# Patient Record
Sex: Female | Born: 1945 | Race: Black or African American | Hispanic: No | Marital: Married | Smoking: Never smoker
Health system: Southern US, Community
[De-identification: ages and names within clinical notes are randomized; demographics above are authoritative.]

## PROBLEM LIST (undated history)

## (undated) DIAGNOSIS — E785 Hyperlipidemia, unspecified: Secondary | ICD-10-CM

## (undated) DIAGNOSIS — Z90710 Acquired absence of both cervix and uterus: Secondary | ICD-10-CM

## (undated) DIAGNOSIS — E079 Disorder of thyroid, unspecified: Secondary | ICD-10-CM

## (undated) DIAGNOSIS — M1990 Unspecified osteoarthritis, unspecified site: Secondary | ICD-10-CM

## (undated) DIAGNOSIS — Z972 Presence of dental prosthetic device (complete) (partial): Secondary | ICD-10-CM

## (undated) DIAGNOSIS — I10 Essential (primary) hypertension: Secondary | ICD-10-CM

## (undated) HISTORY — DX: Acquired absence of both cervix and uterus: Z90.710

## (undated) HISTORY — DX: Disorder of thyroid, unspecified: E07.9

## (undated) HISTORY — DX: Essential (primary) hypertension: I10

## (undated) HISTORY — PX: ABDOMINAL HYSTERECTOMY: SHX81

## (undated) HISTORY — DX: Hyperlipidemia, unspecified: E78.5

---

## 2004-05-19 ENCOUNTER — Ambulatory Visit: Payer: Self-pay | Admitting: Unknown Physician Specialty

## 2005-05-20 ENCOUNTER — Ambulatory Visit: Payer: Self-pay | Admitting: Unknown Physician Specialty

## 2006-08-09 ENCOUNTER — Ambulatory Visit: Payer: Self-pay | Admitting: Unknown Physician Specialty

## 2007-09-21 ENCOUNTER — Ambulatory Visit: Payer: Self-pay | Admitting: Unknown Physician Specialty

## 2008-10-21 ENCOUNTER — Ambulatory Visit: Payer: Self-pay | Admitting: Unknown Physician Specialty

## 2009-09-24 ENCOUNTER — Ambulatory Visit: Payer: Self-pay | Admitting: Unknown Physician Specialty

## 2010-07-24 ENCOUNTER — Ambulatory Visit: Payer: Self-pay | Admitting: Unknown Physician Specialty

## 2010-08-24 ENCOUNTER — Other Ambulatory Visit: Payer: Self-pay | Admitting: Surgery

## 2010-08-24 DIAGNOSIS — E041 Nontoxic single thyroid nodule: Secondary | ICD-10-CM

## 2010-09-01 ENCOUNTER — Other Ambulatory Visit (HOSPITAL_COMMUNITY)
Admission: RE | Admit: 2010-09-01 | Discharge: 2010-09-01 | Disposition: A | Discharge status: Home / Self Care | Payer: Managed Care, Other (non HMO) | Source: Ambulatory Visit | Attending: Interventional Radiology | Admitting: Interventional Radiology

## 2010-09-01 ENCOUNTER — Ambulatory Visit
Admission: RE | Admit: 2010-09-01 | Discharge: 2010-09-01 | Disposition: A | Discharge status: Home / Self Care | Payer: Managed Care, Other (non HMO) | Source: Ambulatory Visit | Attending: Surgery | Admitting: Surgery

## 2010-09-01 ENCOUNTER — Other Ambulatory Visit: Payer: Self-pay | Admitting: Interventional Radiology

## 2010-09-01 DIAGNOSIS — E049 Nontoxic goiter, unspecified: Secondary | ICD-10-CM | POA: Insufficient documentation

## 2010-09-01 DIAGNOSIS — E041 Nontoxic single thyroid nodule: Secondary | ICD-10-CM

## 2010-09-21 ENCOUNTER — Ambulatory Visit (HOSPITAL_COMMUNITY)
Admission: RE | Admit: 2010-09-21 | Discharge: 2010-09-21 | Disposition: A | Discharge status: Home / Self Care | Payer: Managed Care, Other (non HMO) | Source: Ambulatory Visit | Attending: Surgery | Admitting: Surgery

## 2010-09-21 ENCOUNTER — Encounter (HOSPITAL_COMMUNITY): Payer: Managed Care, Other (non HMO)

## 2010-09-21 ENCOUNTER — Other Ambulatory Visit: Payer: Self-pay | Admitting: Surgery

## 2010-09-21 ENCOUNTER — Other Ambulatory Visit (HOSPITAL_COMMUNITY): Payer: Self-pay | Admitting: Surgery

## 2010-09-21 DIAGNOSIS — Z01818 Encounter for other preprocedural examination: Secondary | ICD-10-CM | POA: Insufficient documentation

## 2010-09-21 DIAGNOSIS — E041 Nontoxic single thyroid nodule: Secondary | ICD-10-CM

## 2010-09-21 DIAGNOSIS — Z01812 Encounter for preprocedural laboratory examination: Secondary | ICD-10-CM | POA: Insufficient documentation

## 2010-09-21 LAB — DIFFERENTIAL
Basophils Absolute: 0 10*3/uL (ref 0.0–0.1)
Basophils Absolute: 0 10*3/uL (ref 0.0–0.1)
Basophils Relative: 0 % (ref 0–1)
Basophils Relative: 0 % (ref 0–1)
Lymphocytes Relative: 36 % (ref 12–46)
Lymphocytes Relative: 36 % (ref 12–46)
Monocytes Absolute: 0.5 10*3/uL (ref 0.1–1.0)
Monocytes Absolute: 0.5 10*3/uL (ref 0.1–1.0)
Monocytes Relative: 8 % (ref 3–12)
Neutro Abs: 3.3 10*3/uL (ref 1.7–7.7)
Neutro Abs: 3.3 10*3/uL (ref 1.7–7.7)
Neutrophils Relative %: 55 % (ref 43–77)
Neutrophils Relative %: 55 % (ref 43–77)

## 2010-09-21 LAB — PROTIME-INR
INR: 1.01 (ref 0.00–1.49)
Prothrombin Time: 13.5 seconds (ref 11.6–15.2)

## 2010-09-21 LAB — URINALYSIS, ROUTINE W REFLEX MICROSCOPIC
Hgb urine dipstick: NEGATIVE
Nitrite: NEGATIVE
Specific Gravity, Urine: 1.02 (ref 1.005–1.030)
Urobilinogen, UA: 0.2 mg/dL (ref 0.0–1.0)
pH: 6.5 (ref 5.0–8.0)

## 2010-09-21 LAB — CBC
HCT: 42.5 % (ref 36.0–46.0)
Hemoglobin: 14.3 g/dL (ref 12.0–15.0)
MCHC: 33.6 g/dL (ref 30.0–36.0)
WBC: 6 10*3/uL (ref 4.0–10.5)

## 2010-09-21 LAB — BASIC METABOLIC PANEL
BUN: 10 mg/dL (ref 6–23)
Chloride: 103 mEq/L (ref 96–112)
Creatinine, Ser: 0.95 mg/dL (ref 0.4–1.2)
GFR calc Af Amer: >60 mL/min (ref >60)
GFR calc non Af Amer: 60 mL/min — ABNORMAL LOW (ref >60)

## 2010-09-25 ENCOUNTER — Ambulatory Visit (HOSPITAL_COMMUNITY)
Admission: RE | Admit: 2010-09-25 | Discharge: 2010-09-26 | Disposition: A | Discharge status: Home / Self Care | Payer: Managed Care, Other (non HMO) | Source: Ambulatory Visit | Attending: Surgery | Admitting: Surgery

## 2010-09-25 ENCOUNTER — Other Ambulatory Visit: Payer: Self-pay | Admitting: Surgery

## 2010-09-25 DIAGNOSIS — D34 Benign neoplasm of thyroid gland: Secondary | ICD-10-CM | POA: Insufficient documentation

## 2010-09-26 NOTE — Op Note (Signed)
Natasha Farrell, Natasha Farrell                ACCOUNT NO.:  [id number]  MEDICAL RECORD NO.:  [id number]           PATIENT TYPE:  O  LOCATION:  DAYL                         FACILITY:  [facility]  PHYSICIAN:  Velora Heckler, MD      DATE OF BIRTH:  01/10/1946  DATE OF PROCEDURE: 25 Sep 2010                               OPERATIVE REPORT   PREOPERATIVE DIAGNOSIS:  Right thyroid nodule.  POSTOPERATIVE DIAGNOSIS:  Right thyroid nodule.  PROCEDURE:  Right thyroid lobectomy.  SURGEON:  Velora Heckler, MD, FACS  ANESTHESIA:  General.  ESTIMATED BLOOD LOSS:  Minimal.  PREPARATION:  ChloraPrep.  COMPLICATIONS:  None.  INDICATIONS:  The patient is a 65 year old black female from [place], [place].  The patient had been diagnosed by Dr. Silver Huguenin with thyroid nodules.  There was a dominant right-sided thyroid nodule which, on ultrasound, measured 3.5 cm.  The patient underwent fine- needle aspiration biopsy with benign findings.  However, the patient notes mild compressive symptoms and would like to proceed with resection for definitive diagnosis and relief of compressive symptoms.  Therefore, the patient comes to the operating room for right thyroid lobectomy.  PROCEDURE IN DETAIL:  Procedure was done in OR #1 at the [facility].  The patient is brought to the operating room, placed in supine position on the operating room table.  Following administration of general anesthesia, the patient is positioned and then prepped and draped in usual strict aseptic fashion.  After ascertaining that an adequate level of anesthesia had been achieved, a Kocher incision was made with a #15 blade.  Dissection was carried through subcutaneous tissues and platysma.  Hemostasis was obtained with electrocautery.  Skin flaps were elevated cephalad and caudad with electrocautery used for hemostasis.  Mahorner self-retaining retractors placed for exposure.  Strap muscles were incised in  the midline.  Left thyroid lobe was palpated.  It appears grossly normal.  On palpation, there are no dominant nor discrete masses.  There is no lymphadenopathy.  Next, we turned our attention to the right side.  Strap muscles were reflected laterally exposing the right thyroid lobe.  Right lobe was moderately enlarged.  It contains a dominant nodule, occupying a large portion of the inferior and mid pole region of the gland.  It is gently mobilized.  Superior vessels were divided individually between medium Ligaclips with the harmonic scalpel.  Middle thyroid vein was divided between Ligaclips with the harmonic scalpel.  Inferior venous tributaries were divided between medium Ligaclips with the harmonic scalpel.  Gland is rolled anteriorly.  Parathyroid tissue was identified and preserved.  Branches of the inferior thyroid artery are divided between small Ligaclips with the harmonic scalpel.  Recurrent nerve was identified and preserved.  Ligament of Allyson Sabal was released with electrocautery and the gland was mobilized up and onto the anterior trachea.  Isthmus was mobilized across the midline.  There is no significant pyramidal lobe.  Thyroid parenchyma is transected with the harmonic scalpel at the junction of the isthmus and left thyroid lobe. Specimen is submitted to pathology for review, labeled right thyroid lobe.  Neck is irrigated with warm saline.  Good hemostasis was noted throughout.  Surgicel was placed in the operative field.  Strap muscles were reapproximated in the midline with interrupted 3-0 Vicryl sutures. Platysma was closed with interrupted 3-0 Vicryl sutures.  Skin was closed with running 4-0 Monocryl subcuticular suture.  Wound was washed and dried and Benzoin and Steri-Strips were applied.  Sterile dressings were applied.  The patient is awakened from anesthesia and brought to the recovery room.  The patient tolerated the procedure well.   Velora Heckler, MD,  FACS     TMG/MEDQ  D:  09/25/2010  T:  09/25/2010  Job:  [id number]  cc:   Dr. Silver Huguenin  Electronically Signed by Darnell Level MD on 09/26/2010 12:41:46 PM

## 2010-09-26 NOTE — Discharge Summary (Signed)
  Natasha Farrell, Natasha Farrell                ACCOUNT NO.:  [id number]  MEDICAL RECORD NO.:  [id number]           PATIENT TYPE:  O  LOCATION:  1527                         FACILITY:  [facility]  PHYSICIAN:  Velora Heckler, MD      DATE OF BIRTH:  14-Feb-1946  DATE OF ADMISSION:  09/25/2010 DATE OF DISCHARGE:  09/26/2010                              DISCHARGE SUMMARY   REASON FOR ADMISSION:  Right thyroid nodule.  BRIEF HISTORY:  The patient is a 66 year old black female from [place], [place].  The patient had been diagnosed by Dr. Rockie Neighbours with thyroid nodules.  Ultrasound showed a dominant right- sided nodule measuring 3.5 cm.  She was having mild compressive symptoms.  Fine-needle aspiration was benign.  The patient now comes to Surgery for right thyroid lobectomy.  HOSPITAL COURSE:  The patient was admitted on Sep 25, 2010, and taken directly to the operating room.  She underwent right thyroid lobectomy without complication.  Postoperative course was straightforward.  The patient had good pain control.  She was prepared for discharge home on the first postoperative day.  DISCHARGE/PLAN:  The patient is discharged home on Sep 26, 2010, in good condition, tolerating regular diet, and ambulating independently.  DISCHARGE MEDICATIONS:  Include Vicodin as needed for pain.  The patient will return to see me in the office at [facility] in 2-3 weeks.  FINAL DIAGNOSIS:  Right thyroid nodule, final pathologic results pending at the time of discharge.  CONDITION AT DISCHARGE:  Good.     Velora Heckler, MD     TMG/MEDQ  D:  09/26/2010  T:  09/26/2010  Job:  [id number]  cc:   Dr. Rockie Neighbours  Velora Heckler, MD [address] [place] [address]  Electronically Signed by Darnell Level MD on 09/26/2010 12:41:49 PM

## 2010-10-20 ENCOUNTER — Ambulatory Visit: Payer: Self-pay | Admitting: Unknown Physician Specialty

## 2010-11-03 ENCOUNTER — Other Ambulatory Visit (INDEPENDENT_AMBULATORY_CARE_PROVIDER_SITE_OTHER): Payer: Self-pay | Admitting: Surgery

## 2010-11-04 LAB — TSH: TSH: 6.538 u[IU]/mL — ABNORMAL HIGH (ref 0.350–4.500)

## 2010-12-09 ENCOUNTER — Encounter (INDEPENDENT_AMBULATORY_CARE_PROVIDER_SITE_OTHER): Payer: Self-pay | Admitting: Surgery

## 2010-12-09 ENCOUNTER — Ambulatory Visit (INDEPENDENT_AMBULATORY_CARE_PROVIDER_SITE_OTHER): Payer: Private Health Insurance - Indemnity | Admitting: Surgery

## 2010-12-09 DIAGNOSIS — E041 Nontoxic single thyroid nodule: Secondary | ICD-10-CM

## 2010-12-09 MED ORDER — SYNTHROID 50 MCG PO TABS: 50.0000 ug | ORAL_TABLET | Freq: Every day | ORAL | Status: DC

## 2010-12-09 NOTE — Progress Notes (Signed)
HISTORY: Patient is a 65 year old black female who underwent a thyroid lobectomy in May 2012. She has done well. A TSH level in June 2012 was slightly elevated at 6.538. She is not taking thyroid hormone supplementation.   PERTINENT REVIEW OF SYSTEMS: No complaints. Energy level is good. No dysphagia. Voice quality is normal.   EXAM: Neck incision is well healed with good cosmetic result. No sign of seromatous no sign of infection. Voice quality is normal.   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  Surgical hypothyroidism.   PLAN: Patient will be prescribed Synthroid 50 mcg daily. She will arrange for followup with her primary physician. She will need a repeat TSH level in approximately 6 weeks.  Otherwise the patient will continue to apply topical creams to her incision. She will return to see me as needed.

## 2011-10-26 ENCOUNTER — Ambulatory Visit: Payer: Self-pay | Admitting: Unknown Physician Specialty

## 2012-11-30 ENCOUNTER — Ambulatory Visit: Payer: Self-pay | Admitting: Unknown Physician Specialty

## 2013-12-03 ENCOUNTER — Ambulatory Visit: Payer: Self-pay | Admitting: Internal Medicine

## 2014-07-29 DIAGNOSIS — J3089 Other allergic rhinitis: Secondary | ICD-10-CM | POA: Insufficient documentation

## 2014-07-29 DIAGNOSIS — E890 Postprocedural hypothyroidism: Secondary | ICD-10-CM | POA: Insufficient documentation

## 2015-02-14 DIAGNOSIS — F5104 Psychophysiologic insomnia: Secondary | ICD-10-CM | POA: Insufficient documentation

## 2015-12-15 ENCOUNTER — Other Ambulatory Visit: Payer: Self-pay | Admitting: Internal Medicine

## 2015-12-15 DIAGNOSIS — Z1231 Encounter for screening mammogram for malignant neoplasm of breast: Secondary | ICD-10-CM

## 2015-12-30 ENCOUNTER — Ambulatory Visit
Admission: RE | Admit: 2015-12-30 | Discharge: 2015-12-30 | Disposition: A | Discharge status: Home / Self Care | Payer: Medicare Other | Source: Ambulatory Visit | Attending: Internal Medicine | Admitting: Internal Medicine

## 2015-12-30 ENCOUNTER — Other Ambulatory Visit: Payer: Self-pay | Admitting: Internal Medicine

## 2015-12-30 DIAGNOSIS — Z1231 Encounter for screening mammogram for malignant neoplasm of breast: Secondary | ICD-10-CM

## 2016-08-02 DIAGNOSIS — Z780 Asymptomatic menopausal state: Secondary | ICD-10-CM | POA: Insufficient documentation

## 2016-08-02 DIAGNOSIS — R7303 Prediabetes: Secondary | ICD-10-CM | POA: Insufficient documentation

## 2016-11-19 ENCOUNTER — Other Ambulatory Visit: Payer: Self-pay | Admitting: Internal Medicine

## 2016-11-19 DIAGNOSIS — Z1231 Encounter for screening mammogram for malignant neoplasm of breast: Secondary | ICD-10-CM

## 2016-12-30 ENCOUNTER — Ambulatory Visit
Admission: RE | Admit: 2016-12-30 | Discharge: 2016-12-30 | Disposition: A | Discharge status: Home / Self Care | Payer: Medicare HMO | Source: Ambulatory Visit | Attending: Internal Medicine | Admitting: Internal Medicine

## 2016-12-30 DIAGNOSIS — Z1231 Encounter for screening mammogram for malignant neoplasm of breast: Secondary | ICD-10-CM | POA: Diagnosis not present

## 2017-08-04 ENCOUNTER — Other Ambulatory Visit: Payer: Self-pay | Admitting: Internal Medicine

## 2017-08-04 DIAGNOSIS — Z1239 Encounter for other screening for malignant neoplasm of breast: Secondary | ICD-10-CM

## 2017-12-06 DIAGNOSIS — I10 Essential (primary) hypertension: Secondary | ICD-10-CM | POA: Insufficient documentation

## 2018-01-03 ENCOUNTER — Ambulatory Visit
Admission: RE | Admit: 2018-01-03 | Discharge: 2018-01-03 | Disposition: A | Discharge status: Home / Self Care | Payer: Medicare HMO | Source: Ambulatory Visit | Attending: Internal Medicine | Admitting: Internal Medicine

## 2018-01-03 DIAGNOSIS — Z1231 Encounter for screening mammogram for malignant neoplasm of breast: Secondary | ICD-10-CM | POA: Insufficient documentation

## 2018-01-03 DIAGNOSIS — Z1239 Encounter for other screening for malignant neoplasm of breast: Secondary | ICD-10-CM

## 2018-01-05 DIAGNOSIS — E269 Hyperaldosteronism, unspecified: Secondary | ICD-10-CM | POA: Insufficient documentation

## 2018-01-05 DIAGNOSIS — I152 Hypertension secondary to endocrine disorders: Secondary | ICD-10-CM | POA: Insufficient documentation

## 2018-01-11 ENCOUNTER — Other Ambulatory Visit: Payer: Self-pay | Admitting: Internal Medicine

## 2018-01-11 DIAGNOSIS — E269 Hyperaldosteronism, unspecified: Secondary | ICD-10-CM

## 2018-01-23 ENCOUNTER — Ambulatory Visit
Admission: RE | Admit: 2018-01-23 | Discharge: 2018-01-23 | Disposition: A | Discharge status: Home / Self Care | Payer: Medicare HMO | Source: Ambulatory Visit | Attending: Internal Medicine | Admitting: Internal Medicine

## 2018-01-23 DIAGNOSIS — E269 Hyperaldosteronism, unspecified: Secondary | ICD-10-CM | POA: Insufficient documentation

## 2018-01-23 DIAGNOSIS — N200 Calculus of kidney: Secondary | ICD-10-CM | POA: Diagnosis not present

## 2018-01-23 DIAGNOSIS — I700 Atherosclerosis of aorta: Secondary | ICD-10-CM | POA: Insufficient documentation

## 2018-08-07 ENCOUNTER — Other Ambulatory Visit: Payer: Self-pay | Admitting: Internal Medicine

## 2018-08-07 DIAGNOSIS — Z1231 Encounter for screening mammogram for malignant neoplasm of breast: Secondary | ICD-10-CM

## 2018-12-13 DIAGNOSIS — Z800 Family history of malignant neoplasm of digestive organs: Secondary | ICD-10-CM | POA: Insufficient documentation

## 2019-02-12 ENCOUNTER — Other Ambulatory Visit: Payer: Self-pay | Admitting: Physician Assistant

## 2019-02-12 DIAGNOSIS — Z1231 Encounter for screening mammogram for malignant neoplasm of breast: Secondary | ICD-10-CM

## 2019-03-27 ENCOUNTER — Ambulatory Visit
Admission: RE | Admit: 2019-03-27 | Discharge: 2019-03-27 | Disposition: A | Discharge status: Home / Self Care | Payer: Medicare HMO | Source: Ambulatory Visit | Attending: Physician Assistant | Admitting: Physician Assistant

## 2019-03-27 DIAGNOSIS — Z1231 Encounter for screening mammogram for malignant neoplasm of breast: Secondary | ICD-10-CM | POA: Diagnosis present

## 2020-02-06 ENCOUNTER — Other Ambulatory Visit: Payer: Self-pay | Admitting: Physician Assistant

## 2020-02-06 DIAGNOSIS — Z1231 Encounter for screening mammogram for malignant neoplasm of breast: Secondary | ICD-10-CM

## 2020-03-27 ENCOUNTER — Other Ambulatory Visit: Payer: Self-pay

## 2020-03-27 ENCOUNTER — Ambulatory Visit
Admission: RE | Admit: 2020-03-27 | Discharge: 2020-03-27 | Disposition: A | Discharge status: Home / Self Care | Payer: Medicare HMO | Source: Ambulatory Visit | Attending: Physician Assistant | Admitting: Physician Assistant

## 2020-03-27 DIAGNOSIS — Z1231 Encounter for screening mammogram for malignant neoplasm of breast: Secondary | ICD-10-CM | POA: Insufficient documentation

## 2020-04-03 ENCOUNTER — Other Ambulatory Visit: Payer: Self-pay | Admitting: Physician Assistant

## 2020-04-03 DIAGNOSIS — R928 Other abnormal and inconclusive findings on diagnostic imaging of breast: Secondary | ICD-10-CM

## 2020-04-07 ENCOUNTER — Other Ambulatory Visit: Payer: Self-pay

## 2020-04-07 ENCOUNTER — Ambulatory Visit
Admission: RE | Admit: 2020-04-07 | Discharge: 2020-04-07 | Disposition: A | Discharge status: Home / Self Care | Payer: Medicare HMO | Source: Ambulatory Visit | Attending: Physician Assistant | Admitting: Physician Assistant

## 2020-04-07 DIAGNOSIS — R928 Other abnormal and inconclusive findings on diagnostic imaging of breast: Secondary | ICD-10-CM | POA: Insufficient documentation

## 2020-04-15 ENCOUNTER — Other Ambulatory Visit: Payer: Self-pay | Admitting: Physician Assistant

## 2020-04-15 DIAGNOSIS — R928 Other abnormal and inconclusive findings on diagnostic imaging of breast: Secondary | ICD-10-CM

## 2020-04-21 ENCOUNTER — Ambulatory Visit
Admission: RE | Admit: 2020-04-21 | Discharge: 2020-04-21 | Disposition: A | Discharge status: Home / Self Care | Payer: Medicare HMO | Source: Ambulatory Visit | Attending: Physician Assistant | Admitting: Physician Assistant

## 2020-04-21 ENCOUNTER — Other Ambulatory Visit: Payer: Self-pay

## 2020-04-21 DIAGNOSIS — R928 Other abnormal and inconclusive findings on diagnostic imaging of breast: Secondary | ICD-10-CM | POA: Diagnosis present

## 2020-04-21 HISTORY — PX: BREAST BIOPSY: SHX20

## 2020-04-24 ENCOUNTER — Encounter: Payer: Self-pay | Admitting: Diagnostic Radiology

## 2020-04-24 LAB — SURGICAL PATHOLOGY

## 2020-05-24 DIAGNOSIS — C8290 Follicular lymphoma, unspecified, unspecified site: Secondary | ICD-10-CM

## 2020-05-24 HISTORY — DX: Follicular lymphoma, unspecified, unspecified site: C82.90

## 2020-07-21 ENCOUNTER — Emergency Department
Admission: EM | Admit: 2020-07-21 | Discharge: 2020-07-21 | Disposition: A | Discharge status: Home / Self Care | Payer: Medicare HMO | Attending: Emergency Medicine | Admitting: Emergency Medicine

## 2020-07-21 ENCOUNTER — Emergency Department: Payer: Medicare HMO

## 2020-07-21 ENCOUNTER — Other Ambulatory Visit: Payer: Self-pay

## 2020-07-21 DIAGNOSIS — Z79899 Other long term (current) drug therapy: Secondary | ICD-10-CM | POA: Diagnosis not present

## 2020-07-21 DIAGNOSIS — I10 Essential (primary) hypertension: Secondary | ICD-10-CM | POA: Insufficient documentation

## 2020-07-21 DIAGNOSIS — Z20822 Contact with and (suspected) exposure to covid-19: Secondary | ICD-10-CM | POA: Diagnosis not present

## 2020-07-21 DIAGNOSIS — R630 Anorexia: Secondary | ICD-10-CM | POA: Insufficient documentation

## 2020-07-21 DIAGNOSIS — R590 Localized enlarged lymph nodes: Secondary | ICD-10-CM

## 2020-07-21 DIAGNOSIS — E860 Dehydration: Secondary | ICD-10-CM | POA: Insufficient documentation

## 2020-07-21 DIAGNOSIS — R591 Generalized enlarged lymph nodes: Secondary | ICD-10-CM

## 2020-07-21 DIAGNOSIS — R531 Weakness: Secondary | ICD-10-CM

## 2020-07-21 DIAGNOSIS — R079 Chest pain, unspecified: Secondary | ICD-10-CM | POA: Diagnosis not present

## 2020-07-21 DIAGNOSIS — R000 Tachycardia, unspecified: Secondary | ICD-10-CM | POA: Insufficient documentation

## 2020-07-21 LAB — RESP PANEL BY RT-PCR (FLU A&B, COVID) ARPGX2
Influenza A by PCR: NEGATIVE
Influenza B by PCR: NEGATIVE
SARS Coronavirus 2 by RT PCR: NEGATIVE

## 2020-07-21 LAB — URINALYSIS, COMPLETE (UACMP) WITH MICROSCOPIC
Bacteria, UA: NONE SEEN
Bilirubin Urine: NEGATIVE
Glucose, UA: NEGATIVE mg/dL
Hgb urine dipstick: NEGATIVE
Ketones, ur: NEGATIVE mg/dL
Leukocytes,Ua: NEGATIVE
Nitrite: NEGATIVE
Protein, ur: 100 mg/dL — AB
Specific Gravity, Urine: 1.017 (ref 1.005–1.030)
pH: 5 (ref 5.0–8.0)

## 2020-07-21 LAB — BASIC METABOLIC PANEL
Anion gap: 12 (ref 5–15)
BUN: 9 mg/dL (ref 8–23)
CO2: 23 mmol/L (ref 22–32)
Calcium: 10.2 mg/dL (ref 8.9–10.3)
Chloride: 99 mmol/L (ref 98–111)
Creatinine, Ser: 0.93 mg/dL (ref 0.44–1.00)
GFR, Estimated: >60 mL/min (ref >60)
Glucose, Bld: 99 mg/dL (ref 70–99)
Potassium: 3.8 mmol/L (ref 3.5–5.1)
Sodium: 134 mmol/L — ABNORMAL LOW (ref 135–145)

## 2020-07-21 LAB — CBC
HCT: 38.1 % (ref 36.0–46.0)
Hemoglobin: 12.9 g/dL (ref 12.0–15.0)
MCH: 29.2 pg (ref 26.0–34.0)
MCHC: 33.9 g/dL (ref 30.0–36.0)
MCV: 86.2 fL (ref 80.0–100.0)
Platelets: 68 10*3/uL — ABNORMAL LOW (ref 150–400)
RBC: 4.42 MIL/uL (ref 3.87–5.11)
RDW: 15.1 % (ref 11.5–15.5)
WBC: 8.8 10*3/uL (ref 4.0–10.5)
nRBC: 0 % (ref 0.0–0.2)

## 2020-07-21 LAB — TROPONIN I (HIGH SENSITIVITY)
Troponin I (High Sensitivity): 5 ng/L (ref <18)
Troponin I (High Sensitivity): 5 ng/L (ref <18)

## 2020-07-21 LAB — T4, FREE: Free T4: 1.25 ng/dL — ABNORMAL HIGH (ref 0.61–1.12)

## 2020-07-21 MED ORDER — SODIUM CHLORIDE 0.9 % IV BOLUS
1000.0000 mL | Freq: Once | INTRAVENOUS | Status: AC
  Administered 2020-07-21: 1000 mL via INTRAVENOUS

## 2020-07-21 MED ORDER — IOHEXOL 350 MG/ML SOLN
75.0000 mL | Freq: Once | INTRAVENOUS | Status: AC | PRN
  Administered 2020-07-21: 75 mL via INTRAVENOUS

## 2020-07-21 NOTE — ED Provider Notes (Signed)
3:13 PM Assumed care for off going team.   Blood pressure (!) 143/73, pulse 97, temperature 98.3 F (36.8 C), temperature source Oral, resp. rate 18, height 5\' 4"  (1.626 m), weight 79.8 kg, SpO2 95 %.  See their HPI for full report but in brief patient came in with tachycardia pending CT PE.  If negative will discharge home  Dr. Cheri Fowler updated pt on CT results concerning for lymphoma and dc with onc follow up.        Vanessa Hamlet, MD 07/21/20 [phone]

## 2020-07-21 NOTE — Discharge Instructions (Addendum)
Your CT scan was as below.  Due to these findings you should follow-up with an oncologist.  Please call them to make an appointment to have this further worked up. Also please let your primary doctor know to help coordinate care with the oncology team.   Return to ER for fevers, shortness of breath or any other concerns     IMPRESSION:  1. Extensive lymphadenopathy throughout the neck, chest, and  visualized portions of the abdomen, along with marked splenomegaly.  Overall, findings favor leukemia or lymphoma.  2. No evidence of pulmonary embolus.  3. Small left pleural effusion.  4.  Aortic Atherosclerosis (ICD10-I70.0).

## 2020-07-21 NOTE — ED Notes (Signed)
IV access obtained, CT called

## 2020-07-21 NOTE — ED Notes (Signed)
IV catheter's removed intact without complication.  D/C instructions given.  Advised of follow up.  All questions addressed.  Understanding verbalized.  Pt left ER ambulatory with spouse.

## 2020-07-21 NOTE — ED Triage Notes (Signed)
Pt c/o generalized weakness and loss of appetite, states she feels full  For the past 3 days, states she does having some chest pain with exertion. Pt is in NAD at present

## 2020-07-21 NOTE — ED Provider Notes (Signed)
[facility] Emergency Department Provider Note   ____________________________________________   Event Date/Time   First MD Initiated Contact with Patient 07/21/20 1200     (approximate)  I have reviewed the triage vital signs and the nursing notes.   HISTORY  Chief Complaint Weakness    HPI [name] is a 75 y.o. female with medical history of hypertension, thyroid disease, and hyperlipidemia who presents for episodes of generalized weakness, palpitations, and diaphoresis that have been present over the last month.  Patient states that she has been evaluated by her primary care doctor who has only found evidence of sinus tachycardia on multiple visits.  This tachycardia was improved with IV hydration and therefore deemed to likely be dehydration as a cause of her episodic lightheadedness.  Patient was found to be orthostatic in the past as well.  Patient denies any past medical history of abnormal heart rhythms.  Patient denies any chest pains, palpitations, or diaphoresis at this time.  Patient currently denies any vision changes, tinnitus, difficulty speaking, facial droop, sore throat, chest pain, shortness of breath, abdominal pain, nausea/vomiting/diarrhea, dysuria, or weakness/numbness/paresthesias in any extremity         Past Medical History:  Diagnosis Date  . Hx of hysterectomy   . Hyperlipidemia   . Hypertension   . Thyroid disease    rt thryroidectomy  09/25/2010    Patient Active Problem List   Diagnosis Date Noted  . Thyroid nodule, uninodular 12/09/2010    Past Surgical History:  Procedure Laterality Date  . BREAST BIOPSY Right 04/21/2020   hydromark 3 [place] bx path pending    Prior to Admission medications   Medication Sig Start Date End Date Taking? Authorizing Provider  ezetimibe (ZETIA) 10 MG tablet Take 10 mg by mouth daily.      [provider]  lisinopril-hydrochlorothiazide (PRINZIDE,ZESTORETIC) 10-12.5 MG per  tablet Take 1 tablet by mouth daily.      [provider]  metoprolol (TOPROL-XL) 50 MG 24 hr tablet Take 50 mg by mouth daily.      [provider]  Multiple Vitamins-Minerals (MULTIVITAMIN WITH MINERALS) tablet Take 1 tablet by mouth daily.      [provider]  pravastatin (PRAVACHOL) 40 MG tablet Take 40 mg by mouth daily.      [provider]  SYNTHROID 50 MCG tablet Take 1 tablet (50 mcg total) by mouth daily. 12/09/10 12/09/11  Natasha Gemma, MD    Allergies Lisinopril  Family History  Problem Relation Age of Onset  . Breast cancer Sister 63    Social History Social History   Tobacco Use  . Smoking status: Never Smoker  . Smokeless tobacco: Never Used  Substance Use Topics  . Alcohol use: No  . Drug use: No    Review of Systems Constitutional: No fever/chills Eyes: No visual changes. ENT: No sore throat. Cardiovascular: Denies chest pain. Respiratory: Denies shortness of breath. Gastrointestinal: No abdominal pain.  No nausea, no vomiting.  No diarrhea. Genitourinary: Negative for dysuria. Musculoskeletal: Negative for acute arthralgias Skin: Negative for rash. Neurological: Negative for headaches, numbness/paresthesias in any extremity Psychiatric: Negative for suicidal ideation/homicidal ideation   ____________________________________________   PHYSICAL EXAM:  VITAL SIGNS: ED Triage Vitals [07/21/20 1108]  Enc Vitals Group     BP (!) 177/92     Pulse Rate (!) 135     Resp 18     Temp 98.3 F (36.8 C)     Temp Source Oral  SpO2 96 %     Weight 176 lb (79.8 kg)     Height 5\' 4"  (1.626 m)     Head Circumference      Peak Flow      Pain Score 0     Pain Loc      Pain Edu?      Excl. in [place]?    Constitutional: Alert and oriented. Well appearing overweight African-American female in no acute distress. Eyes: Conjunctivae are normal. PERRL. Head: Atraumatic. Nose: No congestion/rhinnorhea. Mouth/Throat: Mucous  membranes are moist. Neck: No stridor Cardiovascular: Grossly normal heart sounds.  Good peripheral circulation. Respiratory: Normal respiratory effort.  No retractions. Gastrointestinal: Soft and nontender. No distention. Musculoskeletal: No obvious deformities Neurologic:  Normal speech and language. No gross focal neurologic deficits are appreciated. Skin:  Skin is warm and dry. No rash noted. Psychiatric: Mood and affect are normal. Speech and behavior are normal.  ____________________________________________   LABS (all labs ordered are listed, but only abnormal results are displayed)  Labs Reviewed  BASIC METABOLIC PANEL - Abnormal; Notable for the following components:      Result Value   Sodium 134 (*)    All other components within normal limits  CBC - Abnormal; Notable for the following components:   Platelets 68 (*)    All other components within normal limits  URINALYSIS, COMPLETE (UACMP) WITH MICROSCOPIC - Abnormal; Notable for the following components:   Color, Urine YELLOW (*)    APPearance HAZY (*)    Protein, ur 100 (*)    All other components within normal limits  T4, FREE - Abnormal; Notable for the following components:   Free T4 1.25 (*)    All other components within normal limits  RESP PANEL BY RT-PCR (FLU A&B, COVID) ARPGX2  CBG MONITORING, ED  TROPONIN I (HIGH SENSITIVITY)  TROPONIN I (HIGH SENSITIVITY)   ____________________________________________  EKG  ED ECG REPORT I, Naaman Plummer, the attending physician, personally viewed and interpreted this ECG.  Date: 07/21/2020 EKG Time: 1059 Rate: 136 Rhythm: Tachycardic sinus rhythm QRS Axis: normal Intervals: normal ST/T Wave abnormalities: normal Narrative Interpretation: no evidence of acute ischemia  ____________________________________________  RADIOLOGY  ED MD interpretation: CT angiography of the chest shows multiple enlarged lymph nodes in the neck and chest  Official radiology  report(s): CT Angio Chest PE W/Cm &/Or Wo Cm  Result Date: 07/21/2020 CLINICAL DATA:  Generalized weakness, loss of appetite, chest pain with exertion EXAM: CT ANGIOGRAPHY CHEST WITH CONTRAST TECHNIQUE: Multidetector CT imaging of the chest was performed using the standard protocol during bolus administration of intravenous contrast. Multiplanar CT image reconstructions and MIPs were obtained to evaluate the vascular anatomy. CONTRAST:  59mL OMNIPAQUE IOHEXOL 350 MG/ML SOLN COMPARISON:  None. FINDINGS: Cardiovascular: This is a technically adequate evaluation of the pulmonary vasculature. There are no filling defects or pulmonary emboli. The heart is unremarkable without pericardial effusion. Unremarkable thoracic aorta without aneurysm or dissection. Minimal atherosclerosis. Mediastinum/Nodes: There is pathologic adenopathy within the bilateral jugular chains, mediastinum, bilateral axilla. Largest lymph node in the right axilla measures 21 x 16 mm image 24/4, and in the left axilla measures 30 x 15 mm image 11/4. Enlarged lymph node interposed between the brachiocephalic vein and left subclavian artery measures 21 x 16 mm reference image 13 of series 4. The thyroid, trachea, and esophagus are unremarkable. Lungs/Pleura: There is a small left pleural effusion. Minimal dependent atelectasis is seen within the lower lobes bilaterally, left greater than right. No  acute airspace disease or pneumothorax. The central airways are patent. Upper Abdomen: There is massive splenomegaly. Pathologic adenopathy is seen within the central upper mesentery and retroperitoneum, incompletely evaluated on this study. Epiphrenic adenopathy also noted anterior to the bilateral hemidiaphragms. Musculoskeletal: No acute or destructive bony lesions. Reconstructed images demonstrate no additional findings. Review of the MIP images confirms the above findings. IMPRESSION: 1. Extensive lymphadenopathy throughout the neck, chest, and  visualized portions of the abdomen, along with marked splenomegaly. Overall, findings favor leukemia or lymphoma. 2. No evidence of pulmonary embolus. 3. Small left pleural effusion. 4.  Aortic Atherosclerosis (ICD10-I70.0). Electronically Signed   By: Randa Ngo M.D.   On: 07/21/2020 15:25    ____________________________________________   PROCEDURES  Procedure(s) performed (including Critical Care):  .1-3 Lead EKG Interpretation Performed by: Naaman Plummer, MD Authorized by: Naaman Plummer, MD     Interpretation: normal     ECG rate:  98   ECG rate assessment: normal     Rhythm: sinus rhythm     Ectopy: none     Conduction: normal       ____________________________________________   INITIAL IMPRESSION / ASSESSMENT AND PLAN / ED COURSE  As part of my medical decision making, I reviewed the following data within the [facility] notes reviewed and incorporated, Labs reviewed, EKG interpreted, Old chart reviewed, Radiograph reviewed and Notes from prior ED visits reviewed and incorporated        This patient presents with generalized weakness and fatigue likely secondary to dehydration. Suspect acute kidney injury of prerenal origin. Doubt intrinsic renal dysfunction or obstructive nephropathy. Considered alternate etiologies of the patients symptoms including infectious processes, severe metabolic derangements or electrolyte abnormalities, ischemia/ACS, heart failure, and intracranial/central processes but think these are unlikely given the history and physical exam.  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 This was discussed with patient who stated that she had a lymph node biopsy performed in the right axilla within the last year and is scheduled for follow-up within this next week for multiple other lymph node biopsies.  The patient has been reexamined and is ready to be discharged.  All  diagnostic results have been reviewed and discussed with the patient/family.  Care plan has been outlined and the patient/family understands all current diagnoses, results, and treatment plans.  There are no new complaints, changes, or physical findings at this time.  All questions have been addressed and answered.  Patient was instructed to, and agrees to follow-up with their primary care physician as well as return to the emergency department if any new or worsening symptoms develop.      ____________________________________________   FINAL CLINICAL IMPRESSION(S) / ED DIAGNOSES  Final diagnoses:  Generalized weakness  Dehydration  Tachycardia  Lymphadenopathy  Thoracic lymphadenopathy     ED Discharge Orders    None       Note:  This document was prepared using Dragon voice recognition software and may include unintentional dictation errors.   Naaman Plummer, MD 07/22/20 1400

## 2020-07-23 ENCOUNTER — Ambulatory Visit: Payer: Medicare HMO | Admitting: Internal Medicine

## 2020-07-23 ENCOUNTER — Other Ambulatory Visit: Payer: Self-pay

## 2020-07-23 ENCOUNTER — Encounter: Payer: Self-pay | Admitting: Internal Medicine

## 2020-07-23 VITALS — BP 156/60 | HR 139 | Ht 64.0 in | Wt 175.0 lb

## 2020-07-23 DIAGNOSIS — R000 Tachycardia, unspecified: Secondary | ICD-10-CM | POA: Diagnosis not present

## 2020-07-23 DIAGNOSIS — R0602 Shortness of breath: Secondary | ICD-10-CM | POA: Diagnosis not present

## 2020-07-23 DIAGNOSIS — R1012 Left upper quadrant pain: Secondary | ICD-10-CM | POA: Insufficient documentation

## 2020-07-23 DIAGNOSIS — R079 Chest pain, unspecified: Secondary | ICD-10-CM

## 2020-07-23 DIAGNOSIS — I10 Essential (primary) hypertension: Secondary | ICD-10-CM

## 2020-07-23 MED ORDER — CARVEDILOL 12.5 MG PO TABS: 12.5000 mg | ORAL_TABLET | Freq: Two times a day (BID) | ORAL | 3 refills | Status: DC

## 2020-07-23 NOTE — Progress Notes (Signed)
New Outpatient Visit Date: 07/23/2020  Primary Care provider: Glendon Axe, MD No address on file  Chief Complaint: Elevated heart rate  HPI:  Ms. Balash is a 75 y.o. female who is being seen today for the evaluation of tachycardia. She has a history of hypertension, hyperlipidemia, and thyroid disease status post right thyroidectomy (2012). She presented to the [facility] emergency department for evaluation of tachycardia dating back to early January when she also complained of 4 days of headache, fevers, chills, cough, fatigue, night sweats, and dizziness. In the ED earlier this week, CT of the chest showed widespread lymphadenopathy in the neck, chest, and visualized portions of the upper abdomen along with marked splenomegaly. Findings were concerning for lymphoma or leukemia. EKG showed sinus tachycardia.  She was advised to follow-up with a cardiologist.  Today, Ms. Macioce reports that she has felt elevated heart rates, especially when she is activating or lying in bed at night, dating back to early January.  She frequently feels short of breath with mild activity, which became quite pronounced on September 06, 2022 (3 days ago) and led to her ED visit 2 days ago.  She frequently feels faint and lightheaded but has not passed out.  She also has occasional tightness in her chest especially when she moves around.  She notes having been placed on metoprolol over a year ago.  Her PCP had tried to escalate this up to 150 mg daily, though Ms. Focht ultimately asked that it be reduced back to 50 mg daily due to worsening fatigue.  She denies prior cardiac disease and testing.  She is scheduled for an ultrasound of her axilla tomorrow for work-up of axillary lymphadenopathy.  She is also scheduled for consultation with Dr. Mike Gip (oncology) next week.  --------------------------------------------------------------------------------------------------  Cardiovascular History & Procedures: Cardiovascular  Problems:  Sinus tachycardia  Risk Factors:  Hypertension, hyperlipidemia  Cath/PCI:  None  CV Surgery:  None  EP Procedures and Devices:  None  Non-Invasive Evaluation(s):  None  Recent CV Pertinent Labs: Lab Results  Component Value Date   INR 1.01 09/21/2010   K 3.8 07/21/2020   BUN 9 07/21/2020   CREATININE 0.93 07/21/2020    --------------------------------------------------------------------------------------------------  Past Medical History:  Diagnosis Date  . Hx of hysterectomy   . Hyperlipidemia   . Hypertension   . Thyroid disease    rt thryroidectomy  09/25/2010    Past Surgical History:  Procedure Laterality Date  . BREAST BIOPSY Right 04/21/2020   hydromark 3 [place] bx path pending    Current Meds  Medication Sig  . amLODipine (NORVASC) 10 MG tablet Take 1 tablet by mouth daily.  . cholecalciferol (VITAMIN D3) 25 MCG (1000 UNIT) tablet Take 2,000 Units by mouth daily.  . metoprolol (TOPROL-XL) 50 MG 24 hr tablet Take 50 mg by mouth daily.  . Multiple Vitamins-Minerals (MULTIVITAMIN WITH MINERALS) tablet Take 1 tablet by mouth daily.  . pravastatin (PRAVACHOL) 40 MG tablet Take 40 mg by mouth daily.  Marland Kitchen spironolactone (ALDACTONE) 25 MG tablet Take 25 mg by mouth daily.  Marland Kitchen SYNTHROID 50 MCG tablet Take 1 tablet (50 mcg total) by mouth daily.    Allergies: Lisinopril  Social History   Tobacco Use  . Smoking status: Never Smoker  . Smokeless tobacco: Never Used  Vaping Use  . Vaping Use: Never used  Substance Use Topics  . Alcohol use: No  . Drug use: No    Family History  Problem Relation Age of Onset  . Breast  cancer Sister 15    Review of Systems: Patient has experienced intermittent left upper quadrant pain since this morning.  Otherwise, a 12-system review of systems was performed and was negative except as noted in the  HPI.  --------------------------------------------------------------------------------------------------  Physical Exam: BP (!) 156/60 (BP Location: Right Arm, Patient Position: Sitting, Cuff Size: Normal)   Pulse (!) 139   Ht 5\' 4"  (1.626 m)   Wt 175 lb (79.4 kg)   SpO2 98%   BMI 30.04 kg/m   General: Anxious appearing woman, seated in the exam room. HEENT: No conjunctival pallor or scleral icterus. Facemask in place. Neck: Mild cervical lymphadenopathy noted.  No JVD or HJR.  No carotid bruit. Lungs: Mild increased work of breathing especially when talking.. Clear to auscultation bilaterally without wheezes or crackles. Heart: Tachycardic but regular rhythm without murmurs, rubs, or gallops. Abd: Bowel sounds present.  Soft with left upper quadrant tenderness.  Difficult to assess HSM due to tenderness and guarding. Ext: No lower extremity edema. Radial, PT, and DP pulses are 2+ bilaterally Skin: Warm and dry without rash. Neuro: CNIII-XII intact. Strength and fine-touch sensation intact in upper and lower extremities bilaterally. Psych: Normal mood and affect.  EKG: Sinus tachycardia (heart rate 139 bpm) with left atrial enlargement and nonspecific ST segment changes.  No significant change from prior tracing on 07/21/2020.  Lab Results  Component Value Date   WBC 8.8 07/21/2020   HGB 12.9 07/21/2020   HCT 38.1 07/21/2020   MCV 86.2 07/21/2020   PLT 68 (L) 07/21/2020    Lab Results  Component Value Date   NA 134 (L) 07/21/2020   K 3.8 07/21/2020   CL 99 07/21/2020   CO2 23 07/21/2020   BUN 9 07/21/2020   CREATININE 0.93 07/21/2020   GLUCOSE 99 07/21/2020   Lab Results  Component Value Date   TSH 6.538 (H) 11/03/2010   Free T4 (07/21/2020): 1.25  --------------------------------------------------------------------------------------------------  ASSESSMENT AND PLAN: Sinus tachycardia: Reviewing the patient's chart, she has had fairly significant tachycardia  dating back to her PCP visit in early January.  EKG today again shows sinus tachycardia with a heart rate of 139 bpm.  We discussed the nature of sinus tachycardia and the fact that it is likely a physiologic response to some other stressor.  I am most concerned that her suspected malignancy may be driving this.  She also endorses chest pain and shortness of breath that may be a manifestation of cardiomyopathy though she does not appear significantly volume overloaded.  Given her persistent tachycardia and increased work of breathing when speaking, I suggested that Ms. Dade go to the emergency department for further evaluation.  She declined.  We will arrange for expedited echocardiogram tomorrow.  We have also agreed to transition from metoprolol succinate 50 mg daily to carvedilol 12.5 mg twice daily, as she did not tolerate higher doses of metoprolol in the past due to marked fatigue.  Free T4 noted to be mildly elevated on 07/21/2020, though degree of tachycardia seems out of proportion to her free T4.  Chest pain and shortness of breath: I suspect the symptoms are driven primarily by likely underlying malignancy and persistent sinus tachycardia.  As above, we will obtain an echocardiogram tomorrow.  Barring any significant structural abnormalities, I would defer ischemia work-up until after further work-up and management of her lymphadenopathy and splenomegaly have been completed.  Abdominal pain: Left upper quadrant pain and tenderness noted today in the setting of significant splenomegaly on recent  CTA.  I advised Ms. Simoneaux to go to the ER if she has worsening pain.  Hypertension: Blood pressure suboptimally controlled today.  As above, we will transition from metoprolol to carvedilol.  No other medication changes at this time.  Follow-up: Return to clinic in 2 weeks.  Nelva Bush, MD 07/23/2020 2:11 PM

## 2020-07-23 NOTE — Patient Instructions (Addendum)
If you experience worsening shortness of breath, belly pain or fatigue, you should go to the emergency room.   Medication Instructions:  Your physician has recommended you make the following change in your medication:  1- STOP Metoprolol. 2- START Carvedilol 12.5 mg by  Mouth two times a day.  *If you need a refill on your cardiac medications before your next appointment, please call your pharmacy*  Lab Work: none If you have labs (blood work) drawn today and your tests are completely normal, you will receive your results only by: Marland Kitchen MyChart Message (if you have MyChart) OR . A paper copy in the mail If you have any lab test that is abnormal or we need to change your treatment, we will call you to review the results.   Testing/Procedures:  Scheduled for 07/24/2020.  Arrive at 12:30 at the [facility].   Your physician has requested that you have an echocardiogram. Echocardiography is a painless test that uses sound waves to create images of your heart. It provides your doctor with information about the size and shape of your heart and how well your heart's chambers and valves are working. This procedure takes approximately one hour. There are no restrictions for this procedure. There is a possibility that an IV may need to be started during your test to inject an image enhancing agent. This is done to obtain more optimal pictures of your heart. Therefore we ask that you do at least drink some water prior to coming in to hydrate your veins.    Follow-Up: At [facility], you and your health needs are our priority.  As part of our continuing mission to provide you with exceptional heart care, we have created designated Provider Care Teams.  These Care Teams include your primary Cardiologist (physician) and Advanced Practice Providers (APPs -  Physician Assistants and Nurse Practitioners) who all work together to provide you with the care you need, when you need it.  We recommend  signing up for the patient portal called "MyChart".  Sign up information is provided on this After Visit Summary.  MyChart is used to connect with patients for Virtual Visits (Telemedicine).  Patients are able to view lab/test results, encounter notes, upcoming appointments, etc.  Non-urgent messages can be sent to your provider as well.   To learn more about what you can do with MyChart, go to [email or web address].    Your next appointment:   2 week(s)  The format for your next appointment:   In Person  Provider:   You may see DR Harrell Gave END or one of the following Advanced Practice Providers on your designated Care Team:    Murray Hodgkins, NP  Christell Faith, PA-C  Marrianne Mood, PA-C  Cadence Bally, [place]  Laurann Montana, NP    Echocardiogram An echocardiogram is a test that uses sound waves (ultrasound) to produce images of the heart. Images from an echocardiogram can provide important information about:  Heart size and shape.  The size and thickness and movement of your heart's walls.  Heart muscle function and strength.  Heart valve function or if you have stenosis. Stenosis is when the heart valves are too narrow.  If blood is flowing backward through the heart valves (regurgitation).  A tumor or infectious growth around the heart valves.  Areas of heart muscle that are not working well because of poor blood flow or injury from a heart attack.  Aneurysm detection. An aneurysm is a weak or damaged part of an  artery wall. The wall bulges out from the normal force of blood pumping through the body. Tell a health care provider about:  Any allergies you have.  All medicines you are taking, including vitamins, herbs, eye drops, creams, and over-the-counter medicines.  Any blood disorders you have.  Any surgeries you have had.  Any medical conditions you have.  Whether you are pregnant or may be pregnant. What are the risks? Generally, this is a safe  test. However, problems may occur, including an allergic reaction to dye (contrast) that may be used during the test. What happens before the test? No specific preparation is needed. You may eat and drink normally. What happens during the test?  You will take off your clothes from the waist up and put on a hospital gown.  Electrodes or electrocardiogram (ECG)patches may be placed on your chest. The electrodes or patches are then connected to a device that monitors your heart rate and rhythm.  You will lie down on a table for an ultrasound exam. A gel will be applied to your chest to help sound waves pass through your skin.  A handheld device, called a transducer, will be pressed against your chest and moved over your heart. The transducer produces sound waves that travel to your heart and bounce back (or "echo" back) to the transducer. These sound waves will be captured in real-time and changed into images of your heart that can be viewed on a video monitor. The images will be recorded on a computer and reviewed by your health care provider.  You may be asked to change positions or hold your breath for a short time. This makes it easier to get different views or better views of your heart.  In some cases, you may receive contrast through an IV in one of your veins. This can improve the quality of the pictures from your heart. The procedure may vary among health care providers and hospitals.   What can I expect after the test? You may return to your normal, everyday life, including diet, activities, and medicines, unless your health care provider tells you not to do that. Follow these instructions at home:  It is up to you to get the results of your test. Ask your health care provider, or the department that is doing the test, when your results will be ready.  Keep all follow-up visits. This is important. Summary  An echocardiogram is a test that uses sound waves (ultrasound) to produce  images of the heart.  Images from an echocardiogram can provide important information about the size and shape of your heart, heart muscle function, heart valve function, and other possible heart problems.  You do not need to do anything to prepare before this test. You may eat and drink normally.  After the echocardiogram is completed, you may return to your normal, everyday life, unless your health care provider tells you not to do that. This information is not intended to replace advice given to you by your health care provider. Make sure you discuss any questions you have with your health care provider. Document Revised: 01/01/2020 Document Reviewed: 01/01/2020 Elsevier Patient Education  2021 [employer].

## 2020-07-24 ENCOUNTER — Ambulatory Visit
Admission: RE | Admit: 2020-07-24 | Discharge: 2020-07-24 | Disposition: A | Discharge status: Home / Self Care | Payer: Medicare HMO | Source: Ambulatory Visit | Attending: Physician Assistant | Admitting: Physician Assistant

## 2020-07-24 ENCOUNTER — Ambulatory Visit (HOSPITAL_BASED_OUTPATIENT_CLINIC_OR_DEPARTMENT_OTHER)
Admission: RE | Admit: 2020-07-24 | Discharge: 2020-07-24 | Disposition: A | Discharge status: Home / Self Care | Payer: Medicare HMO | Source: Ambulatory Visit | Attending: Internal Medicine | Admitting: Internal Medicine

## 2020-07-24 DIAGNOSIS — R928 Other abnormal and inconclusive findings on diagnostic imaging of breast: Secondary | ICD-10-CM | POA: Insufficient documentation

## 2020-07-24 DIAGNOSIS — R0602 Shortness of breath: Secondary | ICD-10-CM | POA: Insufficient documentation

## 2020-07-24 DIAGNOSIS — R000 Tachycardia, unspecified: Secondary | ICD-10-CM | POA: Insufficient documentation

## 2020-07-24 LAB — ECHOCARDIOGRAM COMPLETE
AR max vel: 3.34 cm2
AV Area VTI: 3.06 cm2
AV Area mean vel: 2.94 cm2
AV Mean grad: 4 mmHg
AV Peak grad: 5.4 mmHg
Ao pk vel: 1.16 m/s
Area-P 1/2: 5.62 cm2
S' Lateral: 2.77 cm

## 2020-07-24 NOTE — Progress Notes (Signed)
[facility]  [address] [place], [place] [address] Phone: [phone]  Fax: [phone]   Clinic Day:  07/28/2020  Referring physician: Naaman Plummer, MD  Chief Complaint: Natasha Farrell is a 75 y.o. female with lymphadenopathy who is referred in consultation by Dr Valora Piccolo for assessment and management.   HPI:  She was seen by Dr Paulita Cradle for follow-up on 03/20/2020.  She had received the COVID-19 Moderna vaccine in the left arm on 03/17/2020.  She was scheduled for a mammogram.  Screening mammogram on 03/27/2020 recommended further evaluation for possible enlarged bilateral axillary lymph nodes.  Bilateral breast ultrasound on 04/07/2020 revealed multiple cortically thickened lymph nodes within the right axilla (up to 5 mm) and multiple cortically thickened lymph nodes within the left axilla (up to 7 mm). Ultrasound-guided core needle biopsy of one of the thickened lymph nodes was recommended.  Right axilla lymph node ultrasound biopsy on 04/21/2020 revealed fragments of lymph node with reactive follicular hyperplasia. There was no definite evidence of malignancy. Flow cytometry noted a CD10+ B cell population. Clonality could not be evaluated due to non-specific light chain binding. There was no evidence of a T cell lymphoproliferative disorder.  She developed shortness of breath and had flu symptoms on 05/30/2020. She saw a NP and her heart rate was around 140 bpm. She had an EKG and CXR. COVID-19 was negative. She received fluids and was prescribed antibiotics x 7 days and prednisone. After she finished the medications, she described some days were good and some were bad. She describes sweats and episodes of presyncope.  She describes not feeling good on 07/21/2019. She got up to cook a meal and started to feel very hot and like she was going to pass out. She went to Urgent Care and was sent to the ER.  The patient was seen in the [facility]  ER on 07/21/2020. She reported generalized weakness, palpitations, and diaphoresis x 1 month. Chest CT angiogram revealed extensive lymphadenopathy throughout the neck, chest, and visualized portions of the abdomen, and massive splenomegaly.  Largest right axillary lymph node was 2.1 x 1.6 cm and left axillary lymph node 3.0 x 1.5 cm.  There were enlarged lymph nodes interposed between the brachiocephalic vein and left subclavian measuring 2.1 x 1.6 cm.  Overall, findings favored leukemia or lymphoma. There was no evidence of pulmonary embolus. There was a small left pleural effusion.   Bilateral breast ultrasound on 07/24/2020 revealed continued increase in bilateral axillary lymphadenopathy. Given systemic increase in adenopathy, this remained concerning for an underlying lymphoproliferative process.   Symptomatically, she notes drenching night sweats; she sometimes has to change her night gown and pillowcase. She has a cough and shortness of breath. Her stomach always feels full. She feels weak and fatigued. She has lost 6 lbs since these symptoms started in 05/2020. She denies after bath itching.  She is nauseous and her stools have been black this week. She is not taking iron or milk of magnesia. She reports urinary frequency. She has neck pain and pain under her sternum.  She denies fevers, headaches, changes in vision, runny nose, sore throat, chest pain, palpitations, vomiting, diarrhea, reflux, urinary symptoms, skin changes, numbness, weakness, balance or coordination problems, and bleeding of any kind.  Her father died from prostate cancer. Her sister had breast cancer. Another sister had colon cancer and kidney cancer.   Past Medical History:  Diagnosis Date  . Hx of hysterectomy   .  Hyperlipidemia   . Hypertension   . Thyroid disease    rt thryroidectomy  09/25/2010    Past Surgical History:  Procedure Laterality Date  . BREAST BIOPSY Right 04/21/2020   hydromark 3 [place] bx path  pending    Family History  Problem Relation Age of Onset  . Breast cancer Sister 23  . Colon cancer Sister   . Kidney cancer Sister     Social History:  reports that she has never smoked. She has never used smokeless tobacco. She reports that she does not drink alcohol and does not use drugs. She does not use alcohol or drugs. She denies exposure to radiation or toxins. She is a retired Librarian, academic. Her husband is Josph Macho. The patient is accompanied by Josph Macho today.  Allergies:  Allergies  Allergen Reactions  . Lisinopril Swelling    angioedema    Current Medications: Current Outpatient Medications  Medication Sig Dispense Refill  . amLODipine (NORVASC) 10 MG tablet Take 1 tablet by mouth daily.    . carvedilol (COREG) 12.5 MG tablet Take 1 tablet (12.5 mg total) by mouth 2 (two) times daily. 60 tablet 3  . cholecalciferol (VITAMIN D3) 25 MCG (1000 UNIT) tablet Take 2,000 Units by mouth daily.    . Multiple Vitamins-Minerals (MULTIVITAMIN WITH MINERALS) tablet Take 1 tablet by mouth daily.    . pravastatin (PRAVACHOL) 40 MG tablet Take 40 mg by mouth daily.    Marland Kitchen spironolactone (ALDACTONE) 25 MG tablet Take 25 mg by mouth daily.    Marland Kitchen SYNTHROID 50 MCG tablet Take 1 tablet (50 mcg total) by mouth daily. 30 tablet 3   No current facility-administered medications for this visit.    Review of Systems  Constitutional: Positive for diaphoresis, malaise/fatigue and weight loss (6 lbs since 05/2020). Negative for chills and fever.  HENT: Negative for congestion, ear discharge, ear pain, hearing loss, nosebleeds, sinus pain, sore throat and tinnitus.   Eyes: Negative for blurred vision.  Respiratory: Positive for cough and shortness of breath. Negative for hemoptysis and sputum production.   Cardiovascular: Negative for chest pain, palpitations and leg swelling.  Gastrointestinal: Positive for melena and nausea. Negative for abdominal pain, blood in stool, constipation, diarrhea, heartburn and  vomiting.       Early satiety.  Genitourinary: Positive for frequency. Negative for dysuria, hematuria and urgency.  Musculoskeletal: Positive for neck pain. Negative for back pain, joint pain and myalgias.       Pain under sternum  Skin: Negative for itching and rash.  Neurological: Positive for weakness (generalized). Negative for dizziness, tingling, sensory change and headaches.  Endo/Heme/Allergies: Does not bruise/bleed easily.  Psychiatric/Behavioral: Negative for depression and memory loss. The patient is not nervous/anxious and does not have insomnia.   All other systems reviewed and are negative.  Performance status (ECOG): 1  Vitals Blood pressure (!) 141/71, pulse (!) 103, temperature (!) 96.8 F (36 C), temperature source Tympanic, weight 175 lb (79.4 kg), SpO2 97 %.   Physical Exam Vitals and nursing note reviewed.  Constitutional:      General: She is not in acute distress.    Appearance: She is not diaphoretic.  HENT:     Head: Normocephalic and atraumatic.     Mouth/Throat:     Mouth: Mucous membranes are moist.     Pharynx: Oropharynx is clear.  Eyes:     General: No scleral icterus.    Extraocular Movements: Extraocular movements intact.     Conjunctiva/sclera: Conjunctivae normal.  Pupils: Pupils are equal, round, and reactive to light.  Cardiovascular:     Rate and Rhythm: Normal rate and regular rhythm.     Heart sounds: Normal heart sounds. No murmur heard.   Pulmonary:     Effort: Pulmonary effort is normal. No respiratory distress.     Breath sounds: No wheezing or rales.     Comments: Decreased breath sounds left lung base. Chest:     Chest wall: No tenderness.  Breasts:     Right: Axillary adenopathy (Multiple right axillary lymph nodes, largest is at least 2 cm) present. No supraclavicular adenopathy.     Left: Axillary adenopathy (Multiple left axillary lymph nodes, largest is at least 3-4 cm. Additional nodes 1-2 cm) present. No  supraclavicular adenopathy.    Abdominal:     General: Bowel sounds are normal. There is no distension.     Palpations: Abdomen is soft. There is splenomegaly. There is no hepatomegaly (liver edge palpable) or mass.     Tenderness: There is abdominal tenderness. There is no guarding or rebound.  Musculoskeletal:        General: No swelling or tenderness. Normal range of motion.     Cervical back: Normal range of motion and neck supple.  Lymphadenopathy:     Head:     Right side of head: No preauricular, posterior auricular or occipital adenopathy.     Left side of head: Preauricular (small) adenopathy present. No posterior auricular or occipital adenopathy.     Cervical: Cervical adenopathy (Right posterior low cervical lymph node. Cluster of low cervical lymph nodes on the left) present.     Upper Body:     Right upper body: Axillary adenopathy (Multiple right axillary lymph nodes, largest is at least 2 cm) present. No supraclavicular adenopathy.     Left upper body: Axillary adenopathy (Multiple left axillary lymph nodes, largest is at least 3-4 cm. Additional nodes 1-2 cm) present. No supraclavicular adenopathy.     Lower Body: No right inguinal adenopathy. Left inguinal adenopathy present.  Skin:    General: Skin is warm and dry.  Neurological:     Mental Status: She is alert and oriented to person, place, and time.  Psychiatric:        Behavior: Behavior normal.        Thought Content: Thought content normal.        Judgment: Judgment normal.    No visits with results within 3 Day(s) from this visit.  Latest known visit with results is:  Hospital Outpatient Visit on 07/24/2020  Component Date Value Ref Range Status  . Ao pk vel 07/24/2020 1.16  m/s Final  . AV Area VTI 07/24/2020 3.06  cm2 Final  . AR max vel 07/24/2020 3.34  cm2 Final  . AV Mean grad 07/24/2020 4.0  mmHg Final  . AV Peak grad 07/24/2020 5.4  mmHg Final  . S' Lateral 07/24/2020 2.77  cm Final  . AV Area  mean vel 07/24/2020 2.94  cm2 Final  . Area-P 1/2 07/24/2020 5.62  cm2 Final    Assessment:  LENER VENTRESCA is a 75 y.o. female with extensive adenopathy and massive splenomegaly c/w lymphoma.    Right axillary ultrasound guided biopsy on 04/21/2020 revealed fragments of lymph node with reactive follicular hyperplasia. There was no definite evidence of malignancy. Flow cytometry revealed a CD10+ B cell population. Clonality could not be evaluated due to non-specific light chain binding. There was no evidence of a T cell lymphoproliferative  disorder.  Chest CT angiogram on 07/21/2020 revealed extensive lymphadenopathy throughout the neck, chest, and visualized portions of the abdomen, along with marked splenomegaly.  Largest right axillary lymph node was 2.1 x 1.6 cm and left axillary lymph node 3.0 x 1.5 cm.  There were enlarged lymph nodes interposed between the brachiocephalic vein and left subclavian measuring 2.1 x 1.6 cm.  Overall, findings favored leukemia or lymphoma. There was no evidence of pulmonary embolus. There was a small left pleural effusion.   She has a family history of malignancy.  Her father died from prostate cancer. Her sister had breast cancer. Another sister had colon cancer and kidney cancer.  The patient received the COVID-19 vaccine in 06/2019 and 07/2019. The Moderna Booster was on 03/17/2020.   Symptomatically, she notes drenching night sweats, early satiety, and a 6 pound weight loss.  Exam reveals extensive adenopathy and splenomegaly.  Plan: 1.   Labs today:  CBC with diff, CMP, LDH, uric acid, PT, hepatitis B core antibody, hepatitis B surface antigen, hepatitis C antibody, HIV testing, G6PD assay.  2.   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  Initial biopsy on 04/21/2020 performed when lymph nodes small suggested a B cell population.  Clinically, she has significant adenopathy and splenomegaly associated  with B symptoms.  Discuss lymph node excisional biopsy for confirmation and classification.    Surgery contacted.  Discuss tumor lysis.  Check uric acid today.  Discuss PET scan to assess extent of disease.  Anticipate bone marrow aspirate and biopsy for staging.   Procedure discussed in detail.  Multiple questions asked and answered. 3.   Thrombocytopenia  Platelet count is 68,000.  Etiology likely secondary to splenomegaly or marrow involvement.  Anticipate bone marrow aspirate and biopsy. 4.   Family history of malignancy  Consider [facility] genetic testing at next visit. 5.   PET scan on 08/01/2020. 6.   Surgery referral (Dr Bary Castilla) re: lymph node biopsy. 7.   RTC after PET scan and lymph node biopsy for MD assessment and discussion regarding direction of therapy.  Addendum:  Uric acid was 9.3.  The patient was contacted.  Allopurinol was initiated.  A bone marrow aspirate and biopsy will be scheduled for 08/04/2020.  I discussed the assessment and treatment plan with the patient.  The patient was provided an opportunity to ask questions and all were answered.  The patient agreed with the plan and demonstrated an understanding of the instructions.  The patient was advised to call back if the symptoms worsen or if the condition fails to improve as anticipated.  I provided 47 minutes of face-to-face time during this this encounter and > 50% was spent counseling as documented under my assessment and plan. An additional 15 minutes were spent reviewing her chart ([facility] [place]) including notes, labs, and imaging studies and contacting surgery.    Britania Shreeve C. Mike Gip, MD, PhD    07/28/2020, 3:12 PM  I, Mirian Mo Tufford, am acting as Education administrator for [employer]. Mike Gip, MD, PhD.  I, Blanca [place] C. Mike Gip, MD, have reviewed the above documentation for accuracy and completeness, and I agree with the above.

## 2020-07-24 NOTE — Progress Notes (Signed)
*  PRELIMINARY RESULTS* Echocardiogram 2D Echocardiogram has been performed.  Sherrie Sport 07/24/2020, 1:33 PM

## 2020-07-26 DIAGNOSIS — R161 Splenomegaly, not elsewhere classified: Secondary | ICD-10-CM | POA: Insufficient documentation

## 2020-07-26 DIAGNOSIS — R599 Enlarged lymph nodes, unspecified: Secondary | ICD-10-CM | POA: Insufficient documentation

## 2020-07-28 ENCOUNTER — Other Ambulatory Visit: Payer: Self-pay

## 2020-07-28 ENCOUNTER — Encounter: Payer: Self-pay | Admitting: Hematology and Oncology

## 2020-07-28 ENCOUNTER — Inpatient Hospital Stay: Payer: Medicare HMO

## 2020-07-28 ENCOUNTER — Inpatient Hospital Stay: Payer: Medicare HMO | Attending: Hematology and Oncology | Admitting: Hematology and Oncology

## 2020-07-28 VITALS — BP 141/71 | HR 103 | Temp 96.8°F | Wt 175.0 lb

## 2020-07-28 DIAGNOSIS — E790 Hyperuricemia without signs of inflammatory arthritis and tophaceous disease: Secondary | ICD-10-CM

## 2020-07-28 DIAGNOSIS — R61 Generalized hyperhidrosis: Secondary | ICD-10-CM | POA: Diagnosis not present

## 2020-07-28 DIAGNOSIS — R350 Frequency of micturition: Secondary | ICD-10-CM | POA: Diagnosis not present

## 2020-07-28 DIAGNOSIS — E079 Disorder of thyroid, unspecified: Secondary | ICD-10-CM | POA: Diagnosis not present

## 2020-07-28 DIAGNOSIS — R161 Splenomegaly, not elsewhere classified: Secondary | ICD-10-CM | POA: Diagnosis not present

## 2020-07-28 DIAGNOSIS — J90 Pleural effusion, not elsewhere classified: Secondary | ICD-10-CM | POA: Diagnosis not present

## 2020-07-28 DIAGNOSIS — R5383 Other fatigue: Secondary | ICD-10-CM | POA: Insufficient documentation

## 2020-07-28 DIAGNOSIS — D696 Thrombocytopenia, unspecified: Secondary | ICD-10-CM | POA: Insufficient documentation

## 2020-07-28 DIAGNOSIS — I10 Essential (primary) hypertension: Secondary | ICD-10-CM

## 2020-07-28 DIAGNOSIS — R599 Enlarged lymph nodes, unspecified: Secondary | ICD-10-CM | POA: Diagnosis present

## 2020-07-28 DIAGNOSIS — R590 Localized enlarged lymph nodes: Secondary | ICD-10-CM

## 2020-07-28 LAB — COMPREHENSIVE METABOLIC PANEL
ALT: 15 U/L (ref 0–44)
AST: 34 U/L (ref 15–41)
Albumin: 4.5 g/dL (ref 3.5–5.0)
Alkaline Phosphatase: 96 U/L (ref 38–126)
Anion gap: 15 (ref 5–15)
BUN: 7 mg/dL — ABNORMAL LOW (ref 8–23)
CO2: 24 mmol/L (ref 22–32)
Calcium: 10.1 mg/dL (ref 8.9–10.3)
Chloride: 93 mmol/L — ABNORMAL LOW (ref 98–111)
Creatinine, Ser: 0.81 mg/dL (ref 0.44–1.00)
GFR, Estimated: >60 mL/min (ref >60)
Glucose, Bld: 100 mg/dL — ABNORMAL HIGH (ref 70–99)
Potassium: 3.5 mmol/L (ref 3.5–5.1)
Sodium: 132 mmol/L — ABNORMAL LOW (ref 135–145)
Total Bilirubin: 1.2 mg/dL (ref 0.3–1.2)
Total Protein: 9 g/dL — ABNORMAL HIGH (ref 6.5–8.1)

## 2020-07-28 LAB — CBC WITH DIFFERENTIAL/PLATELET
Abs Immature Granulocytes: 0.26 10*3/uL — ABNORMAL HIGH (ref 0.00–0.07)
Basophils Absolute: 0 10*3/uL (ref 0.0–0.1)
Basophils Relative: 1 %
Eosinophils Absolute: 0.2 10*3/uL (ref 0.0–0.5)
Eosinophils Relative: 2 %
HCT: 36.4 % (ref 36.0–46.0)
Hemoglobin: 12.3 g/dL (ref 12.0–15.0)
Immature Granulocytes: 3 %
Lymphocytes Relative: 19 %
Lymphs Abs: 1.6 10*3/uL (ref 0.7–4.0)
MCH: 29.1 pg (ref 26.0–34.0)
MCHC: 33.8 g/dL (ref 30.0–36.0)
MCV: 86.1 fL (ref 80.0–100.0)
Monocytes Absolute: 1.2 10*3/uL — ABNORMAL HIGH (ref 0.1–1.0)
Monocytes Relative: 14 %
Neutro Abs: 5.2 10*3/uL (ref 1.7–7.7)
Neutrophils Relative %: 61 %
Platelets: 63 10*3/uL — ABNORMAL LOW (ref 150–400)
RBC: 4.23 MIL/uL (ref 3.87–5.11)
RDW: 15.5 % (ref 11.5–15.5)
WBC: 8.5 10*3/uL (ref 4.0–10.5)
nRBC: 0 % (ref 0.0–0.2)

## 2020-07-28 LAB — HEPATITIS B SURFACE ANTIGEN: Hepatitis B Surface Ag: NONREACTIVE

## 2020-07-28 LAB — URIC ACID: Uric Acid, Serum: 9.3 mg/dL — ABNORMAL HIGH (ref 2.5–7.1)

## 2020-07-28 LAB — HEPATITIS B CORE ANTIBODY, TOTAL: Hep B Core Total Ab: NONREACTIVE

## 2020-07-28 LAB — HIV ANTIBODY (ROUTINE TESTING W REFLEX): HIV Screen 4th Generation wRfx: NONREACTIVE

## 2020-07-28 LAB — PROTIME-INR
INR: 1.2 (ref 0.8–1.2)
Prothrombin Time: 14.3 seconds (ref 11.4–15.2)

## 2020-07-28 LAB — LACTATE DEHYDROGENASE: LDH: 182 U/L (ref 98–192)

## 2020-07-28 LAB — HEPATITIS C ANTIBODY: HCV Ab: NONREACTIVE

## 2020-07-28 MED ORDER — ALLOPURINOL 300 MG PO TABS: 300.0000 mg | ORAL_TABLET | Freq: Every day | ORAL | 2 refills | Status: DC

## 2020-07-29 ENCOUNTER — Telehealth: Payer: Self-pay | Admitting: Hematology and Oncology

## 2020-07-29 ENCOUNTER — Other Ambulatory Visit: Payer: Self-pay | Admitting: General Surgery

## 2020-07-29 LAB — GLUCOSE 6 PHOSPHATE DEHYDROGENASE
G6PDH: 7.7 U/g{Hb} (ref 4.8–15.7)
Hemoglobin: 12.6 g/dL (ref 11.1–15.9)

## 2020-07-29 NOTE — Progress Notes (Signed)
Subjective:     Patient ID: Natasha Farrell is a 75 y.o. female.  HPI  The following portions of the patient's history were reviewed and updated as appropriate.  This a new patient is here today for: office visit. She is here to discuss having a left axillary node excision for lymphoma referred by Dr Mike Gip. She states she has had swollen nodes since last year and that they did a right axillary biopsy in November.  The patient reports about 6-7 pound weight loss over the last few months.  She notices early satiety as well as a decreased appetite.  She has had drenching night sweats for the last several months.   She is here with her husband of 58 years, Josph Macho.  (His sister is one of my old patients, Eliseo Squires).   Review of Systems  Constitutional: Negative for chills and fever.  Respiratory: Positive for shortness of breath. Negative for cough.        Chief Complaint  Patient presents with  . New Patient     BP 122/60   Pulse 95   Temp 36.4 C (97.6 F)   Ht 162.6 cm (5\' 4" )   Wt 78.9 kg (174 lb)   SpO2 96%   BMI 29.87 kg/m       Past Medical History:  Diagnosis Date  . DDD (degenerative disc disease), lumbar   . FH: colon cancer 12/13/2018  . History of hypercalcemia   . Hypercholesterolemia   . Hypertension   . Hypothyroid, unspecified   . Osteopenia           Past Surgical History:  Procedure Laterality Date  . APPENDECTOMY    . BREAST EXCISIONAL BIOPSY Right 04/21/2020  . COLONOSCOPY  07/04/2003   Dr. Ivor Messier @ [facility] [place]  . COLONOSCOPY  10/21/2008   Dr. Kennis Carina @ [facility] - Int. Hemorrhoids, [facility], rpt 5 yrs per RTE  . COLONOSCOPY  01/05/2019   Negative colon biopsy/FHx CC-Father/Repeat 82yrs/TKT  . FLEXIBLE SIGMOIDOSCOPY  06/10/1997   Dr. Ivor Messier @ [facility] - Hyperplatic Polyp  . HYSTERECTOMY    . LOBECTOMY PARTIAL THYROID Right 2012   Benign adenoma - Dr. Harlow Asa  . TUBAL LIGATION                 OB History    Gravida  2   Para  2   Term      Preterm      AB      Living        SAB      IAB      Ectopic      Molar      Multiple      Live Births          Obstetric Comments  Age at first period  53 Age of first pregnancy 54         Social History          Socioeconomic History  . Marital status: Married    Spouse name: Not on file  . Number of children: Not on file  . Years of education: Not on file  . Highest education level: Not on file  Occupational History  . Occupation: Retired    Comment: Arts development officer  . Smoking status: Never Smoker  . Smokeless tobacco: Never Used  Vaping Use  . Vaping Use: Never used  Substance and Sexual Activity  . Alcohol use: No  .  Drug use: No  . Sexual activity: Defer  Other Topics Concern  . Not on file  Social History Narrative  . Not on file   Social Determinants of Health   Financial Resource Strain: Not on file  Food Insecurity: Not on file  Transportation Needs: Not on file           Allergies  Allergen Reactions  . Lisinopril Swelling    Current Medications        Current Outpatient Medications  Medication Sig Dispense Refill  . amLODIPine (NORVASC) 10 MG tablet Take 1 tablet (10 mg total) by mouth once daily 90 tablet 1  . levothyroxine (SYNTHROID) 50 MCG tablet TAKE 1 TABLET (50 MCG TOTAL) BY MOUTH ONCE DAILY TAKE ON AN EMPTY STOMACH WITH A GLASS OF WATER AT LEAST 30-60 MINUTES BEFORE BREAKFAST. 90 tablet 1  . metoprolol succinate (TOPROL-XL) 100 MG XL tablet Take 1.5 tablets (150 mg total) by mouth once daily 135 tablet 1  . multivitamin with minerals tablet Take 1 tablet by mouth once daily      . pravastatin (PRAVACHOL) 40 MG tablet TAKE 1 TABLET BY MOUTH EVERY DAY 90 tablet 1  . predniSONE (DELTASONE) 20 MG tablet Take 2 tablets x4 days, then decrease to 1 tablet x4 days. (Patient not taking: Reported on 07/21/2020  ) 12 tablet 0  . spironolactone  (ALDACTONE) 50 MG tablet TAKE 1 TABLET BY MOUTH EVERY DAY 90 tablet 1   No current facility-administered medications for this visit.           Family History  Problem Relation Age of Onset  . Dementia Mother   . Myocardial Infarction (Heart attack) Mother   . High blood pressure (Hypertension) Father   . Diabetes type II Father   . Prostate cancer Father   . Breast cancer Sister   . Colon cancer Sister   . Kidney cancer Sister   . No Known Problems Son          Objective:   Physical Exam Exam conducted with a chaperone present.  Constitutional:      Appearance: Normal appearance.  Cardiovascular:     Rate and Rhythm: Normal rate and regular rhythm.     Pulses: Normal pulses.     Heart sounds: Normal heart sounds.  Pulmonary:     Effort: Pulmonary effort is normal.     Breath sounds: Normal breath sounds.  Chest:  Breasts:     Right: Axillary adenopathy present. No supraclavicular adenopathy.     Left: Axillary adenopathy present. No supraclavicular adenopathy.    Abdominal:     General: Bowel sounds are normal.     Palpations: There is hepatomegaly and splenomegaly.     Tenderness: There is abdominal tenderness in the left upper quadrant.       Comments: Tenderness with palpation over the spleen.   Musculoskeletal:     Cervical back: Neck supple.  Lymphadenopathy:     Cervical:     Right cervical: No superficial cervical adenopathy.    Left cervical: No superficial cervical adenopathy.     Upper Body:     Right upper body: Axillary adenopathy present. No supraclavicular adenopathy.     Left upper body: Axillary adenopathy present. No supraclavicular adenopathy.     Lower Body: Right inguinal adenopathy present. Left inguinal adenopathy present.  Skin:    General: Skin is warm and dry.  Neurological:     Mental Status: She is alert and oriented to person,  place, and time.  Psychiatric:        Mood and Affect: Mood normal.         Behavior: Behavior normal.     Labs and Radiology:   July 28, 2020 laboratory: WBC 4.0 - 10.5 K/uL 8.5   RBC 3.87 - 5.11 MIL/uL 4.23   Hemoglobin 12.0 - 15.0 g/dL 12.3   HCT 36.0 - 46.0 % 36.4   MCV 80.0 - 100.0 fL 86.1   MCH 26.0 - 34.0 pg 29.1   MCHC 30.0 - 36.0 g/dL 33.8   RDW 11.5 - 15.5 % 15.5   Platelets 150 - 400 K/uL 63Low   Comment: Immature Platelet Fraction may be  clinically indicated, consider  ordering this additional test  [id number]  CONSISTENT WITH PREVIOUS RESULT   nRBC 0.0 - 0.2 % 0.0   Neutrophils Relative % % 61   Neutro Abs 1.7 - 7.7 K/uL 5.2   Lymphocytes Relative % 19   Lymphs Abs 0.7 - 4.0 K/uL 1.6   Monocytes Relative % 14   Monocytes Absolute 0.1 - 1.0 K/uL 1.2High   Eosinophils Relative % 2   Eosinophils Absolute 0.0 - 0.5 K/uL 0.2   Basophils Relative % 1   Basophils Absolute 0.0 - 0.1 K/uL 0.0   Immature Granulocytes % 3   Abs Immature Granulocytes 0.00 - 0.07 K/uL 0.85UDJS   Sodium 135 - 145 mmol/L 132Low   Potassium 3.5 - 5.1 mmol/L 3.5   Chloride 98 - 111 mmol/L 93Low   CO2 22 - 32 mmol/L 24   Glucose, Bld 70 - 99 mg/dL 100High   Comment: Glucose reference range applies only to samples taken after fasting for at least 8 hours.  BUN 8 - 23 mg/dL 7Low   Creatinine, Ser 0.44 - 1.00 mg/dL 0.81   Calcium 8.9 - 10.3 mg/dL 10.1   Total Protein 6.5 - 8.1 g/dL 9.0High   Albumin 3.5 - 5.0 g/dL 4.5   AST 15 - 41 U/L 34   ALT 0 - 44 U/L 15   Alkaline Phosphatase 38 - 126 U/L 96   Total Bilirubin 0.3 - 1.2 mg/dL 1.2   GFR, Estimated >60 mL/min >60   Comment: (NOTE)     Ref Range & Units 1 d ago  LDH 98 - 192 U/L 182     Prothrombin Time 11.4 - 15.2 seconds 14.3   INR 0.8 - 1.2 1.2        Uric Acid, Serum 2.5 - 7.1 mg/dL 9.3High     CT angio July 21, 2020:   The study was reviewed and showed bilateral axillary adenopathy. Left pleural effusion.  Massive splenomegaly.   Bilateral axillary  ultrasound of July 24, 2020:  Bilateral adenopathy.     Assessment:     Suspected lymphoma with involvement both above and below the diaphragm.  Splenomegaly with resulting thrombocytopenia.  Elevated serum uric acid.  Mild hyponatremia.    Plan:     Indication for excisional biopsy of the lymph node was reviewed in detail.  While she shows both axillary and inguinal adenopathy, will biopsy the left axillary nodes to minimize inflammatory changes that at times can confuse analysis of the inguinal area.  The importance of adequate nutritional support during this time of decreased appetite and early satiety with nutritional supplements was reviewed.  After surgery, minimizing repetitive motion involving the left shoulder was encouraged.  Approximately 50 minutes was spent on prereview of the case yesterday with medical oncology and  patient assessment today.    Entered by Karie Fetch, RN, acting as a scribe for Dr. Hervey Ard, MD.    The documentation recorded by the scribe accurately reflects the service I personally performed and the decisions made by me.   Robert Bellow, MD FACS

## 2020-07-30 ENCOUNTER — Telehealth: Payer: Self-pay | Admitting: Hematology and Oncology

## 2020-07-30 ENCOUNTER — Other Ambulatory Visit: Payer: Self-pay

## 2020-07-30 ENCOUNTER — Other Ambulatory Visit
Admission: RE | Admit: 2020-07-30 | Discharge: 2020-07-30 | Disposition: A | Discharge status: Home / Self Care | Payer: Medicare HMO | Source: Ambulatory Visit | Attending: General Surgery | Admitting: General Surgery

## 2020-07-30 DIAGNOSIS — Z01818 Encounter for other preprocedural examination: Secondary | ICD-10-CM | POA: Insufficient documentation

## 2020-07-30 NOTE — Telephone Encounter (Signed)
07/30/2020 Spoke w/ pt and informed her that PET scan has been authorized and scheduled for 08/12/20. Reminded pt of address, arrival time, and NPO instructions. Also printed an updated AVS and mailed it to her. Pt confirmed appt.  SRW

## 2020-07-30 NOTE — Patient Instructions (Signed)
Your procedure is scheduled on: 08/01/2020 Report to the Registration Desk on the 1st floor of the [facility]. To find out your arrival time, please call [phone] between 1PM - 3PM on: 07/31/2020  REMEMBER: Instructions that are not followed completely may result in serious medical risk, up to and including death; or upon the discretion of your surgeon and anesthesiologist your surgery may need to be rescheduled.  Do not eat food after midnight the night before surgery.  No gum chewing, lozengers or hard candies.  You may however, drink CLEAR liquids up to 2 hours before you are scheduled to arrive for your surgery. Do not drink anything within 2 hours of your scheduled arrival time.  Clear liquids include: - water  - apple juice without pulp - gatorade (not RED, PURPLE, OR BLUE) - black coffee or tea (Do NOT add milk or creamers to the coffee or tea) Do NOT drink anything that is not on this list.  TAKE THESE MEDICATIONS THE MORNING OF SURGERY WITH A SIP OF WATER: - amLODipine (NORVASC) 10 MG tablet - carvedilol (COREG) 12.5 MG tablet - SYNTHROID 50 MCG tablet - allopurinol (ZYLOPRIM) 300 MG tablet  Follow recommendations from Cardiologist, Pulmonologist or PCP regarding stopping Aspirin, Coumadin, Plavix, Eliquis, Pradaxa, or Pletal.  One week prior to surgery: Stop Anti-inflammatories (NSAIDS) such as Advil, Aleve, Ibuprofen, Motrin, Naproxen, Naprosyn and Aspirin based products such as Excedrin, Goodys Powder, BC Powder.  Stop ANY OVER THE COUNTER supplements until after surgery.  No Alcohol for 24 hours before or after surgery.  No Smoking including e-cigarettes for 24 hours prior to surgery.  No chewable tobacco products for at least 6 hours prior to surgery.  No nicotine patches on the day of surgery.  Do not use any "recreational" drugs for at least a week prior to your surgery.  Please be advised that the combination of cocaine and anesthesia may have negative  outcomes, up to and including death. If you test positive for cocaine, your surgery will be cancelled.  On the morning of surgery brush your teeth with toothpaste and water, you may rinse your mouth with mouthwash if you wish. Do not swallow any toothpaste or mouthwash.  Do not wear jewelry, make-up, hairpins, clips or nail polish.  Do not wear lotions, powders, or perfumes.   Do not shave body from the neck down 48 hours prior to surgery just in case you cut yourself which could leave a site for infection.  Also, freshly shaved skin may become irritated if using the CHG soap.  Contact lenses, hearing aids and dentures may not be worn into surgery.  Do not bring valuables to the hospital. [facility] is not responsible for any missing/lost belongings or valuables.   Use CHG Soap or wipes as directed on instruction sheet.  Notify your doctor if there is any change in your medical condition (cold, fever, infection).  Wear comfortable clothing (specific to your surgery type) to the hospital.  Plan for stool softeners for home use; pain medications have a tendency to cause constipation. You can also help prevent constipation by eating foods high in fiber such as fruits and vegetables and drinking plenty of fluids as your diet allows.  After surgery, you can help prevent lung complications by doing breathing exercises.  Take deep breaths and cough every 1-2 hours. Your doctor may order a device called an Incentive Spirometer to help you take deep breaths. When coughing or sneezing, hold a pillow firmly against your  incision with both hands. This is called "splinting." Doing this helps protect your incision. It also decreases belly discomfort.  If you are being admitted to the hospital overnight, leave your suitcase in the car. After surgery it may be brought to your room.  If you are being discharged the day of surgery, you will not be allowed to drive home. You will need a responsible  adult (18 years or older) to drive you home and stay with you that night.   If you are taking public transportation, you will need to have a responsible adult (18 years or older) with you. Please confirm with your physician that it is acceptable to use public transportation.   Please call the [facility] at [phone] if you have any questions about these instructions.  Surgery Visitation Policy:  Patients undergoing a surgery or procedure may have one family member or support person with them as long as that person is not COVID-19 positive or experiencing its symptoms.  That person may remain in the waiting area during the procedure.  Inpatient Visitation:    Visiting hours are 7 a.m. to 8 p.m. Inpatients will be allowed two visitors daily. The visitors may change each day during the patient's stay. No visitors under the age of 1. Any visitor under the age of 72 must be accompanied by an adult. The visitor must pass COVID-19 screenings, use hand sanitizer when entering and exiting the patient's room and wear a mask at all times, including in the patient's room. Patients must also wear a mask when staff or their visitor are in the room. Masking is required regardless of vaccination status.

## 2020-07-31 ENCOUNTER — Other Ambulatory Visit
Admission: RE | Admit: 2020-07-31 | Discharge: 2020-07-31 | Disposition: A | Discharge status: Home / Self Care | Payer: Medicare HMO | Source: Ambulatory Visit | Attending: General Surgery | Admitting: General Surgery

## 2020-07-31 DIAGNOSIS — Z20822 Contact with and (suspected) exposure to covid-19: Secondary | ICD-10-CM | POA: Insufficient documentation

## 2020-07-31 DIAGNOSIS — Z01812 Encounter for preprocedural laboratory examination: Secondary | ICD-10-CM | POA: Diagnosis present

## 2020-07-31 LAB — SARS CORONAVIRUS 2 (TAT 6-24 HRS): SARS Coronavirus 2: NEGATIVE

## 2020-08-01 ENCOUNTER — Encounter: Payer: Self-pay | Admitting: General Surgery

## 2020-08-01 ENCOUNTER — Ambulatory Visit: Payer: Medicare HMO | Admitting: Anesthesiology

## 2020-08-01 ENCOUNTER — Ambulatory Visit
Admission: RE | Admit: 2020-08-01 | Discharge: 2020-08-01 | Disposition: A | Discharge status: Home / Self Care | Payer: Medicare HMO | Source: Ambulatory Visit | Attending: General Surgery | Admitting: General Surgery

## 2020-08-01 ENCOUNTER — Encounter
Admission: RE | Disposition: A | Discharge status: Home / Self Care | Payer: Self-pay | Source: Ambulatory Visit | Attending: General Surgery

## 2020-08-01 ENCOUNTER — Other Ambulatory Visit: Payer: Self-pay

## 2020-08-01 ENCOUNTER — Ambulatory Visit: Payer: Medicare HMO

## 2020-08-01 ENCOUNTER — Other Ambulatory Visit: Payer: Self-pay | Admitting: Student

## 2020-08-01 DIAGNOSIS — Z79890 Hormone replacement therapy (postmenopausal): Secondary | ICD-10-CM | POA: Insufficient documentation

## 2020-08-01 DIAGNOSIS — Z888 Allergy status to other drugs, medicaments and biological substances status: Secondary | ICD-10-CM | POA: Diagnosis not present

## 2020-08-01 DIAGNOSIS — Z79899 Other long term (current) drug therapy: Secondary | ICD-10-CM | POA: Insufficient documentation

## 2020-08-01 DIAGNOSIS — R0902 Hypoxemia: Secondary | ICD-10-CM | POA: Insufficient documentation

## 2020-08-01 DIAGNOSIS — I899 Noninfective disorder of lymphatic vessels and lymph nodes, unspecified: Secondary | ICD-10-CM | POA: Diagnosis present

## 2020-08-01 DIAGNOSIS — C8514 Unspecified B-cell lymphoma, lymph nodes of axilla and upper limb: Secondary | ICD-10-CM | POA: Insufficient documentation

## 2020-08-01 DIAGNOSIS — R7981 Abnormal blood-gas level: Secondary | ICD-10-CM

## 2020-08-01 HISTORY — PX: LYMPH NODE BIOPSY: SHX201

## 2020-08-01 SURGERY — LYMPH NODE BIOPSY
Anesthesia: General | Laterality: Left

## 2020-08-01 MED ORDER — EPINEPHRINE PF 1 MG/ML IJ SOLN
INTRAMUSCULAR | Status: AC
  Filled 2020-08-01: qty 1

## 2020-08-01 MED ORDER — FAMOTIDINE 20 MG PO TABS: 20.0000 mg | ORAL_TABLET | Freq: Once | ORAL | Status: AC

## 2020-08-01 MED ORDER — HYDROCODONE-ACETAMINOPHEN 5-325 MG PO TABS: 1.0000 | ORAL_TABLET | ORAL | 0 refills | Status: DC | PRN

## 2020-08-01 MED ORDER — METHYLENE BLUE 0.5 % INJ SOLN
INTRAVENOUS | Status: AC
  Filled 2020-08-01: qty 10

## 2020-08-01 MED ORDER — ORAL CARE MOUTH RINSE: 15.0000 mL | Freq: Once | OROMUCOSAL | Status: AC

## 2020-08-01 MED ORDER — CHLORHEXIDINE GLUCONATE CLOTH 2 % EX PADS
6.0000 | MEDICATED_PAD | Freq: Once | CUTANEOUS | Status: AC
  Administered 2020-08-01: 6 via TOPICAL

## 2020-08-01 MED ORDER — PROPOFOL 10 MG/ML IV BOLUS
INTRAVENOUS | Status: DC | PRN
  Administered 2020-08-01: 50 mg via INTRAVENOUS
  Administered 2020-08-01: 150 mg via INTRAVENOUS

## 2020-08-01 MED ORDER — FENTANYL CITRATE (PF) 100 MCG/2ML IJ SOLN
INTRAMUSCULAR | Status: AC
  Filled 2020-08-01: qty 2

## 2020-08-01 MED ORDER — CHLORHEXIDINE GLUCONATE 0.12 % MT SOLN: 15.0000 mL | Freq: Once | OROMUCOSAL | Status: AC

## 2020-08-01 MED ORDER — ONDANSETRON HCL 4 MG/2ML IJ SOLN
INTRAMUSCULAR | Status: DC | PRN
  Administered 2020-08-01: 4 mg via INTRAVENOUS

## 2020-08-01 MED ORDER — MIDAZOLAM HCL 2 MG/2ML IJ SOLN
INTRAMUSCULAR | Status: AC
  Filled 2020-08-01: qty 2

## 2020-08-01 MED ORDER — PROPOFOL 10 MG/ML IV BOLUS
INTRAVENOUS | Status: AC
  Filled 2020-08-01: qty 20

## 2020-08-01 MED ORDER — ONDANSETRON HCL 4 MG/2ML IJ SOLN: 4.0000 mg | Freq: Once | INTRAMUSCULAR | Status: DC | PRN

## 2020-08-01 MED ORDER — DEXAMETHASONE SODIUM PHOSPHATE 10 MG/ML IJ SOLN
INTRAMUSCULAR | Status: DC | PRN
  Administered 2020-08-01: 8 mg via INTRAVENOUS

## 2020-08-01 MED ORDER — BUPIVACAINE-EPINEPHRINE (PF) 0.5% -1:200000 IJ SOLN
INTRAMUSCULAR | Status: DC | PRN
  Administered 2020-08-01: 30 mL

## 2020-08-01 MED ORDER — MIDAZOLAM HCL 2 MG/2ML IJ SOLN
INTRAMUSCULAR | Status: DC | PRN
  Administered 2020-08-01: 2 mg via INTRAVENOUS

## 2020-08-01 MED ORDER — BUPIVACAINE HCL (PF) 0.5 % IJ SOLN
INTRAMUSCULAR | Status: AC
  Filled 2020-08-01: qty 30

## 2020-08-01 MED ORDER — LACTATED RINGERS IV SOLN: INTRAVENOUS | Status: DC

## 2020-08-01 MED ORDER — CEFAZOLIN SODIUM-DEXTROSE 2-4 GM/100ML-% IV SOLN
2.0000 g | INTRAVENOUS | Status: AC
  Administered 2020-08-01: 2 g via INTRAVENOUS

## 2020-08-01 MED ORDER — FENTANYL CITRATE (PF) 100 MCG/2ML IJ SOLN: 25.0000 ug | INTRAMUSCULAR | Status: DC | PRN

## 2020-08-01 MED ORDER — FENTANYL CITRATE (PF) 100 MCG/2ML IJ SOLN
INTRAMUSCULAR | Status: DC | PRN
  Administered 2020-08-01: 50 ug via INTRAVENOUS
  Administered 2020-08-01 (×2): 25 ug via INTRAVENOUS

## 2020-08-01 MED ORDER — ACETAMINOPHEN 10 MG/ML IV SOLN
INTRAVENOUS | Status: DC | PRN
  Administered 2020-08-01: 1000 mg via INTRAVENOUS

## 2020-08-01 MED ORDER — LIDOCAINE HCL (PF) 1 % IJ SOLN
INTRAMUSCULAR | Status: AC
  Filled 2020-08-01: qty 30

## 2020-08-01 MED ORDER — CHLORHEXIDINE GLUCONATE 0.12 % MT SOLN
OROMUCOSAL | Status: AC
  Administered 2020-08-01: 15 mL via OROMUCOSAL
  Filled 2020-08-01: qty 15

## 2020-08-01 MED ORDER — ACETAMINOPHEN 10 MG/ML IV SOLN
INTRAVENOUS | Status: AC
  Filled 2020-08-01: qty 100

## 2020-08-01 MED ORDER — CEFAZOLIN SODIUM-DEXTROSE 2-4 GM/100ML-% IV SOLN
INTRAVENOUS | Status: AC
  Filled 2020-08-01: qty 100

## 2020-08-01 MED ORDER — LIDOCAINE HCL (CARDIAC) PF 100 MG/5ML IV SOSY
PREFILLED_SYRINGE | INTRAVENOUS | Status: DC | PRN
  Administered 2020-08-01: 100 mg via INTRAVENOUS

## 2020-08-01 MED ORDER — FAMOTIDINE 20 MG PO TABS
ORAL_TABLET | ORAL | Status: AC
  Administered 2020-08-01: 20 mg via ORAL
  Filled 2020-08-01: qty 1

## 2020-08-01 SURGICAL SUPPLY — 43 items
APL PRP STRL LF DISP 70% ISPRP (MISCELLANEOUS) ×1
APPLIER CLIP 11 MED OPEN (CLIP)
APR CLP MED 11 20 MLT OPN (CLIP)
BAG COUNTER SPONGE EZ (MISCELLANEOUS) IMPLANT
BAG SPNG 4X4 CLR HAZ (MISCELLANEOUS)
BINDER BREAST LRG (GAUZE/BANDAGES/DRESSINGS) ×2 IMPLANT
CHLORAPREP W/TINT 26 (MISCELLANEOUS) ×2 IMPLANT
CLIP APPLIE 11 MED OPEN (CLIP) IMPLANT
CNTNR SPEC 2.5X3XGRAD LEK (MISCELLANEOUS)
CONT SPEC 4OZ STER OR WHT (MISCELLANEOUS)
CONT SPEC 4OZ STRL OR WHT (MISCELLANEOUS)
CONTAINER SPEC 2.5X3XGRAD LEK (MISCELLANEOUS) IMPLANT
COVER PROBE FLX POLY STRL (MISCELLANEOUS) IMPLANT
COVER WAND RF STERILE (DRAPES) ×2 IMPLANT
DRAPE LAPAROTOMY 77X122 PED (DRAPES) ×2 IMPLANT
DRSG GAUZE FLUFF 36X18 (GAUZE/BANDAGES/DRESSINGS) ×4 IMPLANT
DRSG TEGADERM 2-3/8X2-3/4 SM (GAUZE/BANDAGES/DRESSINGS) ×2 IMPLANT
DRSG TEGADERM 4X4.75 (GAUZE/BANDAGES/DRESSINGS) ×2 IMPLANT
DRSG TELFA 4X3 1S NADH ST (GAUZE/BANDAGES/DRESSINGS) ×2 IMPLANT
ELECT CAUTERY BLADE 6.4 (BLADE) ×2 IMPLANT
ELECT REM PT RETURN 9FT ADLT (ELECTROSURGICAL) ×2
ELECTRODE REM PT RTRN 9FT ADLT (ELECTROSURGICAL) ×1 IMPLANT
GLOVE INDICATOR 8.0 STRL GRN (GLOVE) ×4 IMPLANT
GLOVE SURG ENC MOIS LTX SZ7.5 (GLOVE) ×4 IMPLANT
GOWN STRL REUS W/ TWL LRG LVL3 (GOWN DISPOSABLE) ×2 IMPLANT
GOWN STRL REUS W/TWL LRG LVL3 (GOWN DISPOSABLE) ×4
LABEL OR SOLS (LABEL) ×2 IMPLANT
MANIFOLD NEPTUNE II (INSTRUMENTS) ×2 IMPLANT
NEEDLE HYPO 22GX1.5 SAFETY (NEEDLE) ×2 IMPLANT
NEEDLE HYPO 25X1 1.5 SAFETY (NEEDLE) IMPLANT
NS IRRIG 500ML POUR BTL (IV SOLUTION) ×2 IMPLANT
PACK BASIN MINOR ARMC (MISCELLANEOUS) ×2 IMPLANT
SHEARS HARMONIC 9CM CVD (BLADE) ×2 IMPLANT
STRAP SAFETY 5IN WIDE (MISCELLANEOUS) IMPLANT
SUT VIC AB 2-0 CT2 27 (SUTURE) ×4 IMPLANT
SUT VIC AB 3-0 54X BRD REEL (SUTURE) ×1 IMPLANT
SUT VIC AB 3-0 BRD 54 (SUTURE) ×2
SUT VIC AB 3-0 SH 27 (SUTURE) ×4
SUT VIC AB 3-0 SH 27X BRD (SUTURE) ×2 IMPLANT
SUT VIC AB 4-0 PS2 18 (SUTURE) ×2 IMPLANT
SWABSTK COMLB BENZOIN TINCTURE (MISCELLANEOUS) ×2 IMPLANT
SYR 10ML LL (SYRINGE) ×2 IMPLANT
SYR BULB IRRIG 60ML STRL (SYRINGE) ×2 IMPLANT

## 2020-08-01 NOTE — Anesthesia Preprocedure Evaluation (Signed)
Anesthesia Evaluation  Patient identified by MRN, date of birth, ID band Patient awake    Reviewed: Allergy & Precautions, NPO status , Patient's Chart, lab work & pertinent test results  History of Anesthesia Complications Negative for: history of anesthetic complications  Airway Mallampati: II       Dental  (+) Missing, Poor Dentition   Pulmonary neg sleep apnea, neg COPD, Not current smoker,           Cardiovascular hypertension, Pt. on medications (-) Past MI and (-) CHF + dysrhythmias Supra Ventricular Tachycardia (-) Valvular Problems/Murmurs     Neuro/Psych neg Seizures    GI/Hepatic Neg liver ROS, neg GERD  ,  Endo/Other  neg diabetesHypothyroidism   Renal/GU negative Renal ROS     Musculoskeletal   Abdominal   Peds  Hematology   Anesthesia Other Findings   Reproductive/Obstetrics                             Anesthesia Physical Anesthesia Plan  ASA: III  Anesthesia Plan: General   Post-op Pain Management:    Induction: Intravenous  PONV Risk Score and Plan: 3 and Ondansetron and Dexamethasone  Airway Management Planned: LMA  Additional Equipment:   Intra-op Plan:   Post-operative Plan:   Informed Consent: I have reviewed the patients History and Physical, chart, labs and discussed the procedure including the risks, benefits and alternatives for the proposed anesthesia with the patient or authorized representative who has indicated his/her understanding and acceptance.       Plan Discussed with:   Anesthesia Plan Comments:         Anesthesia Quick Evaluation

## 2020-08-01 NOTE — H&P (Signed)
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   Medications Prior to Admission  Medication Sig Dispense Refill Last Dose  . allopurinol (ZYLOPRIM) 300 MG tablet Take 1 tablet (300 mg total) by mouth daily. 30 tablet 2 07/31/2020 at Unknown time  . amLODipine (NORVASC) 10 MG tablet Take 1 tablet by mouth daily.   08/01/2020 at Unknown time  . carvedilol (COREG) 12.5 MG tablet Take 1 tablet (12.5 mg total) by mouth 2 (two) times daily. 60 tablet 3 08/01/2020 at Unknown time  . cholecalciferol (VITAMIN D3) 25 MCG (1000 UNIT) tablet Take 2,000 Units by mouth daily.   07/31/2020 at Unknown time  . loratadine (CLARITIN) 10 MG tablet Take 10 mg by mouth daily as needed for allergies.   07/31/2020 at Unknown time  . pravastatin (PRAVACHOL) 40 MG tablet Take 40 mg by mouth daily.   07/31/2020 at Unknown time  . spironolactone (ALDACTONE) 50 MG tablet Take 50 mg by mouth daily.   07/31/2020 at Unknown time  . SYNTHROID 50 MCG tablet Take 1 tablet (50 mcg total) by mouth daily. 30 tablet 3 08/01/2020 at Unknown time   Allergies  Allergen Reactions  . Lisinopril Swelling    angioedema   Past Medical History:  Diagnosis Date  . Hx of hysterectomy   . Hyperlipidemia   . Hypertension   . Thyroid disease    rt thryroidectomy  09/25/2010   Past Surgical History:  Procedure Laterality Date  . BREAST BIOPSY Right 04/21/2020   hydromark 3 [place] bx path pending   Social History   Socioeconomic History  . Marital status: Married    Spouse name: Not on file  . Number of children: Not on file  . Years of education: Not on file  . Highest education level: Not on file  Occupational History  . Not on file  Tobacco Use  . Smoking status: Never Smoker  . Smokeless tobacco: Never Used  Vaping Use  . Vaping Use: Never used  Substance and Sexual Activity  . Alcohol use: No  . Drug use: No  . Sexual activity: Not on file  Other Topics Concern  . Not on  file  Social History Narrative  . Not on file   Social Determinants of Health   Financial Resource Strain: Not on file  Food Insecurity: Not on file  Transportation Needs: Not on file  Physical Activity: Not on file  Stress: Not on file  Social Connections: Not on file  Intimate Partner Violence: Not on file   Social History   Social History Narrative  . Not on file     ROS: Negative.     PE: HEENT: Negative. Lungs: Clear. Cardio: RR.   Assessment/Plan:  Proceed with planned left axillary node biopsy.    [facility] 08/01/2020

## 2020-08-01 NOTE — Transfer of Care (Signed)
Immediate Anesthesia Transfer of Care Note  Patient: Natasha Farrell  Procedure(s) Performed: LYMPH NODE BIOPSY (Left )  Patient Location: PACU  Anesthesia Type:General  Level of Consciousness: awake, alert  and oriented  Airway & Oxygen Therapy: non-rebreather face mask  Post-op Assessment: Report given to RN and Post -op Vital signs reviewed and stable  Post vital signs: stable  Last Vitals:  Vitals Value Taken Time  BP    Temp    Pulse    Resp    SpO2      Last Pain:  Vitals:   08/01/20 0720  TempSrc: Temporal  PainSc: 0-No pain         Complications: No complications documented.

## 2020-08-01 NOTE — Anesthesia Postprocedure Evaluation (Signed)
Anesthesia Post Note  Patient: Natasha Farrell  Procedure(s) Performed: LYMPH NODE BIOPSY (Left )  Patient location during evaluation: PACU Anesthesia Type: General Level of consciousness: awake and alert Pain management: pain level controlled Vital Signs Assessment: post-procedure vital signs reviewed and stable Respiratory status: spontaneous breathing and respiratory function stable Cardiovascular status: stable Anesthetic complications: no   No complications documented.   Last Vitals:  Vitals:   08/01/20 1000 08/01/20 1015  BP: 132/76 139/64  Pulse: 79 90  Resp: (!) 22 (!) 25  Temp:    SpO2: 94% 95%    Last Pain:  Vitals:   08/01/20 1015  TempSrc:   PainSc: 0-No pain                 Johnhenry Tippin K

## 2020-08-01 NOTE — Op Note (Signed)
Preoperative diagnosis: Suspected lymphoma.  Postoperative diagnosis: Same.  Operative procedure: Left axillary node biopsy with ultrasound guidance.  Operating surgeon: Hervey Ard, MD.  Anesthesia: General by LMA, Marcaine 0.5% with 1: 200,000 units of epinephrine.  Estimated blood loss: Less than 2 cc.  Clinical note: This 75 year old woman was noted to have diffuse lymphadenopathy and splenomegaly.  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  Prior core biopsy of the right axilla was not confirmatory.  She is brought to the operating room for planned left axillary biopsy.  SCD stockings for DVT prevention.  She received Ancef prior to the procedure due to her immune compromise.  Operative note: With the patient under adequate general anesthesia and a roll behind the left shoulder ultrasound was used to confirm the axillary mass.  Image obtained for permanent record.  Local anesthesia was infiltrated and a transverse incision in the lower third of the axillary hairline was made.  The skin was incised sharply remaining dissection completed electrocautery.  Once the axillary envelope was opened the harmonic scalpel was used to obtain good hemostasis and to minimize the risk of lymphatic leak.  The nodes were sent fresh to pathology for review.  The axillary envelope was closed with interrupted 2-0 Vicryl figure-of-eight sutures.  The adipose layer was approximated in similar fashion.  The skin was closed with a running 4-0 Vicryl subcuticular suture.  Benzoin, Steri-Strips, Telfa and Tegaderm dressing applied.  A compressive wrap with fluff gauze to provide pressure into the axillary fold was applied.  The patient had moderate hypoxemia in the immediate postoperative period requiring supplemental oxygen.  She was otherwise hemodynamically stable and taken to recovery room in stable condition.

## 2020-08-01 NOTE — Discharge Instructions (Signed)

## 2020-08-01 NOTE — Anesthesia Procedure Notes (Signed)
Procedure Name: LMA Insertion Date/Time: 08/01/2020 8:23 AM Performed by: Aline Brochure, CRNA Pre-anesthesia Checklist: Patient identified, Emergency Drugs available, Suction available and Patient being monitored Patient Re-evaluated:Patient Re-evaluated prior to induction Oxygen Delivery Method: Circle system utilized Preoxygenation: Pre-oxygenation with 100% oxygen Induction Type: IV induction Ventilation: Mask ventilation without difficulty LMA: LMA inserted LMA Size: 3.5 Number of attempts: 1 Placement Confirmation: positive ETCO2 and breath sounds checked- equal and bilateral Tube secured with: Tape Dental Injury: Teeth and Oropharynx as per pre-operative assessment

## 2020-08-04 ENCOUNTER — Ambulatory Visit
Admission: RE | Admit: 2020-08-04 | Discharge: 2020-08-04 | Disposition: A | Discharge status: Home / Self Care | Payer: Medicare HMO | Source: Ambulatory Visit | Attending: Hematology and Oncology | Admitting: Hematology and Oncology

## 2020-08-04 ENCOUNTER — Telehealth: Payer: Self-pay

## 2020-08-04 ENCOUNTER — Other Ambulatory Visit: Payer: Self-pay

## 2020-08-04 DIAGNOSIS — C8590 Non-Hodgkin lymphoma, unspecified, unspecified site: Secondary | ICD-10-CM | POA: Diagnosis not present

## 2020-08-04 DIAGNOSIS — Z90710 Acquired absence of both cervix and uterus: Secondary | ICD-10-CM | POA: Diagnosis not present

## 2020-08-04 DIAGNOSIS — R161 Splenomegaly, not elsewhere classified: Secondary | ICD-10-CM

## 2020-08-04 DIAGNOSIS — Z79890 Hormone replacement therapy (postmenopausal): Secondary | ICD-10-CM | POA: Diagnosis not present

## 2020-08-04 DIAGNOSIS — E785 Hyperlipidemia, unspecified: Secondary | ICD-10-CM | POA: Insufficient documentation

## 2020-08-04 DIAGNOSIS — I10 Essential (primary) hypertension: Secondary | ICD-10-CM | POA: Diagnosis not present

## 2020-08-04 DIAGNOSIS — Z8051 Family history of malignant neoplasm of kidney: Secondary | ICD-10-CM | POA: Insufficient documentation

## 2020-08-04 DIAGNOSIS — Z803 Family history of malignant neoplasm of breast: Secondary | ICD-10-CM | POA: Insufficient documentation

## 2020-08-04 DIAGNOSIS — Z800 Family history of malignant neoplasm of digestive organs: Secondary | ICD-10-CM | POA: Diagnosis not present

## 2020-08-04 DIAGNOSIS — Z888 Allergy status to other drugs, medicaments and biological substances status: Secondary | ICD-10-CM | POA: Insufficient documentation

## 2020-08-04 DIAGNOSIS — Z79899 Other long term (current) drug therapy: Secondary | ICD-10-CM | POA: Insufficient documentation

## 2020-08-04 DIAGNOSIS — R599 Enlarged lymph nodes, unspecified: Secondary | ICD-10-CM

## 2020-08-04 LAB — CBC WITH DIFFERENTIAL/PLATELET
Abs Immature Granulocytes: 0.37 10*3/uL — ABNORMAL HIGH (ref 0.00–0.07)
Basophils Absolute: 0 10*3/uL (ref 0.0–0.1)
Basophils Relative: 1 %
Eosinophils Absolute: 0.2 10*3/uL (ref 0.0–0.5)
Eosinophils Relative: 2 %
HCT: 34.2 % — ABNORMAL LOW (ref 36.0–46.0)
Hemoglobin: 11.5 g/dL — ABNORMAL LOW (ref 12.0–15.0)
Immature Granulocytes: 4 %
Lymphocytes Relative: 21 %
Lymphs Abs: 1.8 10*3/uL (ref 0.7–4.0)
MCH: 29.2 pg (ref 26.0–34.0)
MCHC: 33.6 g/dL (ref 30.0–36.0)
MCV: 86.8 fL (ref 80.0–100.0)
Monocytes Absolute: 1.2 10*3/uL — ABNORMAL HIGH (ref 0.1–1.0)
Monocytes Relative: 14 %
Neutro Abs: 5 10*3/uL (ref 1.7–7.7)
Neutrophils Relative %: 58 %
Platelets: 65 10*3/uL — ABNORMAL LOW (ref 150–400)
RBC: 3.94 MIL/uL (ref 3.87–5.11)
RDW: 16 % — ABNORMAL HIGH (ref 11.5–15.5)
WBC: 8.6 10*3/uL (ref 4.0–10.5)
nRBC: 0 % (ref 0.0–0.2)

## 2020-08-04 MED ORDER — MIDAZOLAM HCL 2 MG/2ML IJ SOLN
INTRAMUSCULAR | Status: AC | PRN
  Administered 2020-08-04 (×2): 1 mg via INTRAVENOUS

## 2020-08-04 MED ORDER — FENTANYL CITRATE (PF) 100 MCG/2ML IJ SOLN
INTRAMUSCULAR | Status: AC | PRN
  Administered 2020-08-04 (×2): 50 ug via INTRAVENOUS

## 2020-08-04 MED ORDER — HEPARIN SOD (PORK) LOCK FLUSH 100 UNIT/ML IV SOLN
INTRAVENOUS | Status: AC
  Filled 2020-08-04: qty 5

## 2020-08-04 MED ORDER — SODIUM CHLORIDE 0.9 % IV SOLN: INTRAVENOUS | Status: DC

## 2020-08-04 MED ORDER — MIDAZOLAM HCL 2 MG/2ML IJ SOLN
INTRAMUSCULAR | Status: AC
  Filled 2020-08-04: qty 2

## 2020-08-04 MED ORDER — FENTANYL CITRATE (PF) 100 MCG/2ML IJ SOLN
INTRAMUSCULAR | Status: AC
  Filled 2020-08-04: qty 2

## 2020-08-04 NOTE — Discharge Instructions (Signed)
Bone Marrow Aspiration and Bone Marrow Biopsy, Adult, Care After This sheet gives you information about how to care for yourself after your procedure. Your health care provider may also give you more specific instructions. If you have problems or questions, contact your health care provider. What can I expect after the procedure? After the procedure, it is common to have:  Mild pain and tenderness.  Swelling.  Bruising. Follow these instructions at home: Puncture site care  Follow instructions from your health care provider about how to take care of the puncture site. Make sure you: ? Wash your hands with soap and water before and after you change your bandage (dressing). If soap and water are not available, use hand sanitizer. ? Change your dressing as told by your health care provider.  Check your puncture site every day for signs of infection. Check for: ? More redness, swelling, or pain. ? Fluid or blood. ? Warmth. ? Pus or a bad smell.   Activity  Return to your normal activities as told by your health care provider. Ask your health care provider what activities are safe for you.  Do not lift anything that is heavier than 10 lb (4.5 kg), or the limit that you are told, until your health care provider says that it is safe.  Do not drive for 24 hours if you were given a sedative during your procedure. General instructions  Take over-the-counter and prescription medicines only as told by your health care provider.  Do not take baths, swim, or use a hot tub until your health care provider approves. Ask your health care provider if you may take showers. You may only be allowed to take sponge baths.  If directed, put ice on the affected area. To do this: ? Put ice in a plastic bag. ? Place a towel between your skin and the bag. ? Leave the ice on for 20 minutes, 2-3 times a day.  Keep all follow-up visits as told by your health care provider. This is important.   Contact a  health care provider if:  Your pain is not controlled with medicine.  You have a fever.  You have more redness, swelling, or pain around the puncture site.  You have fluid or blood coming from the puncture site.  Your puncture site feels warm to the touch.  You have pus or a bad smell coming from the puncture site. Summary  After the procedure, it is common to have mild pain, tenderness, swelling, and bruising.  Follow instructions from your health care provider about how to take care of the puncture site and what activities are safe for you.  Take over-the-counter and prescription medicines only as told by your health care provider.  Contact a health care provider if you have any signs of infection, such as fluid or blood coming from the puncture site. This information is not intended to replace advice given to you by your health care provider. Make sure you discuss any questions you have with your health care provider. Document Revised: 09/26/2018 Document Reviewed: 09/26/2018 Elsevier Patient Education  2021 [employer]  

## 2020-08-04 NOTE — Progress Notes (Signed)
Clinically stable post BMB per Dr Pascal Lux, tolerated well. Denies complaints at this time. Vitals stable pre and post procedure. Received Versed 2 mg along with Fentanyl 100 mcg IV for procedure. Report given to Carlynn Spry RN in specials.

## 2020-08-04 NOTE — Consult Note (Signed)
Chief Complaint: Concern for lymphoma  Referring Physician(s): Alta C  Patient Status: [facility] - Out-pt  History of Present Illness: Natasha Farrell is a 75 y.o. female with past medical history significant for hyperlipidemia and hypertension with recent diagnosis of lymphoma who presents today for CT-guided bone marrow biopsy for tissue diagnostic purposes.  The patient is currently without complaint.  Specifically, no change in appetite or energy level.  No chest pain or shortness of breath.  No fever or chills.  Past Medical History:  Diagnosis Date  . Hx of hysterectomy   . Hyperlipidemia   . Hypertension   . Thyroid disease    rt thryroidectomy  09/25/2010    Past Surgical History:  Procedure Laterality Date  . BREAST BIOPSY Right 04/21/2020   hydromark 3 [place] bx path pending  . LYMPH NODE BIOPSY Left 08/01/2020   Procedure: LYMPH NODE BIOPSY;  Surgeon: Robert Bellow, MD;  Location: [facility] ORS;  Service: General;  Laterality: Left;    Allergies: Lisinopril  Medications: Prior to Admission medications   Medication Sig Start Date End Date Taking? Authorizing Provider  allopurinol (ZYLOPRIM) 300 MG tablet Take 1 tablet (300 mg total) by mouth daily. 07/28/20  Yes Corcoran, Drue Second, MD  amLODipine (NORVASC) 10 MG tablet Take 1 tablet by mouth daily. 04/29/20  Yes [provider]  carvedilol (COREG) 12.5 MG tablet Take 1 tablet (12.5 mg total) by mouth 2 (two) times daily. 07/23/20 10/21/20 Yes End, Harrell Gave, MD  cholecalciferol (VITAMIN D3) 25 MCG (1000 UNIT) tablet Take 2,000 Units by mouth daily.   Yes [provider]  HYDROcodone-acetaminophen (NORCO/VICODIN) 5-325 MG tablet Take 1 tablet by mouth every 4 (four) hours as needed for moderate pain. 08/01/20 08/01/21 Yes Byrnett, Forest Gleason, MD  loratadine (CLARITIN) 10 MG tablet Take 10 mg by mouth daily as needed for allergies.   Yes [provider]  pravastatin (PRAVACHOL) 40 MG tablet  Take 40 mg by mouth daily.   Yes [provider]  spironolactone (ALDACTONE) 50 MG tablet Take 50 mg by mouth daily. 07/25/20  Yes [provider]  SYNTHROID 50 MCG tablet Take 1 tablet (50 mcg total) by mouth daily. 12/09/10 12/09/11  Armandina Gemma, MD     Family History  Problem Relation Age of Onset  . Breast cancer Sister 19  . Colon cancer Sister   . Kidney cancer Sister     Social History   Socioeconomic History  . Marital status: Married    Spouse name: Not on file  . Number of children: Not on file  . Years of education: Not on file  . Highest education level: Not on file  Occupational History  . Not on file  Tobacco Use  . Smoking status: Never Smoker  . Smokeless tobacco: Never Used  Vaping Use  . Vaping Use: Never used  Substance and Sexual Activity  . Alcohol use: No  . Drug use: No  . Sexual activity: Not on file  Other Topics Concern  . Not on file  Social History Narrative  . Not on file   Social Determinants of Health   Financial Resource Strain: Not on file  Food Insecurity: Not on file  Transportation Needs: Not on file  Physical Activity: Not on file  Stress: Not on file  Social Connections: Not on file    ECOG Status: 0 - Asymptomatic  Review of Systems: A 12 point ROS discussed and pertinent positives are indicated in the HPI above.  All other systems are negative.  Review of Systems  Vital Signs: BP 130/85   Pulse 96   Temp 97.6 F (36.4 C) (Oral)   Resp 20   Ht 5' 4"  (1.626 m)   Wt 78 kg   SpO2 100%   BMI 29.52 kg/m   Physical Exam  Imaging: CT Angio Chest PE W/Cm &/Or Wo Cm  Result Date: 07/21/2020 CLINICAL DATA:  Generalized weakness, loss of appetite, chest pain with exertion EXAM: CT ANGIOGRAPHY CHEST WITH CONTRAST TECHNIQUE: Multidetector CT imaging of the chest was performed using the standard protocol during bolus administration of intravenous contrast. Multiplanar CT image reconstructions and MIPs were  obtained to evaluate the vascular anatomy. CONTRAST:  60m OMNIPAQUE IOHEXOL 350 MG/ML SOLN COMPARISON:  None. FINDINGS: Cardiovascular: This is a technically adequate evaluation of the pulmonary vasculature. There are no filling defects or pulmonary emboli. The heart is unremarkable without pericardial effusion. Unremarkable thoracic aorta without aneurysm or dissection. Minimal atherosclerosis. Mediastinum/Nodes: There is pathologic adenopathy within the bilateral jugular chains, mediastinum, bilateral axilla. Largest lymph node in the right axilla measures 21 x 16 mm image 24/4, and in the left axilla measures 30 x 15 mm image 11/4. Enlarged lymph node interposed between the brachiocephalic vein and left subclavian artery measures 21 x 16 mm reference image 13 of series 4. The thyroid, trachea, and esophagus are unremarkable. Lungs/Pleura: There is a small left pleural effusion. Minimal dependent atelectasis is seen within the lower lobes bilaterally, left greater than right. No acute airspace disease or pneumothorax. The central airways are patent. Upper Abdomen: There is massive splenomegaly. Pathologic adenopathy is seen within the central upper mesentery and retroperitoneum, incompletely evaluated on this study. Epiphrenic adenopathy also noted anterior to the bilateral hemidiaphragms. Musculoskeletal: No acute or destructive bony lesions. Reconstructed images demonstrate no additional findings. Review of the MIP images confirms the above findings. IMPRESSION: 1. Extensive lymphadenopathy throughout the neck, chest, and visualized portions of the abdomen, along with marked splenomegaly. Overall, findings favor leukemia or lymphoma. 2. No evidence of pulmonary embolus. 3. Small left pleural effusion. 4.  Aortic Atherosclerosis (ICD10-I70.0). Electronically Signed   By: MRanda NgoM.D.   On: 07/21/2020 15:25   DG Chest Port 1 View  Result Date: 08/01/2020 CLINICAL DATA:  Low oxygen saturation after  biopsy. Postop left axillary lymph node biopsy EXAM: PORTABLE CHEST 1 VIEW COMPARISON:  CT chest 07/21/2020 FINDINGS: Moderate large left pleural effusion has progressed. Progressive left lower lobe consolidation. No pneumothorax Small right effusion and mild right lower lobe atelectasis. Vascularity normal. IMPRESSION: Progressive left pleural effusion and left lower lobe consolidation. Small right effusion and mild right lower lobe atelectasis. Electronically Signed   By: CFranchot GalloM.D.   On: 08/01/2020 11:39   [place] LTD UNI LEFT INC AXILLA  Result Date: 07/24/2020 CLINICAL DATA:  Three-month follow-up of bilateral lymphadenopathy. Patient was biopsied in November with benign results. However there has been continued increase in axillary lymphadenopathy and splenomegaly. EXAM: ULTRASOUND OF THE BILATERAL BREAST COMPARISON:  Previous exam(s). FINDINGS: Targeted ultrasound was performed of the RIGHT axilla. There are multiple enlarged RIGHT axillary lymph nodes with diffusely thickened cortices and benign echogenic hila. Representative cortical thickness measures up to 8 mm. This is increased in comparison to prior. Biopsy clip is noted within an axillary lymph node with a diffusely thickened cortex which was previously biopsied with benign results. Targeted ultrasound was performed of the LEFT axilla. There are multiple enlarged LEFT axillary lymph nodes with diffusely  thickened cortices and benign echogenic hila. Representative cortex measures 8 mm in thickness. This is increased in comparison to prior. IMPRESSION: 1. There is continued increase in bilateral axillary lymphadenopathy. Given systemic increase in adenopathy, this remains concerning for an underlying lymphoproliferative process. Previous ultrasound-guided biopsy of a RIGHT axillary lymph node was nonrevealing. Patient is scheduled to meet with Dr. Mike Gip (oncologist) next week. Recommend management of axillary lymph nodes be determined  by oncologist. The axillary lymph nodes are amenable for repeat ultrasound-guided biopsy if clinically desired. RECOMMENDATION: Recommend management of axillary lymphadenopathy be determined by oncologist. Axillary lymph nodes are amenable for ultrasound-guided biopsy. Recommend sampling of the LEFT axilla if ultrasound-guided biopsy is reattempted. I have discussed the findings and recommendations with the patient. If applicable, a reminder letter will be sent to the patient regarding the next appointment. BI-RADS CATEGORY  4: Suspicious. Electronically Signed   By: Valentino Saxon MD   On: 07/24/2020 10:37   US BREAST LTD UNI RIGHT INC AXILLA  Result Date: 07/24/2020 CLINICAL DATA:  Three-month follow-up of bilateral lymphadenopathy. Patient was biopsied in November with benign results. However there has been continued increase in axillary lymphadenopathy and splenomegaly. EXAM: ULTRASOUND OF THE BILATERAL BREAST COMPARISON:  Previous exam(s). FINDINGS: Targeted ultrasound was performed of the RIGHT axilla. There are multiple enlarged RIGHT axillary lymph nodes with diffusely thickened cortices and benign echogenic hila. Representative cortical thickness measures up to 8 mm. This is increased in comparison to prior. Biopsy clip is noted within an axillary lymph node with a diffusely thickened cortex which was previously biopsied with benign results. Targeted ultrasound was performed of the LEFT axilla. There are multiple enlarged LEFT axillary lymph nodes with diffusely thickened cortices and benign echogenic hila. Representative cortex measures 8 mm in thickness. This is increased in comparison to prior. IMPRESSION: 1. There is continued increase in bilateral axillary lymphadenopathy. Given systemic increase in adenopathy, this remains concerning for an underlying lymphoproliferative process. Previous ultrasound-guided biopsy of a RIGHT axillary lymph node was nonrevealing. Patient is scheduled to meet with  Dr. Mike Gip (oncologist) next week. Recommend management of axillary lymph nodes be determined by oncologist. The axillary lymph nodes are amenable for repeat ultrasound-guided biopsy if clinically desired. RECOMMENDATION: Recommend management of axillary lymphadenopathy be determined by oncologist. Axillary lymph nodes are amenable for ultrasound-guided biopsy. Recommend sampling of the LEFT axilla if ultrasound-guided biopsy is reattempted. I have discussed the findings and recommendations with the patient. If applicable, a reminder letter will be sent to the patient regarding the next appointment. BI-RADS CATEGORY  4: Suspicious. Electronically Signed   By: Valentino Saxon MD   On: 07/24/2020 10:37   ECHOCARDIOGRAM COMPLETE  Result Date: 07/24/2020    ECHOCARDIOGRAM REPORT   Patient Name:   Natasha Farrell Date of Exam: 07/24/2020 Medical Rec #:  [id number]      Height:       64.0 in Accession #:    [id number]     Weight:       175.0 lb Date of Birth:  1946-01-14     BSA:          1.848 m Patient Age:    86 years       BP:           156/60 mmHg Patient Gender: F              HR:           116-echo ECG bpm. Exam Location:  [facility] Procedure: 2D  Echo, Strain Analysis, Color Doppler and Cardiac Doppler Indications:     Tachycardia,                  Shortness of breath  History:         Patient has no prior history of Echocardiogram examinations.                  Risk Factors:Hypertension and Dyslipidemia.  Sonographer:     Sherrie Sport RDCS (AE) Referring Phys:  [phone] CHRISTOPHER END Diagnosing Phys: Ida Rogue MD  Sonographer Comments: Global longitudinal strain was attempted. IMPRESSIONS  1. Left ventricular ejection fraction, by estimation, is 60 to 65%. The left ventricle has normal function. The left ventricle has no regional wall motion abnormalities. Left ventricular diastolic parameters are consistent with Grade I diastolic dysfunction (impaired relaxation). The average left ventricular global longitudinal  strain is -11.7 %. The global longitudinal strain is abnormal.  2. Right ventricular systolic function is normal. The right ventricular size is normal. There is mildly elevated pulmonary artery systolic pressure. The estimated right ventricular systolic pressure is 74.0 mmHg.  3. Left pleural effusion noted, 7 cm FINDINGS  Left Ventricle: Left ventricular ejection fraction, by estimation, is 60 to 65%. The left ventricle has normal function. The left ventricle has no regional wall motion abnormalities. The average left ventricular global longitudinal strain is -11.7 %. The global longitudinal strain is abnormal. The left ventricular internal cavity size was normal in size. There is no left ventricular hypertrophy. Left ventricular diastolic parameters are consistent with Grade I diastolic dysfunction (impaired relaxation). Right Ventricle: The right ventricular size is normal. No increase in right ventricular wall thickness. Right ventricular systolic function is normal. There is mildly elevated pulmonary artery systolic pressure. The tricuspid regurgitant velocity is 2.90  m/s, and with an assumed right atrial pressure of 5 mmHg, the estimated right ventricular systolic pressure is 81.4 mmHg. Left Atrium: Left atrial size was normal in size. Right Atrium: Right atrial size was normal in size. Pericardium: There is no evidence of pericardial effusion. Mitral Valve: The mitral valve is normal in structure. No evidence of mitral valve regurgitation. No evidence of mitral valve stenosis. Tricuspid Valve: The tricuspid valve is normal in structure. Tricuspid valve regurgitation is not demonstrated. No evidence of tricuspid stenosis. Aortic Valve: The aortic valve is normal in structure. Aortic valve regurgitation is not visualized. No aortic stenosis is present. Aortic valve mean gradient measures 4.0 mmHg. Aortic valve peak gradient measures 5.4 mmHg. Aortic valve area, by VTI measures 3.06 cm. Pulmonic Valve: The  pulmonic valve was normal in structure. Pulmonic valve regurgitation is not visualized. No evidence of pulmonic stenosis. Aorta: The aortic root is normal in size and structure. Venous: The inferior vena cava is normal in size with greater than 50% respiratory variability, suggesting right atrial pressure of 3 mmHg. IAS/Shunts: No atrial level shunt detected by color flow Doppler.  LEFT VENTRICLE PLAX 2D LVIDd:         3.92 cm  Diastology LVIDs:         2.77 cm  LV e' medial:    6.64 cm/s LV PW:         1.11 cm  LV E/e' medial:  8.4 LV IVS:        0.81 cm  LV e' lateral:   9.03 cm/s LVOT diam:     2.20 cm  LV E/e' lateral: 6.2 LV SV:         68  LV SV Index:   37       2D Longitudinal Strain LVOT Area:     3.80 cm 2D Strain GLS Avg:     -11.7 %                          3D Volume EF:                         3D EF:        56 %                         LV EDV:       68 ml                         LV ESV:       30 ml                         LV SV:        38 ml RIGHT VENTRICLE RV Basal diam:  3.02 cm RV S prime:     17.00 cm/s TAPSE (M-mode): 3.8 cm LEFT ATRIUM           Index       RIGHT ATRIUM          Index LA diam:      2.60 cm 1.41 cm/m  RA Area:     9.40 cm LA Vol (A2C): 19.8 ml 10.71 ml/m RA Volume:   16.30 ml 8.82 ml/m LA Vol (A4C): 24.9 ml 13.47 ml/m  AORTIC VALVE                   PULMONIC VALVE AV Area (Vmax):    3.34 cm    PV Vmax:        0.75 m/s AV Area (Vmean):   2.94 cm    PV Peak grad:   2.2 mmHg AV Area (VTI):     3.06 cm    RVOT Peak grad: 3 mmHg AV Vmax:           116.00 cm/s AV Vmean:          91.100 cm/s AV VTI:            0.221 m AV Peak Grad:      5.4 mmHg AV Mean Grad:      4.0 mmHg LVOT Vmax:         102.00 cm/s LVOT Vmean:        70.500 cm/s LVOT VTI:          0.178 m LVOT/AV VTI ratio: 0.81  AORTA Ao Root diam: 3.20 cm MITRAL VALVE               TRICUSPID VALVE MV Area (PHT): 5.62 cm    TR Peak grad:   33.6 mmHg MV Decel Time: 135 msec    TR Vmax:        290.00 cm/s MV E velocity: 56.10  cm/s MV A velocity: 87.40 cm/s  SHUNTS MV E/A ratio:  0.64        Systemic VTI:  0.18 m                            Systemic Diam: 2.20 cm Ida Rogue MD Electronically signed by  Ida Rogue MD Signature Date/Time: 07/24/2020/8:41:02 PM    Final     Labs:  CBC: Recent Labs    07/21/20 1124 07/28/20 1531  WBC 8.8 8.5  HGB 12.9 12.3  12.6  HCT 38.1 36.4  PLT 68* 63*    COAGS: Recent Labs    07/28/20 1531  INR 1.2    BMP: Recent Labs    07/21/20 1124 07/28/20 1531  NA 134* 132*  K 3.8 3.5  CL 99 93*  CO2 23 24  GLUCOSE 99 100*  BUN 9 7*  CALCIUM 10.2 10.1  CREATININE 0.93 0.81  GFRNONAA >60 >60    LIVER FUNCTION TESTS: Recent Labs    07/28/20 1531  BILITOT 1.2  AST 34  ALT 15  ALKPHOS 96  PROT 9.0*  ALBUMIN 4.5    TUMOR MARKERS: No results for input(s): AFPTM, CEA, CA199, CHROMGRNA in the last 8760 hours.  Assessment and Plan:  Natasha Farrell is a 75 y.o. female with past medical history significant for hyperlipidemia and hypertension with recent diagnosis of lymphoma who presents today for CT-guided bone marrow biopsy for tissue diagnostic purposes.  The patient is currently without complaint.    Risks and benefits of CT guided BM Bx was discussed with the patient and/or patient's family including, but not limited to bleeding, infection, damage to adjacent structures or low yield requiring additional tests.  All of the questions were answered and there is agreement to proceed.  Consent signed and in chart.  Thank you for this interesting consult.  I greatly enjoyed meeting AMILIANA FOUTZ and look forward to participating in their care.  A copy of this report was sent to the requesting provider on this date.  Electronically Signed: Sandi Mariscal, MD 08/04/2020, 8:23 AM   I spent a total of 15 Minutes in face to face in clinical consultation, greater than 50% of which was counseling/coordinating care for CT guided BM Bx.

## 2020-08-04 NOTE — Procedures (Signed)
Pre-procedure Diagnosis: Lymphoma Post-procedure Diagnosis: Same  Technically successful CT guided bone marrow aspiration and biopsy of left iliac crest.   Complications: None Immediate EBL: None  Signed: Sandi Mariscal Pager: [phone] 08/04/2020, 9:34 AM

## 2020-08-06 ENCOUNTER — Other Ambulatory Visit: Payer: Self-pay

## 2020-08-06 ENCOUNTER — Encounter: Payer: Self-pay | Admitting: Medical

## 2020-08-06 ENCOUNTER — Ambulatory Visit: Payer: Medicare HMO | Admitting: Medical

## 2020-08-06 VITALS — BP 158/68 | HR 105 | Ht 64.0 in | Wt 174.0 lb

## 2020-08-06 DIAGNOSIS — R000 Tachycardia, unspecified: Secondary | ICD-10-CM | POA: Diagnosis not present

## 2020-08-06 DIAGNOSIS — R0602 Shortness of breath: Secondary | ICD-10-CM | POA: Diagnosis not present

## 2020-08-06 DIAGNOSIS — I10 Essential (primary) hypertension: Secondary | ICD-10-CM

## 2020-08-06 LAB — SURGICAL PATHOLOGY

## 2020-08-06 MED ORDER — CARVEDILOL 25 MG PO TABS: 25.0000 mg | ORAL_TABLET | Freq: Two times a day (BID) | ORAL | 3 refills | Status: DC

## 2020-08-06 NOTE — Progress Notes (Signed)
Cardiology Office Note:    Date:  08/06/2020   ID:  Natasha Farrell, DOB 1946-01-03, MRN [id number]  PCP:  [name], MD  [facility] Cardiologist:  Nelva Bush, MD  [facility] Electrophysiologist:  None   Referring MD: [name], MD   Chief Complaint: Follow-up for echo  History of Present Illness:    Natasha Farrell is a 75 y.o. female with a hx of HTN, HLD, thyroid disease s/p right thyroidectomy 2012 who presents for follow-up.  February the patient presented to [facility] for tachycardia dating back to early January when she complained of 4 days of headache, fever, chills, cough, fatigue, night sweats, dizziness.  In the ED CT of the chest showed widespread lymphadenopathy in the neck, chest, and visualized portions of the upper abdomen along with marked splenomegaly, no PE. Findings were concerning for lymphoma or leukemia. EKG showed sinus tachycardia.  She was seen by Dr. Saunders Revel 07/23/2020 and reported elevated heart rates worse with activity. He also reported occasional chest tightness and shortness of breath. She had been on metoprolol and escalation was tried although this caused worsening fatigue. Heart rate was 139 bpm.  ER evaluation was recommended, although patient deferred. An emergent echo was ordered. Metoprolol was switched to Coreg. Concern that underlying process causing symptoms.   Echo 07/24/20 showed LVEF 60-65%, no WMA, Z1IR, RV systolic normal function, left pleural effusion.  Since the last visit she had a biopsy of lymph nodes and of the bone marrow. She has a PET scan on Tuesday of next. She has follow-up next Thursday. 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   Today, the patient she has been overall doing well. Yesterday she had some stomach issues from something she ate the day before. She had diarrhea and vomiting. Energy was low. She is feeling better today and was able to eat solid food this morning. She has been hydrating well. She denies chest pain. Does  have shortness of breath, but it has improved since starting coreg. CXR 3/11 showed progressive left pleural effusion and left lower lob consolidation and small right effusion. Heart rate today is better around 106, it was also noted to have improved at the oncologist office. Denies lower leg edema, orthopnea, pnd. No lightheadedness or dizziness. BP today is higher. At home it has been lower since starting covid, generally 130/60. She sees her PCP next week, who will also look at blood pressures.  Past Medical History:  Diagnosis Date  . Hx of hysterectomy   . Hyperlipidemia   . Hypertension   . Thyroid disease    rt thryroidectomy  09/25/2010    Past Surgical History:  Procedure Laterality Date  . BREAST BIOPSY Right 04/21/2020   hydromark 3 [place] bx path pending  . LYMPH NODE BIOPSY Left 08/01/2020   Procedure: LYMPH NODE BIOPSY;  Surgeon: Robert Bellow, MD;  Location: [facility] ORS;  Service: General;  Laterality: Left;    Current Medications: Current Meds  Medication Sig  . allopurinol (ZYLOPRIM) 300 MG tablet Take 1 tablet (300 mg total) by mouth daily.  Marland Kitchen amLODipine (NORVASC) 10 MG tablet Take 1 tablet by mouth daily.  . cholecalciferol (VITAMIN D3) 25 MCG (1000 UNIT) tablet Take 2,000 Units by mouth daily.  Marland Kitchen loratadine (CLARITIN) 10 MG tablet Take 10 mg by mouth daily as needed for allergies.  . pravastatin (PRAVACHOL) 40 MG tablet Take 40 mg by mouth daily.  Marland Kitchen spironolactone (ALDACTONE) 50 MG tablet Take 50 mg by mouth daily.  . [  DISCONTINUED] carvedilol (COREG) 12.5 MG tablet Take 1 tablet (12.5 mg total) by mouth 2 (two) times daily.     Allergies:   Lisinopril   Social History   Socioeconomic History  . Marital status: Married    Spouse name: Not on file  . Number of children: Not on file  . Years of education: Not on file  . Highest education level: Not on file  Occupational History  . Not on file  Tobacco Use  . Smoking status: Never Smoker  . Smokeless tobacco:  Never Used  Vaping Use  . Vaping Use: Never used  Substance and Sexual Activity  . Alcohol use: No  . Drug use: No  . Sexual activity: Not on file  Other Topics Concern  . Not on file  Social History Narrative  . Not on file   Social Determinants of Health   Financial Resource Strain: Not on file  Food Insecurity: Not on file  Transportation Needs: Not on file  Physical Activity: Not on file  Stress: Not on file  Social Connections: Not on file     Family History: The patient's family history includes Breast cancer (age of onset: 49) in her sister; Colon cancer in her sister; Kidney cancer in her sister.  ROS:   Please see the history of present illness.    All other systems reviewed and are negative.  EKGs/Labs/Other Studies Reviewed:    The following studies were reviewed today:  Echo 07/24/20 1. Left ventricular ejection fraction, by estimation, is 60 to 65%. The  left ventricle has normal function. The left ventricle has no regional  wall motion abnormalities. Left ventricular diastolic parameters are  consistent with Grade I diastolic  dysfunction (impaired relaxation). The average left ventricular global  longitudinal strain is -11.7 %. The global longitudinal strain is  abnormal.  2. Right ventricular systolic function is normal. The right ventricular  size is normal. There is mildly elevated pulmonary artery systolic  pressure. The estimated right ventricular systolic pressure is 84.6 mmHg.  3. Left pleural effusion noted, 7 cm   EKG:  EKG is  ordered today.  The ekg ordered today demonstrates St, 106bpm  Recent Labs: 07/28/2020: ALT 15; BUN 7; Creatinine, Ser 0.81; Potassium 3.5; Sodium 132 08/04/2020: Hemoglobin 11.5; Platelets 65  Recent Lipid Panel No results found for: CHOL, TRIG, HDL, CHOLHDL, VLDL, LDLCALC, LDLDIRECT   Physical Exam:    VS:  BP (!) 158/68   Pulse (!) 105   Ht 5\' 4"  (1.626 m)   Wt 174 lb (78.9 kg)   BMI 29.87 kg/m     Wt  Readings from Last 3 Encounters:  08/06/20 174 lb (78.9 kg)  08/04/20 172 lb (78 kg)  07/28/20 175 lb (79.4 kg)     GEN:  Well nourished, well developed in no acute distress HEENT: Normal NECK: No JVD; No carotid bruits LYMPHATICS: No lymphadenopathy CARDIAC: RR, ST, no murmurs, rubs, gallops RESPIRATORY:  Diminished L>R ABDOMEN: Soft, non-tender, non-distended MUSCULOSKELETAL:  No edema; No deformity  SKIN: Warm and dry NEUROLOGIC:  Alert and oriented x 3 PSYCHIATRIC:  Normal affect   ASSESSMENT:    1. Sinus tachycardia   2. Essential hypertension   3. Shortness of breath    PLAN:    In order of problems listed above:  Sinus tachycardia Heart rate is better but still elevated. Echo reviewed during the visit. Oxygen is 96% today. No chest pain or lower leg pain. Does have shortness of breath on  exertion, but she has improved on Coreg. Also had nasuea and vomiting yesterday, she has been trying to drink more water. Overall suspicion that heart rate is secondary to underlying cancer issues.I will increase coreg to 25mg  BID.  Shortness of breath She reports breathing is better since starting coreg, however still seems mildly short of breath during conversation. Denies chest pain. CXR reviewed from 08/02/19 which showed progressive left pleural effusion and left lower lob consolidation and small right effusion. O2 wnl. Recommend to not hesitate to go to the ER if breathing acutely worsens. She has follow-up with oncology and PCP next week.   HTN BP elevated today. It has been better at home since starting coreg,a round 130/60s. Heart rate is still elevated, but better. Increase coreg to 25mg  BID as above.   Disposition: Follow up in 3 week(s) with APP    Signed, Kimimila Tauzin Ninfa Meeker, PA-C  08/06/2020 3:05 PM    [facility]

## 2020-08-06 NOTE — Patient Instructions (Signed)
Medication Instructions:  Your physician has recommended you make the following change in your medication:   INCREASE Carvedilol (Coreg) to 25mg  TWICE daily  *If you need a refill on your cardiac medications before your next appointment, please call your pharmacy*   Lab Work: None ordered   Testing/Procedures: None ordered   Follow-Up: At [facility], you and your health needs are our priority.  As part of our continuing mission to provide you with exceptional heart care, we have created designated Provider Care Teams.  These Care Teams include your primary Cardiologist (physician) and Advanced Practice Providers (APPs -  Physician Assistants and Nurse Practitioners) who all work together to provide you with the care you need, when you need it.  We recommend signing up for the patient portal called "MyChart".  Sign up information is provided on this After Visit Summary.  MyChart is used to connect with patients for Virtual Visits (Telemedicine).  Patients are able to view lab/test results, encounter notes, upcoming appointments, etc.  Non-urgent messages can be sent to your provider as well.   To learn more about what you can do with MyChart, go to [email or web address].    Your next appointment:   3 week(s)  The format for your next appointment:   In Person  Provider:   You may see Dr. Saunders Revel or one of the following Advanced Practice Providers on your designated Care Team:    Murray Hodgkins, NP  Christell Faith, PA-C  Marrianne Mood, PA-C  Cadence Loughman, [place]  Laurann Montana, NP

## 2020-08-12 ENCOUNTER — Encounter (HOSPITAL_COMMUNITY): Payer: Self-pay | Admitting: Hematology and Oncology

## 2020-08-12 ENCOUNTER — Ambulatory Visit
Admission: RE | Admit: 2020-08-12 | Discharge: 2020-08-12 | Disposition: A | Discharge status: Home / Self Care | Payer: Medicare HMO | Source: Ambulatory Visit | Attending: Hematology and Oncology | Admitting: Hematology and Oncology

## 2020-08-12 ENCOUNTER — Other Ambulatory Visit: Payer: Self-pay

## 2020-08-12 ENCOUNTER — Other Ambulatory Visit: Payer: Self-pay | Admitting: *Deleted

## 2020-08-12 ENCOUNTER — Telehealth: Payer: Self-pay

## 2020-08-12 DIAGNOSIS — R188 Other ascites: Secondary | ICD-10-CM | POA: Insufficient documentation

## 2020-08-12 DIAGNOSIS — R590 Localized enlarged lymph nodes: Secondary | ICD-10-CM

## 2020-08-12 DIAGNOSIS — J9621 Acute and chronic respiratory failure with hypoxia: Secondary | ICD-10-CM | POA: Diagnosis not present

## 2020-08-12 DIAGNOSIS — J90 Pleural effusion, not elsewhere classified: Secondary | ICD-10-CM | POA: Insufficient documentation

## 2020-08-12 DIAGNOSIS — R0602 Shortness of breath: Secondary | ICD-10-CM

## 2020-08-12 DIAGNOSIS — A419 Sepsis, unspecified organism: Secondary | ICD-10-CM | POA: Diagnosis not present

## 2020-08-12 LAB — GLUCOSE, CAPILLARY: Glucose-Capillary: 109 mg/dL — ABNORMAL HIGH (ref 70–99)

## 2020-08-12 MED ORDER — FLUDEOXYGLUCOSE F - 18 (FDG) INJECTION
9.0000 | Freq: Once | INTRAVENOUS | Status: AC | PRN
  Administered 2020-08-12: 9.19 via INTRAVENOUS

## 2020-08-12 NOTE — Telephone Encounter (Signed)
Patient aware that thoracent[facility]. Advised patient to go have covid test tomorrow at the [facility] between 8-12.

## 2020-08-12 NOTE — Telephone Encounter (Signed)
I called pre procedure and got voicemail and left message that pt will need covid test 3/23 between 8-12 noon and pt already  Knows to come. Pt thoracentesis  Is3/25.left my number if they have questions

## 2020-08-13 ENCOUNTER — Telehealth: Payer: Self-pay | Admitting: *Deleted

## 2020-08-13 ENCOUNTER — Other Ambulatory Visit
Admission: RE | Admit: 2020-08-13 | Discharge: 2020-08-13 | Disposition: A | Discharge status: Home / Self Care | Payer: Medicare HMO | Source: Ambulatory Visit | Attending: Hematology and Oncology | Admitting: Hematology and Oncology

## 2020-08-13 DIAGNOSIS — Z20822 Contact with and (suspected) exposure to covid-19: Secondary | ICD-10-CM | POA: Insufficient documentation

## 2020-08-13 DIAGNOSIS — Z01812 Encounter for preprocedural laboratory examination: Secondary | ICD-10-CM | POA: Insufficient documentation

## 2020-08-13 LAB — SARS CORONAVIRUS 2 (TAT 6-24 HRS): SARS Coronavirus 2: NEGATIVE

## 2020-08-13 NOTE — Telephone Encounter (Signed)
Called report  IMPRESSION: Widespread adenopathy, visceral involvement and signs of near diffuse, multifocal bony involvement, distribution most compatible with lymphoma, Deauville category 5. Correlation with pathology results is suggested if not yet performed.  Ileal involvement as well.  Peritoneal involvement is suggested as well with nodular changes about the peritoneum but without significant FDG uptake. Example seen on image 179 of series 4.  Paraspinal soft tissue without frank bony destruction.  Scattered areas of sclerosis underestimate the degree of bony involvement.  Abdominal ascites may be related to anasarca and or lymphatic congestion with marked enlargement of LEFT-sided pleural effusion and mild enlargement of small RIGHT-sided pleural fluid.  These results will be called to the ordering clinician or representative by the Radiologist Assistant, and communication documented in the PACS or [employer].   Electronically Signed   By: Zetta Bills M.D.   On: 08/12/2020 16:30

## 2020-08-13 NOTE — Progress Notes (Signed)
[facility]  [address], [address] [place], [place] [address] Phone: [phone]  Fax: [phone]   Clinic Day:  08/14/2020  Referring physician: [name], MD  Chief Complaint: Natasha Farrell is a 75 y.o. female with lymphadenopathy who is seen for 2 week assessment and discussion regarding direction of therapy.  HPI:  The patient was last seen in the medical oncology clinic on 07/28/2020 for new patient assessment. At that time, she noted drenching night sweats, early satiety, and a 6 pound weight loss.  Exam revealed extensive adenopathy and splenomegaly.  We discussed excisional biopsy of a lymph node for diagnosis of presumed lymphoma.  Work-up revealed a hematocrit of 36.4, hemoglobin 12.3, platelets 63,000, WBC 8,500. Sodium was 132. Creatinine was 0.81.  Calcium was 10.1 with an albumin of 4.5. Total protein was 9.0 (6.5-8.1). G6PD assay was 7.7 (normal). Hepatitis B surface antigen, hepatitis B core antibody, hepatitis C antibody, and HIV antibody were non reactive. INR was 1.2. LDH was 182. Uric acid was 9.3 (2.5-7.1); she was to begin allopurinol.  Lymph node biopsy on 08/01/2020 by Dr. Bary Castilla remains pending.  Pathology has been sent out for a second opinion.  Bone marrow on 08/04/2020 revealed variably cellular bone marrow with trilineage hematopoiesis and involvement by an atypical lymphoid proliferation  There was paratrabecular lymphoid aggregates and  patchy infiltration of the bone marrow space with accompanying fibrosis. The infiltrate was composed of mixed B-cells and T-cells. At least a subset of the B-cells co-expressed CD10 and BCL6 without definitive BCL2 expression. Scattered larger CD20-positive larger B-cells were also present. Correlation with the patient's lymph node biopsy findings was advised. Flow cytometry showed no definitive atypical B-cell population and no immunophenotypically aberrant T-cell population. There were no  increase in blasts. Monocytes showed heterogeneous CD56 expression.  Cytogenetics revealed a normal female karyotype (42, XX).  PET scan on 08/12/2020 revealed widespread adenopathy, visceral involvement and signs of near diffuse, multifocal bony involvement, distribution most c/w lymphoma, Deauville category 5. There was ileal involvement as well. Peritoneal involvement was suggested as well with nodular changes about the peritoneum but without significant FDG uptake. There was paraspinal soft tissue without frank bony destruction. Scattered areas of sclerosis underestimated the degree of bony involvement. Abdominal ascites may be related to anasarca and or lymphatic congestion with marked enlargement of LEFT-sided pleural effusion and mild enlargement of small RIGHT-sided pleural fluid.  She was seen by Dr Bary Castilla on 08/12/2020.  She noted shortness of breath.  They discussed the benefit from left thoracentesis.  Port placement was discussed.  I spoke with Dr Bary Castilla. Thoracentesis was scheduled for 08/15/2020.  During the interim, she has been "not too swift." She reports weakness, fatigue, shortness of breath, right side discomfort, nausea, dark stools, and a headache this morning. She sleeps sitting up or she starts to cough. She has not been eating well. Boost gives her diarrhea and too much solid food causes abdominal cramps.  She spends most of her time at home. She is more active some days than others. She is able to perform her ADLs independently. This morning, she needed help getting out of the tub because she was too weak; this is the first time this has happened.  She is taking allopurinol.   Past Medical History:  Diagnosis Date  . Hx of hysterectomy   . Hyperlipidemia   . Hypertension   . Thyroid disease    rt thryroidectomy  09/25/2010    Past Surgical History:  Procedure Laterality Date  . BREAST BIOPSY Right 04/21/2020   hydromark 3 [place] bx path pending  . LYMPH NODE BIOPSY Left  08/01/2020   Procedure: LYMPH NODE BIOPSY;  Surgeon: Robert Bellow, MD;  Location: [facility] ORS;  Service: General;  Laterality: Left;    Family History  Problem Relation Age of Onset  . Breast cancer Sister 76  . Colon cancer Sister   . Kidney cancer Sister     Social History:  reports that she has never smoked. She has never used smokeless tobacco. She reports that she does not drink alcohol and does not use drugs. She does not use alcohol or drugs. She denies exposure to radiation or toxins. She is a retired Librarian, academic. Her husband is Josph Macho. The patient is accompanied by Josph Macho today.  Allergies:  Allergies  Allergen Reactions  . Lisinopril Swelling    angioedema    Current Medications: Current Outpatient Medications  Medication Sig Dispense Refill  . allopurinol (ZYLOPRIM) 300 MG tablet Take 1 tablet (300 mg total) by mouth daily. 30 tablet 2  . amLODipine (NORVASC) 10 MG tablet Take 1 tablet by mouth daily.    . carvedilol (COREG) 25 MG tablet Take 1 tablet (25 mg total) by mouth 2 (two) times daily. 180 tablet 3  . cholecalciferol (VITAMIN D3) 25 MCG (1000 UNIT) tablet Take 2,000 Units by mouth daily.    Marland Kitchen loratadine (CLARITIN) 10 MG tablet Take 10 mg by mouth daily as needed for allergies.    . pravastatin (PRAVACHOL) 40 MG tablet Take 40 mg by mouth daily.    Marland Kitchen spironolactone (ALDACTONE) 50 MG tablet Take 50 mg by mouth daily.    Marland Kitchen SYNTHROID 50 MCG tablet Take 1 tablet (50 mcg total) by mouth daily. 30 tablet 3   No current facility-administered medications for this visit.    Review of Systems  Constitutional: Positive for malaise/fatigue. Negative for chills, diaphoresis, fever and weight loss (stable).  HENT: Negative for congestion, ear discharge, ear pain, hearing loss, nosebleeds, sinus pain, sore throat and tinnitus.   Eyes: Negative for blurred vision.  Respiratory: Positive for cough and shortness of breath. Negative for hemoptysis and sputum production.    Cardiovascular: Positive for orthopnea (sleeps sitting up). Negative for chest pain, palpitations and leg swelling.  Gastrointestinal: Positive for abdominal pain (if she eats too much at one time), diarrhea (if she drinks Boost), melena and nausea. Negative for blood in stool, constipation, heartburn and vomiting.       Poor appetite  Genitourinary: Negative for dysuria, frequency, hematuria and urgency.  Musculoskeletal: Negative for back pain, joint pain, myalgias and neck pain.       Right side pain.  Skin: Negative for itching and rash.  Neurological: Positive for weakness (generalized) and headaches (this morning). Negative for dizziness, tingling and sensory change.  Endo/Heme/Allergies: Does not bruise/bleed easily.  Psychiatric/Behavioral: Negative for depression and memory loss. The patient is not nervous/anxious and does not have insomnia.   All other systems reviewed and are negative.  Performance status (ECOG): 2  Vitals Blood pressure (!) 168/81, pulse (!) 118, temperature 98.4 F (36.9 C), temperature source Tympanic, resp. rate 20, weight 175 lb 13.1 oz (79.8 kg), SpO2 96 %.   Physical Exam Vitals and nursing note reviewed.  Constitutional:      General: She is not in acute distress.    Appearance: She is not diaphoretic.  HENT:     Head: Normocephalic and atraumatic.     Mouth/Throat:  Mouth: Mucous membranes are moist.     Pharynx: Oropharynx is clear.  Eyes:     General: No scleral icterus.    Extraocular Movements: Extraocular movements intact.     Conjunctiva/sclera: Conjunctivae normal.     Pupils: Pupils are equal, round, and reactive to light.  Cardiovascular:     Rate and Rhythm: Normal rate and regular rhythm.     Heart sounds: Normal heart sounds. No murmur heard.   Pulmonary:     Effort: Pulmonary effort is normal. No respiratory distress.     Breath sounds: No wheezing or rales.     Comments: Decreased breath sounds left side 1/2 way  up. Chest:     Chest wall: No tenderness.  Breasts:     Right: Axillary adenopathy (multiple right axillary lymph nodes) present. No supraclavicular adenopathy.     Left: Axillary adenopathy (multiple left axillary lymph nodes) present. No supraclavicular adenopathy.    Abdominal:     General: Bowel sounds are normal. There is no distension.     Palpations: Abdomen is soft. There is hepatomegaly and splenomegaly (5 fingerbreadths below left costal margin.). There is no mass.     Tenderness: There is abdominal tenderness. There is no guarding or rebound.     Comments: Fullness upper abdomen.  Musculoskeletal:        General: No swelling or tenderness. Normal range of motion.     Cervical back: Normal range of motion and neck supple.     Right lower leg: Edema present.     Left lower leg: Edema present.     Comments: Negative Homans sign  Lymphadenopathy:     Head:     Right side of head: Submandibular and preauricular adenopathy present. No posterior auricular or occipital adenopathy.     Left side of head: Submandibular and preauricular (small) adenopathy present. No posterior auricular or occipital adenopathy.     Cervical: Cervical adenopathy (right posterior low cervical lymph nodes. Cluster of low cervical lymph nodes on the left) present.     Upper Body:     Right upper body: Axillary adenopathy (multiple right axillary lymph nodes) present. No supraclavicular adenopathy.     Left upper body: Axillary adenopathy (multiple left axillary lymph nodes) present. No supraclavicular adenopathy.     Lower Body: Right inguinal adenopathy present. No left inguinal adenopathy.  Skin:    General: Skin is warm and dry.     Comments: Left axillary incision healing well.  Neurological:     Mental Status: She is alert and oriented to person, place, and time.  Psychiatric:        Behavior: Behavior normal.        Thought Content: Thought content normal.        Judgment: Judgment normal.     Office Visit on 08/14/2020  Component Date Value Ref Range Status  . Uric Acid, Serum 08/14/2020 3.3  2.5 - 7.1 mg/dL Final   Performed at [facility], [address], [place], [place] [address]  . Sodium 08/14/2020 122* 135 - 145 mmol/L Final  . Potassium 08/14/2020 4.1  3.5 - 5.1 mmol/L Final  . Chloride 08/14/2020 89* 98 - 111 mmol/L Final  . CO2 08/14/2020 22  22 - 32 mmol/L Final  . Glucose, Bld 08/14/2020 114* 70 - 99 mg/dL Final   Glucose reference range applies only to samples taken after fasting for at least 8 hours.  . BUN 08/14/2020 11  8 - 23 mg/dL  Final  . Creatinine, Ser 08/14/2020 0.68  0.44 - 1.00 mg/dL Final  . Calcium 08/14/2020 9.6  8.9 - 10.3 mg/dL Final  . GFR, Estimated 08/14/2020 >60  >60 mL/min Final   Comment: (NOTE) Calculated using the CKD-EPI Creatinine Equation (2021)   . Anion gap 08/14/2020 11  5 - 15 Final   Performed at [facility], [address], [place], [place] [address]  . WBC 08/14/2020 14.9* 4.0 - 10.5 K/uL Final  . RBC 08/14/2020 3.58* 3.87 - 5.11 MIL/uL Final  . Hemoglobin 08/14/2020 10.5* 12.0 - 15.0 g/dL Final  . HCT 08/14/2020 30.3* 36.0 - 46.0 % Final  . MCV 08/14/2020 84.6  80.0 - 100.0 fL Final  . MCH 08/14/2020 29.3  26.0 - 34.0 pg Final  . MCHC 08/14/2020 34.7  30.0 - 36.0 g/dL Final  . RDW 08/14/2020 17.0* 11.5 - 15.5 % Final  . Platelets 08/14/2020 41* 150 - 400 K/uL Final   Comment: Immature Platelet Fraction may be clinically indicated, consider ordering this additional test [id number]   . nRBC 08/14/2020 0.1  0.0 - 0.2 % Final  . Neutrophils Relative % 08/14/2020 72  % Final  . Neutro Abs 08/14/2020 10.6* 1.7 - 7.7 K/uL Final  . Lymphocytes Relative 08/14/2020 12  % Final  . Lymphs Abs 08/14/2020 1.7  0.7 - 4.0 K/uL Final  . Monocytes Relative 08/14/2020 12  % Final  . Monocytes Absolute 08/14/2020 1.8* 0.1 - 1.0 K/uL Final  . Eosinophils Relative 08/14/2020 1  % Final  . Eosinophils  Absolute 08/14/2020 0.2  0.0 - 0.5 K/uL Final  . Basophils Relative 08/14/2020 0  % Final  . Basophils Absolute 08/14/2020 0.0  0.0 - 0.1 K/uL Final  . Immature Granulocytes 08/14/2020 3  % Final  . Abs Immature Granulocytes 08/14/2020 0.50* 0.00 - 0.07 K/uL Final   Performed at [facility], [address], [place], [place] [address]  Hospital Outpatient Visit on 08/13/2020  Component Date Value Ref Range Status  . SARS Coronavirus 2 08/13/2020 NEGATIVE  NEGATIVE Final   Comment: (NOTE) SARS-CoV-2 target nucleic acids are NOT DETECTED.  The SARS-CoV-2 RNA is generally detectable in upper and lower respiratory specimens during the acute phase of infection. Negative results do not preclude SARS-CoV-2 infection, do not rule out co-infections with other pathogens, and should not be used as the sole basis for treatment or other patient management decisions. Negative results must be combined with clinical observations, patient history, and epidemiological information. The expected result is Negative.  Fact Sheet for Patients: SugarRoll.be  Fact Sheet for Healthcare Providers: https://www.woods-mathews.com/  This test is not yet approved or cleared by the [place] FDA and  has been authorized for detection and/or diagnosis of SARS-CoV-2 by FDA under an Emergency Use Authorization (EUA). This EUA will remain  in effect (meaning this test can be used) for the duration of the COVID-19 declaration under Se                          ction 564(b)(1) of the Act, 21 U.S.C. section 360bbb-3(b)(1), unless the authorization is terminated or revoked sooner.  Performed at [facility], [place] [address], [place],  [address]   Hospital Outpatient Visit on 08/12/2020  Component Date Value Ref Range Status  . Glucose-Capillary 08/12/2020 109* 70 - 99 mg/dL Final   Glucose reference range applies only to samples taken after fasting  for at least  8 hours.    Assessment:  Natasha Farrell is a 75 y.o. female with extensive adenopathy and massive splenomegaly c/w lymphoma.  She is s/p excisional lymph node biopsy on 08/01/2020 is pending.  Right axillary ultrasound guided biopsy on 04/21/2020 revealed fragments of lymph node with reactive follicular hyperplasia. There was no definite evidence of malignancy. Flow cytometry revealed a CD10+ B cell population. Clonality could not be evaluated due to non-specific light chain binding. There was no evidence of a T cell lymphoproliferative disorder.  Chest CT angiogram on 07/21/2020 revealed extensive lymphadenopathy throughout the neck, chest, and visualized portions of the abdomen, along with marked splenomegaly.  Largest right axillary lymph node was 2.1 x 1.6 cm and left axillary lymph node 3.0 x 1.5 cm.  There were enlarged lymph nodes interposed between the brachiocephalic vein and left subclavian measuring 2.1 x 1.6 cm.  Overall, findings favored leukemia or lymphoma. There was no evidence of pulmonary embolus. There was a small left pleural effusion.   Work-up on 07/28/2020 revealed a hematocrit of 36.4, hemoglobin 12.3, platelets 63,000, WBC 8,500 ([place] 5200).  Calcium was 10.1 with an albumin of 4.5. Total protein was 9.0 (6.5-8.1). G6PD assay was 7.7 (normal). Normal studies included: hepatitis B surface antigen, hepatitis B core antibody, hepatitis C antibody, and HIV antibody.  LDH was 182. Uric acid was 9.3 (2.5-7.1); she began allopurinol.  Bone marrow on 08/04/2020 revealed variably cellular bone marrow with trilineage hematopoiesis and involvement by an atypical lymphoid proliferation  The bone marrow core biopsy demonstrated involvement by an atypical lymphoid proliferation with paratrabecular lymphoid aggregates and  patchy infiltration of the bone marrow space with accompanying fibrosis. The infiltrate was composed of mixed B-cells and T-cells. At least a subset of the B-cells  co-expressed CD10 and BCL6 without definitive BCL2 expression. Scattered larger CD20-positive larger B-cells were also present. Flow cytometry showed no definitive atypical B-cell population. No immunophenotypically aberrant T-cell population was identified. There were no increase in blasts. Monocytes showed heterogeneous CD56 expression.  Cytogenetics were normal (83, XX).  PET scan on 08/12/2020 revealed widespread adenopathy, visceral involvement and signs of near diffuse, multifocal bony involvement, distribution most c/w lymphoma, Deauville category 5. There was ileal involvement as well. Peritoneal involvement was suggested as well with nodular changes about the peritoneum but without significant FDG uptake. There was paraspinal soft tissue without frank bony destruction. Scattered areas of sclerosis underestimated the degree of bony involvement. Abdominal ascites may be related to anasarca and or lymphatic congestion with marked enlargement of LEFT-sided pleural effusion and mild enlargement of small RIGHT-sided pleural fluid.  Thoracentesis is scheduled for 08/15/2020.  Echo on 07/24/2020 revealed an EF of 60-65%.  She has a family history of malignancy.  Her father died from prostate cancer. Her sister had breast cancer. Another sister had colon cancer and kidney cancer.  The patient received the COVID-19 vaccine in 06/2019 and 07/2019. The Moderna Booster was on 03/17/2020.   Symptomatically, she is fatigued.  She has progressive adenopathy and an enlarging left pleural effusion.  Plan: 1.   Labs today: CBC with diff, BMP, uric acid.  2.   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  Initial biopsy on 04/21/2020 performed when lymph nodes small suggested a B cell population.  Lymph node biopsy on 08/01/2020 remains pending.     Pathology was sent out for a second opinion.   I have followed up with Dr Dessie Coma in pathology  frequently.  Treatment plan will be based on final pathology.  Bone marrow on 08/04/2020 revealed involvement by an atypical lymphoid proliferation.   Flow cytometry is negative.  PET scan on 08/12/2020 was personally reviewed with the patient and her husband.   She has extremely aggressive disease.   Imaging reveals extensive bulky adenopathy, visceral involvement, and bone involvement.   She has an enlarging symptomatic left sided pleural effusion.  Discuss concern for patient's symptoms.   She declines evalaution in th ER and wishes to wait until her scheduled thoracentesis in the AM.  Review tumor lysis syndrome and importance of hydration and continued allopurinol.  Discuss chemotherapy class and treatment ASAP.  Multiple questions asked and answered. 3.   Thrombocytopenia  Platelet count is 41,000.  Etiology felt secondary to massive hepatosplenomegaly.  Marrow does not suggest significant involvement (? sampling). 4.   Left pleural effusion  Effusion has increased in size over the past 2 weeks.  Diagnostic and therapeutic thoracentesis in AM.  Patient counseled regarding potential evaluation in the ER for immediate management if needed. 5.   Chemotherapy class. 6.   RTC on 08/19/2020 for MD assessment, labs (CBC with diff, CMP, LDH, uric acid), and cycle #1 chemotherapy (TBD).  I discussed the assessment and treatment plan with the patient.  The patient was provided an opportunity to ask questions and all were answered.  The patient agreed with the plan and demonstrated an understanding of the instructions.  The patient was advised to call back if the symptoms worsen or if the condition fails to improve as anticipated.  I provided 35 minutes of face-to-face time during this this encounter and > 50% was spent counseling as documented under my assessment and plan.  An additional 15+ minutes were spent reviewing her chart ([place]) including notes, labs, and imaging  studies.    Roneisha Stern C. Mike Gip, MD, PhD    08/14/2020, 4:17 PM  I, Mirian Mo Tufford, am acting as Education administrator for [employer]. Mike Gip, MD, PhD.  I, Avaiah Stempel C. Mike Gip, MD, have reviewed the above documentation for accuracy and completeness, and I agree with the above.

## 2020-08-14 ENCOUNTER — Encounter: Payer: Self-pay | Admitting: Hematology and Oncology

## 2020-08-14 ENCOUNTER — Inpatient Hospital Stay: Payer: Medicare HMO | Admitting: Hematology and Oncology

## 2020-08-14 ENCOUNTER — Telehealth: Payer: Self-pay | Admitting: *Deleted

## 2020-08-14 ENCOUNTER — Other Ambulatory Visit: Payer: Self-pay

## 2020-08-14 ENCOUNTER — Inpatient Hospital Stay: Payer: Medicare HMO

## 2020-08-14 ENCOUNTER — Encounter (HOSPITAL_COMMUNITY): Payer: Self-pay | Admitting: Hematology and Oncology

## 2020-08-14 VITALS — BP 168/81 | HR 118 | Temp 98.4°F | Resp 20 | Wt 175.8 lb

## 2020-08-14 DIAGNOSIS — R161 Splenomegaly, not elsewhere classified: Secondary | ICD-10-CM

## 2020-08-14 DIAGNOSIS — D696 Thrombocytopenia, unspecified: Secondary | ICD-10-CM | POA: Diagnosis not present

## 2020-08-14 DIAGNOSIS — Z7189 Other specified counseling: Secondary | ICD-10-CM | POA: Insufficient documentation

## 2020-08-14 DIAGNOSIS — J90 Pleural effusion, not elsewhere classified: Secondary | ICD-10-CM | POA: Diagnosis not present

## 2020-08-14 DIAGNOSIS — R599 Enlarged lymph nodes, unspecified: Secondary | ICD-10-CM

## 2020-08-14 LAB — CBC WITH DIFFERENTIAL/PLATELET
Abs Immature Granulocytes: 0.5 10*3/uL — ABNORMAL HIGH (ref 0.00–0.07)
Basophils Absolute: 0 10*3/uL (ref 0.0–0.1)
Basophils Relative: 0 %
Eosinophils Absolute: 0.2 10*3/uL (ref 0.0–0.5)
Eosinophils Relative: 1 %
HCT: 30.3 % — ABNORMAL LOW (ref 36.0–46.0)
Hemoglobin: 10.5 g/dL — ABNORMAL LOW (ref 12.0–15.0)
Immature Granulocytes: 3 %
Lymphocytes Relative: 12 %
Lymphs Abs: 1.7 10*3/uL (ref 0.7–4.0)
MCH: 29.3 pg (ref 26.0–34.0)
MCHC: 34.7 g/dL (ref 30.0–36.0)
MCV: 84.6 fL (ref 80.0–100.0)
Monocytes Absolute: 1.8 10*3/uL — ABNORMAL HIGH (ref 0.1–1.0)
Monocytes Relative: 12 %
Neutro Abs: 10.6 10*3/uL — ABNORMAL HIGH (ref 1.7–7.7)
Neutrophils Relative %: 72 %
Platelets: 41 10*3/uL — ABNORMAL LOW (ref 150–400)
RBC: 3.58 MIL/uL — ABNORMAL LOW (ref 3.87–5.11)
RDW: 17 % — ABNORMAL HIGH (ref 11.5–15.5)
WBC: 14.9 10*3/uL — ABNORMAL HIGH (ref 4.0–10.5)
nRBC: 0.1 % (ref 0.0–0.2)

## 2020-08-14 LAB — BASIC METABOLIC PANEL
Anion gap: 11 (ref 5–15)
BUN: 11 mg/dL (ref 8–23)
CO2: 22 mmol/L (ref 22–32)
Calcium: 9.6 mg/dL (ref 8.9–10.3)
Chloride: 89 mmol/L — ABNORMAL LOW (ref 98–111)
Creatinine, Ser: 0.68 mg/dL (ref 0.44–1.00)
GFR, Estimated: >60 mL/min (ref >60)
Glucose, Bld: 114 mg/dL — ABNORMAL HIGH (ref 70–99)
Potassium: 4.1 mmol/L (ref 3.5–5.1)
Sodium: 122 mmol/L — ABNORMAL LOW (ref 135–145)

## 2020-08-14 LAB — URIC ACID: Uric Acid, Serum: 3.3 mg/dL (ref 2.5–7.1)

## 2020-08-14 NOTE — Telephone Encounter (Signed)
Called pt home and no answer and called husband cell and he is in the house with pt. And put me on speaker and I told her that uric acid is good level now and she will need to cont. Allopurinol. She understands and will continue med

## 2020-08-14 NOTE — Progress Notes (Signed)
Patient here for oncology follow-up appointment, expresses concerns of diarrhea, shortness of breath, headaches

## 2020-08-14 NOTE — Telephone Encounter (Signed)
-----   Message from Lequita Asal, MD sent at 08/14/2020  4:39 PM EDT ----- Regarding: Please call patient  Uric acid looks good.  Continue allopurinol.  M  ----- Message ----- From: Buel Ream, Lab [facility] [place] Sent: 08/14/2020   3:44 PM EDT To: Lequita Asal, MD

## 2020-08-15 ENCOUNTER — Other Ambulatory Visit: Payer: Self-pay | Admitting: Radiology

## 2020-08-15 ENCOUNTER — Inpatient Hospital Stay
Admission: EM | Admit: 2020-08-15 | Discharge: 2020-08-20 | DRG: 871 | Disposition: A | Discharge status: Home / Self Care | Payer: Medicare HMO | Attending: Hospitalist | Admitting: Hospitalist

## 2020-08-15 ENCOUNTER — Emergency Department: Payer: Medicare HMO

## 2020-08-15 ENCOUNTER — Ambulatory Visit
Admission: RE | Admit: 2020-08-15 | Discharge: 2020-08-15 | Disposition: A | Discharge status: Home / Self Care | Payer: Medicare HMO | Source: Ambulatory Visit | Attending: Hematology and Oncology | Admitting: Hematology and Oncology

## 2020-08-15 ENCOUNTER — Other Ambulatory Visit: Payer: Self-pay

## 2020-08-15 ENCOUNTER — Ambulatory Visit
Admission: RE | Admit: 2020-08-15 | Discharge: 2020-08-15 | Disposition: A | Discharge status: Home / Self Care | Payer: Medicare HMO | Source: Ambulatory Visit | Attending: Radiology | Admitting: Radiology

## 2020-08-15 DIAGNOSIS — R161 Splenomegaly, not elsewhere classified: Secondary | ICD-10-CM | POA: Diagnosis present

## 2020-08-15 DIAGNOSIS — R0602 Shortness of breath: Secondary | ICD-10-CM

## 2020-08-15 DIAGNOSIS — J9601 Acute respiratory failure with hypoxia: Secondary | ICD-10-CM | POA: Diagnosis present

## 2020-08-15 DIAGNOSIS — J189 Pneumonia, unspecified organism: Secondary | ICD-10-CM | POA: Diagnosis present

## 2020-08-15 DIAGNOSIS — C8590 Non-Hodgkin lymphoma, unspecified, unspecified site: Secondary | ICD-10-CM | POA: Diagnosis present

## 2020-08-15 DIAGNOSIS — Z79890 Hormone replacement therapy (postmenopausal): Secondary | ICD-10-CM

## 2020-08-15 DIAGNOSIS — D696 Thrombocytopenia, unspecified: Secondary | ICD-10-CM

## 2020-08-15 DIAGNOSIS — A419 Sepsis, unspecified organism: Secondary | ICD-10-CM | POA: Diagnosis present

## 2020-08-15 DIAGNOSIS — I5032 Chronic diastolic (congestive) heart failure: Secondary | ICD-10-CM | POA: Diagnosis present

## 2020-08-15 DIAGNOSIS — R5383 Other fatigue: Secondary | ICD-10-CM | POA: Diagnosis not present

## 2020-08-15 DIAGNOSIS — J90 Pleural effusion, not elsewhere classified: Secondary | ICD-10-CM

## 2020-08-15 DIAGNOSIS — C8330 Diffuse large B-cell lymphoma, unspecified site: Secondary | ICD-10-CM | POA: Diagnosis not present

## 2020-08-15 DIAGNOSIS — I110 Hypertensive heart disease with heart failure: Secondary | ICD-10-CM | POA: Diagnosis present

## 2020-08-15 DIAGNOSIS — Z79899 Other long term (current) drug therapy: Secondary | ICD-10-CM

## 2020-08-15 DIAGNOSIS — J810 Acute pulmonary edema: Secondary | ICD-10-CM

## 2020-08-15 DIAGNOSIS — M109 Gout, unspecified: Secondary | ICD-10-CM | POA: Diagnosis present

## 2020-08-15 DIAGNOSIS — R198 Other specified symptoms and signs involving the digestive system and abdomen: Secondary | ICD-10-CM | POA: Diagnosis not present

## 2020-08-15 DIAGNOSIS — J9621 Acute and chronic respiratory failure with hypoxia: Secondary | ICD-10-CM | POA: Insufficient documentation

## 2020-08-15 DIAGNOSIS — I10 Essential (primary) hypertension: Secondary | ICD-10-CM

## 2020-08-15 DIAGNOSIS — Z9889 Other specified postprocedural states: Secondary | ICD-10-CM | POA: Insufficient documentation

## 2020-08-15 DIAGNOSIS — Z20822 Contact with and (suspected) exposure to covid-19: Secondary | ICD-10-CM | POA: Diagnosis present

## 2020-08-15 DIAGNOSIS — Z90710 Acquired absence of both cervix and uterus: Secondary | ICD-10-CM

## 2020-08-15 DIAGNOSIS — D649 Anemia, unspecified: Secondary | ICD-10-CM | POA: Diagnosis not present

## 2020-08-15 DIAGNOSIS — E890 Postprocedural hypothyroidism: Secondary | ICD-10-CM | POA: Diagnosis present

## 2020-08-15 DIAGNOSIS — R0600 Dyspnea, unspecified: Secondary | ICD-10-CM

## 2020-08-15 DIAGNOSIS — D6959 Other secondary thrombocytopenia: Secondary | ICD-10-CM | POA: Diagnosis present

## 2020-08-15 DIAGNOSIS — E222 Syndrome of inappropriate secretion of antidiuretic hormone: Secondary | ICD-10-CM | POA: Diagnosis present

## 2020-08-15 DIAGNOSIS — K921 Melena: Secondary | ICD-10-CM | POA: Diagnosis not present

## 2020-08-15 DIAGNOSIS — E785 Hyperlipidemia, unspecified: Secondary | ICD-10-CM | POA: Diagnosis present

## 2020-08-15 DIAGNOSIS — R6520 Severe sepsis without septic shock: Secondary | ICD-10-CM | POA: Diagnosis present

## 2020-08-15 DIAGNOSIS — E871 Hypo-osmolality and hyponatremia: Secondary | ICD-10-CM | POA: Diagnosis not present

## 2020-08-15 DIAGNOSIS — E079 Disorder of thyroid, unspecified: Secondary | ICD-10-CM | POA: Diagnosis not present

## 2020-08-15 DIAGNOSIS — Z888 Allergy status to other drugs, medicaments and biological substances status: Secondary | ICD-10-CM | POA: Diagnosis not present

## 2020-08-15 DIAGNOSIS — D631 Anemia in chronic kidney disease: Secondary | ICD-10-CM | POA: Diagnosis present

## 2020-08-15 DIAGNOSIS — R599 Enlarged lymph nodes, unspecified: Secondary | ICD-10-CM | POA: Diagnosis not present

## 2020-08-15 DIAGNOSIS — R0902 Hypoxemia: Secondary | ICD-10-CM

## 2020-08-15 LAB — BASIC METABOLIC PANEL
Anion gap: 15 (ref 5–15)
Anion gap: 12 (ref 5–15)
BUN: 19 mg/dL (ref 8–23)
BUN: 23 mg/dL (ref 8–23)
CO2: 20 mmol/L — ABNORMAL LOW (ref 22–32)
CO2: 22 mmol/L (ref 22–32)
Calcium: 9 mg/dL (ref 8.9–10.3)
Calcium: 8.4 mg/dL — ABNORMAL LOW (ref 8.9–10.3)
Chloride: 87 mmol/L — ABNORMAL LOW (ref 98–111)
Chloride: 88 mmol/L — ABNORMAL LOW (ref 98–111)
Creatinine, Ser: 0.82 mg/dL (ref 0.44–1.00)
Creatinine, Ser: 0.9 mg/dL (ref 0.44–1.00)
GFR, Estimated: >60 mL/min (ref >60)
GFR, Estimated: >60 mL/min (ref >60)
Glucose, Bld: 131 mg/dL — ABNORMAL HIGH (ref 70–99)
Glucose, Bld: 119 mg/dL — ABNORMAL HIGH (ref 70–99)
Potassium: 4.7 mmol/L (ref 3.5–5.1)
Potassium: 4.2 mmol/L (ref 3.5–5.1)
Sodium: 122 mmol/L — ABNORMAL LOW (ref 135–145)
Sodium: 122 mmol/L — ABNORMAL LOW (ref 135–145)

## 2020-08-15 LAB — COMPREHENSIVE METABOLIC PANEL
ALT: 14 U/L (ref 0–44)
AST: 31 U/L (ref 15–41)
Albumin: 3.7 g/dL (ref 3.5–5.0)
Alkaline Phosphatase: 92 U/L (ref 38–126)
Anion gap: 14 (ref 5–15)
BUN: 18 mg/dL (ref 8–23)
CO2: 20 mmol/L — ABNORMAL LOW (ref 22–32)
Calcium: 9.2 mg/dL (ref 8.9–10.3)
Chloride: 88 mmol/L — ABNORMAL LOW (ref 98–111)
Creatinine, Ser: 0.84 mg/dL (ref 0.44–1.00)
GFR, Estimated: >60 mL/min (ref >60)
Glucose, Bld: 149 mg/dL — ABNORMAL HIGH (ref 70–99)
Potassium: 5.1 mmol/L (ref 3.5–5.1)
Sodium: 122 mmol/L — ABNORMAL LOW (ref 135–145)
Total Bilirubin: 1.7 mg/dL — ABNORMAL HIGH (ref 0.3–1.2)
Total Protein: 7.7 g/dL (ref 6.5–8.1)

## 2020-08-15 LAB — PROCALCITONIN: Procalcitonin: 0.81 ng/mL

## 2020-08-15 LAB — CBC WITH DIFFERENTIAL/PLATELET
Abs Immature Granulocytes: 0.85 10*3/uL — ABNORMAL HIGH (ref 0.00–0.07)
Basophils Absolute: 0.1 10*3/uL (ref 0.0–0.1)
Basophils Relative: 1 %
Eosinophils Absolute: 0.2 10*3/uL (ref 0.0–0.5)
Eosinophils Relative: 1 %
HCT: 39 % (ref 36.0–46.0)
Hemoglobin: 13.1 g/dL (ref 12.0–15.0)
Immature Granulocytes: 5 %
Lymphocytes Relative: 14 %
Lymphs Abs: 2.4 10*3/uL (ref 0.7–4.0)
MCH: 29.8 pg (ref 26.0–34.0)
MCHC: 33.6 g/dL (ref 30.0–36.0)
MCV: 88.6 fL (ref 80.0–100.0)
Monocytes Absolute: 1.8 10*3/uL — ABNORMAL HIGH (ref 0.1–1.0)
Monocytes Relative: 10 %
Neutro Abs: 11.7 10*3/uL — ABNORMAL HIGH (ref 1.7–7.7)
Neutrophils Relative %: 69 %
Platelets: 44 10*3/uL — ABNORMAL LOW (ref 150–400)
RBC: 4.4 MIL/uL (ref 3.87–5.11)
RDW: 17.4 % — ABNORMAL HIGH (ref 11.5–15.5)
Smear Review: NORMAL
WBC: 16.9 10*3/uL — ABNORMAL HIGH (ref 4.0–10.5)
nRBC: 0.2 % (ref 0.0–0.2)

## 2020-08-15 LAB — BRAIN NATRIURETIC PEPTIDE: B Natriuretic Peptide: 22.8 pg/mL (ref 0.0–100.0)

## 2020-08-15 LAB — LACTATE DEHYDROGENASE, PLEURAL OR PERITONEAL FLUID: LD, Fluid: 128 U/L — ABNORMAL HIGH (ref 3–23)

## 2020-08-15 LAB — TROPONIN I (HIGH SENSITIVITY): Troponin I (High Sensitivity): 7 ng/L (ref <18)

## 2020-08-15 LAB — RESP PANEL BY RT-PCR (FLU A&B, COVID) ARPGX2
Influenza A by PCR: NEGATIVE
Influenza B by PCR: NEGATIVE
SARS Coronavirus 2 by RT PCR: NEGATIVE

## 2020-08-15 LAB — GLUCOSE, PLEURAL OR PERITONEAL FLUID: Glucose, Fluid: 98 mg/dL

## 2020-08-15 LAB — PROTEIN, PLEURAL OR PERITONEAL FLUID: Total protein, fluid: 5.4 g/dL

## 2020-08-15 LAB — SODIUM, URINE, RANDOM: Sodium, Ur: 88 mmol/L

## 2020-08-15 LAB — LACTIC ACID, PLASMA
Lactic Acid, Venous: 2.8 mmol/L (ref 0.5–1.9)
Lactic Acid, Venous: 2.6 mmol/L (ref 0.5–1.9)

## 2020-08-15 LAB — OSMOLALITY, URINE: Osmolality, Ur: 314 mOsm/kg (ref 300–900)

## 2020-08-15 LAB — OSMOLALITY: Osmolality: 264 mOsm/kg — ABNORMAL LOW (ref 275–295)

## 2020-08-15 MED ORDER — HYDRALAZINE HCL 20 MG/ML IJ SOLN: 5.0000 mg | INTRAMUSCULAR | Status: DC | PRN

## 2020-08-15 MED ORDER — LORATADINE 10 MG PO TABS: 10.0000 mg | ORAL_TABLET | Freq: Every day | ORAL | Status: DC | PRN

## 2020-08-15 MED ORDER — ACETAMINOPHEN 325 MG PO TABS
650.0000 mg | ORAL_TABLET | Freq: Four times a day (QID) | ORAL | Status: DC | PRN
  Administered 2020-08-16: 650 mg via ORAL
  Filled 2020-08-15: qty 2

## 2020-08-15 MED ORDER — DM-GUAIFENESIN ER 30-600 MG PO TB12
1.0000 | ORAL_TABLET | Freq: Two times a day (BID) | ORAL | Status: DC | PRN
  Administered 2020-08-15: 1 via ORAL
  Filled 2020-08-15: qty 1

## 2020-08-15 MED ORDER — ONDANSETRON HCL 4 MG/2ML IJ SOLN: 4.0000 mg | Freq: Three times a day (TID) | INTRAMUSCULAR | Status: DC | PRN

## 2020-08-15 MED ORDER — SODIUM CHLORIDE 0.9 % IV SOLN
500.0000 mg | INTRAVENOUS | Status: DC
  Administered 2020-08-15 – 2020-08-18 (×4): 500 mg via INTRAVENOUS
  Filled 2020-08-15 (×5): qty 500

## 2020-08-15 MED ORDER — CEFTRIAXONE SODIUM 1 G IJ SOLR
1.0000 g | INTRAMUSCULAR | Status: DC
  Administered 2020-08-15 – 2020-08-18 (×4): 1 g via INTRAVENOUS
  Filled 2020-08-15: qty 1
  Filled 2020-08-15 (×2): qty 10
  Filled 2020-08-15 (×2): qty 1

## 2020-08-15 MED ORDER — FUROSEMIDE 10 MG/ML IJ SOLN
40.0000 mg | Freq: Once | INTRAMUSCULAR | Status: AC
  Administered 2020-08-15: 40 mg via INTRAVENOUS
  Filled 2020-08-15: qty 4

## 2020-08-15 MED ORDER — VITAMIN D 25 MCG (1000 UNIT) PO TABS
2000.0000 [IU] | ORAL_TABLET | Freq: Every day | ORAL | Status: DC
  Administered 2020-08-16 – 2020-08-20 (×5): 2000 [IU] via ORAL
  Filled 2020-08-15 (×5): qty 2

## 2020-08-15 MED ORDER — LEVOTHYROXINE SODIUM 50 MCG PO TABS
50.0000 ug | ORAL_TABLET | Freq: Every day | ORAL | Status: DC
  Administered 2020-08-16 – 2020-08-20 (×5): 50 ug via ORAL
  Filled 2020-08-15 (×5): qty 1

## 2020-08-15 MED ORDER — ALBUTEROL SULFATE HFA 108 (90 BASE) MCG/ACT IN AERS
2.0000 | INHALATION_SPRAY | RESPIRATORY_TRACT | Status: DC | PRN
  Filled 2020-08-15: qty 6.7

## 2020-08-15 MED ORDER — IPRATROPIUM BROMIDE HFA 17 MCG/ACT IN AERS
2.0000 | INHALATION_SPRAY | RESPIRATORY_TRACT | Status: DC
  Administered 2020-08-15 – 2020-08-16 (×4): 2 via RESPIRATORY_TRACT
  Filled 2020-08-15: qty 12.9

## 2020-08-15 MED ORDER — ALLOPURINOL 300 MG PO TABS
300.0000 mg | ORAL_TABLET | Freq: Every day | ORAL | Status: DC
  Administered 2020-08-16 – 2020-08-20 (×5): 300 mg via ORAL
  Filled 2020-08-15 (×5): qty 1

## 2020-08-15 MED ORDER — PRAVASTATIN SODIUM 40 MG PO TABS
40.0000 mg | ORAL_TABLET | Freq: Every day | ORAL | Status: DC
  Administered 2020-08-16 – 2020-08-20 (×5): 40 mg via ORAL
  Filled 2020-08-15 (×5): qty 1

## 2020-08-15 MED ORDER — CARVEDILOL 25 MG PO TABS
25.0000 mg | ORAL_TABLET | Freq: Two times a day (BID) | ORAL | Status: DC
  Administered 2020-08-15 – 2020-08-20 (×10): 25 mg via ORAL
  Filled 2020-08-15 (×10): qty 1

## 2020-08-15 NOTE — Procedures (Signed)
PROCEDURE SUMMARY:  Successful US guided left thoracentesis. Yielded 1.6 L of bloody pleural fluid. Pt tolerated procedure well. No immediate complications.  Specimen was sent for labs. CXR ordered.  EBL < 5 mL  Ascencion Dike PA-C 08/15/2020 11:22 AM

## 2020-08-15 NOTE — ED Triage Notes (Addendum)
See first nurse note- pt here after having thoracentesis this morning. Pt reports she was told she would have a cough after discharge but has been coughing for over two hours and throwing up. Also reports increased shortness of breath. Pt pale and diaphoretic in triage room.

## 2020-08-15 NOTE — Patient Instructions (Incomplete)
Rituximab Injection What is this medicine? RITUXIMAB (ri TUX i mab) is a monoclonal antibody. It is used to treat certain types of cancer like non-Hodgkin lymphoma and chronic lymphocytic leukemia. It is also used to treat rheumatoid arthritis, granulomatosis with polyangiitis, microscopic polyangiitis, and pemphigus vulgaris. This medicine may be used for other purposes; ask your health care provider or pharmacist if you have questions. COMMON BRAND NAME(S): RIABNI, Rituxan, RUXIENCE What should I tell my health care provider before I take this medicine? They need to know if you have any of these conditions:  chest pain  heart disease  infection especially a viral infection such as chickenpox, cold sores, hepatitis B, or herpes  immune system problems  irregular heartbeat or rhythm  kidney disease  low blood counts (white cells, platelets, or red cells)  lung disease  recent or upcoming vaccine  an unusual or allergic reaction to rituximab, other medicines, foods, dyes, or preservatives  pregnant or trying to get pregnant  breast-feeding How should I use this medicine? This medicine is injected into a vein. It is given by a health care provider in a hospital or clinic setting. A special MedGuide will be given to you before each treatment. Be sure to read this information carefully each time. Talk to your health care provider about the use of this medicine in children. While this drug may be prescribed for children as young as 2 years for selected conditions, precautions do apply. Overdosage: If you think you have taken too much of this medicine contact a poison control center or emergency room at once. NOTE: This medicine is only for you. Do not share this medicine with others. What if I miss a dose? Keep appointments for follow-up doses. It is important not to miss your dose. Call your health care provider if you are unable to keep an appointment. What may interact with this  medicine? Do not take this medicine with any of the following medicines:  live vaccines This medicine may also interact with the following medicines:  cisplatin This list may not describe all possible interactions. Give your health care provider a list of all the medicines, herbs, non-prescription drugs, or dietary supplements you use. Also tell them if you smoke, drink alcohol, or use illegal drugs. Some items may interact with your medicine. What should I watch for while using this medicine? Your condition will be monitored carefully while you are receiving this medicine. You may need blood work done while you are taking this medicine. This medicine can cause serious infusion reactions. To reduce the risk your health care provider may give you other medicines to take before receiving this one. Be sure to follow the directions from your health care provider. This medicine may increase your risk of getting an infection. Call your health care provider for advice if you get a fever, chills, sore throat, or other symptoms of a cold or flu. Do not treat yourself. Try to avoid being around people who are sick. Call your health care provider if you are around anyone with measles, chickenpox, or if you develop sores or blisters that do not heal properly. Avoid taking medicines that contain aspirin, acetaminophen, ibuprofen, naproxen, or ketoprofen unless instructed by your health care provider. These medicines may hide a fever. This medicine may cause serious skin reactions. They can happen weeks to months after starting the medicine. Contact your health care provider right away if you notice fevers or flu-like symptoms with a rash. The rash may be red   or purple and then turn into blisters or peeling of the skin. Or, you might notice a red rash with swelling of the face, lips or lymph nodes in your neck or under your arms. In some patients, this medicine may cause a serious brain infection that may cause  death. If you have any problems seeing, thinking, speaking, walking, or standing, tell your healthcare professional right away. If you cannot reach your healthcare professional, urgently seek other source of medical care. Do not become pregnant while taking this medicine or for at least 12 months after stopping it. Women should inform their health care provider if they wish to become pregnant or think they might be pregnant. There is potential for serious harm to an unborn child. Talk to your health care provider for more information. Women should use a reliable form of birth control while taking this medicine and for 12 months after stopping it. Do not breast-feed while taking this medicine or for at least 6 months after stopping it. What side effects may I notice from receiving this medicine? Side effects that you should report to your health care provider as soon as possible:  allergic reactions (skin rash, itching or hives; swelling of the face, lips, or tongue)  diarrhea  edema (sudden weight gain; swelling of the ankles, feet, hands or other unusual swelling; trouble breathing)  fast, irregular heartbeat  heart attack (trouble breathing; pain or tightness in the chest, neck, back or arms; unusually weak or tired)  infection (fever, chills, cough, sore throat, pain or trouble passing urine)  kidney injury (trouble passing urine or change in the amount of urine)  liver injury (dark yellow or brown urine; general ill feeling or flu-like symptoms; loss of appetite, right upper belly pain; unusually weak or tired, yellowing of the eyes or skin)  low blood pressure (dizziness; feeling faint or lightheaded, falls; unusually weak or tired)  low red blood cell counts (trouble breathing; feeling faint; lightheaded, falls; unusually weak or tired)  mouth sores  redness, blistering, peeling, or loosening of the skin, including inside the mouth  stomach pain  unusual bruising or  bleeding  wheezing (trouble breathing with loud or whistling sounds)  vomiting Side effects that usually do not require medical attention (report to your health care provider if they continue or are bothersome):  headache  joint pain  muscle cramps, pain  nausea This list may not describe all possible side effects. Call your doctor for medical advice about side effects. You may report side effects to FDA at 1-800-FDA-1088. Where should I keep my medicine? This medicine is given in a hospital or clinic. It will not be stored at home. NOTE: This sheet is a summary. It may not cover all possible information. If you have questions about this medicine, talk to your doctor, pharmacist, or health care provider.  2021 Elsevier/Gold Standard (2020-02-21 21:35:50) Doxorubicin injection What is this medicine? DOXORUBICIN (dox oh ROO bi sin) is a chemotherapy drug. It is used to treat many kinds of cancer like leukemia, lymphoma, neuroblastoma, sarcoma, and Wilms' tumor. It is also used to treat bladder cancer, breast cancer, lung cancer, ovarian cancer, stomach cancer, and thyroid cancer. This medicine may be used for other purposes; ask your health care provider or pharmacist if you have questions. COMMON BRAND NAME(S): Adriamycin, Adriamycin PFS, Adriamycin RDF, Rubex What should I tell my health care provider before I take this medicine? They need to know if you have any of these conditions:  heart disease    history of low blood counts caused by a medicine  liver disease  recent or ongoing radiation therapy  an unusual or allergic reaction to doxorubicin, other chemotherapy agents, other medicines, foods, dyes, or preservatives  pregnant or trying to get pregnant  breast-feeding How should I use this medicine? This drug is given as an infusion into a vein. It is administered in a hospital or clinic by a specially trained health care professional. If you have pain, swelling, burning  or any unusual feeling around the site of your injection, tell your health care professional right away. Talk to your pediatrician regarding the use of this medicine in children. Special care may be needed. Overdosage: If you think you have taken too much of this medicine contact a poison control center or emergency room at once. NOTE: This medicine is only for you. Do not share this medicine with others. What if I miss a dose? It is important not to miss your dose. Call your doctor or health care professional if you are unable to keep an appointment. What may interact with this medicine? This medicine may interact with the following medications:  6-mercaptopurine  paclitaxel  phenytoin  St. John's Wort  trastuzumab  verapamil This list may not describe all possible interactions. Give your health care provider a list of all the medicines, herbs, non-prescription drugs, or dietary supplements you use. Also tell them if you smoke, drink alcohol, or use illegal drugs. Some items may interact with your medicine. What should I watch for while using this medicine? This drug may make you feel generally unwell. This is not uncommon, as chemotherapy can affect healthy cells as well as cancer cells. Report any side effects. Continue your course of treatment even though you feel ill unless your doctor tells you to stop. There is a maximum amount of this medicine you should receive throughout your life. The amount depends on the medical condition being treated and your overall health. Your doctor will watch how much of this medicine you receive in your lifetime. Tell your doctor if you have taken this medicine before. You may need blood work done while you are taking this medicine. Your urine may turn red for a few days after your dose. This is not blood. If your urine is dark or brown, call your doctor. In some cases, you may be given additional medicines to help with side effects. Follow all  directions for their use. Call your doctor or health care professional for advice if you get a fever, chills or sore throat, or other symptoms of a cold or flu. Do not treat yourself. This drug decreases your body's ability to fight infections. Try to avoid being around people who are sick. This medicine may increase your risk to bruise or bleed. Call your doctor or health care professional if you notice any unusual bleeding. Talk to your doctor about your risk of cancer. You may be more at risk for certain types of cancers if you take this medicine. Do not become pregnant while taking this medicine or for 6 months after stopping it. Women should inform their doctor if they wish to become pregnant or think they might be pregnant. Men should not father a child while taking this medicine and for 6 months after stopping it. There is a potential for serious side effects to an unborn child. Talk to your health care professional or pharmacist for more information. Do not breast-feed an infant while taking this medicine. This medicine has caused ovarian   failure in some women and reduced sperm counts in some men This medicine may interfere with the ability to have a child. Talk with your doctor or health care professional if you are concerned about your fertility. This medicine may cause a decrease in Co-Enzyme Q-10. You should make sure that you get enough Co-Enzyme Q-10 while you are taking this medicine. Discuss the foods you eat and the vitamins you take with your health care professional. What side effects may I notice from receiving this medicine? Side effects that you should report to your doctor or health care professional as soon as possible:  allergic reactions like skin rash, itching or hives, swelling of the face, lips, or tongue  breathing problems  chest pain  fast or irregular heartbeat  low blood counts - this medicine may decrease the number of white blood cells, red blood cells and  platelets. You may be at increased risk for infections and bleeding.  pain, redness, or irritation at site where injected  signs of infection - fever or chills, cough, sore throat, pain or difficulty passing urine  signs of decreased platelets or bleeding - bruising, pinpoint red spots on the skin, black, tarry stools, blood in the urine  swelling of the ankles, feet, hands  tiredness  weakness Side effects that usually do not require medical attention (report to your doctor or health care professional if they continue or are bothersome):  diarrhea  hair loss  mouth sores  nail discoloration or damage  nausea  red colored urine  vomiting This list may not describe all possible side effects. Call your doctor for medical advice about side effects. You may report side effects to FDA at 1-800-FDA-1088. Where should I keep my medicine? This drug is given in a hospital or clinic and will not be stored at home. NOTE: This sheet is a summary. It may not cover all possible information. If you have questions about this medicine, talk to your doctor, pharmacist, or health care provider.  2021 Elsevier/Gold Standard (2016-12-22 11:01:26) Vincristine injection What is this medicine? VINCRISTINE (vin KRIS teen) is a chemotherapy drug. It slows the growth of cancer cells. This medicine is used to treat many types of cancer like Hodgkin's disease, leukemia, non-Hodgkin's lymphoma, neuroblastoma (brain cancer), rhabdomyosarcoma, and Wilms' tumor. This medicine may be used for other purposes; ask your health care provider or pharmacist if you have questions. COMMON BRAND NAME(S): Oncovin, Vincasar PFS What should I tell my health care provider before I take this medicine? They need to know if you have any of these conditions:  blood disorders  gout  infection (especially chickenpox, cold sores, or herpes)  kidney disease  liver disease  lung disease  nervous system disease like  Charcot-Marie-Tooth (CMT)  recent or ongoing radiation therapy  an unusual or allergic reaction to vincristine, other chemotherapy agents, other medicines, foods, dyes, or preservatives  pregnant or trying to get pregnant  breast-feeding How should I use this medicine? This drug is given as an infusion into a vein. It is administered in a hospital or clinic by a specially trained health care professional. If you have pain, swelling, burning, or any unusual feeling around the site of your injection, tell your health care professional right away. Talk to your pediatrician regarding the use of this medicine in children. While this drug may be prescribed for selected conditions, precautions do apply. Overdosage: If you think you have taken too much of this medicine contact a poison control center or emergency   room at once. NOTE: This medicine is only for you. Do not share this medicine with others. What if I miss a dose? It is important not to miss your dose. Call your doctor or health care professional if you are unable to keep an appointment. What may interact with this medicine?  certain medicines for fungal infections like itraconazole, ketoconazole, posaconazole, voriconazole  certain medicines for seizures like phenytoin This list may not describe all possible interactions. Give your health care provider a list of all the medicines, herbs, non-prescription drugs, or dietary supplements you use. Also tell them if you smoke, drink alcohol, or use illegal drugs. Some items may interact with your medicine. What should I watch for while using this medicine? This drug may make you feel generally unwell. This is not uncommon, as chemotherapy can affect healthy cells as well as cancer cells. Report any side effects. Continue your course of treatment even though you feel ill unless your doctor tells you to stop. You may need blood work done while you are taking this medicine. This medicine will  cause constipation. Try to have a bowel movement at least every 2 to 3 days. If you do not have a bowel movement for 3 days, call your doctor or health care professional. In some cases, you may be given additional medicines to help with side effects. Follow all directions for their use. Do not become pregnant while taking this medicine. Women should inform their doctor if they wish to become pregnant or think they might be pregnant. There is a potential for serious side effects to an unborn child. Talk to your health care professional or pharmacist for more information. Do not breast-feed an infant while taking this medicine. This medicine may make it more difficult to get pregnant or to father a child. Talk to your healthcare professional if you are concerned about your fertility. What side effects may I notice from receiving this medicine? Side effects that you should report to your doctor or health care professional as soon as possible:  allergic reactions like skin rash, itching or hives, swelling of the face, lips, or tongue  breathing problems  confusion or changes in emotions or moods  constipation  cough  mouth sores  muscle weakness  nausea and vomiting  pain, swelling, redness or irritation at the injection site  pain, tingling, numbness in the hands or feet  problems with balance, talking, walking  seizures  stomach pain  trouble passing urine or change in the amount of urine Side effects that usually do not require medical attention (report to your doctor or health care professional if they continue or are bothersome):  diarrhea  hair loss  jaw pain  loss of appetite This list may not describe all possible side effects. Call your doctor for medical advice about side effects. You may report side effects to FDA at 1-800-FDA-1088. Where should I keep my medicine? This drug is given in a hospital or clinic and will not be stored at home. NOTE: This sheet is a  summary. It may not cover all possible information. If you have questions about this medicine, talk to your doctor, pharmacist, or health care provider.  2021 Elsevier/Gold Standard (2019-04-10 17:05:13) Cyclophosphamide Injection What is this medicine? CYCLOPHOSPHAMIDE (sye kloe FOSS fa mide) is a chemotherapy drug. It slows the growth of cancer cells. This medicine is used to treat many types of cancer like lymphoma, myeloma, leukemia, breast cancer, and ovarian cancer, to name a few. This medicine may   be used for other purposes; ask your health care provider or pharmacist if you have questions. COMMON BRAND NAME(S): Cytoxan, Neosar What should I tell my health care provider before I take this medicine? They need to know if you have any of these conditions:  heart disease  history of irregular heartbeat  infection  kidney disease  liver disease  low blood counts, like white cells, platelets, or red blood cells  on hemodialysis  recent or ongoing radiation therapy  scarring or thickening of the lungs  trouble passing urine  an unusual or allergic reaction to cyclophosphamide, other medicines, foods, dyes, or preservatives  pregnant or trying to get pregnant  breast-feeding How should I use this medicine? This drug is usually given as an injection into a vein or muscle or by infusion into a vein. It is administered in a hospital or clinic by a specially trained health care professional. Talk to your pediatrician regarding the use of this medicine in children. Special care may be needed. Overdosage: If you think you have taken too much of this medicine contact a poison control center or emergency room at once. NOTE: This medicine is only for you. Do not share this medicine with others. What if I miss a dose? It is important not to miss your dose. Call your doctor or health care professional if you are unable to keep an appointment. What may interact with this  medicine?  amphotericin B  azathioprine  certain antivirals for HIV or hepatitis  certain medicines for blood pressure, heart disease, irregular heart beat  certain medicines that treat or prevent blood clots like warfarin  certain other medicines for cancer  cyclosporine  etanercept  indomethacin  medicines that relax muscles for surgery  medicines to increase blood counts  metronidazole This list may not describe all possible interactions. Give your health care provider a list of all the medicines, herbs, non-prescription drugs, or dietary supplements you use. Also tell them if you smoke, drink alcohol, or use illegal drugs. Some items may interact with your medicine. What should I watch for while using this medicine? Your condition will be monitored carefully while you are receiving this medicine. You may need blood work done while you are taking this medicine. Drink water or other fluids as directed. Urinate often, even at night. Some products may contain alcohol. Ask your health care professional if this medicine contains alcohol. Be sure to tell all health care professionals you are taking this medicine. Certain medicines, like metronidazole and disulfiram, can cause an unpleasant reaction when taken with alcohol. The reaction includes flushing, headache, nausea, vomiting, sweating, and increased thirst. The reaction can last from 30 minutes to several hours. Do not become pregnant while taking this medicine or for 1 year after stopping it. Women should inform their health care professional if they wish to become pregnant or think they might be pregnant. Men should not father a child while taking this medicine and for 4 months after stopping it. There is potential for serious side effects to an unborn child. Talk to your health care professional for more information. Do not breast-feed an infant while taking this medicine or for 1 week after stopping it. This medicine has  caused ovarian failure in some women. This medicine may make it more difficult to get pregnant. Talk to your health care professional if you are concerned about your fertility. This medicine has caused decreased sperm counts in some men. This may make it more difficult to father a   child. Talk to your health care professional if you are concerned about your fertility. Call your health care professional for advice if you get a fever, chills, or sore throat, or other symptoms of a cold or flu. Do not treat yourself. This medicine decreases your body's ability to fight infections. Try to avoid being around people who are sick. Avoid taking medicines that contain aspirin, acetaminophen, ibuprofen, naproxen, or ketoprofen unless instructed by your health care professional. These medicines may hide a fever. Talk to your health care professional about your risk of cancer. You may be more at risk for certain types of cancer if you take this medicine. If you are going to need surgery or other procedure, tell your health care professional that you are using this medicine. Be careful brushing or flossing your teeth or using a toothpick because you may get an infection or bleed more easily. If you have any dental work done, tell your dentist you are receiving this medicine. What side effects may I notice from receiving this medicine? Side effects that you should report to your doctor or health care professional as soon as possible:  allergic reactions like skin rash, itching or hives, swelling of the face, lips, or tongue  breathing problems  nausea, vomiting  signs and symptoms of bleeding such as bloody or black, tarry stools; red or dark brown urine; spitting up blood or brown material that looks like coffee grounds; red spots on the skin; unusual bruising or bleeding from the eyes, gums, or nose  signs and symptoms of heart failure like fast, irregular heartbeat, sudden weight gain; swelling of the ankles,  feet, hands  signs and symptoms of infection like fever; chills; cough; sore throat; pain or trouble passing urine  signs and symptoms of kidney injury like trouble passing urine or change in the amount of urine  signs and symptoms of liver injury like dark yellow or brown urine; general ill feeling or flu-like symptoms; light-colored stools; loss of appetite; nausea; right upper belly pain; unusually weak or tired; yellowing of the eyes or skin Side effects that usually do not require medical attention (report to your doctor or health care professional if they continue or are bothersome):  confusion  decreased hearing  diarrhea  facial flushing  hair loss  headache  loss of appetite  missed menstrual periods  signs and symptoms of low red blood cells or anemia such as unusually weak or tired; feeling faint or lightheaded; falls  skin discoloration This list may not describe all possible side effects. Call your doctor for medical advice about side effects. You may report side effects to FDA at 1-800-FDA-1088. Where should I keep my medicine? This drug is given in a hospital or clinic and will not be stored at home. NOTE: This sheet is a summary. It may not cover all possible information. If you have questions about this medicine, talk to your doctor, pharmacist, or health care provider.  2021 Elsevier/Gold Standard (2019-02-12 09:53:29)  

## 2020-08-15 NOTE — Plan of Care (Signed)
Dr. Ree Kida of [facility] 2[facility]8.

## 2020-08-15 NOTE — ED Notes (Addendum)
[place] IV attempted. No IV or additional bloodwork obtained. Phlebotomy coming for recollect. MD aware.

## 2020-08-15 NOTE — H&P (Addendum)
History and Physical    GENNETTE Farrell DGL:[id number] DOB: March 26, 1946 DOA: 08/15/2020  Referring MD/NP/PA:   PCP: [name], MD   Patient coming from:  The patient is coming from home.  At baseline, pt is independent for most of ADL.        Chief Complaint: SOB  HPI: Natasha Farrell is a 75 y.o. female with medical history significant of hypertension, hyperlipidemia, hypothyroidism, gout, thrombocytopenia, extensive adenopathy, splenomegaly, pleural effusion, dCHF, who presents with SOB.  Patient had left thoracentesis and had 1.6 L of bloody pleural fluid removed by IR in this morning. She developed shortness of breath, which has been progressively worsening.  Patient also has cough with yellow-colored mucus production.  Patient does not have chest pain, fever or chills.  She has nausea, no vomiting, diarrhea or abdominal pain no symptoms of UTI. Patient was found to have acute respiratory distress, with oxygen desaturation to 86% on room air, which improved to 96% on 4 L oxygen in ED. Of note, pt has extensive adenopathy and splenomegaly, currently being worked up for malignancy by oncologist, Dr. Mike Gip.  ED Course: pt was found to have WBC 16.9, BNP 22.8, pending COVID-19 PCR, troponin level 7, sodium 122, renal function okay, temperature normal, blood pressure 125/54, heart rate 120, RR 35.  Chest x-ray showed possible re-expansion pulmonary edema versus infiltration in left lower lobe.  Pt is admitted to progressive bed as inpatient.  CXR showed: 1. Interval development of confluent alveolar airspace disease in the LEFT lower lobe concerning for re-expansion pulmonary edema versus pneumonia or aspiration. 2. No visible pneumothorax. 3. Small residual LEFT-sided effusion and small RIGHT-sided effusion. 4. Subtle opacity at the RIGHT lung base may represent atelectasis or developing pneumonia.  Review of Systems:   General: no fevers, chills, no body weight gain, has  fatigue HEENT: no blurry vision, hearing changes or sore throat Respiratory: has dyspnea, coughing, no wheezing CV: no chest pain, no palpitations GI: no nausea, vomiting, abdominal pain, diarrhea, constipation GU: no dysuria, burning on urination, increased urinary frequency, hematuria  Ext: has leg edema Neuro: no unilateral weakness, numbness, or tingling, no vision change or hearing loss Skin: no rash, no skin tear. MSK: No muscle spasm, no deformity, no limitation of range of movement in spin Heme: No bruising.  Travel history: No recent long distant travel.  Allergy:  Allergies  Allergen Reactions  . Lisinopril Swelling    angioedema    Past Medical History:  Diagnosis Date  . Hx of hysterectomy   . Hyperlipidemia   . Hypertension   . Thyroid disease    rt thryroidectomy  09/25/2010    Past Surgical History:  Procedure Laterality Date  . BREAST BIOPSY Right 04/21/2020   hydromark 3 [place] bx path pending  . LYMPH NODE BIOPSY Left 08/01/2020   Procedure: LYMPH NODE BIOPSY;  Surgeon: Robert Bellow, MD;  Location: [facility] ORS;  Service: General;  Laterality: Left;    Social History:  reports that she has never smoked. She has never used smokeless tobacco. She reports that she does not drink alcohol and does not use drugs.  Family History:  Family History  Problem Relation Age of Onset  . Breast cancer Sister 56  . Colon cancer Sister   . Kidney cancer Sister      Prior to Admission medications   Medication Sig Start Date End Date Taking? Authorizing Provider  allopurinol (ZYLOPRIM) 300 MG tablet Take 1 tablet (300 mg total)  by mouth daily. 07/28/20   Lequita Asal, MD  amLODipine (NORVASC) 10 MG tablet Take 1 tablet by mouth daily. 04/29/20   [provider]  carvedilol (COREG) 25 MG tablet Take 1 tablet (25 mg total) by mouth 2 (two) times daily. 08/06/20 08/01/21  Furth, Cadence H, PA-C  cholecalciferol (VITAMIN D3) 25 MCG (1000 UNIT) tablet Take 2,000  Units by mouth daily.    [provider]  loratadine (CLARITIN) 10 MG tablet Take 10 mg by mouth daily as needed for allergies.    [provider]  pravastatin (PRAVACHOL) 40 MG tablet Take 40 mg by mouth daily.    [provider]  spironolactone (ALDACTONE) 50 MG tablet Take 50 mg by mouth daily. 07/25/20   [provider]  SYNTHROID 50 MCG tablet Take 1 tablet (50 mcg total) by mouth daily. 12/09/10 12/09/11  Armandina Gemma, MD    Physical Exam: Vitals:   08/15/20 1510 08/15/20 1630 08/15/20 1700 08/15/20 1800  BP: (!) 125/54 132/77 137/61 107/65  Pulse: (!) 113 (!) 114 (!) 116 (!) 124  Resp: (!) 30 (!) 28 (!) 23 18  Temp:    98 F (36.7 C)  TempSrc:      SpO2: 96% 94% 93% 93%  Weight:      Height:       General: Not in acute distress HEENT:       Eyes: PERRL, EOMI, no scleral icterus.       ENT: No discharge from the ears and nose, no pharynx injection, no tonsillar enlargement.        Neck: No JVD, no bruit, no mass felt. Heme: no bruises Cardiac: S1/S2, RRR, No murmurs, No gallops or rubs. Respiratory: has decreased air movement on the left side GI: Soft, nondistended, nontender, no rebound pain, BS present. GU: No hematuria Ext: 1+ pitting leg edema bilaterally. 1+DP/PT pulse bilaterally. Musculoskeletal: No joint deformities, No joint redness or warmth, no limitation of ROM in spin. Skin: No rashes.  Neuro: Alert, oriented X3, cranial nerves II-XII grossly intact, moves all extremities normally. Psych: Patient is not psychotic, no suicidal or hemocidal ideation.  Labs on Admission: I have personally reviewed following labs and imaging studies  CBC: Recent Labs  Lab 08/14/20 1519 08/15/20 1342  WBC 14.9* 16.9*  NEUTROABS 10.6* 11.7*  HGB 10.5* 13.1  HCT 30.3* 39.0  MCV 84.6 88.6  PLT 41* 44*   Basic Metabolic Panel: Recent Labs  Lab 08/14/20 1519 08/15/20 1448 08/15/20 1715  NA 122* 122* 122*  K 4.1 5.1 4.7  CL 89* 88* 87*   CO2 22 20* 20*  GLUCOSE 114* 149* 131*  BUN 11 18 19   CREATININE 0.68 0.84 0.82  CALCIUM 9.6 9.2 9.0   GFR: Estimated Creatinine Clearance: 63.4 mL/min (by C-G formula based on SCr of 0.82 mg/dL). Liver Function Tests: Recent Labs  Lab 08/15/20 1448  AST 31  ALT 14  ALKPHOS 92  BILITOT 1.7*  PROT 7.7  ALBUMIN 3.7   No results for input(s): LIPASE, AMYLASE in the last 168 hours. No results for input(s): AMMONIA in the last 168 hours. Coagulation Profile: No results for input(s): INR, PROTIME in the last 168 hours. Cardiac Enzymes: No results for input(s): CKTOTAL, CKMB, CKMBINDEX, TROPONINI in the last 168 hours. BNP (last 3 results) No results for input(s): PROBNP in the last 8760 hours. HbA1C: No results for input(s): HGBA1C in the last 72 hours. CBG: Recent Labs  Lab 08/12/20 0757  GLUCAP 109*  Lipid Profile: No results for input(s): CHOL, HDL, LDLCALC, TRIG, CHOLHDL, LDLDIRECT in the last 72 hours. Thyroid Function Tests: No results for input(s): TSH, T4TOTAL, FREET4, T3FREE, THYROIDAB in the last 72 hours. Anemia Panel: No results for input(s): VITAMINB12, FOLATE, FERRITIN, TIBC, IRON, RETICCTPCT in the last 72 hours. Urine analysis:    Component Value Date/Time   COLORURINE YELLOW (A) 07/21/2020 1124   APPEARANCEUR HAZY (A) 07/21/2020 1124   LABSPEC 1.017 07/21/2020 1124   PHURINE 5.0 07/21/2020 1124   GLUCOSEU NEGATIVE 07/21/2020 1124   HGBUR NEGATIVE 07/21/2020 1124   BILIRUBINUR NEGATIVE 07/21/2020 1124   KETONESUR NEGATIVE 07/21/2020 1124   PROTEINUR 100 (A) 07/21/2020 1124   UROBILINOGEN 0.2 09/21/2010 1140   NITRITE NEGATIVE 07/21/2020 1124   LEUKOCYTESUR NEGATIVE 07/21/2020 1124   Sepsis Labs: @LABRCNTIP (procalcitonin:4,lacticidven:4) ) Recent Results (from the past 240 hour(s))  SARS CORONAVIRUS 2 (TAT 6-24 HRS) Nasopharyngeal Nasopharyngeal Swab     Status: None   Collection Time: 08/13/20 11:15 AM   Specimen: Nasopharyngeal Swab   Result Value Ref Range Status   SARS Coronavirus 2 NEGATIVE NEGATIVE Final    Comment: (NOTE) SARS-CoV-2 target nucleic acids are NOT DETECTED.  The SARS-CoV-2 RNA is generally detectable in upper and lower respiratory specimens during the acute phase of infection. Negative results do not preclude SARS-CoV-2 infection, do not rule out co-infections with other pathogens, and should not be used as the sole basis for treatment or other patient management decisions. Negative results must be combined with clinical observations, patient history, and epidemiological information. The expected result is Negative.  Fact Sheet for Patients: SugarRoll.be  Fact Sheet for Healthcare Providers: https://www.woods-mathews.com/  This test is not yet approved or cleared by the [place] FDA and  has been authorized for detection and/or diagnosis of SARS-CoV-2 by FDA under an Emergency Use Authorization (EUA). This EUA will remain  in effect (meaning this test can be used) for the duration of the COVID-19 declaration under Se ction 564(b)(1) of the Act, 21 U.S.C. section 360bbb-3(b)(1), unless the authorization is terminated or revoked sooner.  Performed at [facility], [place] [address], [place], [place] [address]   Resp Panel by RT-PCR (Flu A&B, Covid) Nasopharyngeal Swab     Status: None   Collection Time: 08/15/20  3:42 PM   Specimen: Nasopharyngeal Swab; Nasopharyngeal(NP) swabs in vial transport medium  Result Value Ref Range Status   SARS Coronavirus 2 by RT PCR NEGATIVE NEGATIVE Final    Comment: (NOTE) SARS-CoV-2 target nucleic acids are NOT DETECTED.  The SARS-CoV-2 RNA is generally detectable in upper respiratory specimens during the acute phase of infection. The lowest concentration of SARS-CoV-2 viral copies this assay can detect is 138 copies/mL. A negative result does not preclude SARS-Cov-2 infection and should not be used as  the sole basis for treatment or other patient management decisions. A negative result may occur with  improper specimen collection/handling, submission of specimen other than nasopharyngeal swab, presence of viral mutation(s) within the areas targeted by this assay, and inadequate number of viral copies(<138 copies/mL). A negative result must be combined with clinical observations, patient history, and epidemiological information. The expected result is Negative.  Fact Sheet for Patients:  EntrepreneurPulse.com.au  Fact Sheet for Healthcare Providers:  IncredibleEmployment.be  This test is no t yet approved or cleared by the [place] FDA and  has been authorized for detection and/or diagnosis of SARS-CoV-2 by FDA under an Emergency Use Authorization (EUA). This EUA will remain  in effect (  meaning this test can be used) for the duration of the COVID-19 declaration under Section 564(b)(1) of the Act, 21 U.S.C.section 360bbb-3(b)(1), unless the authorization is terminated  or revoked sooner.       Influenza A by PCR NEGATIVE NEGATIVE Final   Influenza B by PCR NEGATIVE NEGATIVE Final    Comment: (NOTE) The Xpert Xpress SARS-CoV-2/FLU/RSV plus assay is intended as an aid in the diagnosis of influenza from Nasopharyngeal swab specimens and should not be used as a sole basis for treatment. Nasal washings and aspirates are unacceptable for Xpert Xpress SARS-CoV-2/FLU/RSV testing.  Fact Sheet for Patients: EntrepreneurPulse.com.au  Fact Sheet for Healthcare Providers: IncredibleEmployment.be  This test is not yet approved or cleared by the [place] FDA and has been authorized for detection and/or diagnosis of SARS-CoV-2 by FDA under an Emergency Use Authorization (EUA). This EUA will remain in effect (meaning this test can be used) for the duration of the COVID-19 declaration under Section 564(b)(1) of  the Act, 21 U.S.C. section 360bbb-3(b)(1), unless the authorization is terminated or revoked.  Performed at [facility], [address] [place], [place] [address]      Radiological Exams on Admission: DG Chest Portable 1 View  Result Date: 08/15/2020 CLINICAL DATA:  Shortness of breath, thoracentesis earlier today, worsening since discharge by report. EXAM: PORTABLE CHEST 1 VIEW COMPARISON:  August 15, 2020 10:47 a.m. FINDINGS: Since the previous, very recent imaging study there is been interval development of confluent alveolar airspace disease in the LEFT lower lobe. The LEFT hemidiaphragm remains obscured, LEFT heart border now obscured when compared to the prior study. Blunting of LEFT costodiaphragmatic sulcus compatible with residual effusion is similar to the prior study. Blunting of RIGHT costodiaphragmatic sulcus is unchanged, subtle RIGHT lower lobe airspace disease. Trachea is midline. Cardiomediastinal contours are stable grossly though limited assessment due to the presence of airspace disease in the LEFT chest. No sign of pneumothorax. On limited assessment no acute skeletal process. EKG leads project over the chest. IMPRESSION: 1. Interval development of confluent alveolar airspace disease in the LEFT lower lobe concerning for re-expansion pulmonary edema versus pneumonia or aspiration. 2. No visible pneumothorax. 3. Small residual LEFT-sided effusion and small RIGHT-sided effusion. 4. Subtle opacity at the RIGHT lung base may represent atelectasis or developing pneumonia. Electronically Signed   By: Zetta Bills M.D.   On: 08/15/2020 14:27   DG Chest Port 1 View  Result Date: 08/15/2020 CLINICAL DATA:  Status post left thoracentesis EXAM: PORTABLE CHEST 1 VIEW COMPARISON:  08/01/2020 FINDINGS: Cardiomediastinal silhouette and pulmonary vasculature are within normal limits. No pneumothorax status post left thoracentesis. Significant interval reduction of left pleural  effusion. Small bilateral pleural effusion still remain. IMPRESSION: No pneumothorax status post left thoracentesis. Electronically Signed   By: Miachel Roux M.D.   On: 08/15/2020 10:55   US THORACENTESIS ASP PLEURAL SPACE W/IMG GUIDE  Result Date: 08/15/2020 INDICATION: Lymphoma. Shortness of breath. Large left pleural effusion. Request diagnostic therapeutic thoracentesis. EXAM: ULTRASOUND GUIDED LEFT THORACENTESIS MEDICATIONS: 1% plain lidocaine, 8 mL COMPLICATIONS: None immediate. PROCEDURE: An ultrasound guided thoracentesis was thoroughly discussed with the patient and questions answered. The benefits, risks, alternatives and complications were also discussed. The patient understands and wishes to proceed with the procedure. Written consent was obtained. Ultrasound was performed to localize and mark an adequate pocket of fluid in the left chest. The area was then prepped and draped in the normal sterile fashion. 1% Lidocaine was used for local anesthesia. Under ultrasound  guidance a 6 Fr Safe-T-Centesis catheter was introduced. Thoracentesis was performed. The catheter was removed and a dressing applied. FINDINGS: A total of approximately 1.6 L of bloody pleural fluid was removed. Samples were sent to the laboratory as requested by the clinical team. IMPRESSION: Successful ultrasound guided left thoracentesis yielding 1.6 L of pleural fluid. Read by: Ascencion Dike PA-C Electronically Signed   By: Miachel Roux M.D.   On: 08/15/2020 11:23     EKG: Not done in ED, will get one.   Assessment/Plan Principal Problem:   Acute respiratory failure with hypoxia (HCC) Active Problems:   Essential hypertension   Splenomegaly   Pleural effusion, left   Thrombocytopenia (HCC)   Hyperlipidemia   Chronic diastolic CHF (congestive heart failure) (HCC)   Hyponatremia   CAP (community acquired pneumonia)   Severe sepsis ([place])   Acute respiratory failure with hypoxia: Etiology is not clear.  Differential  diagnosis include expansion pulmonary edema after thoracentesis and CAP.  Patient has leukocytosis, tachycardia with heart rate of 120, tachypnea with RR 25, meeting criteria for severe sepsis.  Will start antibiotics.  Patient has elevated lactic acid of 2.8.  Will not start IV fluid due to suspected expansion pulmonary edema.  -Admitted to progressive unit as inpatient - pt was given 40 mg of lasix in ED -->will not give more lasix due to sepsis - IV Rocephin and azithromycin - Mucinex for cough  - Bronchodilators - Urine legionella and S. pneumococcal antigen - Follow up blood culture x2, sputum culture - will get Procalcitonin and trend lactic acid level per sepsis protocol  Severe sepsis due to CAP: -See above  Essential hypertension -Hold amlodipine due to severe sepsis and high risk of developing hypotension -Hold spironolactone due to hyponatremia -Continue Coreg -IV hydralazine as needed  Splenomegaly: pt has extensive adenopathy and splenomegaly, currently being worked up for malignancy by oncologist, Dr. Mike Gip. Please see her clinic note in detail on 3/24 -f/u with Dr. Mike Gip  Pleural effusion, left -s/p of thoracentesis -Follow-up fluid analysis  Thrombocytopenia ([place]): Platelet 44.  No active bleeding -Follow-up of CBC  Hyperlipidemia -Pravastatin  Chronic diastolic CHF (congestive heart failure) [facility]): Patient has 1+ leg edema, but BNP is normal at 22.8, does not seem to have a CHF exacerbation.  2D echo on 07/24/2020 showed EF 60-65% with grade 1 diastolic dysfunction. -Watch volume status closely -Hold spironolactone due to sepsis and hyponatremia  Hyponatremia: Sodium 122.  Mental status normal.  Likely due to poor oral intake and dehydration, SIADH is on  -pt received 40 mg of lV lasix in Ed - Will check urine sodium, urine osmolality, serum osmolality. - Fluid restriction - f/u by BMP q8h - avoid over correction too fast due to risk of central pontine  myelinolysis  We have we needed to require for whole day   DVT ppx: SCD Code Status: Full code Family Communication:   Yes, patient's husband at bed side Disposition Plan:  Anticipate discharge back to previous environment Consults called:  none Admission status and Level of care: Progressive Cardiac:  as inpt       Status is: Inpatient  Remains inpatient appropriate because:Inpatient level of care appropriate due to severity of illness   Dispo: The patient is from: Home              Anticipated d/c is to: Home              Patient currently is not medically stable to d/c.  Difficult to place patient No          Date of Service 08/15/2020    [facility]   If 7PM-7AM, please contact night-coverage [email or web address] 08/15/2020, 6:37 PM

## 2020-08-15 NOTE — ED Provider Notes (Signed)
[facility] Emergency Department Provider Note  ____________________________________________   Event Date/Time   First MD Initiated Contact with Patient 08/15/20 1334     (approximate)  I have reviewed the triage vital signs and the nursing notes.   HISTORY  Chief Complaint Emesis    HPI Natasha Farrell is a 75 y.o. female with recent work-up for possible lymphoma and left pleural effusion who comes in for shortness of breath.  Patient had ultrasound-guided thoracentesis this morning where they removed 2 L of fluid.  Patient was told that she might have some coughing afterwards.  Patient states that she has been coughing up a lot of yellow phlegm and then developed worsening shortness of breath, severe, constant, nothing made it better, nothing made it worse.  Denies any abdominal pain or vomiting.  Denies any falls or hitting her head.  Contrary to the triage note there is no vomiting is coughing up sputum.          Past Medical History:  Diagnosis Date  . Hx of hysterectomy   . Hyperlipidemia   . Hypertension   . Thyroid disease    rt thryroidectomy  09/25/2010    Patient Active Problem List   Diagnosis Date Noted  . Pleural effusion, left 08/14/2020  . Goals of care, counseling/discussion 08/14/2020  . Thrombocytopenia ([place]) 08/14/2020  . Adenopathy 07/26/2020  . Splenomegaly 07/26/2020  . Sinus tachycardia 07/23/2020  . Shortness of breath 07/23/2020  . Chest pain of uncertain etiology 00/86/7619  . Left upper quadrant abdominal pain 07/23/2020  . Essential hypertension 07/23/2020  . Thyroid nodule, uninodular 12/09/2010    Past Surgical History:  Procedure Laterality Date  . BREAST BIOPSY Right 04/21/2020   hydromark 3 [place] bx path pending  . LYMPH NODE BIOPSY Left 08/01/2020   Procedure: LYMPH NODE BIOPSY;  Surgeon: Robert Bellow, MD;  Location: [facility] ORS;  Service: General;  Laterality: Left;    Prior to Admission medications    Medication Sig Start Date End Date Taking? Authorizing Provider  allopurinol (ZYLOPRIM) 300 MG tablet Take 1 tablet (300 mg total) by mouth daily. 07/28/20   Lequita Asal, MD  amLODipine (NORVASC) 10 MG tablet Take 1 tablet by mouth daily. 04/29/20   [provider]  carvedilol (COREG) 25 MG tablet Take 1 tablet (25 mg total) by mouth 2 (two) times daily. 08/06/20 08/01/21  Furth, Cadence H, PA-C  cholecalciferol (VITAMIN D3) 25 MCG (1000 UNIT) tablet Take 2,000 Units by mouth daily.    [provider]  loratadine (CLARITIN) 10 MG tablet Take 10 mg by mouth daily as needed for allergies.    [provider]  pravastatin (PRAVACHOL) 40 MG tablet Take 40 mg by mouth daily.    [provider]  spironolactone (ALDACTONE) 50 MG tablet Take 50 mg by mouth daily. 07/25/20   [provider]  SYNTHROID 50 MCG tablet Take 1 tablet (50 mcg total) by mouth daily. 12/09/10 12/09/11  Armandina Gemma, MD    Allergies Lisinopril  Family History  Problem Relation Age of Onset  . Breast cancer Sister 22  . Colon cancer Sister   . Kidney cancer Sister     Social History Social History   Tobacco Use  . Smoking status: Never Smoker  . Smokeless tobacco: Never Used  Vaping Use  . Vaping Use: Never used  Substance Use Topics  . Alcohol use: No  . Drug use: No      Review of  Systems Constitutional: No fever/chills Eyes: No visual changes. ENT: No sore throat. Cardiovascular: No chest pain Respiratory: Positive for SOB, coughing Gastrointestinal: No abdominal pain.  No nausea, no vomiting.  No diarrhea.  No constipation. Genitourinary: Negative for dysuria. Musculoskeletal: Negative for back pain. Skin: Negative for rash. Neurological: Negative for headaches, focal weakness or numbness. All other ROS negative ____________________________________________   PHYSICAL EXAM:  VITAL SIGNS: ED Triage Vitals  Enc Vitals Group     BP 08/15/20 1331 133/77      Pulse Rate 08/15/20 1331 (!) 111     Resp 08/15/20 1331 (!) 29     Temp 08/15/20 1331 98.6 F (37 C)     Temp Source 08/15/20 1331 Oral     SpO2 08/15/20 1331 (!) 86 %     Weight 08/15/20 1335 176 lb 5.9 oz (80 kg)     Height 08/15/20 1335 5\' 4"  (1.626 m)     Head Circumference --      Peak Flow --      Pain Score 08/15/20 1335 8     Pain Loc --      Pain Edu? --      Excl. in [place]? --     Constitutional: Alert and oriented. Well appearing and in no acute distress. Eyes: Conjunctivae are normal. EOMI. Head: Atraumatic. Nose: No congestion/rhinnorhea. Mouth/Throat: Mucous membranes are moist.   Neck: No stridor. Trachea Midline. FROM Cardiovascular tachycardic, regular rhythm. Grossly normal heart sounds.  Good peripheral circulation. Respiratory: Increased work of breathing, lung sounds bilaterally, on 3 L Gastrointestinal: Soft and nontender. No distention. No abdominal bruits.  Musculoskeletal: 1+ edema bilaterally.  No joint effusions. Neurologic:  Normal speech and language. No gross focal neurologic deficits are appreciated.  Skin:  Skin is warm, dry and intact. No rash noted. Psychiatric: Mood and affect are normal. Speech and behavior are normal. GU: Deferred   ____________________________________________   LABS (all labs ordered are listed, but only abnormal results are displayed)  Labs Reviewed  CBC WITH DIFFERENTIAL/PLATELET - Abnormal; Notable for the following components:      Result Value   WBC 16.9 (*)    RDW 17.4 (*)    Platelets 44 (*)    Neutro Abs 11.7 (*)    Monocytes Absolute 1.8 (*)    Abs Immature Granulocytes 0.85 (*)    All other components within normal limits  COMPREHENSIVE METABOLIC PANEL - Abnormal; Notable for the following components:   Sodium 122 (*)    Chloride 88 (*)    CO2 20 (*)    Glucose, Bld 149 (*)    Total Bilirubin 1.7 (*)    All other components within normal limits  RESP PANEL BY RT-PCR (FLU A&B, COVID) ARPGX2  BRAIN  NATRIURETIC PEPTIDE  BASIC METABOLIC PANEL  BASIC METABOLIC PANEL  TROPONIN I (HIGH SENSITIVITY)   ____________________________________________   ED ECG REPORT I, Vanessa Hickory, the attending physician, personally viewed and interpreted this ECG.  Sinus tachycardia rate of 118, no ST elevation, no T wave versions, normal intervals ____________________________________________  RADIOLOGY Robert Bellow, personally viewed and evaluated these images (plain radiographs) as part of my medical decision making, as well as reviewing the written report by the radiologist.  ED MD interpretation:  Effusion on L   Official radiology report(s): DG Chest Port 1 View  Result Date: 08/15/2020 CLINICAL DATA:  Status post left thoracentesis EXAM: PORTABLE CHEST 1 VIEW COMPARISON:  08/01/2020 FINDINGS: Cardiomediastinal silhouette and pulmonary vasculature are  within normal limits. No pneumothorax status post left thoracentesis. Significant interval reduction of left pleural effusion. Small bilateral pleural effusion still remain. IMPRESSION: No pneumothorax status post left thoracentesis. Electronically Signed   By: Miachel Roux M.D.   On: 08/15/2020 10:55    ____________________________________________   PROCEDURES  Procedure(s) performed (including Critical Care):  .Critical Care Performed by: Vanessa [place], MD Authorized by: Vanessa Rowley, MD   Critical care provider statement:    Critical care time (minutes):  45   Critical care was necessary to treat or prevent imminent or life-threatening deterioration of the following conditions:  Respiratory failure   Critical care was time spent personally by me on the following activities:  Discussions with consultants, evaluation of patient's response to treatment, examination of patient, ordering and performing treatments and interventions, ordering and review of laboratory studies, ordering and review of radiographic studies, pulse oximetry,  re-evaluation of patient's condition, obtaining history from patient or surrogate and review of old charts .1-3 Lead EKG Interpretation Performed by: Vanessa [place], MD Authorized by: Vanessa [place], MD     Interpretation: abnormal     ECG rate:  110s    ECG rate assessment: tachycardic     Rhythm: sinus tachycardia     Ectopy: none     Conduction: normal       ____________________________________________   INITIAL IMPRESSION / ASSESSMENT AND PLAN / ED COURSE   Natasha Farrell was evaluated in Emergency Department on 08/15/2020 for the symptoms described in the history of present illness. She was evaluated in the context of the global COVID-19 pandemic, which necessitated consideration that the patient might be at risk for infection with the SARS-CoV-2 virus that causes COVID-19. Institutional protocols and algorithms that pertain to the evaluation of patients at risk for COVID-19 are in a state of rapid change based on information released by regulatory bodies including the CDC and federal and state organizations. These policies and algorithms were followed during the patient's care in the ED.     Pt presents with SOB.  Initially was most concerned about a tension pneumothorax and I immediately evaluated patient but she had breath sounds on both sides.  Will get chest x-ray to further evaluate and keep patient on the cardiac monitor and 3 L of oxygen while proceeding with further work-up.  PNA-will get xray to evaluation Anemia-CBC to evaluate ACS- will get trops Arrhythmia-Will get EKG and keep on monitor.  COVID- will get testing per algorithm. PE-lower suspicion given no risk factors and other cause more likely  Chest x-ray shows reexpansion pulmonary edema.  Will give patient 40 of IV Lasix.  Will discuss with hospital team for admission given new hypoxia.  Patient also noted to be hyponatremic on her labs.                ____________________________________________   FINAL CLINICAL IMPRESSION(S) / ED DIAGNOSES   Final diagnoses:  Acute respiratory failure with hypoxia (HCC)  Hyponatremia  Acute pulmonary edema (HCC)     MEDICATIONS GIVEN DURING THIS VISIT:  Medications  ondansetron (ZOFRAN) injection 4 mg (has no administration in time range)  acetaminophen (TYLENOL) tablet 650 mg (has no administration in time range)  hydrALAZINE (APRESOLINE) injection 5 mg (has no administration in time range)  cefTRIAXone (ROCEPHIN) 1 g in sodium chloride 0.9 % 100 mL IVPB (0 g Intravenous Stopped 08/15/20 2001)  azithromycin (ZITHROMAX) 500 mg in sodium chloride 0.9 % 250 mL IVPB (500 mg Intravenous New  Bag/Given 08/15/20 2219)  allopurinol (ZYLOPRIM) tablet 300 mg (300 mg Oral Given 08/16/20 0844)  carvedilol (COREG) tablet 25 mg (25 mg Oral Given 08/16/20 0845)  pravastatin (PRAVACHOL) tablet 40 mg (40 mg Oral Given 08/16/20 0844)  levothyroxine (SYNTHROID) tablet 50 mcg (50 mcg Oral Given 08/16/20 0426)  cholecalciferol (VITAMIN D3) tablet 2,000 Units (2,000 Units Oral Given 08/16/20 0845)  loratadine (CLARITIN) tablet 10 mg (has no administration in time range)  benzonatate (TESSALON) capsule 100 mg (has no administration in time range)  dextromethorphan-guaiFENesin (MUCINEX DM) 30-600 MG per 12 hr tablet 1 tablet (1 tablet Oral Given 08/16/20 0849)  albuterol (VENTOLIN HFA) 108 (90 Base) MCG/ACT inhaler 2 puff (2 puffs Inhalation Given 08/16/20 0916)  ipratropium (ATROVENT HFA) inhaler 2 puff (has no administration in time range)  sodium chloride tablet 1 g (has no administration in time range)  furosemide (LASIX) injection 40 mg (40 mg Intravenous Given 08/15/20 1510)     ED Discharge Orders    None       Note:  This document was prepared using Dragon voice recognition software and may include unintentional dictation errors.   Vanessa Penrose, MD 08/16/20 1501

## 2020-08-15 NOTE — ED Triage Notes (Signed)
First Nurse Note:  Arrives with spouse for c/o weakness, vomiting and 'growl in chest".  Patient had fluid drawn from left lung this morning and has not felt well since discharge.  No SOB/ DOE.  Voice clear.  Skin pale and dry.

## 2020-08-15 NOTE — ED Notes (Addendum)
Pt RA sats at 86%, placed on 4L via nasal cannula and taken to room 1 to finish triage. Md Mead at bedside. Pt sats increased to 95% on 4L [place].

## 2020-08-15 NOTE — ED Notes (Signed)
Phlebotomy obtained bloodwork

## 2020-08-15 NOTE — ED Notes (Signed)
Dr Niu at bedside 

## 2020-08-15 NOTE — ED Notes (Signed)
Phlebotomy called for additional blood work

## 2020-08-16 DIAGNOSIS — J9601 Acute respiratory failure with hypoxia: Secondary | ICD-10-CM | POA: Diagnosis not present

## 2020-08-16 LAB — CBC
HCT: 33 % — ABNORMAL LOW (ref 36.0–46.0)
Hemoglobin: 11.4 g/dL — ABNORMAL LOW (ref 12.0–15.0)
MCH: 30.2 pg (ref 26.0–34.0)
MCHC: 34.5 g/dL (ref 30.0–36.0)
MCV: 87.5 fL (ref 80.0–100.0)
Platelets: 45 10*3/uL — ABNORMAL LOW (ref 150–400)
RBC: 3.77 MIL/uL — ABNORMAL LOW (ref 3.87–5.11)
RDW: 17.2 % — ABNORMAL HIGH (ref 11.5–15.5)
WBC: 17 10*3/uL — ABNORMAL HIGH (ref 4.0–10.5)
nRBC: 0.1 % (ref 0.0–0.2)

## 2020-08-16 LAB — CORTISOL-AM, BLOOD: Cortisol - AM: 53.2 ug/dL — ABNORMAL HIGH (ref 6.7–22.6)

## 2020-08-16 LAB — BASIC METABOLIC PANEL
Anion gap: 12 (ref 5–15)
BUN: 19 mg/dL (ref 8–23)
CO2: 23 mmol/L (ref 22–32)
Calcium: 8.5 mg/dL — ABNORMAL LOW (ref 8.9–10.3)
Chloride: 89 mmol/L — ABNORMAL LOW (ref 98–111)
Creatinine, Ser: 0.81 mg/dL (ref 0.44–1.00)
GFR, Estimated: >60 mL/min (ref >60)
Glucose, Bld: 106 mg/dL — ABNORMAL HIGH (ref 70–99)
Potassium: 4.3 mmol/L (ref 3.5–5.1)
Sodium: 124 mmol/L — ABNORMAL LOW (ref 135–145)

## 2020-08-16 LAB — STREP PNEUMONIAE URINARY ANTIGEN: Strep Pneumo Urinary Antigen: NEGATIVE

## 2020-08-16 LAB — HIV ANTIBODY (ROUTINE TESTING W REFLEX): HIV Screen 4th Generation wRfx: NONREACTIVE

## 2020-08-16 MED ORDER — DM-GUAIFENESIN ER 30-600 MG PO TB12
1.0000 | ORAL_TABLET | Freq: Two times a day (BID) | ORAL | Status: DC
  Administered 2020-08-16 – 2020-08-20 (×9): 1 via ORAL
  Filled 2020-08-16 (×9): qty 1

## 2020-08-16 MED ORDER — IPRATROPIUM BROMIDE HFA 17 MCG/ACT IN AERS
2.0000 | INHALATION_SPRAY | Freq: Four times a day (QID) | RESPIRATORY_TRACT | Status: DC
  Administered 2020-08-16 – 2020-08-20 (×16): 2 via RESPIRATORY_TRACT
  Filled 2020-08-16: qty 12.9

## 2020-08-16 MED ORDER — BENZONATATE 100 MG PO CAPS
100.0000 mg | ORAL_CAPSULE | Freq: Three times a day (TID) | ORAL | Status: DC | PRN
  Administered 2020-08-16 – 2020-08-19 (×6): 100 mg via ORAL
  Filled 2020-08-16 (×6): qty 1

## 2020-08-16 MED ORDER — GUAIFENESIN-DM 100-10 MG/5ML PO SYRP
5.0000 mL | ORAL_SOLUTION | ORAL | Status: DC | PRN
  Administered 2020-08-16 – 2020-08-19 (×5): 5 mL via ORAL
  Filled 2020-08-16 (×5): qty 5

## 2020-08-16 MED ORDER — ALBUTEROL SULFATE HFA 108 (90 BASE) MCG/ACT IN AERS
2.0000 | INHALATION_SPRAY | Freq: Four times a day (QID) | RESPIRATORY_TRACT | Status: DC
  Administered 2020-08-16 – 2020-08-20 (×17): 2 via RESPIRATORY_TRACT
  Filled 2020-08-16: qty 6.7

## 2020-08-16 MED ORDER — SODIUM CHLORIDE 1 G PO TABS
1.0000 g | ORAL_TABLET | Freq: Two times a day (BID) | ORAL | Status: DC
  Administered 2020-08-16 – 2020-08-20 (×8): 1 g via ORAL
  Filled 2020-08-16 (×9): qty 1

## 2020-08-16 NOTE — Consult Note (Signed)
Natasha Farrell MRN: [id number] DOB/AGE: [birth year] 75 y.o. Primary Care Physician:McLaughlin, Wilhemena Durie, MD Admit date: 08/15/2020 Chief Complaint:  Chief Complaint  Patient presents with   Emesis   HPI: Natasha Farrell is a 75 year old Caucasian  female with a past medical history Hypertension, hyperlipidemia, hypothyroidism, gout, thrombocytopenia, extensive adenopathy, splenomegaly, pleural effusion,  diastolic CHF, who presented to ER with chief complaint of shortness of breath.  History of present illness date backs to past few week when patient was being seen at oncology office.  Data from care everywhere/epic shows as patient was having shortness of breath with oncologist office on March 24 visit.  It was noted that patient left pleural effusion was increasing from past few weeks.   Patient was requested to go to the ER-r patient decided to get this done as an outpatient. Patient did have Pleural tap done on March 25 as outpatient and tolerated the procedure well but thereafter patient came to the ER with shortness of breath/nausea vomiting.  Upon evaluation in the ER patient was found to be hypoxic with pulse ox at 86%.  Patient was thereafter started on nasal cannula Upon evaluation the ER patient had chest x-ray done, it showed d possible re-expansion pulmonary edema versus infiltration in left lower lobe.  Pt wa admitted to progressive bed as inpatient.  Nephrology was consulted for hyponatremia as patient had sodium 122. Patient seen today on second floor Patient main concern continues to be feeling weak Patient does complain of shortness of breath Patient also complained of nausea  Past Medical History:  Diagnosis Date   Hx of hysterectomy    Hyperlipidemia    Hypertension    Thyroid disease    rt thryroidectomy  09/25/2010        Family History  Problem Relation Age of Onset   Breast cancer Sister 48   Colon cancer Sister    Kidney cancer Sister     Social  History:  reports that she has never smoked. She has never used smokeless tobacco. She reports that she does not drink alcohol and does not use drugs.   Allergies:  Allergies  Allergen Reactions   Lisinopril Swelling    angioedema    Medications Prior to Admission  Medication Sig Dispense Refill   acetaminophen (TYLENOL) 500 MG tablet Take 500 mg by mouth every 6 (six) hours as needed for moderate pain, fever or headache.     allopurinol (ZYLOPRIM) 300 MG tablet Take 1 tablet (300 mg total) by mouth daily. 30 tablet 2   amLODipine (NORVASC) 10 MG tablet Take 1 tablet by mouth daily.     carvedilol (COREG) 25 MG tablet Take 1 tablet (25 mg total) by mouth 2 (two) times daily. 180 tablet 3   cholecalciferol (VITAMIN D3) 25 MCG (1000 UNIT) tablet Take 2,000 Units by mouth daily.     pravastatin (PRAVACHOL) 40 MG tablet Take 40 mg by mouth daily.     spironolactone (ALDACTONE) 50 MG tablet Take 50 mg by mouth daily.     SYNTHROID 50 MCG tablet Take 1 tablet (50 mcg total) by mouth daily. 30 tablet 3   loratadine (CLARITIN) 10 MG tablet Take 10 mg by mouth daily as needed for allergies.         QJF:HLKTG from the symptoms mentioned above,there are no other symptoms referable to all systems reviewed.   albuterol  2 puff Inhalation Q6H   allopurinol  300 mg Oral Daily   carvedilol  25 mg  Oral BID   cholecalciferol  2,000 Units Oral Daily   dextromethorphan-guaiFENesin  1 tablet Oral BID   ipratropium  2 puff Inhalation Q6H   levothyroxine  50 mcg Oral Q0600   pravastatin  40 mg Oral Daily       Physical Exam: Vital signs in last 24 hours: Temp:  [97.6 F (36.4 C)-99.6 F (37.6 C)] 97.9 F (36.6 C) (03/26 1120) Pulse Rate:  [96-124] 96 (03/26 1120) Resp:  [18-35] 18 (03/26 1120) BP: (107-149)/(52-85) 110/52 (03/26 1120) SpO2:  [86 %-98 %] 93 % (03/26 1120) Weight:  [76.4 kg-80 kg] 76.4 kg (03/26 1117) Weight change:  Last BM Date:  08/15/20  Intake/Output from previous day: 03/25 0701 - 03/26 0700 In: 350 [IV Piggyback:350] Out: 400 [Urine:400] Total I/O In: 240 [P.O.:240] Out: 250 [Urine:250]   Physical Exam:   General- pt is awake,alert, oriented to time place and person HEENT-head is atraumatic normocephalic, nasal cannula in situ Resp-moderate acute REsp distress, decreased breath sound at bases  CVS- S1S2 regular in rate and rhythm GIT- BS+, soft, NT, ND EXT- NO LE Edema, Cyanosis CNS- CN 2-12 grossly intact. Moving all 4 extremities Psych- normal mood and affect    Lab Results: CBC Recent Labs    08/15/20 1342 08/16/20 0739  WBC 16.9* 17.0*  HGB 13.1 11.4*  HCT 39.0 33.0*  PLT 44* 45*    BMET Recent Labs    08/15/20 2320 08/16/20 0739  NA 122* 124*  K 4.2 4.3  CL 88* 89*  CO2 22 23  GLUCOSE 119* 106*  BUN 23 19  CREATININE 0.90 0.81  CALCIUM 8.4* 8.5*   Sodium trend 2022  122=>124  132--134 as outpt   MICRO Recent Results (from the past 240 hour(s))  SARS CORONAVIRUS 2 (TAT 6-24 HRS) Nasopharyngeal Nasopharyngeal Swab     Status: None   Collection Time: 08/13/20 11:15 AM   Specimen: Nasopharyngeal Swab  Result Value Ref Range Status   SARS Coronavirus 2 NEGATIVE NEGATIVE Final    Comment: (NOTE) SARS-CoV-2 target nucleic acids are NOT DETECTED.  The SARS-CoV-2 RNA is generally detectable in upper and lower respiratory specimens during the acute phase of infection. Negative results do not preclude SARS-CoV-2 infection, do not rule out co-infections with other pathogens, and should not be used as the sole basis for treatment or other patient management decisions. Negative results must be combined with clinical observations, patient history, and epidemiological information. The expected result is Negative.  Fact Sheet for Patients: SugarRoll.be  Fact Sheet for Healthcare Providers: https://www.woods-mathews.com/  This  test is not yet approved or cleared by the [place] FDA and  has been authorized for detection and/or diagnosis of SARS-CoV-2 by FDA under an Emergency Use Authorization (EUA). This EUA will remain  in effect (meaning this test can be used) for the duration of the COVID-19 declaration under Se ction 564(b)(1) of the Act, 21 U.S.C. section 360bbb-3(b)(1), unless the authorization is terminated or revoked sooner.  Performed at [facility], [place] [address] [place], [place] [address]   Resp Panel by RT-PCR (Flu A&B, Covid) Nasopharyngeal Swab     Status: None   Collection Time: 08/15/20  3:42 PM   Specimen: Nasopharyngeal Swab; Nasopharyngeal(NP) swabs in vial transport medium  Result Value Ref Range Status   SARS Coronavirus 2 by RT PCR NEGATIVE NEGATIVE Final    Comment: (NOTE) SARS-CoV-2 target nucleic acids are NOT DETECTED.  The SARS-CoV-2 RNA is generally detectable in upper respiratory specimens  during the acute phase of infection. The lowest concentration of SARS-CoV-2 viral copies this assay can detect is 138 copies/mL. A negative result does not preclude SARS-Cov-2 infection and should not be used as the sole basis for treatment or other patient management decisions. A negative result may occur with  improper specimen collection/handling, submission of specimen other than nasopharyngeal swab, presence of viral mutation(s) within the areas targeted by this assay, and inadequate number of viral copies(<138 copies/mL). A negative result must be combined with clinical observations, patient history, and epidemiological information. The expected result is Negative.  Fact Sheet for Patients:  EntrepreneurPulse.com.au  Fact Sheet for Healthcare Providers:  IncredibleEmployment.be  This test is no t yet approved or cleared by the [place] FDA and  has been authorized for detection and/or diagnosis of SARS-CoV-2 by FDA under an  Emergency Use Authorization (EUA). This EUA will remain  in effect (meaning this test can be used) for the duration of the COVID-19 declaration under Section 564(b)(1) of the Act, 21 U.S.C.section 360bbb-3(b)(1), unless the authorization is terminated  or revoked sooner.       Influenza A by PCR NEGATIVE NEGATIVE Final   Influenza B by PCR NEGATIVE NEGATIVE Final    Comment: (NOTE) The Xpert Xpress SARS-CoV-2/FLU/RSV plus assay is intended as an aid in the diagnosis of influenza from Nasopharyngeal swab specimens and should not be used as a sole basis for treatment. Nasal washings and aspirates are unacceptable for Xpert Xpress SARS-CoV-2/FLU/RSV testing.  Fact Sheet for Patients: EntrepreneurPulse.com.au  Fact Sheet for Healthcare Providers: IncredibleEmployment.be  This test is not yet approved or cleared by the [place] FDA and has been authorized for detection and/or diagnosis of SARS-CoV-2 by FDA under an Emergency Use Authorization (EUA). This EUA will remain in effect (meaning this test can be used) for the duration of the COVID-19 declaration under Section 564(b)(1) of the Act, 21 U.S.C. section 360bbb-3(b)(1), unless the authorization is terminated or revoked.  Performed at [facility], [address] [place], [place] [address]   Culture, blood (Routine X 2) w Reflex to ID Panel     Status: None (Preliminary result)   Collection Time: 08/15/20  4:55 PM   Specimen: BLOOD  Result Value Ref Range Status   Specimen Description BLOOD BLOOD LEFT ARM  Final   Special Requests   Final    BOTTLES DRAWN AEROBIC AND ANAEROBIC Blood Culture adequate volume   Culture   Final    NO GROWTH < 12 HOURS Performed at [facility], [address], [place], [place] [address]    Report Status PENDING  Incomplete  Culture, blood (Routine X 2) w Reflex to ID Panel     Status: None (Preliminary result)   Collection Time:  08/15/20  5:16 PM   Specimen: BLOOD  Result Value Ref Range Status   Specimen Description BLOOD BLOOD LEFT HAND  Final   Special Requests   Final    BOTTLES DRAWN AEROBIC AND ANAEROBIC Blood Culture adequate volume   Culture   Final    NO GROWTH < 12 HOURS Performed at [facility], [address], [place], [place] [address]    Report Status PENDING  Incomplete      Lab Results  Component Value Date   CALCIUM 8.5 (L) 08/16/2020      Impression:  Natasha Farrell is a 75 y.o. female with medical history significant of hypertension, hyperlipidemia, hypothyroidism, gout, thrombocytopenia, extensive adenopathy, splenomegaly, pleural effusion, dCHF, who  presents with SOB. Patient is admitted with    Acute respiratory failure with hypoxia ([place])   Essential hypertension   Splenomegaly   Pleural effusion, left   Thrombocytopenia (HCC)   Hyperlipidemia   Chronic diastolic CHF (congestive heart failure) (HCC)   Hyponatremia   CAP (community acquired pneumonia)   Severe sepsis (HCC)  1) Renal GFR is stable  2)HTN Blood pressure is at goal   3)Anemia of chronic disease HGb at goal (9--11) No need for Epogen  4) hyponatremia Patient has chronic hyponatremia-patient had sodium low going back to February 28 lab. 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 cortisol and TSH We will decide about need for salt tablets after results No need for 3% saline at this time.   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     Grisela Mesch s Theador [place] 08/16/2020, 12:47 PM

## 2020-08-16 NOTE — Discharge Planning (Signed)
Patient has improved quickly!!  About 1000, OT assisted to chair in room and decided that was enough activity for a session.    About 1400, PT walked into room and noted patient was not on O2, but SPO2 was still low 90s.  Even with ambulating (around the nurses station), O2 still maintained in the low 90s.  Patient left off O2, since sats were stable.

## 2020-08-16 NOTE — Evaluation (Signed)
Physical Therapy Evaluation Patient Details Name: Natasha Farrell MRN: [id number] DOB: [birth year] Today's Date: 08/16/2020   History of Present Illness  75 y.o. female with medical history significant for hypertension, hyperlipidemia, hypothyroidism, gout, thrombocytopenia, extensive adenopathy, splenomegaly, pleural effusion, dCHF, who presents with SOB.  Had thorocentesis 3/25, >1.5L fluid.  Clinical Impression  Pt did surprisingly well given the amount of shortness of breath and O2 that she has had since thorocentesis.  On arrival pt on room air with sats maintaining in the low 90s, she reports generally feeling better (still much weaker than baseline) but was eager to attempt ambulation.  PT had O2 and chair follow, but ultimately she needed neither and was able to circumambulate the nurses station safely and confidently while maintaining O2 sats >88%.  She will not likely need PT once medically cleared to go home but is not at her baseline and we will maintain her on caseload to facilitate activity and work toward PLOF.     Follow Up Recommendations No PT follow up (will maintain on caseload while admitted)    Equipment Recommendations  None recommended by PT    Recommendations for Other Services       Precautions / Restrictions Precautions Precautions: Fall Restrictions Weight Bearing Restrictions: No      Mobility  Bed Mobility               General bed mobility comments: in recliner on arrival, returned to recliner post ambulation    Transfers Overall transfer level: Modified independent Equipment used: None Transfers: Sit to/from Stand Sit to Stand: Modified independent (Device/Increase time)         General transfer comment: Pt able to rise and maintain balance confidently and w/o heavy UE use  Ambulation/Gait Ambulation/Gait assistance: Modified independent (Device/Increase time) Gait Distance (Feet): 200 Feet Assistive device: None       General  Gait Details: Pt was able to circumambulate the nurses' station with consistent and confident cadence.  No LOBs and while on room air the entire time was able to keep O2 in the 90s for all but the final 20 ft with drop to 89%, no real shortness of breath or fatigue.  Stairs            Wheelchair Mobility    Modified Rankin (Stroke Patients Only)       Balance Overall balance assessment: Needs assistance Sitting-balance support: Feet supported Sitting balance-Leahy Scale: Good Sitting balance - Comments: no LOB   Standing balance support: During functional activity Standing balance-Leahy Scale: Good Standing balance comment: pt able to rise, ambulate w/o assist or overt safety concerns                             Pertinent Vitals/Pain Pain Assessment: No/denies pain    Home Living Family/patient expects to be discharged to:: Private residence Living Arrangements: Spouse/significant other Available Help at Discharge: Available 24 hours/day Type of Home: House Home Access: Stairs to enter Entrance Stairs-Rails: Psychiatric nurse of Steps: 3- 4 STE Home Layout: One level Home Equipment: Shower seat;Grab bars - tub/shower      Prior Function Level of Independence: Independent         Comments: Pt does not use AD or O2 at baseline, states she walks 3-4 miles multiple days/week     Hand Dominance        Extremity/Trunk Assessment   Upper Extremity Assessment Upper Extremity Assessment: Overall [facility]  for tasks assessed    Lower Extremity Assessment Lower Extremity Assessment: Overall WFL for tasks assessed       Communication   Communication: No difficulties  Cognition Arousal/Alertness: Awake/alert Behavior During Therapy: WFL for tasks assessed/performed Overall Cognitive Status: Within Functional Limits for tasks assessed                                        General Comments General comments (skin  integrity, edema, etc.): On arrival sats in the low 90s on room air (she reports [place] has been off for hours).  Pt was able to do prolonged bout of ambulation while on room air and maintain stats >88% w/o fatigue or shortness of breath.    Exercises     Assessment/Plan    PT Assessment Patient needs continued PT services  PT Problem List Decreased activity tolerance;Decreased safety awareness;Cardiopulmonary status limiting activity;Decreased balance       PT Treatment Interventions Gait training;Stair training;Functional mobility training;Therapeutic activities;Therapeutic exercise;Balance training;Patient/family education    PT Goals (Current goals can be found in the Care Plan section)  Acute Rehab PT Goals Patient Stated Goal: get better and go home PT Goal Formulation: With patient Time For Goal Achievement: 08/30/20 Potential to Achieve Goals: Good    Frequency Min 2X/week   Barriers to discharge        Co-evaluation               AM-PAC PT "6 Clicks" Mobility  Outcome Measure Help needed turning from your back to your side while in a flat bed without using bedrails?: None Help needed moving from lying on your back to sitting on the side of a flat bed without using bedrails?: None Help needed moving to and from a bed to a chair (including a wheelchair)?: None Help needed standing up from a chair using your arms (e.g., wheelchair or bedside chair)?: None Help needed to walk in hospital room?: None Help needed climbing 3-5 steps with a railing? : None 6 Click Score: 24    End of Session Equipment Utilized During Treatment: Gait belt Activity Tolerance: Patient tolerated treatment well Patient left: in chair;with call bell/phone within reach;with family/visitor present   PT Visit Diagnosis: Muscle weakness (generalized) (M62.81);Difficulty in walking, not elsewhere classified (R26.2)    Time: 9528-4132 PT Time Calculation (min) (ACUTE ONLY): 20 min   Charges:    PT Evaluation $PT Eval Low Complexity: 1 Low PT Treatments $Gait Training: 8-22 mins        Kreg Shropshire, DPT 08/16/2020, 4:00 PM

## 2020-08-16 NOTE — Progress Notes (Signed)
PROGRESS NOTE    Natasha Farrell  IRC:[id number] DOB: 1946-02-17 DOA: 08/15/2020 PCP: [name], MD   Brief Narrative: 75 year old with past medical history significant for hypertension, hyperlipidemia, hypothyroidism, gout, thrombocytopenia, extensive adenopathy and splenomegaly pleural effusion diastolic heart failure who presents complaining of shortness of breath.  Patient had left side thoracentesis and had 1.6 L of bloody pleural fluid removed by IR the morning of admission 3/25.  She develop shortness of breath, after the procedure which has been progressively worsening.  She also report her productive cough with yellow sputum. Patient was found to be in respiratory distress with oxygen saturation at 86 on room air which improved to 96% on 4 L. Patient reported that she was diagnosed with lymphoma by Dr. Mike Gip with plan to start treatment early next week.   Assessment & Plan:   Principal Problem:   Acute respiratory failure with hypoxia (HCC) Active Problems:   Essential hypertension   Splenomegaly   Pleural effusion, left   Thrombocytopenia (HCC)   Hyperlipidemia   Chronic diastolic CHF (congestive heart failure) (HCC)   Hyponatremia   CAP (community acquired pneumonia)   Severe sepsis (HCC)  1-Acute Hypoxic Respiratory failure with hypoxia: This could be related to expansion pulmonary edema after thoracentesis and or community-acquired pneumonia.  Patient presented with leukocytosis and tachycardia and tachypnea. -Continue with IV antibiotics to cover for pneumonia IV Rocephin and azithromycin -Continue with mucinex  Flutter valve -Continue with bronchodilators  2-Severe Sepsis secondary to community-acquired pneumonia: Patient presented with leukocytosis, tachycardia tachypnea.  Chest x-ray with findings consistent for pneumonia. Patient treated with IV antibiotics -Strep pneumonia negative.  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   4-Hyponatremia;  Sodium 88,, urine osmolarity 314. 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 Nephrology consulted, order TSH panel and cortisol level. Appreciate nephrology assistance. Plan to start patient on sodium tablet, water restrictions.   Hypertension: Holding spironolactone due to hyponatremia.  Continue with Coreg.  Hold amlodipine to avoid hypotension.  Left-sided pleural effusion: Status post thoracentesis 3/25. Pleural fluid analysis: LDH pleural 128, total protein 5.4, glucose 98.  Culture was not sent cytology pending We will check LDH serum  Thrombocytopenia: Follow trend Hyperlipidemia continue with pravastatin Chronic diastolic heart failure: Holding spironolactone due to sepsis and hyponatremia.  Estimated body mass index is 30.27 kg/m as calculated from the following:   Height as of this encounter: 5\' 4"  (1.626 m).   Weight as of this encounter: 80 kg.   DVT prophylaxis: SCD Due to thrombocytopenia Code Status: Full code Family Communication: Care discussed with patient Disposition Plan:  Status is: Inpatient  Remains inpatient appropriate because:IV treatments appropriate due to intensity of illness or inability to take PO   Dispo: The patient is from: Home              Anticipated d/c is to: Home              Patient currently is not medically stable to d/c.   Difficult to place patient No        Consultants:   Oncology  Nephrology  Procedures:   none  Antimicrobials:    Subjective: She is feeling a little bit better today shortness of breath has improved but still requiring 5 L of oxygen.  Reports productive cough.  Objective: Vitals:   08/16/20 0025 08/16/20 0425 08/16/20 0500 08/16/20 5102  BP:  (!) 143/56  132/68  Pulse:  (!) 114  (!) 108  Resp: 20 19 19 18   Temp:  99.6 F (37.6 C)  97.7 F (36.5  C)  TempSrc:  Oral    SpO2:  97%  98%  Weight:      Height:        Intake/Output Summary (Last 24 hours) at 08/16/2020 2353 Last data filed at 08/16/2020 0300 Gross per 24 hour  Intake 350 ml  Output 400 ml  Net -50 ml   Filed Weights   08/15/20 1335  Weight: 80 kg    Examination:  General exam: Appears calm and comfortable  Respiratory system: Normal respiratory effort, bilateral rhonchorous no wheezing Cardiovascular system: S1 & S2 heard, RRR. No JVD, murmurs, rubs, gallops or clicks. No pedal edema. Gastrointestinal system: Abdomen is nondistended, soft and nontender. Central nervous system: Alert and oriented. Extremities: Symmetric 5 x 5 power. Skin: No rashes, lesions or ulcers   Data Reviewed: I have personally reviewed following labs and imaging studies  CBC: Recent Labs  Lab 08/14/20 1519 08/15/20 1342  WBC 14.9* 16.9*  NEUTROABS 10.6* 11.7*  HGB 10.5* 13.1  HCT 30.3* 39.0  MCV 84.6 88.6  PLT 41* 44*   Basic Metabolic Panel: Recent Labs  Lab 08/14/20 1519 08/15/20 1448 08/15/20 1715 08/15/20 2320 08/16/20 0739  NA 122* 122* 122* 122* 124*  K 4.1 5.1 4.7 4.2 4.3  CL 89* 88* 87* 88* 89*  CO2 22 20* 20* 22 23  GLUCOSE 114* 149* 131* 119* 106*  BUN 11 18 19 23 19   CREATININE 0.68 0.84 0.82 0.90 0.81  CALCIUM 9.6 9.2 9.0 8.4* 8.5*   GFR: Estimated Creatinine Clearance: 64.2 mL/min (by C-G formula based on SCr of 0.81 mg/dL). Liver Function Tests: Recent Labs  Lab 08/15/20 1448  AST 31  ALT 14  ALKPHOS 92  BILITOT 1.7*  PROT 7.7  ALBUMIN 3.7   No results for input(s): LIPASE, AMYLASE in the last 168 hours. No results for input(s): AMMONIA in the last 168 hours. Coagulation Profile: No results for input(s): INR, PROTIME in the last 168 hours. Cardiac Enzymes: No results for input(s): CKTOTAL, CKMB, CKMBINDEX, TROPONINI in the last 168 hours. BNP (last 3 results) No results for input(s): PROBNP in the last 8760 hours. HbA1C: No  results for input(s): HGBA1C in the last 72 hours. CBG: Recent Labs  Lab 08/12/20 0757  GLUCAP 109*   Lipid Profile: No results for input(s): CHOL, HDL, LDLCALC, TRIG, CHOLHDL, LDLDIRECT in the last 72 hours. Thyroid Function Tests: No results for input(s): TSH, T4TOTAL, FREET4, T3FREE, THYROIDAB in the last 72 hours. Anemia Panel: No results for input(s): VITAMINB12, FOLATE, FERRITIN, TIBC, IRON, RETICCTPCT in the last 72 hours. Sepsis Labs: Recent Labs  Lab 08/15/20 1715 08/15/20 1826  PROCALCITON 0.81  --   LATICACIDVEN 2.8* 2.6*    Recent Results (from the past 240 hour(s))  SARS CORONAVIRUS 2 (TAT 6-24 HRS) Nasopharyngeal Nasopharyngeal Swab     Status: None   Collection Time: 08/13/20 11:15 AM   Specimen: Nasopharyngeal Swab  Result Value Ref Range Status   SARS Coronavirus 2 NEGATIVE NEGATIVE Final    Comment: (NOTE) SARS-CoV-2 target nucleic acids are NOT DETECTED.  The SARS-CoV-2 RNA is generally detectable in upper and lower respiratory specimens during the acute phase of infection. Negative results do not preclude SARS-CoV-2 infection, do not rule out co-infections with other pathogens, and should not be used as the sole basis for  treatment or other patient management decisions. Negative results must be combined with clinical observations, patient history, and epidemiological information. The expected result is Negative.  Fact Sheet for Patients: SugarRoll.be  Fact Sheet for Healthcare Providers: https://www.woods-mathews.com/  This test is not yet approved or cleared by the [place] FDA and  has been authorized for detection and/or diagnosis of SARS-CoV-2 by FDA under an Emergency Use Authorization (EUA). This EUA will remain  in effect (meaning this test can be used) for the duration of the COVID-19 declaration under Se ction 564(b)(1) of the Act, 21 U.S.C. section 360bbb-3(b)(1), unless the authorization is  terminated or revoked sooner.  Performed at [facility], [place] [address], [place], [place] [address]   Resp Panel by RT-PCR (Flu A&B, Covid) Nasopharyngeal Swab     Status: None   Collection Time: 08/15/20  3:42 PM   Specimen: Nasopharyngeal Swab; Nasopharyngeal(NP) swabs in vial transport medium  Result Value Ref Range Status   SARS Coronavirus 2 by RT PCR NEGATIVE NEGATIVE Final    Comment: (NOTE) SARS-CoV-2 target nucleic acids are NOT DETECTED.  The SARS-CoV-2 RNA is generally detectable in upper respiratory specimens during the acute phase of infection. The lowest concentration of SARS-CoV-2 viral copies this assay can detect is 138 copies/mL. A negative result does not preclude SARS-Cov-2 infection and should not be used as the sole basis for treatment or other patient management decisions. A negative result may occur with  improper specimen collection/handling, submission of specimen other than nasopharyngeal swab, presence of viral mutation(s) within the areas targeted by this assay, and inadequate number of viral copies(<138 copies/mL). A negative result must be combined with clinical observations, patient history, and epidemiological information. The expected result is Negative.  Fact Sheet for Patients:  EntrepreneurPulse.com.au  Fact Sheet for Healthcare Providers:  IncredibleEmployment.be  This test is no t yet approved or cleared by the [place] FDA and  has been authorized for detection and/or diagnosis of SARS-CoV-2 by FDA under an Emergency Use Authorization (EUA). This EUA will remain  in effect (meaning this test can be used) for the duration of the COVID-19 declaration under Section 564(b)(1) of the Act, 21 U.S.C.section 360bbb-3(b)(1), unless the authorization is terminated  or revoked sooner.       Influenza A by PCR NEGATIVE NEGATIVE Final   Influenza B by PCR NEGATIVE NEGATIVE Final    Comment:  (NOTE) The Xpert Xpress SARS-CoV-2/FLU/RSV plus assay is intended as an aid in the diagnosis of influenza from Nasopharyngeal swab specimens and should not be used as a sole basis for treatment. Nasal washings and aspirates are unacceptable for Xpert Xpress SARS-CoV-2/FLU/RSV testing.  Fact Sheet for Patients: EntrepreneurPulse.com.au  Fact Sheet for Healthcare Providers: IncredibleEmployment.be  This test is not yet approved or cleared by the [place] FDA and has been authorized for detection and/or diagnosis of SARS-CoV-2 by FDA under an Emergency Use Authorization (EUA). This EUA will remain in effect (meaning this test can be used) for the duration of the COVID-19 declaration under Section 564(b)(1) of the Act, 21 U.S.C. section 360bbb-3(b)(1), unless the authorization is terminated or revoked.  Performed at [facility], [address] [place], [place] [address]   Culture, blood (Routine X 2) w Reflex to ID Panel     Status: None (Preliminary result)   Collection Time: 08/15/20  4:55 PM   Specimen: BLOOD  Result Value Ref Range Status   Specimen Description BLOOD BLOOD LEFT ARM  Final   Special  Requests   Final    BOTTLES DRAWN AEROBIC AND ANAEROBIC Blood Culture adequate volume   Culture   Final    NO GROWTH < 12 HOURS Performed at [facility], [address] [place], [place] [address]    Report Status PENDING  Incomplete  Culture, blood (Routine X 2) w Reflex to ID Panel     Status: None (Preliminary result)   Collection Time: 08/15/20  5:16 PM   Specimen: BLOOD  Result Value Ref Range Status   Specimen Description BLOOD BLOOD LEFT HAND  Final   Special Requests   Final    BOTTLES DRAWN AEROBIC AND ANAEROBIC Blood Culture adequate volume   Culture   Final    NO GROWTH < 12 HOURS Performed at [facility], [address], [place],  [address]    Report Status PENDING  Incomplete          Radiology Studies: DG Chest Portable 1 View  Result Date: 08/15/2020 CLINICAL DATA:  Shortness of breath, thoracentesis earlier today, worsening since discharge by report. EXAM: PORTABLE CHEST 1 VIEW COMPARISON:  August 15, 2020 10:47 a.m. FINDINGS: Since the previous, very recent imaging study there is been interval development of confluent alveolar airspace disease in the LEFT lower lobe. The LEFT hemidiaphragm remains obscured, LEFT heart border now obscured when compared to the prior study. Blunting of LEFT costodiaphragmatic sulcus compatible with residual effusion is similar to the prior study. Blunting of RIGHT costodiaphragmatic sulcus is unchanged, subtle RIGHT lower lobe airspace disease. Trachea is midline. Cardiomediastinal contours are stable grossly though limited assessment due to the presence of airspace disease in the LEFT chest. No sign of pneumothorax. On limited assessment no acute skeletal process. EKG leads project over the chest. IMPRESSION: 1. Interval development of confluent alveolar airspace disease in the LEFT lower lobe concerning for re-expansion pulmonary edema versus pneumonia or aspiration. 2. No visible pneumothorax. 3. Small residual LEFT-sided effusion and small RIGHT-sided effusion. 4. Subtle opacity at the RIGHT lung base may represent atelectasis or developing pneumonia. Electronically Signed   By: Zetta Bills M.D.   On: 08/15/2020 14:27   DG Chest Port 1 View  Result Date: 08/15/2020 CLINICAL DATA:  Status post left thoracentesis EXAM: PORTABLE CHEST 1 VIEW COMPARISON:  08/01/2020 FINDINGS: Cardiomediastinal silhouette and pulmonary vasculature are within normal limits. No pneumothorax status post left thoracentesis. Significant interval reduction of left pleural effusion. Small bilateral pleural effusion still remain. IMPRESSION: No pneumothorax status post left thoracentesis. Electronically Signed   By: Miachel Roux M.D.   On: 08/15/2020 10:55   US  THORACENTESIS ASP PLEURAL SPACE W/IMG GUIDE  Result Date: 08/15/2020 INDICATION: Lymphoma. Shortness of breath. Large left pleural effusion. Request diagnostic therapeutic thoracentesis. EXAM: ULTRASOUND GUIDED LEFT THORACENTESIS MEDICATIONS: 1% plain lidocaine, 8 mL COMPLICATIONS: None immediate. PROCEDURE: An ultrasound guided thoracentesis was thoroughly discussed with the patient and questions answered. The benefits, risks, alternatives and complications were also discussed. The patient understands and wishes to proceed with the procedure. Written consent was obtained. Ultrasound was performed to localize and mark an adequate pocket of fluid in the left chest. The area was then prepped and draped in the normal sterile fashion. 1% Lidocaine was used for local anesthesia. Under ultrasound guidance a 6 Fr Safe-T-Centesis catheter was introduced. Thoracentesis was performed. The catheter was removed and a dressing applied. FINDINGS: A total of approximately 1.6 L of bloody pleural fluid was removed. Samples were sent to the laboratory as requested by the clinical  team. IMPRESSION: Successful ultrasound guided left thoracentesis yielding 1.6 L of pleural fluid. Read by: Ascencion Dike PA-C Electronically Signed   By: Miachel Roux M.D.   On: 08/15/2020 11:23        Scheduled Meds: . albuterol  2 puff Inhalation Q6H  . allopurinol  300 mg Oral Daily  . carvedilol  25 mg Oral BID  . cholecalciferol  2,000 Units Oral Daily  . dextromethorphan-guaiFENesin  1 tablet Oral BID  . ipratropium  2 puff Inhalation Q6H  . levothyroxine  50 mcg Oral Q0600  . pravastatin  40 mg Oral Daily   Continuous Infusions: . azithromycin 500 mg (08/15/20 2219)  . cefTRIAXone (ROCEPHIN)  IV Stopped (08/15/20 2001)     LOS: 1 day    Time spent: 35 minutes    Belkys A Regalado, MD [facility]   If 7PM-7AM, please contact night-coverage [email or web address]  08/16/2020, 8:33 AM

## 2020-08-16 NOTE — Evaluation (Signed)
Occupational Therapy Evaluation Patient Details Name: Natasha Farrell MRN: [id number] DOB: [birth year] Today's Date: 08/16/2020    History of Present Illness 75 y.o. female with medical history significant of hypertension, hyperlipidemia, hypothyroidism, gout, thrombocytopenia, extensive adenopathy, splenomegaly, pleural effusion, dCHF, who presents with SOB.   Clinical Impression    Patient presenting with decreased I in self care, balance, functional mobility/transfer, endurance, and safety awareness.  Patient reports living at home with husband independently PTA. She does not use AD for functional mobility and self care tasks. She reports husband does perform many of the IADL tasks or they do them together.  Patient currently functioning at min guard - min HHA. Pt standing at sink for several minutes for grooming tasks but needing to lean onto counter secondary to fatigue. Pt taking seated rest break before ambulating to recliner chair ~ 15' with min HHA. Pt remaining on 4 L O2 via [place] with vital signs WNLs. Pt would benefit from [facility] to address functional deficits and energy conservation at home.  Patient will benefit from acute OT to increase overall independence in the areas of ADLs, functional mobility, and safety awareness in order to safely discharge home with husband.  Follow Up Recommendations  Home health OT;Supervision - Intermittent    Equipment Recommendations  3 in 1 bedside commode       Precautions / Restrictions Precautions Precautions: Fall      Mobility Bed Mobility Overal bed mobility: Needs Assistance Bed Mobility: Supine to Sit     Supine to sit: Supervision;HOB elevated     General bed mobility comments: min cuing for technique    Transfers Overall transfer level: Needs assistance Equipment used: 1 person hand held assist Transfers: Sit to/from [facility] Sit to Stand: Min guard Stand pivot transfers: Min assist            Balance  Overall balance assessment: Needs assistance Sitting-balance support: Feet supported Sitting balance-Leahy Scale: Good Sitting balance - Comments: no LOB   Standing balance support: During functional activity Standing balance-Leahy Scale: Fair                             ADL either performed or assessed with clinical judgement   ADL Overall ADL's : Needs assistance/impaired Eating/Feeding: Modified independent   Grooming: Wash/dry hands;Wash/dry face;Oral care;Standing;Min Retail banker Details (indicate cue type and reason): simulated with HHA                 Vision Baseline Vision/History: No visual deficits Patient Visual Report: No change from baseline              Pertinent Vitals/Pain Pain Assessment: No/denies pain     Hand Dominance Right   Extremity/Trunk Assessment Upper Extremity Assessment Upper Extremity Assessment: Generalized weakness   Lower Extremity Assessment Lower Extremity Assessment: Generalized weakness       Communication Communication Communication: No difficulties   Cognition Arousal/Alertness: Awake/alert Behavior During Therapy: WFL for tasks assessed/performed Overall Cognitive Status: Within Functional Limits for tasks assessed                                                Home Living Family/patient expects  to be discharged to:: Private residence Living Arrangements: Spouse/significant other Available Help at Discharge: Available 24 hours/day Type of Home: House Home Access: Stairs to enter [employer] of Steps: 3- 4 STE Entrance Stairs-Rails: Right;Left Home Layout: One level     Bathroom Shower/Tub: Chief Strategy Officer: Shower seat;Grab bars - tub/shower          Prior Functioning/Environment Level of Independence: Independent        Comments: Pt does not use AD or O2 at  baseline        OT Problem List: Decreased strength;Impaired balance (sitting and/or standing);Decreased activity tolerance;Decreased knowledge of use of DME or AE;Increased edema;Cardiopulmonary status limiting activity;Decreased safety awareness      OT Treatment/Interventions: Self-care/ADL training;Therapeutic exercise;Energy conservation;DME and/or AE instruction;Cognitive remediation/compensation;Therapeutic activities;Balance training;Patient/family education;Manual therapy    OT Goals(Current goals can be found in the care plan section) Acute Rehab OT Goals Patient Stated Goal: to have more energy and strength OT Goal Formulation: With patient Time For Goal Achievement: 08/30/20 Potential to Achieve Goals: Good ADL Goals Pt Will Perform Grooming: with modified independence;standing Pt Will Transfer to Toilet: with modified independence;ambulating Pt Will Perform Toileting - Clothing Manipulation and hygiene: with modified independence;sit to/from stand Pt/caregiver will Perform Home Exercise Program: Increased strength;Both right and left upper extremity;With written HEP provided;Independently;With theraband  OT Frequency: Min 2X/week   Barriers to D/C:    none known at this time          AM-PAC OT "6 Clicks" Daily Activity     Outcome Measure Help from another person eating meals?: None Help from another person taking care of personal grooming?: None Help from another person toileting, which includes using toliet, bedpan, or urinal?: A Little Help from another person bathing (including washing, rinsing, drying)?: A Little Help from another person to put on and taking off regular upper body clothing?: None Help from another person to put on and taking off regular lower body clothing?: A Little 6 Click Score: 21   End of Session Equipment Utilized During Treatment: Oxygen (4L O2) Nurse Communication: Mobility status;Precautions  Activity Tolerance: Patient tolerated  treatment well Patient left: in chair;with chair alarm set;with call bell/phone within reach  OT Visit Diagnosis: Unsteadiness on feet (R26.81);Muscle weakness (generalized) (M62.81)                Time: 1000-1025 OT Time Calculation (min): 25 min Charges:  OT General Charges $OT Visit: 1 Visit OT Evaluation $OT Eval Low Complexity: 1 Low OT Treatments $Self Care/Home Management : 8-22 mins  Darleen Crocker, MS, OTR/L , CBIS ascom [phone]  08/16/20, 10:48 AM

## 2020-08-17 ENCOUNTER — Inpatient Hospital Stay: Payer: Medicare HMO

## 2020-08-17 DIAGNOSIS — J9601 Acute respiratory failure with hypoxia: Secondary | ICD-10-CM | POA: Diagnosis not present

## 2020-08-17 LAB — CBC
HCT: 28.4 % — ABNORMAL LOW (ref 36.0–46.0)
Hemoglobin: 10 g/dL — ABNORMAL LOW (ref 12.0–15.0)
MCH: 30.2 pg (ref 26.0–34.0)
MCHC: 35.2 g/dL (ref 30.0–36.0)
MCV: 85.8 fL (ref 80.0–100.0)
Platelets: 51 10*3/uL — ABNORMAL LOW (ref 150–400)
RBC: 3.31 MIL/uL — ABNORMAL LOW (ref 3.87–5.11)
RDW: 17.2 % — ABNORMAL HIGH (ref 11.5–15.5)
WBC: 13.4 10*3/uL — ABNORMAL HIGH (ref 4.0–10.5)
nRBC: 0 % (ref 0.0–0.2)

## 2020-08-17 LAB — BASIC METABOLIC PANEL
Anion gap: 10 (ref 5–15)
BUN: 16 mg/dL (ref 8–23)
CO2: 24 mmol/L (ref 22–32)
Calcium: 8.5 mg/dL — ABNORMAL LOW (ref 8.9–10.3)
Chloride: 92 mmol/L — ABNORMAL LOW (ref 98–111)
Creatinine, Ser: 0.71 mg/dL (ref 0.44–1.00)
GFR, Estimated: >60 mL/min (ref >60)
Glucose, Bld: 119 mg/dL — ABNORMAL HIGH (ref 70–99)
Potassium: 3.8 mmol/L (ref 3.5–5.1)
Sodium: 126 mmol/L — ABNORMAL LOW (ref 135–145)

## 2020-08-17 LAB — LACTATE DEHYDROGENASE: LDH: 182 U/L (ref 98–192)

## 2020-08-17 MED ORDER — ENSURE ENLIVE PO LIQD
237.0000 mL | Freq: Two times a day (BID) | ORAL | Status: DC
  Administered 2020-08-17 – 2020-08-20 (×7): 237 mL via ORAL

## 2020-08-17 MED ORDER — POTASSIUM CHLORIDE CRYS ER 20 MEQ PO TBCR
20.0000 meq | EXTENDED_RELEASE_TABLET | Freq: Once | ORAL | Status: AC
  Administered 2020-08-17: 20 meq via ORAL
  Filled 2020-08-17: qty 1

## 2020-08-17 MED ORDER — ADULT MULTIVITAMIN W/MINERALS CH
1.0000 | ORAL_TABLET | Freq: Every day | ORAL | Status: DC
  Administered 2020-08-18 – 2020-08-20 (×3): 1 via ORAL
  Filled 2020-08-17 (×3): qty 1

## 2020-08-17 MED ORDER — FUROSEMIDE 10 MG/ML IJ SOLN
40.0000 mg | Freq: Once | INTRAMUSCULAR | Status: AC
  Administered 2020-08-17: 40 mg via INTRAVENOUS
  Filled 2020-08-17: qty 4

## 2020-08-17 NOTE — Progress Notes (Signed)
Natasha Farrell  MRN: [id number]  DOB/AGE: December 23, 1945 75 y.o.  Primary Care Physician:McLaughlin, Wilhemena Durie, MD  Admit date: 08/15/2020  Chief Complaint:  Chief Complaint  Patient presents with  . Emesis    S-Pt presented on  08/15/2020 with  Chief Complaint  Patient presents with  . Emesis  .    Patient sitting up on the edge of the bed having her breakfast Patient voices no new physical complaints  Medications . albuterol  2 puff Inhalation Q6H  . allopurinol  300 mg Oral Daily  . carvedilol  25 mg Oral BID  . cholecalciferol  2,000 Units Oral Daily  . dextromethorphan-guaiFENesin  1 tablet Oral BID  . feeding supplement  237 mL Oral BID BM  . ipratropium  2 puff Inhalation Q6H  . levothyroxine  50 mcg Oral Q0600  . [START ON 08/18/2020] multivitamin with minerals  1 tablet Oral Daily  . pravastatin  40 mg Oral Daily  . sodium chloride  1 g Oral BID WC         ZMO:QHUTM from the symptoms mentioned above,there are no other symptoms referable to all systems reviewed.  Physical Exam: Vital signs in last 24 hours: Temp:  [97.6 F (36.4 C)-98.5 F (36.9 C)] 97.9 F (36.6 C) (03/27 1500) Pulse Rate:  [90-106] 106 (03/27 1500) Resp:  [18-24] 24 (03/27 1500) BP: (128-141)/(59-83) 138/71 (03/27 1500) SpO2:  [94 %-100 %] 99 % (03/27 1500) Weight:  [75.2 kg] 75.2 kg (03/27 1500) Weight change: -3.569 kg Last BM Date: 08/14/20  Intake/Output from previous day: 03/26 0701 - 03/27 0700 In: 720 [P.O.:720] Out: 250 [Urine:250] Total I/O In: 240 [P.O.:240] Out: 1650 [Urine:1650]   Physical Exam:  General- pt is awake,alert, oriented to time place and person  Resp- No acute REsp distress, CTA B/L NO Rhonchi  CVS- S1S2 regular in rate and rhythm  GIT- BS+, soft, Non tender , Non distended  EXT- No LE Edema,  No Cyanosis   Lab Results:  CBC  Recent Labs    08/16/20 0739 08/17/20 0812  WBC 17.0* 13.4*  HGB 11.4* 10.0*  HCT 33.0* 28.4*  PLT 45* 51*     BMET  Recent Labs    08/16/20 0739 08/17/20 0812  NA 124* 126*  K 4.3 3.8  CL 89* 92*  CO2 23 24  GLUCOSE 106* 119*  BUN 19 16  CREATININE 0.81 0.71  CALCIUM 8.5* 8.5*      Most recent Creatinine trend  Lab Results  Component Value Date   CREATININE 0.71 08/17/2020   CREATININE 0.81 08/16/2020   CREATININE 0.90 08/15/2020      MICRO   Recent Results (from the past 240 hour(s))  SARS CORONAVIRUS 2 (TAT 6-24 HRS) Nasopharyngeal Nasopharyngeal Swab     Status: None   Collection Time: 08/13/20 11:15 AM   Specimen: Nasopharyngeal Swab  Result Value Ref Range Status   SARS Coronavirus 2 NEGATIVE NEGATIVE Final    Comment: (NOTE) SARS-CoV-2 target nucleic acids are NOT DETECTED.  The SARS-CoV-2 RNA is generally detectable in upper and lower respiratory specimens during the acute phase of infection. Negative results do not preclude SARS-CoV-2 infection, do not rule out co-infections with other pathogens, and should not be used as the sole basis for treatment or other patient management decisions. Negative results must be combined with clinical observations, patient history, and epidemiological information. The expected result is Negative.  Fact Sheet for Patients: SugarRoll.be  Fact Sheet for Healthcare Providers: https://www.woods-mathews.com/  This test is not yet approved or cleared by the [place] and  has been authorized for detection and/or diagnosis of SARS-CoV-2 by FDA under an Emergency Use Authorization (EUA). This EUA will remain  in effect (meaning this test can be used) for the duration of the COVID-19 declaration under Se ction 564(b)(1) of the Act, 21 U.S.C. section 360bbb-3(b)(1), unless the authorization is terminated or revoked sooner.  Performed at [facility], [place] [address], [place], [place] [address]   Body fluid culture w Gram Stain     Status: None (Preliminary result)    Collection Time: 08/15/20 10:30 AM   Specimen: Pleura; Body Fluid  Result Value Ref Range Status   Specimen Description   Final    PLEURAL Performed at [facility], [address] [place], [place] [address]    Special Requests   Final    PLEURAL Performed at [facility], [address] [place], [place] [address]    Gram Stain   Final    FEW WBC PRESENT, PREDOMINANTLY MONONUCLEAR NO ORGANISMS SEEN    Culture   Final    NO GROWTH < 12 HOURS Performed at [facility] [address] [place], [place] [address]    Report Status PENDING  Incomplete  Resp Panel by RT-PCR (Flu A&B, Covid) Nasopharyngeal Swab     Status: None   Collection Time: 08/15/20  3:42 PM   Specimen: Nasopharyngeal Swab; Nasopharyngeal(NP) swabs in vial transport medium  Result Value Ref Range Status   SARS Coronavirus 2 by RT PCR NEGATIVE NEGATIVE Final    Comment: (NOTE) SARS-CoV-2 target nucleic acids are NOT DETECTED.  The SARS-CoV-2 RNA is generally detectable in upper respiratory specimens during the acute phase of infection. The lowest concentration of SARS-CoV-2 viral copies this assay can detect is 138 copies/mL. A negative result does not preclude SARS-Cov-2 infection and should not be used as the sole basis for treatment or other patient management decisions. A negative result may occur with  improper specimen collection/handling, submission of specimen other than nasopharyngeal swab, presence of viral mutation(s) within the areas targeted by this assay, and inadequate number of viral copies(<138 copies/mL). A negative result must be combined with clinical observations, patient history, and epidemiological information. The expected result is Negative.  Fact Sheet for Patients:  EntrepreneurPulse.com.au  Fact Sheet for Healthcare Providers:  IncredibleEmployment.be  This test is no t yet approved or cleared by the [place]  FDA and  has been authorized for detection and/or diagnosis of SARS-CoV-2 by FDA under an Emergency Use Authorization (EUA). This EUA will remain  in effect (meaning this test can be used) for the duration of the COVID-19 declaration under Section 564(b)(1) of the Act, 21 U.S.C.section 360bbb-3(b)(1), unless the authorization is terminated  or revoked sooner.       Influenza A by PCR NEGATIVE NEGATIVE Final   Influenza B by PCR NEGATIVE NEGATIVE Final    Comment: (NOTE) The Xpert Xpress SARS-CoV-2/FLU/RSV plus assay is intended as an aid in the diagnosis of influenza from Nasopharyngeal swab specimens and should not be used as a sole basis for treatment. Nasal washings and aspirates are unacceptable for Xpert Xpress SARS-CoV-2/FLU/RSV testing.  Fact Sheet for Patients: EntrepreneurPulse.com.au  Fact Sheet for Healthcare Providers: IncredibleEmployment.be  This test is not yet approved or cleared by the [place] FDA and has been authorized for detection and/or diagnosis of SARS-CoV-2 by FDA under an Emergency Use Authorization (EUA). This EUA  will remain in effect (meaning this test can be used) for the duration of the COVID-19 declaration under Section 564(b)(1) of the Act, 21 U.S.C. section 360bbb-3(b)(1), unless the authorization is terminated or revoked.  Performed at [facility], [address] [place], [place] [address]   Culture, blood (Routine X 2) w Reflex to ID Panel     Status: None (Preliminary result)   Collection Time: 08/15/20  4:55 PM   Specimen: BLOOD  Result Value Ref Range Status   Specimen Description BLOOD BLOOD LEFT ARM  Final   Special Requests   Final    BOTTLES DRAWN AEROBIC AND ANAEROBIC Blood Culture adequate volume   Culture   Final    NO GROWTH 2 DAYS Performed at [facility], [address], [place], [place] [address]    Report Status PENDING  Incomplete  Culture, blood (Routine  X 2) w Reflex to ID Panel     Status: None (Preliminary result)   Collection Time: 08/15/20  5:16 PM   Specimen: BLOOD  Result Value Ref Range Status   Specimen Description BLOOD BLOOD LEFT HAND  Final   Special Requests   Final    BOTTLES DRAWN AEROBIC AND ANAEROBIC Blood Culture adequate volume   Culture   Final    NO GROWTH 2 DAYS Performed at [facility], [address] [place], [place] [address]    Report Status PENDING  Incomplete         Impression:  Natasha Farrell a 75 y.o.femalewith medical history significant ofhypertension, hyperlipidemia, hypothyroidism, gout, thrombocytopenia, extensive adenopathy, splenomegaly, pleural effusion,dCHF, who presents with SOB. Patient is admitted with  Acute respiratory failure with hypoxia ([place]) Essential hypertension Splenomegaly Pleural effusion, left Thrombocytopenia (HCC) Hyperlipidemia Chronic diastolic CHF (congestive heart failure) (HCC) Hyponatremia CAP (community acquired pneumonia) Severe sepsis (HCC)  1) Renal GFR is stable  2)HTN Blood pressure is at goal    3)Anemia of chronic disease  CBC Latest Ref Rng & Units 08/16/2020 08/15/2020 08/14/2020  WBC 4.0 - 10.5 K/uL 17.0(H) 16.9(H) 14.9(H)  Hemoglobin 12.0 - 15.0 g/dL 11.4(L) 13.1 10.5(L)  Hematocrit 36.0 - 46.0 % 33.0(L) 39.0 30.3(L)  Platelets 150 - 400 K/uL 45(L) 44(L) 41(L)     4) hyponatremia Patient has chronic hyponatremia-patient had sodium low going back to February 28 lab. 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 cortisol level within normal limits -Patient serum TSH will also stable Patient urine osmolality is higher than the serum osmolality Starting patient on salt tablets has helped Patient sodium is improving slowly     5) sepsis PMD following  6) chronic diastolic CHF Patient is euvolemic Data in favor as  patient has BNP of 22.8    7)Acid base  Co2 at goal     Plan:  We will continue current treatment We will continue to follow sodium      Kilyn Maragh s [facility] 08/17/2020, 6:18 PM

## 2020-08-17 NOTE — Progress Notes (Signed)
Initial Nutrition Assessment  DOCUMENTATION CODES:   Not applicable  INTERVENTION:   Ensure Enlive po BID, each supplement provides 350 kcal and 20 grams of protein  MVI po daily   Pt at high refeed risk; recommend monitor potassium, magnesium and phosphorus labs daily until stable  NUTRITION DIAGNOSIS:   Inadequate oral intake related to acute illness as evidenced by meal completion < 25%.  GOAL:   Patient will meet greater than or equal to 90% of their needs  MONITOR:   PO intake,Supplement acceptance,Labs,Weight trends,Skin,I & O's  REASON FOR ASSESSMENT:   Malnutrition Screening Tool    ASSESSMENT:   75 y.o. female with medical history significant of hypertension, hyperlipidemia, hypothyroidism, gout, thrombocytopenia, extensive adenopathy, splenomegaly, pleural effusion and dCHF who presents with SOB and was found to have CAP and possible malignancy.   Pt /sp thoracentesis 3/25 with 1.6L output  RD working remotely.  Spoke with pt via phone. Pt reports decreased appetite and oral intake for several weeks pta r/t early satiety. Pt reports that because her spleen and lymph nodes are enlarged that they push on her stomach and she gets full really easily. Pt reports that she has been drinking some vanilla Boost at home. RD discussed with pt the importance of adequate nutrition needed to preserve lean muscle. Pt is willing to drink chocolate Ensure in hospital. RD will add supplements and MVI to help pt meet her estimated needs. Pt currently eating only sips and bites of meals in hospital. Per chart, pt is down 13lbs(7%) over the past 2 months; pt reports that she is aware of this weight loss.   Medications reviewed and include: allopurinol, D3, synthroid, NaCl, azithromycin, ceftriaxone   Labs reviewed: Na 126(L) Wbc- 13.4(H), Hgb 10.0(L), Hct 28.4(L)  NUTRITION - FOCUSED PHYSICAL EXAM: Unable to perform at this time   Diet Order:   Diet Order            Diet  regular Room service appropriate? Yes; Fluid consistency: Thin; Fluid restriction: Other (see comments)  Diet effective now                EDUCATION NEEDS:   Education needs have been addressed  Skin:  Skin Assessment: Reviewed RN Assessment (ecchymosis, incision L axilla)  Last BM:  3/25  Height:   Ht Readings from Last 1 Encounters:  08/15/20 5\' 4"  (1.626 m)    Weight:   Wt Readings from Last 1 Encounters:  08/16/20 76.4 kg    BMI:  Body mass index is 28.92 kg/m.  Estimated Nutritional Needs:   Kcal:  1700-1900kcal/day  Protein:  85-95g/day  Fluid:  1.6L/day  Koleen Distance MS, RD, LDN Please refer to [facility] for RD and/or RD on-call/weekend/after hours pager

## 2020-08-17 NOTE — Progress Notes (Signed)
PROGRESS NOTE    Natasha Farrell  QZE:[id number] DOB: [birth year] DOA: 08/15/2020 PCP: [name], MD   Brief Narrative: 75 year old with past medical history significant for hypertension, hyperlipidemia, hypothyroidism, gout, thrombocytopenia, extensive adenopathy and splenomegaly pleural effusion diastolic heart failure who presents complaining of shortness of breath.  Patient had left side thoracentesis and had 1.6 L of bloody pleural fluid removed by IR the morning of admission 3/25.  She develop shortness of breath, after the procedure which has been progressively worsening.  She also report her productive cough with yellow sputum. Patient was found to be in respiratory distress with oxygen saturation at 86 on room air which improved to 96% on 4 L. Patient reported that she was diagnosed with lymphoma by Dr. Mike Gip with plan to start treatment early next week.   Assessment & Plan:   Principal Problem:   Acute respiratory failure with hypoxia (HCC) Active Problems:   Essential hypertension   Splenomegaly   Pleural effusion, left   Thrombocytopenia (HCC)   Hyperlipidemia   Chronic diastolic CHF (congestive heart failure) (HCC)   Hyponatremia   CAP (community acquired pneumonia)   Severe sepsis (HCC)  1-Acute Hypoxic Respiratory failure with hypoxia: This could be related to expansion pulmonary edema after thoracentesis and or community-acquired pneumonia.  Patient presented with leukocytosis and tachycardia and tachypnea. -Continue with IV antibiotics to cover for pneumonia IV Rocephin and azithromycin -Continue with mucinex,  Flutter valve -Continue with bronchodilators -Still with SOB, Chest x ray today showed new pulmonary vascular congestion, slightly improved left mid to lower lung airspace disease. -Plan to give 40 mg of IV Lasix x1  2-Severe Sepsis secondary to community-acquired pneumonia: Patient presented with leukocytosis, tachycardia tachypnea.  Chest  x-ray with findings consistent for pneumonia. Patient treated with IV antibiotics -Strep pneumonia negative.  Legionella knee pneumonia pending -Procalcitonin 0.8, lactic acid 2.8 -Blood cultures no growth to date. 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   4-Hyponatremia; likely secondary to SIADH Sodium 88,, urine osmolarity 314. 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 Nephrology consulted, order TSH panel and cortisol level not low.  Appreciate nephrology assistance. Patient was a started on sodium tablet, water restrictions.  Sodium improved to 126.  Will give IV Laxis for pulmonary edema  Hypertension: Holding spironolactone due to hyponatremia.  Continue with Coreg.  Hold amlodipine to avoid hypotension.  Left-sided pleural effusion: Status post thoracentesis 3/25. Exudative effusion.  Pleural fluid analysis: LDH pleural 128, total protein 5.4, glucose 98.   LDH serum; 182 Lights criteria consistent with Exudative effusion, Pleural fluid Culture no growth to date.  Continue with IV antibiotics.  Needs to follow Cytology;   Thrombocytopenia: Follow trend, improving today.  Hyperlipidemia continue with pravastatin Chronic diastolic heart failure: Holding spironolactone due to sepsis and hyponatremia.  Estimated body mass index is 28.92 kg/m as calculated from the following:   Height as of this encounter: 5\' 4"  (1.626 m).   Weight as of this encounter: 76.4 kg.   DVT prophylaxis: SCD Due to thrombocytopenia Code Status: Full code Family Communication: Care discussed with patient Disposition Plan:  Status is: Inpatient  Remains inpatient appropriate because:IV treatments appropriate due to intensity of illness or inability to take PO   Dispo: The patient is from: Home              Anticipated d/c is to: Home  Patient currently is not medically  stable to d/c.   Difficult to place patient No        Consultants:   Oncology  Nephrology  Procedures:   none  Antimicrobials:    Subjective: She is still having productive cough, feels SOB today.  Denies chest pain.  She is feeling weak and tire  Objective: Vitals:   08/17/20 0402 08/17/20 0456 08/17/20 0721 08/17/20 0900  BP: 136/67 130/83 128/69 (!) 133/59  Pulse: 90 91 91 95  Resp: 20 20 (!) 22 20  Temp: 97.6 F (36.4 C) 98.5 F (36.9 C) 97.6 F (36.4 C)   TempSrc: Oral Oral Oral   SpO2: 95% 96% 99% 100%  Weight:      Height:        Intake/Output Summary (Last 24 hours) at 08/17/2020 1434 Last data filed at 08/17/2020 1345 Gross per 24 hour  Intake 480 ml  Output -  Net 480 ml   Filed Weights   08/15/20 1335 08/16/20 1117  Weight: 80 kg 76.4 kg    Examination:  General exam: NAD Respiratory system: BL crackles Cardiovascular system: S 1, S 2 RRR. Gastrointestinal system: BS present, soft, nt, nd Central nervous system: Alert Extremities: Symmetric power    Data Reviewed: I have personally reviewed following labs and imaging studies  CBC: Recent Labs  Lab 08/14/20 1519 08/15/20 1342 08/16/20 0739 08/17/20 0812  WBC 14.9* 16.9* 17.0* 13.4*  NEUTROABS 10.6* 11.7*  --   --   HGB 10.5* 13.1 11.4* 10.0*  HCT 30.3* 39.0 33.0* 28.4*  MCV 84.6 88.6 87.5 85.8  PLT 41* 44* 45* 51*   Basic Metabolic Panel: Recent Labs  Lab 08/15/20 1448 08/15/20 1715 08/15/20 2320 08/16/20 0739 08/17/20 0812  NA 122* 122* 122* 124* 126*  K 5.1 4.7 4.2 4.3 3.8  CL 88* 87* 88* 89* 92*  CO2 20* 20* 22 23 24   GLUCOSE 149* 131* 119* 106* 119*  BUN 18 19 23 19 16   CREATININE 0.84 0.82 0.90 0.81 0.71  CALCIUM 9.2 9.0 8.4* 8.5* 8.5*   GFR: Estimated Creatinine Clearance: 63.6 mL/min (by C-G formula based on SCr of 0.71 mg/dL). Liver Function Tests: Recent Labs  Lab 08/15/20 1448  AST 31  ALT 14  ALKPHOS 92  BILITOT 1.7*  PROT 7.7  ALBUMIN 3.7    No results for input(s): LIPASE, AMYLASE in the last 168 hours. No results for input(s): AMMONIA in the last 168 hours. Coagulation Profile: No results for input(s): INR, PROTIME in the last 168 hours. Cardiac Enzymes: No results for input(s): CKTOTAL, CKMB, CKMBINDEX, TROPONINI in the last 168 hours. BNP (last 3 results) No results for input(s): PROBNP in the last 8760 hours. HbA1C: No results for input(s): HGBA1C in the last 72 hours. CBG: Recent Labs  Lab 08/12/20 0757  GLUCAP 109*   Lipid Profile: No results for input(s): CHOL, HDL, LDLCALC, TRIG, CHOLHDL, LDLDIRECT in the last 72 hours. Thyroid Function Tests: No results for input(s): TSH, T4TOTAL, FREET4, T3FREE, THYROIDAB in the last 72 hours. Anemia Panel: No results for input(s): VITAMINB12, FOLATE, FERRITIN, TIBC, IRON, RETICCTPCT in the last 72 hours. Sepsis Labs: Recent Labs  Lab 08/15/20 1715 08/15/20 1826  PROCALCITON 0.81  --   LATICACIDVEN 2.8* 2.6*    Recent Results (from the past 240 hour(s))  SARS CORONAVIRUS 2 (TAT 6-24 HRS) Nasopharyngeal Nasopharyngeal Swab     Status: None   Collection Time: 08/13/20 11:15 AM   Specimen: Nasopharyngeal Swab  Result  Value Ref Range Status   SARS Coronavirus 2 NEGATIVE NEGATIVE Final    Comment: (NOTE) SARS-CoV-2 target nucleic acids are NOT DETECTED.  The SARS-CoV-2 RNA is generally detectable in upper and lower respiratory specimens during the acute phase of infection. Negative results do not preclude SARS-CoV-2 infection, do not rule out co-infections with other pathogens, and should not be used as the sole basis for treatment or other patient management decisions. Negative results must be combined with clinical observations, patient history, and epidemiological information. The expected result is Negative.  Fact Sheet for Patients: SugarRoll.be  Fact Sheet for Healthcare  Providers: https://www.woods-mathews.com/  This test is not yet approved or cleared by the [place] FDA and  has been authorized for detection and/or diagnosis of SARS-CoV-2 by FDA under an Emergency Use Authorization (EUA). This EUA will remain  in effect (meaning this test can be used) for the duration of the COVID-19 declaration under Se ction 564(b)(1) of the Act, 21 U.S.C. section 360bbb-3(b)(1), unless the authorization is terminated or revoked sooner.  Performed at [facility], [place] [address] [place], [place] [address]   Body fluid culture w Gram Stain     Status: None (Preliminary result)   Collection Time: 08/15/20 10:30 AM   Specimen: Pleura; Body Fluid  Result Value Ref Range Status   Specimen Description   Final    PLEURAL Performed at [facility], [address] [place], [place] [address]    Special Requests   Final    PLEURAL Performed at [facility], [address] [place], [place] [address]    Gram Stain   Final    FEW WBC PRESENT, PREDOMINANTLY MONONUCLEAR NO ORGANISMS SEEN    Culture   Final    NO GROWTH < 12 HOURS Performed at [facility] [address], [place],  [address]    Report Status PENDING  Incomplete  Resp Panel by RT-PCR (Flu A&B, Covid) Nasopharyngeal Swab     Status: None   Collection Time: 08/15/20  3:42 PM   Specimen: Nasopharyngeal Swab; Nasopharyngeal(NP) swabs in vial transport medium  Result Value Ref Range Status   SARS Coronavirus 2 by RT PCR NEGATIVE NEGATIVE Final    Comment: (NOTE) SARS-CoV-2 target nucleic acids are NOT DETECTED.  The SARS-CoV-2 RNA is generally detectable in upper respiratory specimens during the acute phase of infection. The lowest concentration of SARS-CoV-2 viral copies this assay can detect is 138 copies/mL. A negative result does not preclude SARS-Cov-2 infection and should not be used as the sole basis for treatment or other patient  management decisions. A negative result may occur with  improper specimen collection/handling, submission of specimen other than nasopharyngeal swab, presence of viral mutation(s) within the areas targeted by this assay, and inadequate number of viral copies(<138 copies/mL). A negative result must be combined with clinical observations, patient history, and epidemiological information. The expected result is Negative.  Fact Sheet for Patients:  EntrepreneurPulse.com.au  Fact Sheet for Healthcare Providers:  IncredibleEmployment.be  This test is no t yet approved or cleared by the [place] FDA and  has been authorized for detection and/or diagnosis of SARS-CoV-2 by FDA under an Emergency Use Authorization (EUA). This EUA will remain  in effect (meaning this test can be used) for the duration of the COVID-19 declaration under Section 564(b)(1) of the Act, 21 U.S.C.section 360bbb-3(b)(1), unless the authorization is terminated  or revoked sooner.       Influenza A by PCR  NEGATIVE NEGATIVE Final   Influenza B by PCR NEGATIVE NEGATIVE Final    Comment: (NOTE) The Xpert Xpress SARS-CoV-2/FLU/RSV plus assay is intended as an aid in the diagnosis of influenza from Nasopharyngeal swab specimens and should not be used as a sole basis for treatment. Nasal washings and aspirates are unacceptable for Xpert Xpress SARS-CoV-2/FLU/RSV testing.  Fact Sheet for Patients: EntrepreneurPulse.com.au  Fact Sheet for Healthcare Providers: IncredibleEmployment.be  This test is not yet approved or cleared by the [place] FDA and has been authorized for detection and/or diagnosis of SARS-CoV-2 by FDA under an Emergency Use Authorization (EUA). This EUA will remain in effect (meaning this test can be used) for the duration of the COVID-19 declaration under Section 564(b)(1) of the Act, 21 U.S.C. section 360bbb-3(b)(1),  unless the authorization is terminated or revoked.  Performed at [facility], [address] [place], [place] [address]   Culture, blood (Routine X 2) w Reflex to ID Panel     Status: None (Preliminary result)   Collection Time: 08/15/20  4:55 PM   Specimen: BLOOD  Result Value Ref Range Status   Specimen Description BLOOD BLOOD LEFT ARM  Final   Special Requests   Final    BOTTLES DRAWN AEROBIC AND ANAEROBIC Blood Culture adequate volume   Culture   Final    NO GROWTH 2 DAYS Performed at [facility], [address] [place], [place] [address]    Report Status PENDING  Incomplete  Culture, blood (Routine X 2) w Reflex to ID Panel     Status: None (Preliminary result)   Collection Time: 08/15/20  5:16 PM   Specimen: BLOOD  Result Value Ref Range Status   Specimen Description BLOOD BLOOD LEFT HAND  Final   Special Requests   Final    BOTTLES DRAWN AEROBIC AND ANAEROBIC Blood Culture adequate volume   Culture   Final    NO GROWTH 2 DAYS Performed at [facility], [address] [place], [place] [address]    Report Status PENDING  Incomplete         Radiology Studies: DG Chest 1 View  Result Date: 08/17/2020 CLINICAL DATA:  Acute respiratory failure EXAM: CHEST  1 VIEW COMPARISON:  08/15/2020 FINDINGS: The cardiomediastinal silhouette is unchanged. Airspace disease/consolidation within the LEFT mid-lower lung is slightly improved. Trace bilateral pleural effusions are now noted. New pulmonary vascular congestion is present. There is no evidence of pneumothorax. IMPRESSION: Slightly improved LEFT mid-lower lung airspace disease/consolidation with new pulmonary vascular congestion and trace bilateral pleural effusions. Electronically Signed   By: Margarette Canada M.D.   On: 08/17/2020 11:19        Scheduled Meds: . albuterol  2 puff Inhalation Q6H  . allopurinol  300 mg Oral Daily  . carvedilol  25 mg Oral BID  . cholecalciferol  2,000 Units  Oral Daily  . dextromethorphan-guaiFENesin  1 tablet Oral BID  . feeding supplement  237 mL Oral BID BM  . furosemide  40 mg Intravenous Once  . ipratropium  2 puff Inhalation Q6H  . levothyroxine  50 mcg Oral Q0600  . [START ON 08/18/2020] multivitamin with minerals  1 tablet Oral Daily  . potassium chloride  20 mEq Oral Once  . pravastatin  40 mg Oral Daily  . sodium chloride  1 g Oral BID WC   Continuous Infusions: . azithromycin 500 mg (08/16/20 1719)  . cefTRIAXone (ROCEPHIN)  IV 1 g (08/16/20 1626)     LOS:  2 days    Time spent: 35 minutes    Elmarie Shiley, MD [facility]   If 7PM-7AM, please contact night-coverage [email or web address]  08/17/2020, 2:34 PM

## 2020-08-18 ENCOUNTER — Inpatient Hospital Stay: Payer: Medicare HMO

## 2020-08-18 DIAGNOSIS — J90 Pleural effusion, not elsewhere classified: Secondary | ICD-10-CM

## 2020-08-18 DIAGNOSIS — R599 Enlarged lymph nodes, unspecified: Secondary | ICD-10-CM

## 2020-08-18 DIAGNOSIS — J9601 Acute respiratory failure with hypoxia: Secondary | ICD-10-CM

## 2020-08-18 DIAGNOSIS — R0602 Shortness of breath: Secondary | ICD-10-CM

## 2020-08-18 DIAGNOSIS — E222 Syndrome of inappropriate secretion of antidiuretic hormone: Secondary | ICD-10-CM

## 2020-08-18 DIAGNOSIS — R5383 Other fatigue: Secondary | ICD-10-CM

## 2020-08-18 DIAGNOSIS — K921 Melena: Secondary | ICD-10-CM

## 2020-08-18 DIAGNOSIS — E871 Hypo-osmolality and hyponatremia: Secondary | ICD-10-CM

## 2020-08-18 DIAGNOSIS — C8330 Diffuse large B-cell lymphoma, unspecified site: Secondary | ICD-10-CM | POA: Diagnosis not present

## 2020-08-18 DIAGNOSIS — D649 Anemia, unspecified: Secondary | ICD-10-CM

## 2020-08-18 DIAGNOSIS — E079 Disorder of thyroid, unspecified: Secondary | ICD-10-CM

## 2020-08-18 DIAGNOSIS — J189 Pneumonia, unspecified organism: Secondary | ICD-10-CM | POA: Diagnosis not present

## 2020-08-18 DIAGNOSIS — R198 Other specified symptoms and signs involving the digestive system and abdomen: Secondary | ICD-10-CM

## 2020-08-18 LAB — BASIC METABOLIC PANEL
Anion gap: 11 (ref 5–15)
BUN: 16 mg/dL (ref 8–23)
CO2: 25 mmol/L (ref 22–32)
Calcium: 9.2 mg/dL (ref 8.9–10.3)
Chloride: 94 mmol/L — ABNORMAL LOW (ref 98–111)
Creatinine, Ser: 0.8 mg/dL (ref 0.44–1.00)
GFR, Estimated: >60 mL/min (ref >60)
Glucose, Bld: 115 mg/dL — ABNORMAL HIGH (ref 70–99)
Potassium: 4.2 mmol/L (ref 3.5–5.1)
Sodium: 130 mmol/L — ABNORMAL LOW (ref 135–145)

## 2020-08-18 LAB — COMP PANEL: LEUKEMIA/LYMPHOMA

## 2020-08-18 LAB — LEGIONELLA PNEUMOPHILA SEROGP 1 UR AG: L. pneumophila Serogp 1 Ur Ag: NEGATIVE

## 2020-08-18 LAB — CBC
HCT: 30 % — ABNORMAL LOW (ref 36.0–46.0)
Hemoglobin: 10.1 g/dL — ABNORMAL LOW (ref 12.0–15.0)
MCH: 29.8 pg (ref 26.0–34.0)
MCHC: 33.7 g/dL (ref 30.0–36.0)
MCV: 88.5 fL (ref 80.0–100.0)
Platelets: 62 10*3/uL — ABNORMAL LOW (ref 150–400)
RBC: 3.39 MIL/uL — ABNORMAL LOW (ref 3.87–5.11)
RDW: 17.6 % — ABNORMAL HIGH (ref 11.5–15.5)
WBC: 12.8 10*3/uL — ABNORMAL HIGH (ref 4.0–10.5)
nRBC: 0.2 % (ref 0.0–0.2)

## 2020-08-18 LAB — MRSA PCR SCREENING: MRSA by PCR: NEGATIVE

## 2020-08-18 MED ORDER — PANTOPRAZOLE SODIUM 40 MG IV SOLR
40.0000 mg | Freq: Two times a day (BID) | INTRAVENOUS | Status: DC
  Administered 2020-08-18 – 2020-08-20 (×4): 40 mg via INTRAVENOUS
  Filled 2020-08-18 (×4): qty 40

## 2020-08-18 MED ORDER — FUROSEMIDE 10 MG/ML IJ SOLN
40.0000 mg | Freq: Once | INTRAMUSCULAR | Status: AC
  Administered 2020-08-18: 40 mg via INTRAVENOUS
  Filled 2020-08-18: qty 4

## 2020-08-18 NOTE — Progress Notes (Signed)
[facility]  ROUNDING NOTE   Subjective:   Patient seen sitting in chair Alert and oriented Husband at bedside Admits to poor appetite for several days, but was able to eat breakfast Denies nausea Currently on 2L O2 Says she was trying to not wear it but became winded after ADL's    Objective:  Vital signs in last 24 hours:  Temp:  [97.8 F (36.6 C)-98.6 F (37 C)] 98 F (36.7 C) (03/28 1219) Pulse Rate:  [91-115] 106 (03/28 1219) Resp:  [15-24] 20 (03/28 1124) BP: (111-138)/(49-72) 123/49 (03/28 1219) SpO2:  [98 %-100 %] 100 % (03/28 1219) Weight:  [74.6 kg-75.2 kg] 74.6 kg (03/28 0715)  Weight change: -1.225 kg Filed Weights   08/16/20 1117 08/17/20 1500 08/18/20 0715  Weight: 76.4 kg 75.2 kg 74.6 kg    Intake/Output: I/O last 3 completed shifts: In: 540 [P.O.:540] Out: 1650 [Urine:1650]   Intake/Output this shift:  Total I/O In: 240 [P.O.:240] Out: -   Physical Exam: General: NAD,   Head: Normocephalic, atraumatic. Moist oral mucosal membranes  Eyes: Anicteric, PERRL  Neck: Supple, trachea midline  Lungs:  Clear to auscultation  Heart: S1S2 present Regular rate and rhythm  Abdomen:  Soft, nontender,   Extremities:  min peripheral edema.  Neurologic: Nonfocal, moving all four extremities  Skin: No lesions       Basic Metabolic Panel: Recent Labs  Lab 08/15/20 1715 08/15/20 2320 08/16/20 0739 08/17/20 0812 08/18/20 0818  NA 122* 122* 124* 126* 130*  K 4.7 4.2 4.3 3.8 4.2  CL 87* 88* 89* 92* 94*  CO2 20* 22 23 24 25   GLUCOSE 131* 119* 106* 119* 115*  BUN 19 23 19 16 16   CREATININE 0.82 0.90 0.81 0.71 0.80  CALCIUM 9.0 8.4* 8.5* 8.5* 9.2    Liver Function Tests: Recent Labs  Lab 08/15/20 1448  AST 31  ALT 14  ALKPHOS 92  BILITOT 1.7*  PROT 7.7  ALBUMIN 3.7   No results for input(s): LIPASE, AMYLASE in the last 168 hours. No results for input(s): AMMONIA in the last 168 hours.  CBC: Recent Labs  Lab  08/14/20 1519 08/15/20 1342 08/16/20 0739 08/17/20 0812 08/18/20 0818  WBC 14.9* 16.9* 17.0* 13.4* 12.8*  NEUTROABS 10.6* 11.7*  --   --   --   HGB 10.5* 13.1 11.4* 10.0* 10.1*  HCT 30.3* 39.0 33.0* 28.4* 30.0*  MCV 84.6 88.6 87.5 85.8 88.5  PLT 41* 44* 45* 51* 62*    Cardiac Enzymes: No results for input(s): CKTOTAL, CKMB, CKMBINDEX, TROPONINI in the last 168 hours.  BNP: Invalid input(s): POCBNP  CBG: Recent Labs  Lab 08/12/20 0757  GLUCAP 109*    Microbiology: Results for orders placed or performed during the hospital encounter of 08/15/20  Body fluid culture w Gram Stain     Status: None (Preliminary result)   Collection Time: 08/15/20 10:30 AM   Specimen: Pleura; Body Fluid  Result Value Ref Range Status   Specimen Description   Final    PLEURAL Performed at [facility], [address] [place], [place] [address]    Special Requests   Final    PLEURAL Performed at [facility], [address] [place], [place] [address]    Gram Stain   Final    FEW WBC PRESENT, PREDOMINANTLY MONONUCLEAR NO ORGANISMS SEEN    Culture   Final    NO GROWTH 2 DAYS Performed at [facility], [place]  [address] [place], [place] [address]    Report Status PENDING  Incomplete  Resp Panel by RT-PCR (Flu A&B, Covid) Nasopharyngeal Swab     Status: None   Collection Time: 08/15/20  3:42 PM   Specimen: Nasopharyngeal Swab; Nasopharyngeal(NP) swabs in vial transport medium  Result Value Ref Range Status   SARS Coronavirus 2 by RT PCR NEGATIVE NEGATIVE Final    Comment: (NOTE) SARS-CoV-2 target nucleic acids are NOT DETECTED.  The SARS-CoV-2 RNA is generally detectable in upper respiratory specimens during the acute phase of infection. The lowest concentration of SARS-CoV-2 viral copies this assay can detect is 138 copies/mL. A negative result does not preclude SARS-Cov-2 infection and should not be used as the sole basis for treatment or other patient  management decisions. A negative result may occur with  improper specimen collection/handling, submission of specimen other than nasopharyngeal swab, presence of viral mutation(s) within the areas targeted by this assay, and inadequate number of viral copies(<138 copies/mL). A negative result must be combined with clinical observations, patient history, and epidemiological information. The expected result is Negative.  Fact Sheet for Patients:  EntrepreneurPulse.com.au  Fact Sheet for Healthcare Providers:  IncredibleEmployment.be  This test is no t yet approved or cleared by the [place] FDA and  has been authorized for detection and/or diagnosis of SARS-CoV-2 by FDA under an Emergency Use Authorization (EUA). This EUA will remain  in effect (meaning this test can be used) for the duration of the COVID-19 declaration under Section 564(b)(1) of the Act, 21 U.S.C.section 360bbb-3(b)(1), unless the authorization is terminated  or revoked sooner.       Influenza A by PCR NEGATIVE NEGATIVE Final   Influenza B by PCR NEGATIVE NEGATIVE Final    Comment: (NOTE) The Xpert Xpress SARS-CoV-2/FLU/RSV plus assay is intended as an aid in the diagnosis of influenza from Nasopharyngeal swab specimens and should not be used as a sole basis for treatment. Nasal washings and aspirates are unacceptable for Xpert Xpress SARS-CoV-2/FLU/RSV testing.  Fact Sheet for Patients: EntrepreneurPulse.com.au  Fact Sheet for Healthcare Providers: IncredibleEmployment.be  This test is not yet approved or cleared by the [place] FDA and has been authorized for detection and/or diagnosis of SARS-CoV-2 by FDA under an Emergency Use Authorization (EUA). This EUA will remain in effect (meaning this test can be used) for the duration of the COVID-19 declaration under Section 564(b)(1) of the Act, 21 U.S.C. section 360bbb-3(b)(1),  unless the authorization is terminated or revoked.  Performed at [facility], [address] [place], [place] [address]   Culture, blood (Routine X 2) w Reflex to ID Panel     Status: None (Preliminary result)   Collection Time: 08/15/20  4:55 PM   Specimen: BLOOD  Result Value Ref Range Status   Specimen Description BLOOD BLOOD LEFT ARM  Final   Special Requests   Final    BOTTLES DRAWN AEROBIC AND ANAEROBIC Blood Culture adequate volume   Culture   Final    NO GROWTH 3 DAYS Performed at [facility], [address] [place], [place] [address]    Report Status PENDING  Incomplete  Culture, blood (Routine X 2) w Reflex to ID Panel     Status: None (Preliminary result)   Collection Time: 08/15/20  5:16 PM   Specimen: BLOOD  Result Value Ref Range Status   Specimen Description BLOOD BLOOD LEFT HAND  Final   Special Requests   Final    BOTTLES DRAWN AEROBIC AND ANAEROBIC Blood  Culture adequate volume   Culture   Final    NO GROWTH 3 DAYS Performed at [facility], [address] [place], [place] [address]    Report Status PENDING  Incomplete    Coagulation Studies: No results for input(s): LABPROT, INR in the last 72 hours.  Urinalysis: No results for input(s): COLORURINE, LABSPEC, PHURINE, GLUCOSEU, HGBUR, BILIRUBINUR, KETONESUR, PROTEINUR, UROBILINOGEN, NITRITE, LEUKOCYTESUR in the last 72 hours.  Invalid input(s): APPERANCEUR    Imaging: DG Chest 1 View  Result Date: 08/17/2020 CLINICAL DATA:  Acute respiratory failure EXAM: CHEST  1 VIEW COMPARISON:  08/15/2020 FINDINGS: The cardiomediastinal silhouette is unchanged. Airspace disease/consolidation within the LEFT mid-lower lung is slightly improved. Trace bilateral pleural effusions are now noted. New pulmonary vascular congestion is present. There is no evidence of pneumothorax. IMPRESSION: Slightly improved LEFT mid-lower lung airspace disease/consolidation with new pulmonary vascular  congestion and trace bilateral pleural effusions. Electronically Signed   By: Margarette Canada M.D.   On: 08/17/2020 11:19     Medications:   . azithromycin 500 mg (08/17/20 1828)  . cefTRIAXone (ROCEPHIN)  IV 1 g (08/17/20 1744)   . albuterol  2 puff Inhalation Q6H  . allopurinol  300 mg Oral Daily  . carvedilol  25 mg Oral BID  . cholecalciferol  2,000 Units Oral Daily  . dextromethorphan-guaiFENesin  1 tablet Oral BID  . feeding supplement  237 mL Oral BID BM  . ipratropium  2 puff Inhalation Q6H  . levothyroxine  50 mcg Oral Q0600  . multivitamin with minerals  1 tablet Oral Daily  . pravastatin  40 mg Oral Daily  . sodium chloride  1 g Oral BID WC   acetaminophen, benzonatate, guaiFENesin-dextromethorphan, hydrALAZINE, loratadine, ondansetron (ZOFRAN) IV  Assessment/ Plan:  Ms. Natasha Farrell is a 75 y.o.  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

## 2020-08-18 NOTE — Consult Note (Signed)
[facility]  Date of admission:  08/15/2020  Inpatient day:  08/18/2020  Consulting physician: Dr Niel Hummer.   Reason for Consultation:  Worsening respiratory failure after thoracentesis.  Pleural fluid c/w exudate.   Chief Complaint: Natasha Farrell is a 75 y.o. 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  HPI: The patient was last seen in the medical oncology clinic on 08/14/2020.  At that time she was undergoing a work-up for presumed lymphoma.  She presented with diffuse adenopathy.  Left axillary lymph node biopsy on 08/01/2020 remains pending.  Bone marrow on 08/04/2020 revealed involvement with an atypical lymphoid proliferation.  Flow cytometry revealed no definitive atypical B-cell population no aberrant T-cell population.  Cytogenetics were normal.  PET scan on 08/12/2020 revealed widespread adenopathy, visceral involvement and signs of near diffuse, multifocal bony involvement, distribution most c/w lymphoma, Deauville category 5. There was ileal involvement. Peritoneal involvement was suggested as well with nodular changes about the peritoneum but without significant FDG uptake. There was paraspinal soft tissue without frank bony destruction. Scattered areas of sclerosis underestimated the degree of bony involvement. Abdominal ascites may be related to anasarca and or lymphatic congestion with marked enlargement of LEFT-sided pleural effusion and mild enlargement of small RIGHT-sided pleural fluid.  She underwent thoracentesis on 08/15/2020.  Thoracentesis yielded 1.6 L of bloody fluid.  LDH was 128 (3-23) .  Flow cytometry revealed no significant immunophenotypic abnormality.  Cytology is pending.  Culture is negative.  She tolerated the thoracentesis well.  On the way home, she noticed increased shortness of breath.  She describes coughing up or throwing up yellow bile-like fluid.  Her  husband brought her home post procedure but then returned to the emergency room.  ER evaluation revealed acute respiratory distress with oxygen saturation at 86% which improved to 96% on 4 L/min oxygen.   Heart rate was 120 with a respiratory rate of 35.  WBC was 16,900.  BNP 22.8.  COVID-19, influenza A and B were negative.  Troponin was 7.  Sodium was 122.  Chest x-ray revealed fluid and alveolar airspace disease in the left lower lobe suggestive of reexpansion pulmonary edema versus pneumonia.  Small residual left-sided effusion and a small right-sided effusion.  She was empirically treated for sepsis.  Received 1 dose of Lasix in the ER with no additional Lasix secondary to presumed sepsis.  She began ceftriaxone and azithromycin.  Cultures are negative to date.  Follow-up CXR on 08/17/2020 revealed slightly improved left mid-lower lung airspace disease/consolidation with new pulmonary vascular congestion and trace bilateral pleural effusions.  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  She is on fluid restriction and salt tablets.  Sodium is 130 today.  Symptomatically, she is feeling better.  She remains fatigued.  She notes an upset stomach.  She describes black stool.  She denies any fever or chills.  She still has a slight cough.   Past Medical History:  Diagnosis Date  . Hx of hysterectomy   . Hyperlipidemia   . Hypertension   . Thyroid disease    rt thryroidectomy  09/25/2010    Past Surgical History:  Procedure Laterality Date  . BREAST BIOPSY Right 04/21/2020   hydromark 3 [place] bx path pending  . LYMPH NODE BIOPSY Left 08/01/2020   Procedure: LYMPH NODE BIOPSY;  Surgeon: Robert Bellow, MD;  Location: [facility] ORS;  Service: General;  Laterality: Left;    Family  History  Problem Relation Age of Onset  . Breast cancer Sister 11  . Colon cancer Sister   . Kidney cancer Sister     Social History:  reports that she has never smoked. She has never used smokeless tobacco. She reports  that she does not drink alcohol and does not use drugs.  The patient denies any exposure to radiation or toxins.  She is a retired Librarian, academic. Her husband's name is Natasha Farrell.  The patient lives in [place].  She is alone today.  Allergies:  Allergies  Allergen Reactions  . Lisinopril Swelling    angioedema    Medications Prior to Admission  Medication Sig Dispense Refill  . acetaminophen (TYLENOL) 500 MG tablet Take 500 mg by mouth every 6 (six) hours as needed for moderate pain, fever or headache.    . allopurinol (ZYLOPRIM) 300 MG tablet Take 1 tablet (300 mg total) by mouth daily. 30 tablet 2  . amLODipine (NORVASC) 10 MG tablet Take 1 tablet by mouth daily.    . carvedilol (COREG) 25 MG tablet Take 1 tablet (25 mg total) by mouth 2 (two) times daily. 180 tablet 3  . cholecalciferol (VITAMIN D3) 25 MCG (1000 UNIT) tablet Take 2,000 Units by mouth daily.    . pravastatin (PRAVACHOL) 40 MG tablet Take 40 mg by mouth daily.    Marland Kitchen spironolactone (ALDACTONE) 50 MG tablet Take 50 mg by mouth daily.    Marland Kitchen SYNTHROID 50 MCG tablet Take 1 tablet (50 mcg total) by mouth daily. 30 tablet 3  . loratadine (CLARITIN) 10 MG tablet Take 10 mg by mouth daily as needed for allergies.      Review of Systems  Constitutional: Positive for malaise/fatigue and weight loss. Negative for chills, diaphoresis and fever.  HENT: Negative.  Negative for congestion, ear discharge, ear pain, hearing loss, nosebleeds, sinus pain, sore throat and tinnitus.   Eyes: Negative.  Negative for blurred vision, double vision and pain.  Respiratory: Positive for cough, sputum production and shortness of breath. Negative for hemoptysis, wheezing and stridor.   Cardiovascular: Positive for leg swelling. Negative for chest pain, palpitations and orthopnea.  Gastrointestinal: Positive for melena and nausea. Negative for abdominal pain, blood in stool, constipation, diarrhea and vomiting.  Genitourinary: Negative for dysuria, frequency,  hematuria and urgency.  Musculoskeletal: Negative for back pain, myalgias and neck pain.  Skin: Negative.  Negative for rash.  Neurological: Negative for dizziness, sensory change, speech change, focal weakness, weakness (generalized) and headaches.  Endo/Heme/Allergies: Does not bruise/bleed easily.  Psychiatric/Behavioral: Negative for depression and memory loss. The patient is not nervous/anxious and does not have insomnia.     Vitals:  Blood pressure (!) 147/50, pulse (!) 114, temperature 98.6 F (37 C), temperature source Oral, resp. rate 20, height 5\' 4"  (1.626 m), weight 164 lb 8 oz (74.6 kg), SpO2 100 %.   Physical Exam Vitals and nursing note reviewed.  Constitutional:      General: She is not in acute distress.    Appearance: She is not toxic-appearing.     Comments: Fatigued appearing woman sitting in a reclining chair in her room on the medical unit.  HENT:     Head: Normocephalic and atraumatic.     Mouth/Throat:     Mouth: Mucous membranes are moist.     Pharynx: No oropharyngeal exudate or posterior oropharyngeal erythema.  Eyes:     General: No scleral icterus.    Conjunctiva/sclera: Conjunctivae normal.     Pupils: Pupils are  equal, round, and reactive to light.  Cardiovascular:     Rate and Rhythm: Tachycardia present.  Pulmonary:     Effort: No respiratory distress.     Breath sounds: No stridor. No wheezing or rhonchi.     Comments: Decreased breath sounds LLL. Chest:  Breasts:     Right: Axillary adenopathy and supraclavicular adenopathy present.     Left: Axillary adenopathy and supraclavicular adenopathy present.    Abdominal:     General: Bowel sounds are normal. There is no distension.     Tenderness: There is no guarding or rebound.     Comments: Epigastric fullness.  Hepatosplenomegaly.  Musculoskeletal:     Cervical back: Normal range of motion and neck supple.     Right lower leg: Edema present.     Left lower leg: Edema present.   Lymphadenopathy:     Head:     Right side of head: Submental, submandibular and posterior auricular adenopathy present.     Left side of head: Submental, submandibular and posterior auricular adenopathy present.     Cervical: Cervical adenopathy present.     Upper Body:     Right upper body: Supraclavicular adenopathy and axillary adenopathy present.     Left upper body: Supraclavicular adenopathy and axillary adenopathy present.     Lower Body: Right inguinal adenopathy present. Left inguinal adenopathy present.  Skin:    General: Skin is warm and dry.     Coloration: Skin is pale. Skin is not jaundiced.     Findings: No bruising, erythema or rash.  Neurological:     General: No focal deficit present.     Mental Status: She is alert and oriented to person, place, and time. Mental status is at baseline.  Psychiatric:        Mood and Affect: Mood normal.        Behavior: Behavior normal.        Thought Content: Thought content normal.        Judgment: Judgment normal.     Results for orders placed or performed during the hospital encounter of 08/15/20 (from the past 48 hour(s))  Lactate dehydrogenase     Status: None   Collection Time: 08/17/20  3:39 AM  Result Value Ref Range   LDH 182 98 - 192 U/L    Comment: Performed at [facility], [address] [place], [place] [address]  Basic metabolic panel     Status: Abnormal   Collection Time: 08/17/20  8:12 AM  Result Value Ref Range   Sodium 126 (L) 135 - 145 mmol/L   Potassium 3.8 3.5 - 5.1 mmol/L   Chloride 92 (L) 98 - 111 mmol/L   CO2 24 22 - 32 mmol/L   Glucose, Bld 119 (H) 70 - 99 mg/dL    Comment: Glucose reference range applies only to samples taken after fasting for at least 8 hours.   BUN 16 8 - 23 mg/dL   Creatinine, Ser 0.71 0.44 - 1.00 mg/dL   Calcium 8.5 (L) 8.9 - 10.3 mg/dL   GFR, Estimated >60 >60 mL/min    Comment: (NOTE) Calculated using the CKD-EPI Creatinine Equation (2021)    Anion gap 10 5  - 15    Comment: Performed at [facility], [address] [place], [place] [address]  CBC     Status: Abnormal   Collection Time: 08/17/20  8:12 AM  Result Value Ref Range   WBC 13.4 (H) 4.0 - 10.5 K/uL  RBC 3.31 (L) 3.87 - 5.11 MIL/uL   Hemoglobin 10.0 (L) 12.0 - 15.0 g/dL   HCT 28.4 (L) 36.0 - 46.0 %   MCV 85.8 80.0 - 100.0 fL   MCH 30.2 26.0 - 34.0 pg   MCHC 35.2 30.0 - 36.0 g/dL   RDW 17.2 (H) 11.5 - 15.5 %   Platelets 51 (L) 150 - 400 K/uL    Comment: Immature Platelet Fraction may be clinically indicated, consider ordering this additional test [id number]    nRBC 0.0 0.0 - 0.2 %    Comment: Performed at [facility], [address] [place], [place] [address]  CBC     Status: Abnormal   Collection Time: 08/18/20  8:18 AM  Result Value Ref Range   WBC 12.8 (H) 4.0 - 10.5 K/uL   RBC 3.39 (L) 3.87 - 5.11 MIL/uL   Hemoglobin 10.1 (L) 12.0 - 15.0 g/dL   HCT 30.0 (L) 36.0 - 46.0 %   MCV 88.5 80.0 - 100.0 fL   MCH 29.8 26.0 - 34.0 pg   MCHC 33.7 30.0 - 36.0 g/dL   RDW 17.6 (H) 11.5 - 15.5 %   Platelets 62 (L) 150 - 400 K/uL    Comment: Immature Platelet Fraction may be clinically indicated, consider ordering this additional test [id number]    nRBC 0.2 0.0 - 0.2 %    Comment: Performed at [facility], [address] [place], [place] [address]  Basic metabolic panel     Status: Abnormal   Collection Time: 08/18/20  8:18 AM  Result Value Ref Range   Sodium 130 (L) 135 - 145 mmol/L   Potassium 4.2 3.5 - 5.1 mmol/L   Chloride 94 (L) 98 - 111 mmol/L   CO2 25 22 - 32 mmol/L   Glucose, Bld 115 (H) 70 - 99 mg/dL    Comment: Glucose reference range applies only to samples taken after fasting for at least 8 hours.   BUN 16 8 - 23 mg/dL   Creatinine, Ser 0.80 0.44 - 1.00 mg/dL   Calcium 9.2 8.9 - 10.3 mg/dL   GFR, Estimated >60 >60 mL/min    Comment: (NOTE) Calculated using the CKD-EPI Creatinine Equation (2021)    Anion gap 11 5 - 15     Comment: Performed at [facility], [address] [place], [place] [address]  MRSA PCR Screening     Status: None   Collection Time: 08/18/20 12:35 PM   Specimen: Nasal Mucosa; Nasopharyngeal  Result Value Ref Range   MRSA by PCR NEGATIVE NEGATIVE    Comment:        The GeneXpert MRSA Assay (FDA approved for NASAL specimens only), is one component of a comprehensive MRSA colonization surveillance program. It is not intended to diagnose MRSA infection nor to guide or monitor treatment for MRSA infections. Performed at [facility], [address] [place], [place] [address]    DG Chest 1 View  Result Date: 08/17/2020 CLINICAL DATA:  Acute respiratory failure EXAM: CHEST  1 VIEW COMPARISON:  08/15/2020 FINDINGS: The cardiomediastinal silhouette is unchanged. Airspace disease/consolidation within the LEFT mid-lower lung is slightly improved. Trace bilateral pleural effusions are now noted. New pulmonary vascular congestion is present. There is no evidence of pneumothorax. IMPRESSION: Slightly improved LEFT mid-lower lung airspace disease/consolidation with new pulmonary vascular congestion and trace bilateral pleural effusions. Electronically Signed   By: Margarette Canada M.D.   On: 08/17/2020 11:19    Assessment:  The patient  is a 75 y.o. 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  Pathology is pending.  She presented with acute shortness of breath s/p left thoracentesis on 08/15/2020.  CXR revealed re-expansion pulmonary edema vs infiltrate.  Left sided thoracentesis on 08/15/2020 revealed 1.6 liters of bloody fluid.  Flow cytometry is negative.  Cultures are negative.  PET scan on 08/12/2020 revealed widespread adenopathy, visceral involvement and signs of near diffuse, multifocal bony involvement, distribution most c/w lymphoma, Deauville category 5. There was ileal involvement. Peritoneal involvement was suggested as well with  nodular changes about the peritoneum but without significant FDG uptake. There was paraspinal soft tissue without frank bony destruction. Scattered areas of sclerosis underestimated the degree of bony involvement. Abdominal ascites may be related to anasarca and or lymphatic congestion with marked enlargement of LEFT-sided pleural effusion and mild enlargement of small RIGHT-sided pleural fluid.  She has hyponatremia secondary to SIADH.  Sodium has improved from 122 to 130 with fluid restriction and salt tablets.  Symptomatically, shortness of breath has improved.  She has ongoing nausea/GI upset.   Exam reveals diffuse adenopathy and hepatosplenomegaly.  Platelet count is 62,000.  Plan:   1.   Probable stage IV lymphoma  Final pathology remains unavailable.  I contacted [facility] hematopathology.   Follow-up call planned for tomorrow morning.  Anticipate diagnosis of high grade lymphoma with plans for systemic chemotherapy this week.  No current evidence of tumor lysis on allopurinol.  Plan port-a-cath secondary to poor venous access. 2.   Acute shortness of breath  Patient developed acute shortness of breath following thoracentesis  Etiology felt secondary to expansion pulmonary edema vs infiltrate.  Patient empirically treated with azithromycin and ceftriaxone.  Cultures negative to date.  Appreciate ID consultation. 3.  Melena and GI upset  Patient notes ongoing GI upset and black stools.  PET scan revealed ileal involvement.  Patient on Protonix.  Guaiac all stools. 4.   Hyponatremia  Etiology secondary to SIADH.  Continue fluid restriction and salt tablets. 5.  Disposition  Anticipate home in 1-2 days.  Thank you for allowing me to participate in JA PISTOLE 's care.  I will follow her closely with you while hospitalized and after discharge in the outpatient department.   Lequita Asal, MD  08/18/2020, 5:01 PM

## 2020-08-18 NOTE — Care Management Important Message (Signed)
Important Message  Patient Details  Name: Natasha Farrell MRN: [id number] Date of Birth: 10-29-1945   Medicare Important Message Given:  Yes     Dannette Barbara 08/18/2020, 11:46 AM

## 2020-08-18 NOTE — Consult Note (Signed)
NAME: Natasha Farrell  DOB: [birth year]  MRN: [id number]  Date/Time: 08/18/2020 11:41 AM  REQUESTING PROVIDER: Dr. Mike Gip Subjective:  REASON FOR CONSULT: Pleural effusion ? Natasha Farrell is a 75 y.o. female with a history of hypertension, secondary hypothyroidism, , generalized lymphadenopathy and splenomegaly presented on 08/15/20  with shortness of breath and cough after having thoracentesis that morning  Patient has had lymphadenopathy since October 2021.  Had a needle biopsy of right axillary node and that was thought to malignancy Because of worsening lymphadenopathy she underwent lymph node biopsy on 08/01/2020 by Dr. Bary Castilla She had a bone marrow biopsy on 08/04/2020 that revealed variable cellular bone marrow with trilineage hematopoiesis and involvement by atypical lymphoid proliferation.  She is followed by Dr. Mike Gip as outpatient.  .  Patient had pleurocentesis done on 08/15/20  and 2 L of fluid was removed.  She started  coughing since then and is having yellow phlegm.  Worsening shortness of breath.  No chest pain or fever. In the ED vitals 142/85, temp 97.6, pulse rate 116, sats 96% on 4 L oxygen.  WBC was 16.9, Hb 13.1, platelet 44 and creatinine 0.90.  AST 31 ALT 14, total bilirubin 1.7, alkaline phosphatase 92.  Lactate was 2.8, procalcitonin was 0.81, osmolality 264.  Sodium was 122. Pleural fluid chemistry showed LDH of 128, total protein of 5.4.  No cell count was done.  Culture has been negative so far.  Past Medical History:  Diagnosis Date  . Hx of hysterectomy   . Hyperlipidemia   . Hypertension   . Thyroid disease    rt thryroidectomy  09/25/2010    Past Surgical History:  Procedure Laterality Date  . BREAST BIOPSY Right 04/21/2020   hydromark 3 [place] bx path pending  . LYMPH NODE BIOPSY Left 08/01/2020   Procedure: LYMPH NODE BIOPSY;  Surgeon: Robert Bellow, MD;  Location: [facility] ORS;  Service: General;  Laterality: Left;    Social History   Socioeconomic  History  . Marital status: Married    Spouse name: Not on file  . Number of children: Not on file  . Years of education: Not on file  . Highest education level: Not on file  Occupational History  . Not on file  Tobacco Use  . Smoking status: Never Smoker  . Smokeless tobacco: Never Used  Vaping Use  . Vaping Use: Never used  Substance and Sexual Activity  . Alcohol use: No  . Drug use: No  . Sexual activity: Not on file  Other Topics Concern  . Not on file  Social History Narrative  . Not on file   Social Determinants of Health   Financial Resource Strain: Not on file  Food Insecurity: Not on file  Transportation Needs: Not on file  Physical Activity: Not on file  Stress: Not on file  Social Connections: Not on file  Intimate Partner Violence: Not on file    Family History  Problem Relation Age of Onset  . Breast cancer Sister 50  . Colon cancer Sister   . Kidney cancer Sister    Allergies  Allergen Reactions  . Lisinopril Swelling    angioedema   I? Current Facility-Administered Medications  Medication Dose Route Frequency Provider Last Rate Last Admin  . acetaminophen (TYLENOL) tablet 650 mg  650 mg Oral Q6H PRN Ivor Costa, MD   650 mg at 08/16/20 1836  . albuterol (VENTOLIN HFA) 108 (90 Base) MCG/ACT inhaler 2 puff  2 puff Inhalation Q6H  Regalado, Belkys A, MD   2 puff at 08/18/20 1022  . allopurinol (ZYLOPRIM) tablet 300 mg  300 mg Oral Daily Ivor Costa, MD   300 mg at 08/18/20 1013  . azithromycin (ZITHROMAX) 500 mg in sodium chloride 0.9 % 250 mL IVPB  500 mg Intravenous Q24H Ivor Costa, MD 250 mL/hr at 08/17/20 1828 500 mg at 08/17/20 1828  . benzonatate (TESSALON) capsule 100 mg  100 mg Oral TID PRN Regalado, Belkys A, MD   100 mg at 08/17/20 2207  . carvedilol (COREG) tablet 25 mg  25 mg Oral BID Ivor Costa, MD   25 mg at 08/18/20 1013  . cefTRIAXone (ROCEPHIN) 1 g in sodium chloride 0.9 % 100 mL IVPB  1 g Intravenous Q24H Ivor Costa, MD 200 mL/hr at  08/17/20 1744 1 g at 08/17/20 1744  . cholecalciferol (VITAMIN D3) tablet 2,000 Units  2,000 Units Oral Daily Ivor Costa, MD   2,000 Units at 08/18/20 1013  . dextromethorphan-guaiFENesin (MUCINEX DM) 30-600 MG per 12 hr tablet 1 tablet  1 tablet Oral BID Regalado, Belkys A, MD   1 tablet at 08/18/20 1013  . feeding supplement (ENSURE ENLIVE / ENSURE PLUS) liquid 237 mL  237 mL Oral BID BM Regalado, Belkys A, MD   237 mL at 08/18/20 1018  . guaiFENesin-dextromethorphan (ROBITUSSIN DM) 100-10 MG/5ML syrup 5 mL  5 mL Oral Q4H PRN Regalado, Belkys A, MD   5 mL at 08/17/20 2207  . hydrALAZINE (APRESOLINE) injection 5 mg  5 mg Intravenous Q2H PRN Ivor Costa, MD      . ipratropium (ATROVENT HFA) inhaler 2 puff  2 puff Inhalation Q6H Regalado, Belkys A, MD   2 puff at 08/18/20 1020  . levothyroxine (SYNTHROID) tablet 50 mcg  50 mcg Oral Q0600 Ivor Costa, MD   50 mcg at 08/18/20 0513  . loratadine (CLARITIN) tablet 10 mg  10 mg Oral Daily PRN Ivor Costa, MD      . multivitamin with minerals tablet 1 tablet  1 tablet Oral Daily Regalado, Belkys A, MD   1 tablet at 08/18/20 1013  . ondansetron (ZOFRAN) injection 4 mg  4 mg Intravenous Q8H PRN Ivor Costa, MD      . pravastatin (PRAVACHOL) tablet 40 mg  40 mg Oral Daily Ivor Costa, MD   40 mg at 08/18/20 1013  . sodium chloride tablet 1 g  1 g Oral BID WC Bhutani, Manpreet S, MD   1 g at 08/18/20 1013     Abtx:  Anti-infectives (From admission, onward)   Start     Dose/Rate Route Frequency Ordered Stop   08/15/20 1645  cefTRIAXone (ROCEPHIN) 1 g in sodium chloride 0.9 % 100 mL IVPB        1 g 200 mL/hr over 30 Minutes Intravenous Every 24 hours 08/15/20 1627     08/15/20 1645  azithromycin (ZITHROMAX) 500 mg in sodium chloride 0.9 % 250 mL IVPB        500 mg 250 mL/hr over 60 Minutes Intravenous Every 24 hours 08/15/20 1627        REVIEW OF SYSTEMS:  Const: negative fever, negative chills, negative weight loss Eyes: negative diplopia or visual  changes, negative eye pain ENT: negative coryza, negative sore throat Resp:  cough, , dyspnea, yellow sputum Cards: negative for chest pain, palpitations, has lower extremity edema GU: negative for frequency, dysuria and hematuria GI: Negative for abdominal pain, diarrhea, bleeding, constipation Skin: negative for rash and pruritus Heme:  negative for easy bruising and gum/nose bleeding MS: generalized weakness Neurolo:negative for headaches, dizziness, vertigo, memory problems  Psych: negative for feelings of anxiety, depression  Endocrine: negative for thyroid, diabetes Allergy/Immunology-as above? Objective:  VITALS:  BP (!) 111/49 (BP Location: Right Arm)   Pulse (!) 115   Temp 98.6 F (37 C) (Oral)   Resp 20   Ht _0  (1.626 m)   Wt 74.6 kg   SpO2 100%   BMI 28.24 kg/m  PHYSICAL EXAM:  General: Alert, cooperative, no distress, appears stated age.  Head: Normocephalic, without obvious abnormality, atraumatic. Eyes: Conjunctivae clear, anicteric sclerae. Pupils are equal ENT Nares normal. No drainage or sinus tenderness. Lips, mucosa, and tongue normal. No Thrush Neck: Supple, symmetrical, no adenopathy, thyroid: non tender no carotid bruit and no JVD. Back: No CVA tenderness. Lungs: b/l air entry- decreased left side Heart: Regular rate and rhythm, no murmur, rub or gallop. Abdomen: Soft,  distended.  Extremities edema anklesSkin: No rashes or lesions. Or bruising Lymph: Cervical,and axillary palpable Neurologic: Grossly non-focal Pertinent Labs Lab Results CBC    Component Value Date/Time   WBC 12.8 (H) 08/18/2020 0818   RBC 3.39 (L) 08/18/2020 0818   HGB 10.1 (L) 08/18/2020 0818   HGB 12.6 07/28/2020 1531   HCT 30.0 (L) 08/18/2020 0818   PLT 62 (L) 08/18/2020 0818   MCV 88.5 08/18/2020 0818   MCH 29.8 08/18/2020 0818   MCHC 33.7 08/18/2020 0818   RDW 17.6 (H) 08/18/2020 0818   LYMPHSABS 2.4 08/15/2020 1342   MONOABS 1.8 (H) 08/15/2020 1342   EOSABS 0.2  08/15/2020 1342   BASOSABS 0.1 08/15/2020 1342    CMP Latest Ref Rng & Units 08/18/2020 08/17/2020 08/16/2020  Glucose 70 - 99 mg/dL 115(H) 119(H) 106(H)  BUN 8 - 23 mg/dL _1 Creatinine 0.44 - 1.00 mg/dL 0.80 0.71 0.81  Sodium 135 - 145 mmol/L 130(L) 126(L) 124(L)  Potassium 3.5 - 5.1 mmol/L 4.2 3.8 4.3  Chloride 98 - 111 mmol/L 94(L) 92(L) 89(L)  CO2 22 - 32 mmol/L _2 Calcium 8.9 - 10.3 mg/dL 9.2 8.5(L) 8.5(L)  Total Protein 6.5 - 8.1 g/dL - - -  Total Bilirubin 0.3 - 1.2 mg/dL - - -  Alkaline Phos 38 - 126 U/L - - -  AST 15 - 41 U/L - - -  ALT 0 - 44 U/L - - -      Microbiology: Recent Results (from the past 240 hour(s))  SARS CORONAVIRUS 2 (TAT 6-24 HRS) Nasopharyngeal Nasopharyngeal Swab     Status: None   Collection Time: 08/13/20 11:15 AM   Specimen: Nasopharyngeal Swab  Result Value Ref Range Status   SARS Coronavirus 2 NEGATIVE NEGATIVE Final    Comment: (NOTE) SARS-CoV-2 target nucleic acids are NOT DETECTED.  The SARS-CoV-2 RNA is generally detectable in upper and lower respiratory specimens during the acute phase of infection. Negative results do not preclude SARS-CoV-2 infection, do not rule out co-infections with other pathogens, and should not be used as the sole basis for treatment or other patient management decisions. Negative results must be combined with clinical observations, patient history, and epidemiological information. The expected result is Negative.  Fact Sheet for Patients: SugarRoll.be  Fact Sheet for Healthcare Providers: https://www.woods-mathews.com/  This test is not yet approved or cleared by the [place] FDA and  has been authorized for detection and/or diagnosis of SARS-CoV-2 by FDA under an Emergency Use Authorization (EUA). This EUA will remain  in effect (meaning this test can be used) for the duration of the COVID-19 declaration under Se ction 564(b)(1) of the Act, 21  U.S.C. section 360bbb-3(b)(1), unless the authorization is terminated or revoked sooner.  Performed at [facility], [place] [address] [place], [place] [address]   Body fluid culture w Gram Stain     Status: None (Preliminary result)   Collection Time: 08/15/20 10:30 AM   Specimen: Pleura; Body Fluid  Result Value Ref Range Status   Specimen Description   Final    PLEURAL Performed at [facility], [address] [place], [place] [address]    Special Requests   Final    PLEURAL Performed at [facility], [address] [place], [place] [address]    Gram Stain   Final    FEW WBC PRESENT, PREDOMINANTLY MONONUCLEAR NO ORGANISMS SEEN    Culture   Final    NO GROWTH 2 DAYS Performed at [facility], [place] [address], [place],  [address]    Report Status PENDING  Incomplete  Resp Panel by RT-PCR (Flu A&B, Covid) Nasopharyngeal Swab     Status: None   Collection Time: 08/15/20  3:42 PM   Specimen: Nasopharyngeal Swab; Nasopharyngeal(NP) swabs in vial transport medium  Result Value Ref Range Status   SARS Coronavirus 2 by RT PCR NEGATIVE NEGATIVE Final    Comment: (NOTE) SARS-CoV-2 target nucleic acids are NOT DETECTED.  The SARS-CoV-2 RNA is generally detectable in upper respiratory specimens during the acute phase of infection. The lowest concentration of SARS-CoV-2 viral copies this assay can detect is 138 copies/mL. A negative result does not preclude SARS-Cov-2 infection and should not be used as the sole basis for treatment or other patient management decisions. A negative result may occur with  improper specimen collection/handling, submission of specimen other than nasopharyngeal swab, presence of viral mutation(s) within the areas targeted by this assay, and inadequate number of viral copies(<138 copies/mL). A negative result must be combined with clinical observations, patient history, and epidemiological information. The expected  result is Negative.  Fact Sheet for Patients:  EntrepreneurPulse.com.au  Fact Sheet for Healthcare Providers:  IncredibleEmployment.be  This test is no t yet approved or cleared by the [place] FDA and  has been authorized for detection and/or diagnosis of SARS-CoV-2 by FDA under an Emergency Use Authorization (EUA). This EUA will remain  in effect (meaning this test can be used) for the duration of the COVID-19 declaration under Section 564(b)(1) of the Act, 21 U.S.C.section 360bbb-3(b)(1), unless the authorization is terminated  or revoked sooner.       Influenza A by PCR NEGATIVE NEGATIVE Final   Influenza B by PCR NEGATIVE NEGATIVE Final    Comment: (NOTE) The Xpert Xpress SARS-CoV-2/FLU/RSV plus assay is intended as an aid in the diagnosis of influenza from Nasopharyngeal swab specimens and should not be used as a sole basis for treatment. Nasal washings and aspirates are unacceptable for Xpert Xpress SARS-CoV-2/FLU/RSV testing.  Fact Sheet for Patients: EntrepreneurPulse.com.au  Fact Sheet for Healthcare Providers: IncredibleEmployment.be  This test is not yet approved or cleared by the [place] FDA and has been authorized for detection and/or diagnosis of SARS-CoV-2 by FDA under an Emergency Use Authorization (EUA). This EUA will remain in effect (meaning this test can be used) for the duration of the COVID-19 declaration under Section 564(b)(1) of the Act, 21 U.S.C. section 360bbb-3(b)(1), unless the authorization is terminated or revoked.  Performed at [facility], [address] [place], [place] [address]   Culture, blood (Routine X 2) w Reflex to ID Panel     Status: None (Preliminary result)   Collection Time: 08/15/20  4:55 PM   Specimen: BLOOD  Result Value Ref Range Status   Specimen Description BLOOD BLOOD LEFT ARM  Final   Special Requests   Final    BOTTLES  DRAWN AEROBIC AND ANAEROBIC Blood Culture adequate volume   Culture   Final    NO GROWTH 3 DAYS Performed at [facility], [address], [place], [place] [address]    Report Status PENDING  Incomplete  Culture, blood (Routine X 2) w Reflex to ID Panel     Status: None (Preliminary result)   Collection Time: 08/15/20  5:16 PM   Specimen: BLOOD  Result Value Ref Range Status   Specimen Description BLOOD BLOOD LEFT HAND  Final   Special Requests   Final    BOTTLES DRAWN AEROBIC AND ANAEROBIC Blood Culture adequate volume   Culture   Final    NO GROWTH 3 DAYS Performed at [facility], [address], [place], [place] [address]    Report Status PENDING  Incomplete    IMAGING RESULTS:  I have personally reviewed the films ? Impression/Recommendation ? ?Left pleural effusion s/p 2 l thoracentesis followed by SOB and new alveloar infiltrate within few hrs  After , concerning for reexpansion pulmonary edema . Infection is in D.D but took quick  Pleural fluid was an exudate- no cell count was done but the gram stain did not show ny neutrophils but predominantly mononuclear cell Culture neg so far Pt has been ceftriaxone and azithromycin for 5 days - will Dc and observe- may do Po augmentin if needed ? 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

## 2020-08-18 NOTE — Plan of Care (Signed)
  Problem: Clinical Measurements: Goal: Will remain free from infection Outcome: Progressing Goal: Cardiovascular complication will be avoided Outcome: Progressing   Problem: Nutrition: Goal: Adequate nutrition will be maintained Outcome: Progressing   Problem: Coping: Goal: Level of anxiety will decrease Outcome: Progressing   Problem: Elimination: Goal: Will not experience complications related to bowel motility Outcome: Progressing Goal: Will not experience complications related to urinary retention Outcome: Progressing   Problem: Safety: Goal: Ability to remain free from injury will improve Outcome: Progressing   Problem: Clinical Measurements: Goal: Will remain free from infection Outcome: Progressing Goal: Cardiovascular complication will be avoided Outcome: Progressing   Problem: Nutrition: Goal: Adequate nutrition will be maintained Outcome: Progressing   Problem: Coping: Goal: Level of anxiety will decrease Outcome: Progressing   Problem: Elimination: Goal: Will not experience complications related to bowel motility Outcome: Progressing Goal: Will not experience complications related to urinary retention Outcome: Progressing   Problem: Safety: Goal: Ability to remain free from injury will improve Outcome: Progressing

## 2020-08-18 NOTE — Progress Notes (Signed)
PROGRESS NOTE    Natasha Farrell  UXL:[id number] DOB: 1946-05-14 DOA: 08/15/2020 PCP: [name], MD   Brief Narrative: 75 year old with past medical history significant for hypertension, hyperlipidemia, hypothyroidism, gout, thrombocytopenia, extensive adenopathy and splenomegaly pleural effusion diastolic heart failure who presents complaining of shortness of breath.  Patient had left side thoracentesis and had 1.6 L of bloody pleural fluid removed by IR the morning of admission 3/25.  She develop shortness of breath, after the procedure which has been progressively worsening.  She also report her productive cough with yellow sputum. Patient was found to be in respiratory distress with oxygen saturation at 86 on room air which improved to 96% on 4 L. Patient reported that she was diagnosed with lymphoma by Dr. Mike Gip with plan to start treatment early next week.   Assessment & Plan:   Principal Problem:   Acute respiratory failure with hypoxia (HCC) Active Problems:   Essential hypertension   Splenomegaly   Pleural effusion, left   Thrombocytopenia (HCC)   Hyperlipidemia   Chronic diastolic CHF (congestive heart failure) (HCC)   Hyponatremia   CAP (community acquired pneumonia)   Severe sepsis (HCC)  1-Acute Hypoxic Respiratory failure with hypoxia: This could be related to expansion pulmonary edema after thoracentesis and or community-acquired pneumonia.  Patient presented with leukocytosis and tachycardia and tachypnea. -Continue with IV antibiotics to cover for pneumonia IV Rocephin and azithromycin -Continue with mucinex,  Flutter valve -Continue with bronchodilators -Repeated Chest x ray: 3-27:  showed new pulmonary vascular congestion, slightly improved left mid to lower lung airspace disease. -received IV lasix 3/27, will repeat dose today/. She is breathing some what better.   2-Severe Sepsis secondary to community-acquired pneumonia: Exudative pleural  effusion Patient presented with leukocytosis, tachycardia tachypnea.  Chest x-ray with findings consistent for pneumonia. Patient treated with IV antibiotics -Strep pneumonia negative.  Legionella knee pneumonia pending -Procalcitonin 0.8, lactic acid 2.8 -Blood cultures no growth to date. -White blood cell down to 12 from 17 -ID consulted.  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  Dr Mike Gip will discuss Diagnosis with [facility]>    4-Hyponatremia; likely secondary to SIADH Sodium 88,, urine osmolarity 314. Nephrology consulted, order TSH panel and cortisol level not low.  Appreciate nephrology assistance. Patient was a started on sodium tablet, water restrictions.  Improved to 130.    Hypertension: Holding spironolactone due to hyponatremia.  Continue with Coreg.  Hold amlodipine to avoid hypotension.  Left-sided pleural effusion: Status post thoracentesis 3/25. Exudative effusion.  Pleural fluid analysis: LDH pleural 128, total protein 5.4, glucose 98.   LDH serum; 182 Lights criteria consistent with Exudative effusion, Pleural fluid Culture no growth to date.  Continue with IV antibiotics.  Needs to follow Cytology;  ID consulted.   Thrombocytopenia: Follow trend, improved.  Hyperlipidemia continue with pravastatin Acute on Chronic diastolic heart failure: Holding spironolactone due to sepsis and hyponatremia. IV lasix times one today.   Melena, Anemia;  Check Occult blood.  Monitor hb.,  Discontinue DVT prophylaxis.  IV protonix.   Estimated body mass index is 28.24 kg/m as calculated from the following:   Height as of this encounter: 5\' 4"  (1.626 m).   Weight as of this encounter: 74.6 kg.   DVT prophylaxis: SCD Due to thrombocytopenia Code Status: Full code Family Communication: Care discussed with patient Disposition Plan:  Status is: Inpatient  Remains inpatient  appropriate because:IV treatments appropriate due to  intensity of illness or inability to take PO   Dispo: The patient is from: Home              Anticipated d/c is to: Home              Patient currently is not medically stable to d/c.   Difficult to place patient No        Consultants:   Oncology  Nephrology  Procedures:   none  Antimicrobials:    Subjective: She feels a little better. Dyspnea improved. Still coughing.  Reported black stool , small amount on and off.   Objective: Vitals:   08/18/20 0511 08/18/20 0715 08/18/20 1124 08/18/20 1219  BP: (!) 124/59 138/72 (!) 111/49 (!) 123/49  Pulse: 91 (!) 105 (!) 115 (!) 106  Resp: 15 18 20    Temp: 98.3 F (36.8 C) 97.8 F (36.6 C) 98.6 F (37 C) 98 F (36.7 C)  TempSrc: Oral Oral Oral Oral  SpO2: 100% 98% 100% 100%  Weight:  74.6 kg    Height:        Intake/Output Summary (Last 24 hours) at 08/18/2020 1550 Last data filed at 08/18/2020 1405 Gross per 24 hour  Intake 540 ml  Output 700 ml  Net -160 ml   Filed Weights   08/16/20 1117 08/17/20 1500 08/18/20 0715  Weight: 76.4 kg 75.2 kg 74.6 kg    Examination:  General exam: NAD Respiratory system: BL crackles.  Cardiovascular system: S 1, S 2 RRR Gastrointestinal system: BS present, soft, nt Central nervous system: Alert Extremities: symmetric power    Data Reviewed: I have personally reviewed following labs and imaging studies  CBC: Recent Labs  Lab 08/14/20 1519 08/15/20 1342 08/16/20 0739 08/17/20 0812 08/18/20 0818  WBC 14.9* 16.9* 17.0* 13.4* 12.8*  NEUTROABS 10.6* 11.7*  --   --   --   HGB 10.5* 13.1 11.4* 10.0* 10.1*  HCT 30.3* 39.0 33.0* 28.4* 30.0*  MCV 84.6 88.6 87.5 85.8 88.5  PLT 41* 44* 45* 51* 62*   Basic Metabolic Panel: Recent Labs  Lab 08/15/20 1715 08/15/20 2320 08/16/20 0739 08/17/20 0812 08/18/20 0818  NA 122* 122* 124* 126* 130*  K 4.7 4.2 4.3 3.8 4.2  CL 87* 88* 89* 92* 94*  CO2 20* 22 23 24 25    GLUCOSE 131* 119* 106* 119* 115*  BUN 19 23 19 16 16   CREATININE 0.82 0.90 0.81 0.71 0.80  CALCIUM 9.0 8.4* 8.5* 8.5* 9.2   GFR: Estimated Creatinine Clearance: 62.9 mL/min (by C-G formula based on SCr of 0.8 mg/dL). Liver Function Tests: Recent Labs  Lab 08/15/20 1448  AST 31  ALT 14  ALKPHOS 92  BILITOT 1.7*  PROT 7.7  ALBUMIN 3.7   No results for input(s): LIPASE, AMYLASE in the last 168 hours. No results for input(s): AMMONIA in the last 168 hours. Coagulation Profile: No results for input(s): INR, PROTIME in the last 168 hours. Cardiac Enzymes: No results for input(s): CKTOTAL, CKMB, CKMBINDEX, TROPONINI in the last 168 hours. BNP (last 3 results) No results for input(s): PROBNP in the last 8760 hours. HbA1C: No results for input(s): HGBA1C in the last 72 hours. CBG: Recent Labs  Lab 08/12/20 0757  GLUCAP 109*   Lipid Profile: No results for input(s): CHOL, HDL, LDLCALC, TRIG, CHOLHDL, LDLDIRECT in the last 72 hours. Thyroid Function Tests: No results for input(s): TSH, T4TOTAL, FREET4, T3FREE, THYROIDAB in the last 72 hours. Anemia Panel: No results for input(s): VITAMINB12, FOLATE,  FERRITIN, TIBC, IRON, RETICCTPCT in the last 72 hours. Sepsis Labs: Recent Labs  Lab 08/15/20 1715 08/15/20 1826  PROCALCITON 0.81  --   LATICACIDVEN 2.8* 2.6*    Recent Results (from the past 240 hour(s))  SARS CORONAVIRUS 2 (TAT 6-24 HRS) Nasopharyngeal Nasopharyngeal Swab     Status: None   Collection Time: 08/13/20 11:15 AM   Specimen: Nasopharyngeal Swab  Result Value Ref Range Status   SARS Coronavirus 2 NEGATIVE NEGATIVE Final    Comment: (NOTE) SARS-CoV-2 target nucleic acids are NOT DETECTED.  The SARS-CoV-2 RNA is generally detectable in upper and lower respiratory specimens during the acute phase of infection. Negative results do not preclude SARS-CoV-2 infection, do not rule out co-infections with other pathogens, and should not be used as the sole basis  for treatment or other patient management decisions. Negative results must be combined with clinical observations, patient history, and epidemiological information. The expected result is Negative.  Fact Sheet for Patients: SugarRoll.be  Fact Sheet for Healthcare Providers: https://www.woods-mathews.com/  This test is not yet approved or cleared by the [place] FDA and  has been authorized for detection and/or diagnosis of SARS-CoV-2 by FDA under an Emergency Use Authorization (EUA). This EUA will remain  in effect (meaning this test can be used) for the duration of the COVID-19 declaration under Se ction 564(b)(1) of the Act, 21 U.S.C. section 360bbb-3(b)(1), unless the authorization is terminated or revoked sooner.  Performed at [facility], [place] [address] [place], [place] [address]   Body fluid culture w Gram Stain     Status: None (Preliminary result)   Collection Time: 08/15/20 10:30 AM   Specimen: Pleura; Body Fluid  Result Value Ref Range Status   Specimen Description   Final    PLEURAL Performed at [facility], [address] [place], [place] [address]    Special Requests   Final    PLEURAL Performed at [facility], [address] [place], [place] [address]    Gram Stain   Final    FEW WBC PRESENT, PREDOMINANTLY MONONUCLEAR NO ORGANISMS SEEN    Culture   Final    NO GROWTH 2 DAYS Performed at [facility], [place] [address], [place],  [address]    Report Status PENDING  Incomplete  Resp Panel by RT-PCR (Flu A&B, Covid) Nasopharyngeal Swab     Status: None   Collection Time: 08/15/20  3:42 PM   Specimen: Nasopharyngeal Swab; Nasopharyngeal(NP) swabs in vial transport medium  Result Value Ref Range Status   SARS Coronavirus 2 by RT PCR NEGATIVE NEGATIVE Final    Comment: (NOTE) SARS-CoV-2 target nucleic acids are NOT DETECTED.  The SARS-CoV-2 RNA is generally detectable in  upper respiratory specimens during the acute phase of infection. The lowest concentration of SARS-CoV-2 viral copies this assay can detect is 138 copies/mL. A negative result does not preclude SARS-Cov-2 infection and should not be used as the sole basis for treatment or other patient management decisions. A negative result may occur with  improper specimen collection/handling, submission of specimen other than nasopharyngeal swab, presence of viral mutation(s) within the areas targeted by this assay, and inadequate number of viral copies(<138 copies/mL). A negative result must be combined with clinical observations, patient history, and epidemiological information. The expected result is Negative.  Fact Sheet for Patients:  EntrepreneurPulse.com.au  Fact Sheet for Healthcare Providers:  IncredibleEmployment.be  This test is no t yet approved or cleared by the [place]  FDA and  has been authorized for detection and/or diagnosis of SARS-CoV-2 by FDA under an Emergency Use Authorization (EUA). This EUA will remain  in effect (meaning this test can be used) for the duration of the COVID-19 declaration under Section 564(b)(1) of the Act, 21 U.S.C.section 360bbb-3(b)(1), unless the authorization is terminated  or revoked sooner.       Influenza A by PCR NEGATIVE NEGATIVE Final   Influenza B by PCR NEGATIVE NEGATIVE Final    Comment: (NOTE) The Xpert Xpress SARS-CoV-2/FLU/RSV plus assay is intended as an aid in the diagnosis of influenza from Nasopharyngeal swab specimens and should not be used as a sole basis for treatment. Nasal washings and aspirates are unacceptable for Xpert Xpress SARS-CoV-2/FLU/RSV testing.  Fact Sheet for Patients: EntrepreneurPulse.com.au  Fact Sheet for Healthcare Providers: IncredibleEmployment.be  This test is not yet approved or cleared by the [place] FDA and has been  authorized for detection and/or diagnosis of SARS-CoV-2 by FDA under an Emergency Use Authorization (EUA). This EUA will remain in effect (meaning this test can be used) for the duration of the COVID-19 declaration under Section 564(b)(1) of the Act, 21 U.S.C. section 360bbb-3(b)(1), unless the authorization is terminated or revoked.  Performed at [facility], [address] [place], [place] [address]   Culture, blood (Routine X 2) w Reflex to ID Panel     Status: None (Preliminary result)   Collection Time: 08/15/20  4:55 PM   Specimen: BLOOD  Result Value Ref Range Status   Specimen Description BLOOD BLOOD LEFT ARM  Final   Special Requests   Final    BOTTLES DRAWN AEROBIC AND ANAEROBIC Blood Culture adequate volume   Culture   Final    NO GROWTH 3 DAYS Performed at [facility], [address], [place], [place] [address]    Report Status PENDING  Incomplete  Culture, blood (Routine X 2) w Reflex to ID Panel     Status: None (Preliminary result)   Collection Time: 08/15/20  5:16 PM   Specimen: BLOOD  Result Value Ref Range Status   Specimen Description BLOOD BLOOD LEFT HAND  Final   Special Requests   Final    BOTTLES DRAWN AEROBIC AND ANAEROBIC Blood Culture adequate volume   Culture   Final    NO GROWTH 3 DAYS Performed at [facility], [address] [place], [place] [address]    Report Status PENDING  Incomplete         Radiology Studies: DG Chest 1 View  Result Date: 08/17/2020 CLINICAL DATA:  Acute respiratory failure EXAM: CHEST  1 VIEW COMPARISON:  08/15/2020 FINDINGS: The cardiomediastinal silhouette is unchanged. Airspace disease/consolidation within the LEFT mid-lower lung is slightly improved. Trace bilateral pleural effusions are now noted. New pulmonary vascular congestion is present. There is no evidence of pneumothorax. IMPRESSION: Slightly improved LEFT mid-lower lung airspace disease/consolidation with new pulmonary  vascular congestion and trace bilateral pleural effusions. Electronically Signed   By: Margarette Canada M.D.   On: 08/17/2020 11:19        Scheduled Meds: . albuterol  2 puff Inhalation Q6H  . allopurinol  300 mg Oral Daily  . carvedilol  25 mg Oral BID  . cholecalciferol  2,000 Units Oral Daily  . dextromethorphan-guaiFENesin  1 tablet Oral BID  . feeding supplement  237 mL Oral BID BM  . furosemide  40 mg Intravenous Once  . ipratropium  2 puff Inhalation Q6H  . levothyroxine  50  mcg Oral Q0600  . multivitamin with minerals  1 tablet Oral Daily  . pravastatin  40 mg Oral Daily  . sodium chloride  1 g Oral BID WC   Continuous Infusions: . azithromycin 500 mg (08/17/20 1828)  . cefTRIAXone (ROCEPHIN)  IV 1 g (08/17/20 1744)     LOS: 3 days    Time spent: 35 minutes    Jamina Macbeth A Ivo Moga, MD [facility]   If 7PM-7AM, please contact night-coverage [email or web address]  08/18/2020, 3:50 PM

## 2020-08-19 ENCOUNTER — Other Ambulatory Visit: Payer: Self-pay | Admitting: Hematology and Oncology

## 2020-08-19 DIAGNOSIS — J9601 Acute respiratory failure with hypoxia: Secondary | ICD-10-CM | POA: Diagnosis not present

## 2020-08-19 LAB — BASIC METABOLIC PANEL
Anion gap: 9 (ref 5–15)
BUN: 18 mg/dL (ref 8–23)
CO2: 27 mmol/L (ref 22–32)
Calcium: 9.3 mg/dL (ref 8.9–10.3)
Chloride: 98 mmol/L (ref 98–111)
Creatinine, Ser: 0.82 mg/dL (ref 0.44–1.00)
GFR, Estimated: >60 mL/min (ref >60)
Glucose, Bld: 117 mg/dL — ABNORMAL HIGH (ref 70–99)
Potassium: 3.8 mmol/L (ref 3.5–5.1)
Sodium: 134 mmol/L — ABNORMAL LOW (ref 135–145)

## 2020-08-19 LAB — CBC
HCT: 27.4 % — ABNORMAL LOW (ref 36.0–46.0)
Hemoglobin: 9.1 g/dL — ABNORMAL LOW (ref 12.0–15.0)
MCH: 30 pg (ref 26.0–34.0)
MCHC: 33.2 g/dL (ref 30.0–36.0)
MCV: 90.4 fL (ref 80.0–100.0)
Platelets: 64 10*3/uL — ABNORMAL LOW (ref 150–400)
RBC: 3.03 MIL/uL — ABNORMAL LOW (ref 3.87–5.11)
RDW: 18.1 % — ABNORMAL HIGH (ref 11.5–15.5)
WBC: 11.8 10*3/uL — ABNORMAL HIGH (ref 4.0–10.5)
nRBC: 0 % (ref 0.0–0.2)

## 2020-08-19 LAB — OCCULT BLOOD X 1 CARD TO LAB, STOOL: Fecal Occult Bld: NEGATIVE

## 2020-08-19 LAB — CYTOLOGY - NON PAP

## 2020-08-19 MED ORDER — FUROSEMIDE 10 MG/ML IJ SOLN
40.0000 mg | Freq: Two times a day (BID) | INTRAMUSCULAR | Status: AC
  Administered 2020-08-19 (×2): 40 mg via INTRAVENOUS
  Filled 2020-08-19 (×2): qty 4

## 2020-08-19 MED ORDER — POTASSIUM CHLORIDE CRYS ER 20 MEQ PO TBCR
40.0000 meq | EXTENDED_RELEASE_TABLET | Freq: Once | ORAL | Status: AC
  Administered 2020-08-19: 40 meq via ORAL
  Filled 2020-08-19: qty 2

## 2020-08-19 MED ORDER — SODIUM CHLORIDE 0.9 % IV SOLN
1.5000 g | Freq: Four times a day (QID) | INTRAVENOUS | Status: DC
  Administered 2020-08-19 – 2020-08-20 (×4): 1.5 g via INTRAVENOUS
  Filled 2020-08-19: qty 1.5
  Filled 2020-08-19 (×7): qty 4

## 2020-08-19 MED ORDER — SODIUM CHLORIDE 0.9 % IV SOLN
INTRAVENOUS | Status: DC | PRN
  Administered 2020-08-19: 30 mL via INTRAVENOUS

## 2020-08-19 NOTE — Progress Notes (Signed)
Occupational Therapy Treatment Patient Details Name: Natasha Farrell MRN: [id number] DOB: [birth year] Today's Date: 08/19/2020    History of present illness 75 y.o. female with medical history significant for hypertension, hyperlipidemia, hypothyroidism, gout, thrombocytopenia, extensive adenopathy, splenomegaly, pleural effusion, dCHF, who presents with SOB.  Had thorocentesis 3/25, >1.5L fluid.   OT comments  Upon entering the room, pt seated in recliner chair with husband present in the room. Pt is very pleasant and reports feeling so much better. Pt demonstrates ability to ambulate in room and performs toileting needs without assistance or use of AD. Pt's fatigue is much improved as well. Pt standing for 15 minutes to cut and arrange flowers at sink without LOB. OT discusses home set up and energy conservation for home with pt verbalizing understanding. Pt cleaning sink and counter without LOB and returning to recliner chair at end of session. Pt's recommendations changed to home without need for follow up intervention. Pt with no need for further OT intervention at this time. OT to SIGN OFF.   Follow Up Recommendations  No OT follow up    Equipment Recommendations  None recommended by OT       Precautions / Restrictions Precautions Precautions: None Restrictions Weight Bearing Restrictions: No       Mobility Bed Mobility               General bed mobility comments: in recliner on arrival, returned to recliner at end of session.    Transfers Overall transfer level: Independent Equipment used: None Transfers: Sit to/from Stand Sit to Stand: Independent              Balance Overall balance assessment: Independent Sitting-balance support: Feet supported Sitting balance-Leahy Scale: Normal     Standing balance support: During functional activity Standing balance-Leahy Scale: Good                             ADL either performed or assessed with  clinical judgement   ADL Overall ADL's : Modified independent                                             Vision Patient Visual Report: No change from baseline            Cognition Arousal/Alertness: Awake/alert Behavior During Therapy: WFL for tasks assessed/performed Overall Cognitive Status: Within Functional Limits for tasks assessed                                                     Pertinent Vitals/ Pain       Pain Assessment: No/denies pain      Progress Toward Goals  OT Goals(current goals can now be found in the care plan section)  Progress towards OT goals: Progressing toward goals;Goals met/education completed, patient discharged from OT  Acute Rehab OT Goals Patient Stated Goal: get better and go home OT Goal Formulation: With patient Time For Goal Achievement: 08/30/20 Potential to Achieve Goals: Good  Plan Discharge plan needs to be updated;All goals met and education completed, patient discharged from OT services       AM-PAC OT "6 Clicks" Daily Activity     Outcome  Measure   Help from another person eating meals?: None Help from another person taking care of personal grooming?: None Help from another person toileting, which includes using toliet, bedpan, or urinal?: None Help from another person bathing (including washing, rinsing, drying)?: None Help from another person to put on and taking off regular upper body clothing?: None Help from another person to put on and taking off regular lower body clothing?: None 6 Click Score: 24    End of Session    OT Visit Diagnosis: Unsteadiness on feet (R26.81);Muscle weakness (generalized) (M62.81)   Activity Tolerance Patient tolerated treatment well   Patient Left in chair;with call bell/phone within reach   Nurse Communication          Time: 4884-5733 OT Time Calculation (min): 24 min  Charges: OT General Charges $OT Visit: 1 Visit OT  Treatments $Self Care/Home Management : 8-22 mins $Therapeutic Activity: 8-22 mins  Darleen Crocker, MS, OTR/L , CBIS ascom [phone]  08/19/20, 4:25 PM

## 2020-08-19 NOTE — Progress Notes (Signed)
Mobility Specialist - Progress Note   08/19/20 1458  Mobility  Activity Off unit  Mobility performed by Mobility specialist    Pt ambulating hallway with PT at time of attempt. Will attempt session at another date/time.   Kathee Delton Mobility Specialist 08/19/20, 2:59 PM

## 2020-08-19 NOTE — Progress Notes (Addendum)
PROGRESS NOTE    Natasha Farrell  FAO:[id number] DOB: 11-Jul-1945 DOA: 08/15/2020 PCP: [name], MD   Brief Narrative: 75 year old with past medical history significant for hypertension, hyperlipidemia, hypothyroidism, gout, thrombocytopenia, extensive adenopathy and splenomegaly pleural effusion diastolic heart failure who presents complaining of shortness of breath.  Patient had left side thoracentesis and had 1.6 L of bloody pleural fluid removed by IR the morning of admission 3/25.  She develop shortness of breath, after the procedure which has been progressively worsening.  She also report her productive cough with yellow sputum. Patient was found to be in respiratory distress with oxygen saturation at 86 on room air which improved to 96% on 4 L. Patient reported that she was diagnosed with lymphoma by Dr. Mike Gip.    Assessment & Plan:   Principal Problem:   Acute respiratory failure with hypoxia (HCC) Active Problems:   Essential hypertension   Splenomegaly   Pleural effusion, left   Thrombocytopenia (HCC)   Hyperlipidemia   Chronic diastolic CHF (congestive heart failure) (HCC)   Hyponatremia   CAP (community acquired pneumonia)   Severe sepsis (HCC)  1-Acute Hypoxic Respiratory failure with hypoxia: This could be related to re-expansion pulmonary edema after thoracentesis and or community-acquired pneumonia.  Patient presented with leukocytosis and tachycardia and tachypnea. -Completed IV Rocephin and azithromycin for 5 days 3/29 -Continue with mucinex,  Flutter valve -Continue with bronchodilators -Repeated Chest x ray: 3-27:  showed new pulmonary vascular congestion, slightly improved left mid to lower lung airspace disease. -received IV lasix 3/27, 3/28. -Repeat IV lasix two doses, repeat chest x ray tomorrow. She will need to be discharge on oral lasix.  -will probably required Oxygen at discharge -repeat chest x ray 3/30  2-Severe Sepsis secondary to  community-acquired pneumonia: Exudative pleural effusion Patient presented with leukocytosis, tachycardia tachypnea.  Chest x-ray with findings consistent for pneumonia. Patient treated with IV antibiotics. -Completed Azithro and ceftriaxone for 5 days.  -Strep pneumonia negative.  Legionella negative -Procalcitonin 0.8, lactic acid 2.8 -Blood cultures no growth to date. -White blood cell down to 12 from 17 -ID consulted. 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  Dr Mike Gip will discuss Diagnosis with [facility]>    4-Hyponatremia; likely secondary to SIADH Sodium 88,, urine osmolarity 314. Nephrology consulted, order TSH panel and cortisol level not low.  Appreciate nephrology assistance. Patient was a started on sodium tablet, water restrictions.  Improved to 134.   Hypertension: Holding spironolactone due to hyponatremia.  Continue with Coreg.  Hold amlodipine to avoid hypotension.  Left-sided pleural effusion: Status post thoracentesis 3/25. Exudative effusion.  Pleural fluid analysis: LDH pleural 128, total protein 5.4, glucose 98.   LDH serum; 182 Lights criteria consistent with Exudative effusion, Pleural fluid Culture no growth to date.  Continue with IV antibiotics.  Needs to follow Cytology;  ID consulted.   Thrombocytopenia: Follow trend, improved.  Hyperlipidemia continue with pravastatin Acute on Chronic diastolic heart failure: Holding spironolactone due to sepsis and hyponatremia. IV lasix times 2 today--She will need oral lasix.   Melena, Anemia;  Occult blood. negative Monitor hb.,  Discontinue DVT prophylaxis.  IV protonix.  Reported brown stool today  Estimated body mass index is 28.1 kg/m as calculated from the following:   Height as of this encounter: 5\' 4"  (1.626 m).   Weight as of this encounter: 74.3 kg.   DVT prophylaxis: SCD Due  to thrombocytopenia Code Status: Full code Family Communication: Care discussed with patient Disposition Plan:  Status is: Inpatient  Remains inpatient appropriate because:IV treatments appropriate due to intensity of illness or inability to take PO   Dispo: The patient is from: Home              Anticipated d/c is to: Home              Patient currently is not medically stable to d/c. Follow up chest x ray 3/30, awaiting improvement resp failure.    Difficult to place patient No        Consultants:   Oncology  Nephrology  Procedures:   none  Antimicrobials:    Subjective: She denies abdominal pain. She is breathing better over all.  Still coughing.   Objective: Vitals:   08/18/20 1702 08/18/20 2023 08/19/20 0512 08/19/20 0810  BP: 135/67 (!) 136/55 (!) 128/57 121/65  Pulse: (!) 113 100 92 94  Resp:  18 18   Temp: 98.6 F (37 C) 98 F (36.7 C) 98 F (36.7 C) 97.7 F (36.5 C)  TempSrc: Oral Oral Oral Oral  SpO2: 100% 95% 100% 100%  Weight:   74.4 kg 74.3 kg  Height:        Intake/Output Summary (Last 24 hours) at 08/19/2020 0926 Last data filed at 08/18/2020 1805 Gross per 24 hour  Intake 720 ml  Output 400 ml  Net 320 ml   Filed Weights   08/18/20 0715 08/19/20 0512 08/19/20 0810  Weight: 74.6 kg 74.4 kg 74.3 kg    Examination:  General exam: NAD Respiratory system: BL crackles.  Cardiovascular system: S 1, S 2 RRR Gastrointestinal system: BS present, soft, nt  Central nervous system: Alert, following command Extremities: Symmetric power    Data Reviewed: I have personally reviewed following labs and imaging studies  CBC: Recent Labs  Lab 08/14/20 1519 08/15/20 1342 08/16/20 0739 08/17/20 0812 08/18/20 0818 08/19/20 0530  WBC 14.9* 16.9* 17.0* 13.4* 12.8* 11.8*  NEUTROABS 10.6* 11.7*  --   --   --   --   HGB 10.5* 13.1 11.4* 10.0* 10.1* 9.1*  HCT 30.3* 39.0 33.0* 28.4* 30.0* 27.4*  MCV 84.6 88.6 87.5 85.8 88.5 90.4  PLT 41*  44* 45* 51* 62* 64*   Basic Metabolic Panel: Recent Labs  Lab 08/15/20 2320 08/16/20 0739 08/17/20 0812 08/18/20 0818 08/19/20 0530  NA 122* 124* 126* 130* 134*  K 4.2 4.3 3.8 4.2 3.8  CL 88* 89* 92* 94* 98  CO2 22 23 24 25 27   GLUCOSE 119* 106* 119* 115* 117*  BUN 23 19 16 16 18   CREATININE 0.90 0.81 0.71 0.80 0.82  CALCIUM 8.4* 8.5* 8.5* 9.2 9.3   GFR: Estimated Creatinine Clearance: 61.2 mL/min (by C-G formula based on SCr of 0.82 mg/dL). Liver Function Tests: Recent Labs  Lab 08/15/20 1448  AST 31  ALT 14  ALKPHOS 92  BILITOT 1.7*  PROT 7.7  ALBUMIN 3.7   No results for input(s): LIPASE, AMYLASE in the last 168 hours. No results for input(s): AMMONIA in the last 168 hours. Coagulation Profile: No results for input(s): INR, PROTIME in the last 168 hours. Cardiac Enzymes: No results for input(s): CKTOTAL, CKMB, CKMBINDEX, TROPONINI in the last 168 hours. BNP (last 3 results) No results for input(s): PROBNP in the last 8760 hours. HbA1C: No results for input(s): HGBA1C in the last 72 hours. CBG: No results for input(s): GLUCAP in the last 168 hours.  Lipid Profile: No results for input(s): CHOL, HDL, LDLCALC, TRIG, CHOLHDL, LDLDIRECT in the last 72 hours. Thyroid Function Tests: No results for input(s): TSH, T4TOTAL, FREET4, T3FREE, THYROIDAB in the last 72 hours. Anemia Panel: No results for input(s): VITAMINB12, FOLATE, FERRITIN, TIBC, IRON, RETICCTPCT in the last 72 hours. Sepsis Labs: Recent Labs  Lab 08/15/20 1715 08/15/20 1826  PROCALCITON 0.81  --   LATICACIDVEN 2.8* 2.6*    Recent Results (from the past 240 hour(s))  SARS CORONAVIRUS 2 (TAT 6-24 HRS) Nasopharyngeal Nasopharyngeal Swab     Status: None   Collection Time: 08/13/20 11:15 AM   Specimen: Nasopharyngeal Swab  Result Value Ref Range Status   SARS Coronavirus 2 NEGATIVE NEGATIVE Final    Comment: (NOTE) SARS-CoV-2 target nucleic acids are NOT DETECTED.  The SARS-CoV-2 RNA is  generally detectable in upper and lower respiratory specimens during the acute phase of infection. Negative results do not preclude SARS-CoV-2 infection, do not rule out co-infections with other pathogens, and should not be used as the sole basis for treatment or other patient management decisions. Negative results must be combined with clinical observations, patient history, and epidemiological information. The expected result is Negative.  Fact Sheet for Patients: SugarRoll.be  Fact Sheet for Healthcare Providers: https://www.woods-mathews.com/  This test is not yet approved or cleared by the [place] FDA and  has been authorized for detection and/or diagnosis of SARS-CoV-2 by FDA under an Emergency Use Authorization (EUA). This EUA will remain  in effect (meaning this test can be used) for the duration of the COVID-19 declaration under Se ction 564(b)(1) of the Act, 21 U.S.C. section 360bbb-3(b)(1), unless the authorization is terminated or revoked sooner.  Performed at [facility], [place] [address] [place], [place] [address]   Body fluid culture w Gram Stain     Status: None (Preliminary result)   Collection Time: 08/15/20 10:30 AM   Specimen: Pleura; Body Fluid  Result Value Ref Range Status   Specimen Description   Final    PLEURAL Performed at [facility], [address] [place], [place] [address]    Special Requests   Final    PLEURAL Performed at [facility], [address] [place], [place] [address]    Gram Stain   Final    FEW WBC PRESENT, PREDOMINANTLY MONONUCLEAR NO ORGANISMS SEEN    Culture   Final    NO GROWTH 2 DAYS Performed at [facility], [place] [address], [place], [place] [address]    Report Status PENDING  Incomplete  Resp Panel by RT-PCR (Flu A&B, Covid) Nasopharyngeal Swab     Status: None   Collection Time: 08/15/20  3:42 PM   Specimen: Nasopharyngeal Swab;  Nasopharyngeal(NP) swabs in vial transport medium  Result Value Ref Range Status   SARS Coronavirus 2 by RT PCR NEGATIVE NEGATIVE Final    Comment: (NOTE) SARS-CoV-2 target nucleic acids are NOT DETECTED.  The SARS-CoV-2 RNA is generally detectable in upper respiratory specimens during the acute phase of infection. The lowest concentration of SARS-CoV-2 viral copies this assay can detect is 138 copies/mL. A negative result does not preclude SARS-Cov-2 infection and should not be used as the sole basis for treatment or other patient management decisions. A negative result may occur with  improper specimen collection/handling, submission of specimen other than nasopharyngeal swab, presence of viral mutation(s) within the areas targeted by this assay, and inadequate number of viral copies(<138 copies/mL). A negative result must be combined  with clinical observations, patient history, and epidemiological information. The expected result is Negative.  Fact Sheet for Patients:  EntrepreneurPulse.com.au  Fact Sheet for Healthcare Providers:  IncredibleEmployment.be  This test is no t yet approved or cleared by the [place] FDA and  has been authorized for detection and/or diagnosis of SARS-CoV-2 by FDA under an Emergency Use Authorization (EUA). This EUA will remain  in effect (meaning this test can be used) for the duration of the COVID-19 declaration under Section 564(b)(1) of the Act, 21 U.S.C.section 360bbb-3(b)(1), unless the authorization is terminated  or revoked sooner.       Influenza A by PCR NEGATIVE NEGATIVE Final   Influenza B by PCR NEGATIVE NEGATIVE Final    Comment: (NOTE) The Xpert Xpress SARS-CoV-2/FLU/RSV plus assay is intended as an aid in the diagnosis of influenza from Nasopharyngeal swab specimens and should not be used as a sole basis for treatment. Nasal washings and aspirates are unacceptable for Xpert Xpress  SARS-CoV-2/FLU/RSV testing.  Fact Sheet for Patients: EntrepreneurPulse.com.au  Fact Sheet for Healthcare Providers: IncredibleEmployment.be  This test is not yet approved or cleared by the [place] FDA and has been authorized for detection and/or diagnosis of SARS-CoV-2 by FDA under an Emergency Use Authorization (EUA). This EUA will remain in effect (meaning this test can be used) for the duration of the COVID-19 declaration under Section 564(b)(1) of the Act, 21 U.S.C. section 360bbb-3(b)(1), unless the authorization is terminated or revoked.  Performed at [facility], [place] [place], [place] [address]   Culture, blood (Routine X 2) w Reflex to ID Panel     Status: None (Preliminary result)   Collection Time: 08/15/20  4:55 PM   Specimen: BLOOD  Result Value Ref Range Status   Specimen Description BLOOD BLOOD LEFT ARM  Final   Special Requests   Final    BOTTLES DRAWN AEROBIC AND ANAEROBIC Blood Culture adequate volume   Culture   Final    NO GROWTH 4 DAYS Performed at [facility], [address] [place], [place] [address]    Report Status PENDING  Incomplete  Culture, blood (Routine X 2) w Reflex to ID Panel     Status: None (Preliminary result)   Collection Time: 08/15/20  5:16 PM   Specimen: BLOOD  Result Value Ref Range Status   Specimen Description BLOOD BLOOD LEFT HAND  Final   Special Requests   Final    BOTTLES DRAWN AEROBIC AND ANAEROBIC Blood Culture adequate volume   Culture   Final    NO GROWTH 4 DAYS Performed at [facility], [address] [place], [place] [address]    Report Status PENDING  Incomplete  MRSA PCR Screening     Status: None   Collection Time: 08/18/20 12:35 PM   Specimen: Nasal Mucosa; Nasopharyngeal  Result Value Ref Range Status   MRSA by PCR NEGATIVE NEGATIVE Final    Comment:        The GeneXpert MRSA Assay (FDA approved for NASAL specimens only),  is one component of a comprehensive MRSA colonization surveillance program. It is not intended to diagnose MRSA infection nor to guide or monitor treatment for MRSA infections. Performed at [facility], [address] [place], [place] [address]          Radiology Studies: DG Chest 1 View  Result Date: 08/17/2020 CLINICAL DATA:  Acute respiratory failure EXAM: CHEST  1 VIEW COMPARISON:  08/15/2020 FINDINGS: The cardiomediastinal silhouette is unchanged.  Airspace disease/consolidation within the LEFT mid-lower lung is slightly improved. Trace bilateral pleural effusions are now noted. New pulmonary vascular congestion is present. There is no evidence of pneumothorax. IMPRESSION: Slightly improved LEFT mid-lower lung airspace disease/consolidation with new pulmonary vascular congestion and trace bilateral pleural effusions. Electronically Signed   By: Margarette Canada M.D.   On: 08/17/2020 11:19        Scheduled Meds: . albuterol  2 puff Inhalation Q6H  . allopurinol  300 mg Oral Daily  . carvedilol  25 mg Oral BID  . cholecalciferol  2,000 Units Oral Daily  . dextromethorphan-guaiFENesin  1 tablet Oral BID  . feeding supplement  237 mL Oral BID BM  . furosemide  40 mg Intravenous Q12H  . ipratropium  2 puff Inhalation Q6H  . levothyroxine  50 mcg Oral Q0600  . multivitamin with minerals  1 tablet Oral Daily  . pantoprazole (PROTONIX) IV  40 mg Intravenous Q12H  . potassium chloride  40 mEq Oral Once  . pravastatin  40 mg Oral Daily  . sodium chloride  1 g Oral BID WC   Continuous Infusions: . azithromycin 500 mg (08/18/20 1803)  . cefTRIAXone (ROCEPHIN)  IV 1 g (08/18/20 1725)     LOS: 4 days    Time spent: 35 minutes    Cortez Steelman A Carleigh Buccieri, MD [facility]   If 7PM-7AM, please contact night-coverage [email or web address]  08/19/2020, 9:26 AM

## 2020-08-19 NOTE — Progress Notes (Signed)
Physical Therapy Treatment Patient Details Name: Natasha Farrell MRN: [id number] DOB: [birth year] Today's Date: 08/19/2020    History of Present Illness 75 y.o. female with medical history significant for hypertension, hyperlipidemia, hypothyroidism, gout, thrombocytopenia, extensive adenopathy, splenomegaly, pleural effusion, dCHF, who presents with SOB.  Had thorocentesis 3/25, >1.5L fluid.    PT Comments    Pt in recliner, husband at bedside. Pt feels 'good' today, O2 donned at entry, but doffed for AMB and sats remain >/= 96% throughout, HR steadily in 120s, but without any CP, SOB, dizziness. Pt reports she is planned to see a cardiologist in future as an outpatient due to rate elevation but has been told that the elevation may be 2/2 CA status. Pt has been AMB ad lib in room daily since PT eval 3d prior, is desperate to AMB out of room, but MD was no confident about this earlier today. Author updated RN on pt AMB >1060ft without device, without O2, VSS, excellent tolerance. RN gives clearance for AMB on unit ad lib. Will continue to follow, but suspect pt can be made independence for her physical activity after today's PT session.     Follow Up Recommendations  No PT follow up     Equipment Recommendations  None recommended by PT    Recommendations for Other Services       Precautions / Restrictions Precautions Precautions: None Restrictions Weight Bearing Restrictions: No    Mobility  Bed Mobility               General bed mobility comments: in recliner on arrival, returned to recliner post ambulation    Transfers Overall transfer level: Independent   Transfers: Sit to/from Stand Sit to Stand: Independent            Ambulation/Gait   Gait Distance (Feet): 1297 Feet Assistive device: None Gait Pattern/deviations: WFL(Within Functional Limits) Gait velocity: 1.52m/s Gait velocity interpretation: >2.62 ft/sec, indicative of community ambulatory General  Gait Details: VSS, rates regular and elevated in 120s througout AMB interval, CP, no dizziness, no SOB.   Stairs             Wheelchair Mobility    Modified Rankin (Stroke Patients Only)       Balance Overall balance assessment: Independent                                          Cognition Arousal/Alertness: Awake/alert Behavior During Therapy: WFL for tasks assessed/performed Overall Cognitive Status: Within Functional Limits for tasks assessed                                        Exercises      General Comments        Pertinent Vitals/Pain Pain Assessment: No/denies pain    Home Living                      Prior Function            PT Goals (current goals can now be found in the care plan section) Acute Rehab PT Goals Patient Stated Goal: get better and go home PT Goal Formulation: With patient Time For Goal Achievement: 08/30/20 Potential to Achieve Goals: Good Progress towards PT goals: Progressing toward goals    Frequency  Min 1X/week      PT Plan Current plan remains appropriate;Frequency needs to be updated    Co-evaluation              AM-PAC PT "6 Clicks" Mobility   Outcome Measure  Help needed turning from your back to your side while in a flat bed without using bedrails?: None Help needed moving from lying on your back to sitting on the side of a flat bed without using bedrails?: None Help needed moving to and from a bed to a chair (including a wheelchair)?: None Help needed standing up from a chair using your arms (e.g., wheelchair or bedside chair)?: None Help needed to walk in hospital room?: None Help needed climbing 3-5 steps with a railing? : None 6 Click Score: 24    End of Session Equipment Utilized During Treatment: Gait belt Activity Tolerance: Patient tolerated treatment well;No increased pain Patient left: in chair;with call bell/phone within reach;with  family/visitor present Nurse Communication: Mobility status PT Visit Diagnosis: Muscle weakness (generalized) (M62.81);Difficulty in walking, not elsewhere classified (R26.2)     Time: 7121-9758 PT Time Calculation (min) (ACUTE ONLY): 15 min  Charges:  $Therapeutic Exercise: 8-22 mins                3:00 PM, 08/19/20 Etta Grandchild, PT, DPT Physical Therapist - [facility]  [phone] ([place])     De[place]id[place] C 08/19/2020, 2:55 PM

## 2020-08-19 NOTE — Plan of Care (Signed)
  Problem: Health Behavior/Discharge Planning: Goal: Ability to manage health-related needs will improve Outcome: Progressing   Problem: Clinical Measurements: Goal: Will remain free from infection Outcome: Progressing Goal: Cardiovascular complication will be avoided Outcome: Progressing   

## 2020-08-20 ENCOUNTER — Inpatient Hospital Stay: Payer: Medicare HMO

## 2020-08-20 LAB — CBC
HCT: 26.7 % — ABNORMAL LOW (ref 36.0–46.0)
Hemoglobin: 8.9 g/dL — ABNORMAL LOW (ref 12.0–15.0)
MCH: 30.7 pg (ref 26.0–34.0)
MCHC: 33.3 g/dL (ref 30.0–36.0)
MCV: 92.1 fL (ref 80.0–100.0)
Platelets: 65 10*3/uL — ABNORMAL LOW (ref 150–400)
RBC: 2.9 MIL/uL — ABNORMAL LOW (ref 3.87–5.11)
RDW: 18.7 % — ABNORMAL HIGH (ref 11.5–15.5)
WBC: 10.2 10*3/uL (ref 4.0–10.5)
nRBC: 0.2 % (ref 0.0–0.2)

## 2020-08-20 LAB — RETICULOCYTES
Immature Retic Fract: 38.6 % — ABNORMAL HIGH (ref 2.3–15.9)
RBC.: 2.99 MIL/uL — ABNORMAL LOW (ref 3.87–5.11)
Retic Count, Absolute: 175.8 10*3/uL (ref 19.0–186.0)
Retic Ct Pct: 5.9 % — ABNORMAL HIGH (ref 0.4–3.1)

## 2020-08-20 LAB — CULTURE, BLOOD (ROUTINE X 2)
Culture: NO GROWTH
Special Requests: ADEQUATE

## 2020-08-20 LAB — BODY FLUID CULTURE W GRAM STAIN: Culture: NO GROWTH

## 2020-08-20 LAB — BASIC METABOLIC PANEL
Anion gap: 10 (ref 5–15)
BUN: 18 mg/dL (ref 8–23)
CO2: 27 mmol/L (ref 22–32)
Calcium: 9.2 mg/dL (ref 8.9–10.3)
Chloride: 98 mmol/L (ref 98–111)
Creatinine, Ser: 0.87 mg/dL (ref 0.44–1.00)
GFR, Estimated: >60 mL/min (ref >60)
Glucose, Bld: 124 mg/dL — ABNORMAL HIGH (ref 70–99)
Potassium: 3.8 mmol/L (ref 3.5–5.1)
Sodium: 135 mmol/L (ref 135–145)

## 2020-08-20 LAB — VITAMIN B12: Vitamin B-12: 206 pg/mL (ref 180–914)

## 2020-08-20 LAB — SURGICAL PATHOLOGY

## 2020-08-20 LAB — PROCALCITONIN: Procalcitonin: 0.21 ng/mL

## 2020-08-20 LAB — THYROID PANEL WITH TSH
Free Thyroxine Index: 3.5 (ref 1.2–4.9)
T3 Uptake Ratio: 34 % (ref 24–39)
T4, Total: 10.2 ug/dL (ref 4.5–12.0)
TSH: 3.89 u[IU]/mL (ref 0.450–4.500)

## 2020-08-20 LAB — IRON AND TIBC
Iron: 81 ug/dL (ref 28–170)
Saturation Ratios: 25 % (ref 10.4–31.8)
TIBC: 330 ug/dL (ref 250–450)
UIBC: 249 ug/dL

## 2020-08-20 LAB — FOLATE: Folate: 5.1 ng/mL — ABNORMAL LOW (ref >5.9)

## 2020-08-20 LAB — FERRITIN: Ferritin: 271 ng/mL (ref 11–307)

## 2020-08-20 MED ORDER — ENSURE ENLIVE PO LIQD: 237.0000 mL | Freq: Two times a day (BID) | ORAL | 12 refills | Status: DC

## 2020-08-20 MED ORDER — ADULT MULTIVITAMIN W/MINERALS CH: 1.0000 | ORAL_TABLET | Freq: Every day | ORAL | Status: AC

## 2020-08-20 MED ORDER — SODIUM CHLORIDE 1 G PO TABS
1.0000 g | ORAL_TABLET | Freq: Two times a day (BID) | ORAL | 0 refills | Status: AC
Start: 2020-08-20 — End: 2020-09-19

## 2020-08-20 NOTE — Progress Notes (Signed)
Mobility Specialist - Progress Note   08/20/20 1200  Mobility  Activity Ambulated in hall  Level of Assistance Independent  Assistive Device None  Distance Ambulated (ft) 1000 ft  Mobility Response Tolerated well  Mobility performed by Mobility specialist  $Mobility charge 1 Mobility    Pre-mobility: 111 HR, 97% SpO2 During mobility: 120 HR, 99% SpO2 Post-mobility: 111 HR   Pt ambulated in hallway without AD. No LOB/SOB/dizziness. Independent. Motivated to ambulate later this afternoon.    Kathee Delton Mobility Specialist 08/20/20, 12:27 PM

## 2020-08-20 NOTE — Discharge Summary (Addendum)
Physician Discharge Summary   Natasha Farrell  female DOB: [birth year]  NMM:[id number]  PCP: [name], MD  Admit date: 08/15/2020 Discharge date: 08/20/2020  Admitted From: home Disposition:  home CODE STATUS: Full code  Discharge Instructions    Discharge instructions   Complete by: As directed    You have finished treatment of pneumonia with IV antibiotic, and currently breathing well on room air, so you can go home to recover.  You have low sodium level, and nephrology has started you on salt tablets.  Please take them as directed.     Dr. Enzo Bi - -   No wound care   Complete by: As directed       30 Day Unplanned Readmission Risk Score   Flowsheet Row ED to Hosp-Admission (Current) from 08/15/2020 in [facility]  30 Day Unplanned Readmission Risk Score (%) 12.89 Filed at 08/20/2020 1200     This score is the patient's risk of an unplanned readmission within 30 days of being discharged (0 -100%). The score is based on dignosis, age, lab data, medications, orders, and past utilization.   Low:  0-14.9   Medium: 15-21.9   High: 22-29.9   Extreme: 30 and above         Hospital Course:  For full details, please see H&P, progress notes, consult notes and ancillary notes.  Briefly,  Natasha Farrell is a 75 year old with past medical history significant for hypertension, hyperlipidemia, hypothyroidism, gout, thrombocytopenia, extensive adenopathy and splenomegaly pleural effusion diastolic heart failure who presented complaining of shortness of breath.    Patient had left side thoracentesis and had 1.6 L of bloody pleural fluid removed by IR the morning of admission 3/25.  She developed shortness of breath after the procedure.  Patient was found to be in respiratory distress with oxygen saturation at 86 on room air which improved to 96% on 4 L. Patient reported that she was diagnosed with lymphoma by Dr. Mike Gip.   Acute Hypoxic  Respiratory failure with hypoxia: This could be related to re-expansion pulmonary edema after thoracentesis and or community-acquired pneumonia.  -received IV lasix 3/27, 3/28. Pt was weaned down to room air prior to discharge.  Severe Sepsis secondary to community-acquired pneumonia Exudative pleural effusion Patient presented with leukocytosis, tachycardia tachypnea.  Chest x-ray with findings consistent for pneumonia.  Procalcitonin 0.8, lactic acid 2.8.  Strep pneumonia negative.  Legionella negative -Completed Azithro and ceftriaxone for 5 days.   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  Hyponatremia; likely secondary to SIADH Na 122 on presentation.  Urine sodium 88, urine osmolarity 314. Nephrology consulted.  Patient was a started on sodium tablet, water restrictions.  Na improved to 134.  Hypertension:  Continue with Coreg and spironolactone.  amlodipine d/c'ed to avoid hypotension.  Left-sided pleural effusion:  Status post thoracentesis 3/25. Exudative effusion.  Pleural fluid analysis: LDH pleural 128, total protein 5.4, glucose 98.   LDH serum; 182 Lights criteria consistent with Exudative effusion, Pleural fluid Culture no growth to date.  Pt completed treatment for pneumonia.    Thrombocytopenia, improved  Hyperlipidemia  continue with pravastatin  Chronic diastolic heart failure spironolactone initially held due to sepsis and hyponatremia, resumed at discharge.  Anemia Occult blood. negative.  Anemia workup added, to be followed up as outpatient.   Estimated body mass index is 28.1 kg/m as calculated from the following:   Height  as of this encounter: 5' 4"  (1.626 m).   Weight as of this encounter: 74.3 kg.   Discharge Diagnoses:  Principal Problem:   Acute respiratory failure with hypoxia (HCC) Active Problems:   Essential hypertension   Splenomegaly    Pleural effusion, left   Thrombocytopenia (HCC)   Hyperlipidemia   Chronic diastolic CHF (congestive heart failure) (HCC)   Hyponatremia   CAP (community acquired pneumonia)   Severe sepsis [facility])    Discharge Instructions:  Allergies as of 08/20/2020      Reactions   Lisinopril Swelling   angioedema      Medication List    STOP taking these medications   amLODipine 10 MG tablet Commonly known as: NORVASC     TAKE these medications   acetaminophen 500 MG tablet Commonly known as: TYLENOL Take 500 mg by mouth every 6 (six) hours as needed for moderate pain, fever or headache.   allopurinol 300 MG tablet Commonly known as: ZYLOPRIM Take 1 tablet (300 mg total) by mouth daily.   carvedilol 25 MG tablet Commonly known as: COREG Take 1 tablet (25 mg total) by mouth 2 (two) times daily.   cholecalciferol 25 MCG (1000 UNIT) tablet Commonly known as: VITAMIN D3 Take 2,000 Units by mouth daily.   feeding supplement Liqd Take 237 mLs by mouth 2 (two) times daily between meals.   loratadine 10 MG tablet Commonly known as: CLARITIN Take 10 mg by mouth daily as needed for allergies.   multivitamin with minerals Tabs tablet Take 1 tablet by mouth daily. Start taking on: August 21, 2020   pravastatin 40 MG tablet Commonly known as: PRAVACHOL Take 40 mg by mouth daily.   sodium chloride 1 g tablet Take 1 tablet (1 g total) by mouth 2 (two) times daily with a meal.   spironolactone 50 MG tablet Commonly known as: ALDACTONE Take 50 mg by mouth daily.   Synthroid 50 MCG tablet Generic drug: levothyroxine Take 1 tablet (50 mcg total) by mouth daily.        Follow-up Information    [name], MD. Schedule an appointment as soon as possible for a visit in 1 week(s).   Specialty: Physician Assistant Contact information: [address] [id number] [phone]        Nelva Bush, MD .   Specialty:  Cardiology Contact information: [address] [place] [address] [phone]               Allergies  Allergen Reactions  . Lisinopril Swelling    angioedema     The results of significant diagnostics from this hospitalization (including imaging, microbiology, ancillary and laboratory) are listed below for reference.   Consultations:   Procedures/Studies: DG Chest 1 View  Result Date: 08/17/2020 CLINICAL DATA:  Acute respiratory failure EXAM: CHEST  1 VIEW COMPARISON:  08/15/2020 FINDINGS: The cardiomediastinal silhouette is unchanged. Airspace disease/consolidation within the LEFT mid-lower lung is slightly improved. Trace bilateral pleural effusions are now noted. New pulmonary vascular congestion is present. There is no evidence of pneumothorax. IMPRESSION: Slightly improved LEFT mid-lower lung airspace disease/consolidation with new pulmonary vascular congestion and trace bilateral pleural effusions. Electronically Signed   By: Margarette Canada M.D.   On: 08/17/2020 11:19   DG Chest 2 View  Result Date: 08/20/2020 CLINICAL DATA:  Respiratory failure.  Prior left thoracentesis. EXAM: CHEST - 2 VIEW COMPARISON:  Chest x-ray 08/17/2020.  PET-CT 08/12/2020. FINDINGS: Mediastinum and hilar  structures are stable. Heart size stable. Interim near complete resolution of left mid lung infiltrate. Small residual left pleural effusion. Tiny right pleural effusion again noted. Questionable lucency noted over the left base on PA view and anterior chest on lateral view. To completely exclude a pneumothorax on the left a right-side-down decubitus chest x-ray is suggested. No acute bony abnormality. Surgical clips right neck. IMPRESSION: 1. Interim near complete resolution of left mid lung infiltrate. Mild residual left pleural effusion. Tiny right pleural effusion also again noted. 2. Questionable lucency noted over the left base on PA view and anterior chest on lateral view. To  completely exclude a pneumothorax on the left a right-side-down decubitus chest x-ray suggested. These results will be called to the ordering clinician or representative by the Radiologist Assistant, and communication documented in the PACS or [employer]. Electronically Signed   By: Marcello Moores  Register   On: 08/20/2020 11:37   CT Angio Chest PE W/Cm &/Or Wo Cm  Result Date: 07/21/2020 CLINICAL DATA:  Generalized weakness, loss of appetite, chest pain with exertion EXAM: CT ANGIOGRAPHY CHEST WITH CONTRAST TECHNIQUE: Multidetector CT imaging of the chest was performed using the standard protocol during bolus administration of intravenous contrast. Multiplanar CT image reconstructions and MIPs were obtained to evaluate the vascular anatomy. CONTRAST:  11m OMNIPAQUE IOHEXOL 350 MG/ML SOLN COMPARISON:  None. FINDINGS: Cardiovascular: This is a technically adequate evaluation of the pulmonary vasculature. There are no filling defects or pulmonary emboli. The heart is unremarkable without pericardial effusion. Unremarkable thoracic aorta without aneurysm or dissection. Minimal atherosclerosis. Mediastinum/Nodes: There is pathologic adenopathy within the bilateral jugular chains, mediastinum, bilateral axilla. Largest lymph node in the right axilla measures 21 x 16 mm image 24/4, and in the left axilla measures 30 x 15 mm image 11/4. Enlarged lymph node interposed between the brachiocephalic vein and left subclavian artery measures 21 x 16 mm reference image 13 of series 4. The thyroid, trachea, and esophagus are unremarkable. Lungs/Pleura: There is a small left pleural effusion. Minimal dependent atelectasis is seen within the lower lobes bilaterally, left greater than right. No acute airspace disease or pneumothorax. The central airways are patent. Upper Abdomen: There is massive splenomegaly. Pathologic adenopathy is seen within the central upper mesentery and retroperitoneum, incompletely evaluated on this  study. Epiphrenic adenopathy also noted anterior to the bilateral hemidiaphragms. Musculoskeletal: No acute or destructive bony lesions. Reconstructed images demonstrate no additional findings. Review of the MIP images confirms the above findings. IMPRESSION: 1. Extensive lymphadenopathy throughout the neck, chest, and visualized portions of the abdomen, along with marked splenomegaly. Overall, findings favor leukemia or lymphoma. 2. No evidence of pulmonary embolus. 3. Small left pleural effusion. 4.  Aortic Atherosclerosis (ICD10-I70.0). Electronically Signed   By: MRanda NgoM.D.   On: 07/21/2020 15:25   NM PET Image Initial (PI) Skull Base To Thigh  Result Date: 08/12/2020 CLINICAL DATA:  Initial treatment strategy for adenopathy, 75year old female with suspected lymphoma. EXAM: NUCLEAR MEDICINE PET SKULL BASE TO THIGH TECHNIQUE: 9.19 mCi F-18 FDG was injected intravenously. Full-ring PET imaging was performed from the skull base to thigh after the radiotracer. CT data was obtained and used for attenuation correction and anatomic localization. Fasting blood glucose: 109 mg/dl COMPARISON:  CT angiography of the chest from February 2022. FINDINGS: Mediastinal blood pool activity: SUV max 1.22 Liver activity: SUV max 4.67 (liver is markedly heterogeneous and appears to be involved in the LEFT lobe. SUV measured in the RIGHT lobe but way from areas of  marked increased metabolic activity. NECK: Multiple enlarged lymph nodes in the neck with hypermetabolic features. Lymphoid tissue with a maximum SUV of 9.6 in Waldeyer's ring. Lingual tonsils also with increased metabolic activity. LEFT neck adenopathy, (image 37, series 4) measuring approximately 2.6 by 3.6 cm with a maximum SUV of 14.1. RIGHT neck adenopathy with similar metabolic activity measuring approximately 2.7 cm with respect to conglomerate at level II. LEFT thoracic inlet lymphadenopathy (image 60, series 4) approximately 13 mm short axis with a  maximum SUV of 11. Similar activity in RIGHT neck lymph nodes. Smaller posterior triangle lymph nodes, for instance on image 43 of series 4 displaying maximum SUV of 7.5 which short axis dimension 10 1 cm. Incidental CT findings: Bulky adenopathy bilaterally. Airways patent. CHEST: Superior mediastinal lymph node (image 70, series 4) 16 mm short axis with a maximum SUV of 12. Bulky bilateral axillary adenopathy. (Image 69 of series 4 1.8 cm short axis bulky RIGHT axillary lymph node with a maximum SUV of 12.3 Subpectoral and RIGHT thoracic inlet adenopathy with similar metabolic activity though smaller size. Internal mammary adenopathy also with increased metabolic activity along with LEFT axillary adenopathy. Bilateral hilar lymph nodes with increased metabolic activity. Soft tissue along the paraspinous regions bilaterally, for instance on image 86 of series 4 measuring 11 mm short axis with a maximum SUV of 8.7 Bilateral paraspinal uptake tracking towards the lower chest with similar appearance. Incidental CT findings: Calcified atheromatous plaque of the thoracic aorta. No aneurysmal dilation. Large LEFT-sided effusion. Small RIGHT-sided effusion. Large effusion peer Jeannine Kitten more than 3/4 of the chest. Basilar collapse bilaterally worse on the LEFT. ABDOMEN/PELVIS: Diffuse LEFT hepatic involvement involving the entire lateral and medial segment of the LEFT hepatic lobe with a maximum SUV of 9.5. Other areas of scattered increased metabolic activity in addition to confluent metabolic activity that extends into the RIGHT hepatic lobe, hepatic subsegment V. Scattered areas in hepatic subsegment VII, VI and VIII. Splenomegaly with a maximum SUV, diffuse intense activity of 11.5, maximum craniocaudal dimension approximately 16 cm. Periportal lymphadenopathy and bulky retroperitoneal and pelvic adenopathy with increased metabolic activity. Rim of soft tissue encases the aorta measuring approximately 2.2 cm greatest  thickness on image 166 of series 4 with a maximum SUV of 14.2. Similar metabolic activity and retrocrural, periportal and lower retroperitoneal lymph nodes. An example of pelvic adenopathy is a large node in the RIGHT groin measuring 2.2 cm with a maximum SUV of 15. Bilateral pelvic and inguinal adenopathy with similar metabolic activity though smaller than this dominant lymph node ranging between 1 and 2 cm in short axis. Ileal uptake which is segmental but intense at 7.4 and confined to the terminal ileum. Incidental CT findings: Hypoattenuation in the involved areas of the liver. Splenomegaly as described. Pancreas unremarkable by CT though with limited assessment due to surrounding adenopathy. Adrenal glands and kidneys without acute process. Nephrolithiasis with 2 3-4 mm calculi in the LEFT kidney. No hydronephrosis. Urinary bladder is under distended. No sign of bowel obstruction. Mesenteric edema and small volume ascites of uncertain significance in this 75 year old patient but potentially due to lymphatic congestion in the setting of extensive adenopathy. Ileal thickening and some borderline enlarged nodes in the ileocolic mesentery. SKELETON: Diffuse patchy areas of increased metabolic activity throughout the axial and appendicular skeleton involving all visualized bones with intense metabolic activity as an example in the RIGHT proximal humerus measuring 12.54 maximum SUV and diffuse metabolic activity in the pelvis, LEFT ischium at 11.2 for maximum  SUV. Similar uptake in multiple areas throughout the spine, ribs, scapulae, bilateral femora and bilateral humeri nearly confluent in many areas all levels of the thoracolumbar spine are affected less involvement in the cervical spine. Incidental CT findings: Areas of bony sclerosis within the thoracic spine. IMPRESSION: Widespread adenopathy, visceral involvement and signs of near diffuse, multifocal bony involvement, distribution most compatible with  lymphoma, Deauville category 5. Correlation with pathology results is suggested if not yet performed. Ileal involvement as well. Peritoneal involvement is suggested as well with nodular changes about the peritoneum but without significant FDG uptake. Example seen on image 179 of series 4. Paraspinal soft tissue without frank bony destruction. Scattered areas of sclerosis underestimate the degree of bony involvement. Abdominal ascites may be related to anasarca and or lymphatic congestion with marked enlargement of LEFT-sided pleural effusion and mild enlargement of small RIGHT-sided pleural fluid. These results will be called to the ordering clinician or representative by the Radiologist Assistant, and communication documented in the PACS or [employer]. Electronically Signed   By: Zetta Bills M.D.   On: 08/12/2020 16:30   DG Chest Portable 1 View  Result Date: 08/15/2020 CLINICAL DATA:  Shortness of breath, thoracentesis earlier today, worsening since discharge by report. EXAM: PORTABLE CHEST 1 VIEW COMPARISON:  August 15, 2020 10:47 a.m. FINDINGS: Since the previous, very recent imaging study there is been interval development of confluent alveolar airspace disease in the LEFT lower lobe. The LEFT hemidiaphragm remains obscured, LEFT heart border now obscured when compared to the prior study. Blunting of LEFT costodiaphragmatic sulcus compatible with residual effusion is similar to the prior study. Blunting of RIGHT costodiaphragmatic sulcus is unchanged, subtle RIGHT lower lobe airspace disease. Trachea is midline. Cardiomediastinal contours are stable grossly though limited assessment due to the presence of airspace disease in the LEFT chest. No sign of pneumothorax. On limited assessment no acute skeletal process. EKG leads project over the chest. IMPRESSION: 1. Interval development of confluent alveolar airspace disease in the LEFT lower lobe concerning for re-expansion pulmonary edema versus  pneumonia or aspiration. 2. No visible pneumothorax. 3. Small residual LEFT-sided effusion and small RIGHT-sided effusion. 4. Subtle opacity at the RIGHT lung base may represent atelectasis or developing pneumonia. Electronically Signed   By: Zetta Bills M.D.   On: 08/15/2020 14:27   DG Chest Port 1 View  Result Date: 08/15/2020 CLINICAL DATA:  Status post left thoracentesis EXAM: PORTABLE CHEST 1 VIEW COMPARISON:  08/01/2020 FINDINGS: Cardiomediastinal silhouette and pulmonary vasculature are within normal limits. No pneumothorax status post left thoracentesis. Significant interval reduction of left pleural effusion. Small bilateral pleural effusion still remain. IMPRESSION: No pneumothorax status post left thoracentesis. Electronically Signed   By: Miachel Roux M.D.   On: 08/15/2020 10:55   DG Chest Port 1 View  Result Date: 08/01/2020 CLINICAL DATA:  Low oxygen saturation after biopsy. Postop left axillary lymph node biopsy EXAM: PORTABLE CHEST 1 VIEW COMPARISON:  CT chest 07/21/2020 FINDINGS: Moderate large left pleural effusion has progressed. Progressive left lower lobe consolidation. No pneumothorax Small right effusion and mild right lower lobe atelectasis. Vascularity normal. IMPRESSION: Progressive left pleural effusion and left lower lobe consolidation. Small right effusion and mild right lower lobe atelectasis. Electronically Signed   By: Franchot Gallo M.D.   On: 08/01/2020 11:39   CT BONE MARROW BIOPSY & ASPIRATION  Result Date: 08/04/2020 INDICATION: Recent diagnosis of lymphoma. Please perform CT-guided bone marrow biopsy for staging purposes. EXAM: CT-GUIDED BONE MARROW BIOPSY AND ASPIRATION MEDICATIONS:  None ANESTHESIA/SEDATION: Fentanyl 100 mcg IV; Versed 2 mg IV Sedation Time: 11 Minutes; The patient was continuously monitored during the procedure by the interventional radiology nurse under my direct supervision. COMPLICATIONS: None immediate. PROCEDURE: Informed consent was  obtained from the patient following an explanation of the procedure, risks, benefits and alternatives. The patient understands, agrees and consents for the procedure. All questions were addressed. A time out was performed prior to the initiation of the procedure. The patient was positioned prone and non-contrast localization CT was performed of the pelvis to demonstrate the iliac marrow spaces. The operative site was prepped and draped in the usual sterile fashion. Under sterile conditions and local anesthesia, a 22 gauge spinal needle was utilized for procedural planning. Next, an 11 gauge coaxial bone biopsy needle was advanced into the left iliac marrow space. Needle position was confirmed with CT imaging. Initially, a bone marrow aspiration was performed. Next, a bone marrow biopsy was obtained with the 11 gauge outer bone marrow device. The 11 gauge coaxial bone biopsy needle was re-advanced into a slightly different location within the left iliac marrow space, positioning was confirmed with CT imaging and an additional bone marrow biopsy was obtained. The needle was removed and superficial hemostasis was obtained with manual compression. A dressing was applied. The patient tolerated the procedure well without immediate post procedural complication. IMPRESSION: Successful CT guided left iliac bone marrow aspiration and core biopsy. Electronically Signed   By: Sandi Mariscal M.D.   On: 08/04/2020 10:08   US BREAST LTD UNI LEFT INC AXILLA  Result Date: 07/24/2020 CLINICAL DATA:  Three-month follow-up of bilateral lymphadenopathy. Patient was biopsied in November with benign results. However there has been continued increase in axillary lymphadenopathy and splenomegaly. EXAM: ULTRASOUND OF THE BILATERAL BREAST COMPARISON:  Previous exam(s). FINDINGS: Targeted ultrasound was performed of the RIGHT axilla. There are multiple enlarged RIGHT axillary lymph nodes with diffusely thickened cortices and benign echogenic  hila. Representative cortical thickness measures up to 8 mm. This is increased in comparison to prior. Biopsy clip is noted within an axillary lymph node with a diffusely thickened cortex which was previously biopsied with benign results. Targeted ultrasound was performed of the LEFT axilla. There are multiple enlarged LEFT axillary lymph nodes with diffusely thickened cortices and benign echogenic hila. Representative cortex measures 8 mm in thickness. This is increased in comparison to prior. IMPRESSION: 1. There is continued increase in bilateral axillary lymphadenopathy. Given systemic increase in adenopathy, this remains concerning for an underlying lymphoproliferative process. Previous ultrasound-guided biopsy of a RIGHT axillary lymph node was nonrevealing. Patient is scheduled to meet with Dr. Mike Gip (oncologist) next week. Recommend management of axillary lymph nodes be determined by oncologist. The axillary lymph nodes are amenable for repeat ultrasound-guided biopsy if clinically desired. RECOMMENDATION: Recommend management of axillary lymphadenopathy be determined by oncologist. Axillary lymph nodes are amenable for ultrasound-guided biopsy. Recommend sampling of the LEFT axilla if ultrasound-guided biopsy is reattempted. I have discussed the findings and recommendations with the patient. If applicable, a reminder letter will be sent to the patient regarding the next appointment. BI-RADS CATEGORY  4: Suspicious. Electronically Signed   By: Valentino Saxon MD   On: 07/24/2020 10:37   US BREAST LTD UNI RIGHT INC AXILLA  Result Date: 07/24/2020 CLINICAL DATA:  Three-month follow-up of bilateral lymphadenopathy. Patient was biopsied in November with benign results. However there has been continued increase in axillary lymphadenopathy and splenomegaly. EXAM: ULTRASOUND OF THE BILATERAL BREAST COMPARISON:  Previous exam(s). FINDINGS:  Targeted ultrasound was performed of the RIGHT axilla. There are  multiple enlarged RIGHT axillary lymph nodes with diffusely thickened cortices and benign echogenic hila. Representative cortical thickness measures up to 8 mm. This is increased in comparison to prior. Biopsy clip is noted within an axillary lymph node with a diffusely thickened cortex which was previously biopsied with benign results. Targeted ultrasound was performed of the LEFT axilla. There are multiple enlarged LEFT axillary lymph nodes with diffusely thickened cortices and benign echogenic hila. Representative cortex measures 8 mm in thickness. This is increased in comparison to prior. IMPRESSION: 1. There is continued increase in bilateral axillary lymphadenopathy. Given systemic increase in adenopathy, this remains concerning for an underlying lymphoproliferative process. Previous ultrasound-guided biopsy of a RIGHT axillary lymph node was nonrevealing. Patient is scheduled to meet with Dr. Mike Gip (oncologist) next week. Recommend management of axillary lymph nodes be determined by oncologist. The axillary lymph nodes are amenable for repeat ultrasound-guided biopsy if clinically desired. RECOMMENDATION: Recommend management of axillary lymphadenopathy be determined by oncologist. Axillary lymph nodes are amenable for ultrasound-guided biopsy. Recommend sampling of the LEFT axilla if ultrasound-guided biopsy is reattempted. I have discussed the findings and recommendations with the patient. If applicable, a reminder letter will be sent to the patient regarding the next appointment. BI-RADS CATEGORY  4: Suspicious. Electronically Signed   By: Valentino Saxon MD   On: 07/24/2020 10:37   ECHOCARDIOGRAM COMPLETE  Result Date: 07/24/2020    ECHOCARDIOGRAM REPORT   Patient Name:   MERRIAM BRANDNER Date of Exam: 07/24/2020 Medical Rec #:  [id number]      Height:       64.0 in Accession #:    [id number]     Weight:       175.0 lb Date of Birth:  1946/01/15     BSA:          1.848 m Patient Age:    4 years        BP:           156/60 mmHg Patient Gender: F              HR:           116-echo ECG bpm. Exam Location:  [facility] Procedure: 2D Echo, Strain Analysis, Color Doppler and Cardiac Doppler Indications:     Tachycardia,                  Shortness of breath  History:         Patient has no prior history of Echocardiogram examinations.                  Risk Factors:Hypertension and Dyslipidemia.  Sonographer:     Sherrie Sport RDCS (AE) Referring Phys:  [id number] CHRISTOPHER END Diagnosing Phys: Ida Rogue MD  Sonographer Comments: Global longitudinal strain was attempted. IMPRESSIONS  1. Left ventricular ejection fraction, by estimation, is 60 to 65%. The left ventricle has normal function. The left ventricle has no regional wall motion abnormalities. Left ventricular diastolic parameters are consistent with Grade I diastolic dysfunction (impaired relaxation). The average left ventricular global longitudinal strain is -11.7 %. The global longitudinal strain is abnormal.  2. Right ventricular systolic function is normal. The right ventricular size is normal. There is mildly elevated pulmonary artery systolic pressure. The estimated right ventricular systolic pressure is 10.2 mmHg.  3. Left pleural effusion noted, 7 cm FINDINGS  Left Ventricle: Left ventricular ejection fraction, by estimation, is 60 to 65%.  The left ventricle has normal function. The left ventricle has no regional wall motion abnormalities. The average left ventricular global longitudinal strain is -11.7 %. The global longitudinal strain is abnormal. The left ventricular internal cavity size was normal in size. There is no left ventricular hypertrophy. Left ventricular diastolic parameters are consistent with Grade I diastolic dysfunction (impaired relaxation). Right Ventricle: The right ventricular size is normal. No increase in right ventricular wall thickness. Right ventricular systolic function is normal. There is mildly elevated pulmonary artery systolic  pressure. The tricuspid regurgitant velocity is 2.90  m/s, and with an assumed right atrial pressure of 5 mmHg, the estimated right ventricular systolic pressure is 85.9 mmHg. Left Atrium: Left atrial size was normal in size. Right Atrium: Right atrial size was normal in size. Pericardium: There is no evidence of pericardial effusion. Mitral Valve: The mitral valve is normal in structure. No evidence of mitral valve regurgitation. No evidence of mitral valve stenosis. Tricuspid Valve: The tricuspid valve is normal in structure. Tricuspid valve regurgitation is not demonstrated. No evidence of tricuspid stenosis. Aortic Valve: The aortic valve is normal in structure. Aortic valve regurgitation is not visualized. No aortic stenosis is present. Aortic valve mean gradient measures 4.0 mmHg. Aortic valve peak gradient measures 5.4 mmHg. Aortic valve area, by VTI measures 3.06 cm. Pulmonic Valve: The pulmonic valve was normal in structure. Pulmonic valve regurgitation is not visualized. No evidence of pulmonic stenosis. Aorta: The aortic root is normal in size and structure. Venous: The inferior vena cava is normal in size with greater than 50% respiratory variability, suggesting right atrial pressure of 3 mmHg. IAS/Shunts: No atrial level shunt detected by color flow Doppler.  LEFT VENTRICLE PLAX 2D LVIDd:         3.92 cm  Diastology LVIDs:         2.77 cm  LV e' medial:    6.64 cm/s LV PW:         1.11 cm  LV E/e' medial:  8.4 LV IVS:        0.81 cm  LV e' lateral:   9.03 cm/s LVOT diam:     2.20 cm  LV E/e' lateral: 6.2 LV SV:         68 LV SV Index:   37       2D Longitudinal Strain LVOT Area:     3.80 cm 2D Strain GLS Avg:     -11.7 %                          3D Volume EF:                         3D EF:        56 %                         LV EDV:       68 ml                         LV ESV:       30 ml                         LV SV:        38 ml RIGHT VENTRICLE RV Basal diam:  3.02 cm RV S prime:  17.00 cm/s  TAPSE (M-mode): 3.8 cm LEFT ATRIUM           Index       RIGHT ATRIUM          Index LA diam:      2.60 cm 1.41 cm/m  RA Area:     9.40 cm LA Vol (A2C): 19.8 ml 10.71 ml/m RA Volume:   16.30 ml 8.82 ml/m LA Vol (A4C): 24.9 ml 13.47 ml/m  AORTIC VALVE                   PULMONIC VALVE AV Area (Vmax):    3.34 cm    PV Vmax:        0.75 m/s AV Area (Vmean):   2.94 cm    PV Peak grad:   2.2 mmHg AV Area (VTI):     3.06 cm    RVOT Peak grad: 3 mmHg AV Vmax:           116.00 cm/s AV Vmean:          91.100 cm/s AV VTI:            0.221 m AV Peak Grad:      5.4 mmHg AV Mean Grad:      4.0 mmHg LVOT Vmax:         102.00 cm/s LVOT Vmean:        70.500 cm/s LVOT VTI:          0.178 m LVOT/AV VTI ratio: 0.81  AORTA Ao Root diam: 3.20 cm MITRAL VALVE               TRICUSPID VALVE MV Area (PHT): 5.62 cm    TR Peak grad:   33.6 mmHg MV Decel Time: 135 msec    TR Vmax:        290.00 cm/s MV E velocity: 56.10 cm/s MV A velocity: 87.40 cm/s  SHUNTS MV E/A ratio:  0.64        Systemic VTI:  0.18 m                            Systemic Diam: 2.20 cm Ida Rogue MD Electronically signed by Ida Rogue MD Signature Date/Time: 07/24/2020/8:41:02 PM    Final    US THORACENTESIS ASP PLEURAL SPACE W/IMG GUIDE  Result Date: 08/15/2020 INDICATION: Lymphoma. Shortness of breath. Large left pleural effusion. Request diagnostic therapeutic thoracentesis. EXAM: ULTRASOUND GUIDED LEFT THORACENTESIS MEDICATIONS: 1% plain lidocaine, 8 mL COMPLICATIONS: None immediate. PROCEDURE: An ultrasound guided thoracentesis was thoroughly discussed with the patient and questions answered. The benefits, risks, alternatives and complications were also discussed. The patient understands and wishes to proceed with the procedure. Written consent was obtained. Ultrasound was performed to localize and mark an adequate pocket of fluid in the left chest. The area was then prepped and draped in the normal sterile fashion. 1% Lidocaine was used for local  anesthesia. Under ultrasound guidance a 6 Fr Safe-T-Centesis catheter was introduced. Thoracentesis was performed. The catheter was removed and a dressing applied. FINDINGS: A total of approximately 1.6 L of bloody pleural fluid was removed. Samples were sent to the laboratory as requested by the clinical team. IMPRESSION: Successful ultrasound guided left thoracentesis yielding 1.6 L of pleural fluid. Read by: Ascencion Dike PA-C Electronically Signed   By: Miachel Roux M.D.   On: 08/15/2020 11:23      Labs: BNP (last 3 results) Recent Labs  08/15/20 1342  BNP 28.0   Basic Metabolic Panel: Recent Labs  Lab 08/16/20 0739 08/17/20 0812 08/18/20 0818 08/19/20 0530 08/20/20 0429  NA 124* 126* 130* 134* 135  K 4.3 3.8 4.2 3.8 3.8  CL 89* 92* 94* 98 98  CO2 23 24 25 27 27   GLUCOSE 106* 119* 115* 117* 124*  BUN 19 16 16 18 18   CREATININE 0.81 0.71 0.80 0.82 0.87  CALCIUM 8.5* 8.5* 9.2 9.3 9.2   Liver Function Tests: Recent Labs  Lab 08/15/20 1448  AST 31  ALT 14  ALKPHOS 92  BILITOT 1.7*  PROT 7.7  ALBUMIN 3.7   No results for input(s): LIPASE, AMYLASE in the last 168 hours. No results for input(s): AMMONIA in the last 168 hours. CBC: Recent Labs  Lab 08/14/20 1519 08/15/20 1342 08/16/20 0739 08/17/20 0812 08/18/20 0818 08/19/20 0530 08/20/20 0429  WBC 14.9* 16.9* 17.0* 13.4* 12.8* 11.8* 10.2  NEUTROABS 10.6* 11.7*  --   --   --   --   --   HGB 10.5* 13.1 11.4* 10.0* 10.1* 9.1* 8.9*  HCT 30.3* 39.0 33.0* 28.4* 30.0* 27.4* 26.7*  MCV 84.6 88.6 87.5 85.8 88.5 90.4 92.1  PLT 41* 44* 45* 51* 62* 64* 65*   Cardiac Enzymes: No results for input(s): CKTOTAL, CKMB, CKMBINDEX, TROPONINI in the last 168 hours. BNP: Invalid input(s): POCBNP CBG: No results for input(s): GLUCAP in the last 168 hours. D-Dimer No results for input(s): DDIMER in the last 72 hours. Hgb A1c No results for input(s): HGBA1C in the last 72 hours. Lipid Profile No results for input(s):  CHOL, HDL, LDLCALC, TRIG, CHOLHDL, LDLDIRECT in the last 72 hours. Thyroid function studies No results for input(s): TSH, T4TOTAL, T3FREE, THYROIDAB in the last 72 hours.  Invalid input(s): FREET3 Anemia work up Recent Labs    08/20/20 0429  VITAMINB12 206  FOLATE 5.1*  FERRITIN 271  TIBC 330  IRON 81  RETICCTPCT 5.9*   Urinalysis    Component Value Date/Time   COLORURINE YELLOW (A) 07/21/2020 1124   APPEARANCEUR HAZY (A) 07/21/2020 1124   LABSPEC 1.017 07/21/2020 1124   PHURINE 5.0 07/21/2020 1124   GLUCOSEU NEGATIVE 07/21/2020 1124   HGBUR NEGATIVE 07/21/2020 1124   BILIRUBINUR NEGATIVE 07/21/2020 1124   [place] 07/21/2020 1124   PROTEINUR 100 (A) 07/21/2020 1124   UROBILINOGEN 0.2 09/21/2010 1140   NITRITE NEGATIVE 07/21/2020 1124   LEUKOCYTESUR NEGATIVE 07/21/2020 1124   Sepsis Labs Invalid input(s): PROCALCITONIN,  WBC,  LACTICIDVEN Microbiology Recent Results (from the past 240 hour(s))  SARS CORONAVIRUS 2 (TAT 6-24 HRS) Nasopharyngeal Nasopharyngeal Swab     Status: None   Collection Time: 08/13/20 11:15 AM   Specimen: Nasopharyngeal Swab  Result Value Ref Range Status   SARS Coronavirus 2 NEGATIVE NEGATIVE Final    Comment: (NOTE) SARS-CoV-2 target nucleic acids are NOT DETECTED.  The SARS-CoV-2 RNA is generally detectable in upper and lower respiratory specimens during the acute phase of infection. Negative results do not preclude SARS-CoV-2 infection, do not rule out co-infections with other pathogens, and should not be used as the sole basis for treatment or other patient management decisions. Negative results must be combined with clinical observations, patient history, and epidemiological information. The expected result is Negative.  Fact Sheet for Patients: SugarRoll.be  Fact Sheet for Healthcare Providers: https://www.woods-mathews.com/  This test is not yet approved or cleared by the [place] FDA and  has been authorized for detection and/or diagnosis of SARS-CoV-2  by FDA under an Emergency Use Authorization (EUA). This EUA will remain  in effect (meaning this test can be used) for the duration of the COVID-19 declaration under Se ction 564(b)(1) of the Act, 21 U.S.C. section 360bbb-3(b)(1), unless the authorization is terminated or revoked sooner.  Performed at [facility], [place] [address] [place], [place] [address]   Body fluid culture w Gram Stain     Status: None   Collection Time: 08/15/20 10:30 AM   Specimen: Pleura; Body Fluid  Result Value Ref Range Status   Specimen Description   Final    PLEURAL Performed at [facility], [address] [place], [place] [address]    Special Requests   Final    PLEURAL Performed at [facility], [address] [place], [place] [address]    Gram Stain   Final    FEW WBC PRESENT, PREDOMINANTLY MONONUCLEAR NO ORGANISMS SEEN    Culture   Final    NO GROWTH 3 DAYS Performed at [facility], [place] [address] [place], [place] [address]    Report Status 08/20/2020 FINAL  Final  Resp Panel by RT-PCR (Flu A&B, Covid) Nasopharyngeal Swab     Status: None   Collection Time: 08/15/20  3:42 PM   Specimen: Nasopharyngeal Swab; Nasopharyngeal(NP) swabs in vial transport medium  Result Value Ref Range Status   SARS Coronavirus 2 by RT PCR NEGATIVE NEGATIVE Final    Comment: (NOTE) SARS-CoV-2 target nucleic acids are NOT DETECTED.  The SARS-CoV-2 RNA is generally detectable in upper respiratory specimens during the acute phase of infection. The lowest concentration of SARS-CoV-2 viral copies this assay can detect is 138 copies/mL. A negative result does not preclude SARS-Cov-2 infection and should not be used as the sole basis for treatment or other patient management decisions. A negative result may occur with  improper specimen collection/handling, submission of specimen other than  nasopharyngeal swab, presence of viral mutation(s) within the areas targeted by this assay, and inadequate number of viral copies(<138 copies/mL). A negative result must be combined with clinical observations, patient history, and epidemiological information. The expected result is Negative.  Fact Sheet for Patients:  EntrepreneurPulse.com.au  Fact Sheet for Healthcare Providers:  IncredibleEmployment.be  This test is no t yet approved or cleared by the [place] FDA and  has been authorized for detection and/or diagnosis of SARS-CoV-2 by FDA under an Emergency Use Authorization (EUA). This EUA will remain  in effect (meaning this test can be used) for the duration of the COVID-19 declaration under Section 564(b)(1) of the Act, 21 U.S.C.section 360bbb-3(b)(1), unless the authorization is terminated  or revoked sooner.       Influenza A by PCR NEGATIVE NEGATIVE Final   Influenza B by PCR NEGATIVE NEGATIVE Final    Comment: (NOTE) The Xpert Xpress SARS-CoV-2/FLU/RSV plus assay is intended as an aid in the diagnosis of influenza from Nasopharyngeal swab specimens and should not be used as a sole basis for treatment. Nasal washings and aspirates are unacceptable for Xpert Xpress SARS-CoV-2/FLU/RSV testing.  Fact Sheet for Patients: EntrepreneurPulse.com.au  Fact Sheet for Healthcare Providers: IncredibleEmployment.be  This test is not yet approved or cleared by the [place] FDA and has been authorized for detection and/or diagnosis of SARS-CoV-2 by FDA under an Emergency Use Authorization (EUA). This EUA will remain in effect (meaning this test can be used) for the duration of the COVID-19 declaration under Section 564(b)(1) of the Act, 21 U.S.C. section 360bbb-3(b)(1),  unless the authorization is terminated or revoked.  Performed at [facility], [address] [place], [place]  [address]   Culture, blood (Routine X 2) w Reflex to ID Panel     Status: None (Preliminary result)   Collection Time: 08/15/20  4:55 PM   Specimen: BLOOD  Result Value Ref Range Status   Specimen Description BLOOD BLOOD LEFT ARM  Final   Special Requests   Final    BOTTLES DRAWN AEROBIC AND ANAEROBIC Blood Culture adequate volume   Culture  Setup Time   Final    GRAM POSITIVE RODS CRITICAL RESULT CALLED TO, READ BACK BY AND VERIFIED WITHVioleta Gelinas [facility] 1935 08/20/20 HNM Performed at [facility], [address] [place], [place] [address]    Culture GRAM POSITIVE RODS  Final   Report Status PENDING  Incomplete  Culture, blood (Routine X 2) w Reflex to ID Panel     Status: None   Collection Time: 08/15/20  5:16 PM   Specimen: BLOOD  Result Value Ref Range Status   Specimen Description BLOOD BLOOD LEFT HAND  Final   Special Requests   Final    BOTTLES DRAWN AEROBIC AND ANAEROBIC Blood Culture adequate volume   Culture   Final    NO GROWTH 5 DAYS Performed at [facility], [address] [place], [place] [address]    Report Status 08/20/2020 FINAL  Final  MRSA PCR Screening     Status: None   Collection Time: 08/18/20 12:35 PM   Specimen: Nasal Mucosa; Nasopharyngeal  Result Value Ref Range Status   MRSA by PCR NEGATIVE NEGATIVE Final    Comment:        The GeneXpert MRSA Assay (FDA approved for NASAL specimens only), is one component of a comprehensive MRSA colonization surveillance program. It is not intended to diagnose MRSA infection nor to guide or monitor treatment for MRSA infections. Performed at [facility], [address] [place],  [address]      Total time spend on discharging this patient, including the last patient exam, discussing the hospital stay, instructions for ongoing care as it relates to all pertinent caregivers, as well as preparing the medical discharge records, prescriptions, and/or referrals as  applicable, is 45 minutes.    Enzo Bi, MD  [facility] 08/20/2020, 12:38 PM

## 2020-08-20 NOTE — Plan of Care (Signed)
  Problem: Education: Goal: Knowledge of General Education information will improve Description: Including pain rating scale, medication(s)/side effects and non-pharmacologic comfort measures 08/20/2020 1253 by Cristela Blue, RN Outcome: Progressing 08/20/2020 1253 by Cristela Blue, RN Outcome: Progressing   Problem: Health Behavior/Discharge Planning: Goal: Ability to manage health-related needs will improve 08/20/2020 1253 by Cristela Blue, RN Outcome: Progressing 08/20/2020 1253 by Cristela Blue, RN Outcome: Progressing   Problem: Clinical Measurements: Goal: Ability to maintain clinical measurements within normal limits will improve 08/20/2020 1253 by Cristela Blue, RN Outcome: Progressing 08/20/2020 1253 by Cristela Blue, RN Outcome: Progressing Goal: Will remain free from infection 08/20/2020 1253 by Cristela Blue, RN Outcome: Progressing 08/20/2020 1253 by Cristela Blue, RN Outcome: Progressing Goal: Diagnostic test results will improve 08/20/2020 1253 by Cristela Blue, RN Outcome: Progressing 08/20/2020 1253 by Cristela Blue, RN Outcome: Progressing Goal: Respiratory complications will improve 08/20/2020 1253 by Cristela Blue, RN Outcome: Progressing 08/20/2020 1253 by Cristela Blue, RN Outcome: Progressing Goal: Cardiovascular complication will be avoided 08/20/2020 1253 by Cristela Blue, RN Outcome: Progressing 08/20/2020 1253 by Cristela Blue, RN Outcome: Progressing   Problem: Activity: Goal: Risk for activity intolerance will decrease 08/20/2020 1253 by Cristela Blue, RN Outcome: Progressing 08/20/2020 1253 by Cristela Blue, RN Outcome: Progressing   Problem: Nutrition: Goal: Adequate nutrition will be maintained 08/20/2020 1253 by Cristela Blue, RN Outcome: Progressing 08/20/2020 1253 by Cristela Blue, RN Outcome: Progressing   Problem: Coping: Goal: Level of anxiety will decrease 08/20/2020 1253 by Cristela Blue, RN Outcome:  Progressing 08/20/2020 1253 by Cristela Blue, RN Outcome: Progressing   Problem: Elimination: Goal: Will not experience complications related to bowel motility 08/20/2020 1253 by Cristela Blue, RN Outcome: Progressing 08/20/2020 1253 by Cristela Blue, RN Outcome: Progressing Goal: Will not experience complications related to urinary retention 08/20/2020 1253 by Cristela Blue, RN Outcome: Progressing 08/20/2020 1253 by Cristela Blue, RN Outcome: Progressing   Problem: Pain Managment: Goal: General experience of comfort will improve 08/20/2020 1253 by Cristela Blue, RN Outcome: Progressing 08/20/2020 1253 by Cristela Blue, RN Outcome: Progressing   Problem: Safety: Goal: Ability to remain free from injury will improve 08/20/2020 1253 by Cristela Blue, RN Outcome: Progressing 08/20/2020 1253 by Cristela Blue, RN Outcome: Progressing   Problem: Skin Integrity: Goal: Risk for impaired skin integrity will decrease 08/20/2020 1253 by Cristela Blue, RN Outcome: Progressing 08/20/2020 1253 by Cristela Blue, RN Outcome: Progressing

## 2020-08-20 NOTE — Progress Notes (Signed)
PHARMACY - PHYSICIAN COMMUNICATION CRITICAL VALUE ALERT - BLOOD CULTURE IDENTIFICATION (BCID)  Natasha Farrell is an 75 y.o. female who presented to [facility] on 08/15/2020 with a chief complaint of  resp failure.   Assessment:  Gram positive rods in 1 of 4 bottles (anaerobic)  (include suspected source if known)  Name of physician (or Provider) Contacted: Rachael Fee, NP   Current antibiotics: Unasyn 1.5 gm IV Q6H   Changes to prescribed antibiotics recommended:  Patient is on recommended antibiotics - No changes needed  No results found for this or any previous visit.  Keyontae Huckeby D 08/20/2020  4:09 AM

## 2020-08-20 NOTE — Consult Note (Addendum)
Reason for Consult: Central venous access for anticipated chemotherapy   Referring Physician: Nolon Stalls, MD  Natasha Farrell is an 75 y.o. female.  HPI: Patient know to me from left axillary biopsy August 01, 2020 to obtain tissue for diagnosis.  Path still pending.  Was having SOB secondary to left pleural effusion. Tapped on March 25 with 1.7 L returned. Patient unfortunately developed flash pulmonary edema in the previously collapse LLL requiring admission the same day.  Feeling much better at present. Off supplemental O2, but still modestly tachypneic.  Seen today to re-address Power port placement.   Past Medical History:  Diagnosis Date  . Hx of hysterectomy   . Hyperlipidemia   . Hypertension   . Thyroid disease    rt thryroidectomy  09/25/2010    Past Surgical History:  Procedure Laterality Date  . BREAST BIOPSY Right 04/21/2020   hydromark 3 [place] bx path pending  . LYMPH NODE BIOPSY Left 08/01/2020   Procedure: LYMPH NODE BIOPSY;  Surgeon: Robert Bellow, MD;  Location: [facility] ORS;  Service: General;  Laterality: Left;    Family History  Problem Relation Age of Onset  . Breast cancer Sister 69  . Colon cancer Sister   . Kidney cancer Sister     Social History:  reports that she has never smoked. She has never used smokeless tobacco. She reports that she does not drink alcohol and does not use drugs.  Allergies:  Allergies  Allergen Reactions  . Lisinopril Swelling    angioedema    Medications: I have reviewed the patient's current medications.  Results for orders placed or performed during the hospital encounter of 08/15/20 (from the past 48 hour(s))  MRSA PCR Screening     Status: None   Collection Time: 08/18/20 12:35 PM   Specimen: Nasal Mucosa; Nasopharyngeal  Result Value Ref Range   MRSA by PCR NEGATIVE NEGATIVE    Comment:        The GeneXpert MRSA Assay (FDA approved for NASAL specimens only), is one component of a comprehensive MRSA  colonization surveillance program. It is not intended to diagnose MRSA infection nor to guide or monitor treatment for MRSA infections. Performed at [facility], [address] [place], [place] [address]   Occult blood card to lab, stool     Status: None   Collection Time: 08/19/20  5:20 AM  Result Value Ref Range   Fecal Occult Bld NEGATIVE NEGATIVE    Comment: Performed at [facility], [address] [place], [place] [address]  CBC     Status: Abnormal   Collection Time: 08/19/20  5:30 AM  Result Value Ref Range   WBC 11.8 (H) 4.0 - 10.5 K/uL   RBC 3.03 (L) 3.87 - 5.11 MIL/uL   Hemoglobin 9.1 (L) 12.0 - 15.0 g/dL   HCT 27.4 (L) 36.0 - 46.0 %   MCV 90.4 80.0 - 100.0 fL   MCH 30.0 26.0 - 34.0 pg   MCHC 33.2 30.0 - 36.0 g/dL   RDW 18.1 (H) 11.5 - 15.5 %   Platelets 64 (L) 150 - 400 K/uL    Comment: Immature Platelet Fraction may be clinically indicated, consider ordering this additional test [id number]    nRBC 0.0 0.0 - 0.2 %    Comment: Performed at [facility], [address] [place], [place] [address]  Basic metabolic panel     Status: Abnormal   Collection Time: 08/19/20  5:30 AM  Result Value Ref  Range   Sodium 134 (L) 135 - 145 mmol/L   Potassium 3.8 3.5 - 5.1 mmol/L   Chloride 98 98 - 111 mmol/L   CO2 27 22 - 32 mmol/L   Glucose, Bld 117 (H) 70 - 99 mg/dL    Comment: Glucose reference range applies only to samples taken after fasting for at least 8 hours.   BUN 18 8 - 23 mg/dL   Creatinine, Ser 0.82 0.44 - 1.00 mg/dL   Calcium 9.3 8.9 - 10.3 mg/dL   GFR, Estimated >60 >60 mL/min    Comment: (NOTE) Calculated using the CKD-EPI Creatinine Equation (2021)    Anion gap 9 5 - 15    Comment: Performed at [facility], [address] [place], [place] [address]  CBC     Status: Abnormal   Collection Time: 08/20/20  4:29 AM  Result Value Ref Range   WBC 10.2 4.0 - 10.5 K/uL   RBC 2.90 (L) 3.87 - 5.11 MIL/uL   Hemoglobin  8.9 (L) 12.0 - 15.0 g/dL   HCT 26.7 (L) 36.0 - 46.0 %   MCV 92.1 80.0 - 100.0 fL   MCH 30.7 26.0 - 34.0 pg   MCHC 33.3 30.0 - 36.0 g/dL   RDW 18.7 (H) 11.5 - 15.5 %   Platelets 65 (L) 150 - 400 K/uL    Comment: Immature Platelet Fraction may be clinically indicated, consider ordering this additional test [id number]    nRBC 0.2 0.0 - 0.2 %    Comment: Performed at [facility], [address] [place], [place] [address]  Basic metabolic panel     Status: Abnormal   Collection Time: 08/20/20  4:29 AM  Result Value Ref Range   Sodium 135 135 - 145 mmol/L   Potassium 3.8 3.5 - 5.1 mmol/L   Chloride 98 98 - 111 mmol/L   CO2 27 22 - 32 mmol/L   Glucose, Bld 124 (H) 70 - 99 mg/dL    Comment: Glucose reference range applies only to samples taken after fasting for at least 8 hours.   BUN 18 8 - 23 mg/dL   Creatinine, Ser 0.87 0.44 - 1.00 mg/dL   Calcium 9.2 8.9 - 10.3 mg/dL   GFR, Estimated >60 >60 mL/min    Comment: (NOTE) Calculated using the CKD-EPI Creatinine Equation (2021)    Anion gap 10 5 - 15    Comment: Performed at [facility], [address] [place], [place] [address]  Folate     Status: Abnormal   Collection Time: 08/20/20  4:29 AM  Result Value Ref Range   Folate 5.1 (L) >5.9 ng/mL    Comment: Performed at [facility], [address] [place], [place] [address]  Iron and TIBC     Status: None   Collection Time: 08/20/20  4:29 AM  Result Value Ref Range   Iron 81 28 - 170 ug/dL   TIBC 330 250 - 450 ug/dL   Saturation Ratios 25 10.4 - 31.8 %   UIBC 249 ug/dL    Comment: Performed at [facility], [address] [place], [place] [address]  Ferritin     Status: None   Collection Time: 08/20/20  4:29 AM  Result Value Ref Range   Ferritin 271 11 - 307 ng/mL    Comment: Performed at [facility], [address] [place], [place] [address]  Reticulocytes     Status: Abnormal   Collection Time: 08/20/20  4:29 AM  Result Value Ref Range   Retic Ct Pct 5.9 (H) 0.4 - 3.1 %   RBC. 2.99 (L) 3.87 - 5.11 MIL/uL   Retic Count, Absolute 175.8 19.0 - 186.0 K/uL   Immature Retic Fract 38.6 (H) 2.3 - 15.9 %    Comment: Performed at [facility], [address] [place], [place] [address]    No results found.  Review of Systems Blood pressure 136/60, pulse (!) 103, temperature 97.7 F (36.5 C), temperature source Oral, resp. rate 20, height 5\' 4"  (1.626 m), weight 74.3 kg, SpO2 100 %. Physical Exam Constitutional:      Appearance: She is normal weight.  HENT:     Head: Normocephalic.  Cardiovascular:     Rate and Rhythm: Normal rate and regular rhythm.     Heart sounds: Normal heart sounds.  Pulmonary:     Breath sounds: No wheezing or rales.     Comments: Mild dyspnea during speech (7 word).  Mild decrease LLL breath sounds.  Chest:  Breasts:     Left: Axillary adenopathy (axillary incision well healed. No seroma formation. ) present.    Musculoskeletal:     Cervical back: Normal range of motion.  Lymphadenopathy:     Upper Body:     Left upper body: Axillary adenopathy (axillary incision well healed. No seroma formation. ) present.  Neurological:     Mental Status: She is alert.  Psychiatric:        Attention and Perception: Attention normal.        Mood and Affect: Mood normal.        Speech: Speech normal.        Behavior: Behavior normal.     Laboratory/ radiologic assessment:  Serial CXR exams from 08/01/20 thru this AM reviewed.  Resolution of most LLL findings over the last two studies.  OX: 100% RA at rest.  Ref Range & Units 04:29  Sodium 135 - 145 mmol/L 135   Potassium 3.5 - 5.1 mmol/L 3.8   Chloride 98 - 111 mmol/L 98   CO2 22 - 32 mmol/L 27   Glucose, Bld 70 - 99 mg/dL 124High   Comment: Glucose reference range applies only to samples taken after fasting for at least 8 hours.  BUN 8 - 23 mg/dL 18   Creatinine, Ser 0.44 - 1.00 mg/dL 0.87   Calcium 8.9 -  10.3 mg/dL 9.2   GFR, Estimated >60 mL/min >60   Comment: (NOTE)  Calculated using the CKD-EPI Creatinine Equation (2021)   Anion gap 5 - 15 10    WBC 4.0 - 10.5 K/uL 10.2   RBC 3.87 - 5.11 MIL/uL 2.90Low   Hemoglobin 12.0 - 15.0 g/dL 8.9Low   HCT 36.0 - 46.0 % 26.7Low   MCV 80.0 - 100.0 fL 92.1   MCH 26.0 - 34.0 pg 30.7   MCHC 30.0 - 36.0 g/dL 33.3   RDW 11.5 - 15.5 % 18.7High   Platelets 150 - 400 K/uL 65Low    Resolution of hyponatremia present on admission.  Stable thrombocytopenia secondary to splenomegaly.    Plan: Reviewed Power Port placement.  Risks reviewed (previously discussed at on August 12, 2020 office visit.   Tentatively scheduled for Friday AM, 8:30. NPO after MN except meds.    The patient will not require additional preoperative antibiotics if AM dose April 1,2022 can be administered preoperatively.   [place] Gleason Mishelle Hassan 08/20/2020, 11:08 AM

## 2020-08-20 NOTE — Progress Notes (Signed)
This RN provided discharge instructions and teaching to the patient. The patient verbalized and demonstrated understanding of the provided instructions. All outstanding questions resolved. R hand PIV removed. Cannula intact. Pt tolerated well. All belongings packed and in tow. Volunteer to transport patient via wheelchair to private vehicle for discharge.

## 2020-08-21 ENCOUNTER — Other Ambulatory Visit: Payer: Self-pay | Admitting: Hematology and Oncology

## 2020-08-21 ENCOUNTER — Telehealth: Payer: Self-pay | Admitting: *Deleted

## 2020-08-21 ENCOUNTER — Other Ambulatory Visit
Admission: RE | Admit: 2020-08-21 | Discharge: 2020-08-21 | Disposition: A | Discharge status: Home / Self Care | Payer: Medicare HMO | Source: Ambulatory Visit | Attending: General Surgery | Admitting: General Surgery

## 2020-08-21 ENCOUNTER — Other Ambulatory Visit: Payer: Self-pay | Admitting: General Surgery

## 2020-08-21 ENCOUNTER — Telehealth: Payer: Self-pay | Admitting: Internal Medicine

## 2020-08-21 ENCOUNTER — Other Ambulatory Visit: Payer: Self-pay

## 2020-08-21 DIAGNOSIS — J90 Pleural effusion, not elsewhere classified: Secondary | ICD-10-CM | POA: Diagnosis not present

## 2020-08-21 DIAGNOSIS — Z01812 Encounter for preprocedural laboratory examination: Secondary | ICD-10-CM | POA: Insufficient documentation

## 2020-08-21 DIAGNOSIS — Z20822 Contact with and (suspected) exposure to covid-19: Secondary | ICD-10-CM | POA: Insufficient documentation

## 2020-08-21 DIAGNOSIS — C8219 Follicular lymphoma grade II, extranodal and solid organ sites: Secondary | ICD-10-CM | POA: Diagnosis not present

## 2020-08-21 LAB — SURGICAL PATHOLOGY

## 2020-08-21 LAB — SARS CORONAVIRUS 2 (TAT 6-24 HRS): SARS Coronavirus 2: NEGATIVE

## 2020-08-21 MED ORDER — CHLORHEXIDINE GLUCONATE CLOTH 2 % EX PADS: 6.0000 | MEDICATED_PAD | Freq: Once | CUTANEOUS | Status: DC

## 2020-08-21 MED ORDER — OMEPRAZOLE 20 MG PO CPDR: 20.0000 mg | DELAYED_RELEASE_CAPSULE | Freq: Every day | ORAL | 3 refills | Status: DC

## 2020-08-21 MED ORDER — ONDANSETRON HCL 8 MG PO TABS: 8.0000 mg | ORAL_TABLET | Freq: Three times a day (TID) | ORAL | 3 refills | Status: DC | PRN

## 2020-08-21 MED ORDER — CEFAZOLIN SODIUM-DEXTROSE 2-4 GM/100ML-% IV SOLN
2.0000 g | INTRAVENOUS | Status: AC
  Administered 2020-08-22: 2 g via INTRAVENOUS

## 2020-08-21 NOTE — Telephone Encounter (Signed)
I spoke with Dr. Mike Gip.  No contraindications for R-CHOP from a cardiac standpoint given that echocardiogram showed normal LV systolic function.

## 2020-08-21 NOTE — Telephone Encounter (Signed)
"  Patient called stating that Dr Mike Gip was to have sent in medicine for her stomach last Thursday and nothing has been received by pharmacy yet"

## 2020-08-21 NOTE — Telephone Encounter (Signed)
Cancer center Nurse will be at this number until 4:30, tomorrow will be at [phone].  Please call to discuss medication Rchop. Would like to know if patient can tolerate this with heart condition.

## 2020-08-21 NOTE — Telephone Encounter (Signed)
When I called the patient back, she wanted a prescription for zofran and omeprazole.   I spoke with Dr. Mike Gip and she gave a verbal order to send these medications to the patient's pharmacy.  Patient understands that her final path is still not resulted. At this time, we are waiting on the final path from [facility]. Dr. Loletha Grayer will call her once this is available and set up future apts for chemotherapy. Her port will be placed tomorrow by Dr. Bary Castilla. Dr. Loletha Grayer is tentatively planning RCHOP  Patient is aware that I did reach to Dr. Jeralyn Ruths office. I have left a message for the cardiologist's nurse to call Nira Conn, RN to discuss the patient's risk factors for high risk cardiotoxicity drug/anthrocyclines drug class (per v/o Dr. Mike Gip). I spoke with Gabriel Cirri who will give the message to Dr. Darnelle Bos nursing team.  Pt gave verbal understanding of the plan of care and voiced appreciation for keeping her updated in her plan of care. I apologized for the delays in her care given the path report not yet finalized. She stated that she knew that Dr Mike Gip was doing what was best for her and she completely understood the situation.

## 2020-08-21 NOTE — Progress Notes (Signed)
Expand All Collapse All[] Expand All by Default     Show:Clear all [] Manual[] Template[] Copied  Added by: [x] Cinch Ormond, [place] Gleason, MD   [] Hover for details  Reason for Consult: Central venous access for anticipated chemotherapy               Referring Physician: Nolon Stalls, MD  Natasha Farrell is an 75 y.o. female.  HPI: Patient know to me from left axillary biopsy August 01, 2020 to obtain tissue for diagnosis.  Path still pending.  Was having SOB secondary to left pleural effusion. Tapped on March 25 with 1.7 L returned. Patient unfortunately developed flash pulmonary edema in the previously collapse LLL requiring admission the same day.  Feeling much better at present. Off supplemental O2, but still modestly tachypneic.  Seen today to re-address Power port placement.       Past Medical History:  Diagnosis Date  . Hx of hysterectomy   . Hyperlipidemia   . Hypertension   . Thyroid disease    rt thryroidectomy  09/25/2010         Past Surgical History:  Procedure Laterality Date  . BREAST BIOPSY Right 04/21/2020   hydromark 3 [place] bx path pending  . LYMPH NODE BIOPSY Left 08/01/2020   Procedure: LYMPH NODE BIOPSY;  Surgeon: Robert Bellow, MD;  Location: [facility] ORS;  Service: General;  Laterality: Left;         Family History  Problem Relation Age of Onset  . Breast cancer Sister 63  . Colon cancer Sister   . Kidney cancer Sister     Social History:  reports that she has never smoked. She has never used smokeless tobacco. She reports that she does not drink alcohol and does not use drugs.  Allergies:       Allergies  Allergen Reactions  . Lisinopril Swelling    angioedema    Medications: I have reviewed the patient's current medications.  Lab Results Last 48 Hours        Results for orders placed or performed during the hospital encounter of 08/15/20 (from the past 48 hour(s))  MRSA PCR Screening     Status: None   Collection Time:  08/18/20 12:35 PM   Specimen: Nasal Mucosa; Nasopharyngeal  Result Value Ref Range   MRSA by PCR NEGATIVE NEGATIVE    Comment:        The GeneXpert MRSA Assay (FDA approved for NASAL specimens only), is one component of a comprehensive MRSA colonization surveillance program. It is not intended to diagnose MRSA infection nor to guide or monitor treatment for MRSA infections. Performed at [facility], [address] [place], [place] [address]   Occult blood card to lab, stool     Status: None   Collection Time: 08/19/20  5:20 AM  Result Value Ref Range   Fecal Occult Bld NEGATIVE NEGATIVE    Comment: Performed at [facility], [address] [place], [place] [address]  CBC     Status: Abnormal   Collection Time: 08/19/20  5:30 AM  Result Value Ref Range   WBC 11.8 (H) 4.0 - 10.5 K/uL   RBC 3.03 (L) 3.87 - 5.11 MIL/uL   Hemoglobin 9.1 (L) 12.0 - 15.0 g/dL   HCT 27.4 (L) 36.0 - 46.0 %   MCV 90.4 80.0 - 100.0 fL   MCH 30.0 26.0 - 34.0 pg   MCHC 33.2 30.0 - 36.0 g/dL   RDW 18.1 (H) 11.5 - 15.5 %   Platelets  64 (L) 150 - 400 K/uL    Comment: Immature Platelet Fraction may be clinically indicated, consider ordering this additional test [id number]    nRBC 0.0 0.0 - 0.2 %    Comment: Performed at [facility], [address] [place], [place] [address]  Basic metabolic panel     Status: Abnormal   Collection Time: 08/19/20  5:30 AM  Result Value Ref Range   Sodium 134 (L) 135 - 145 mmol/L   Potassium 3.8 3.5 - 5.1 mmol/L   Chloride 98 98 - 111 mmol/L   CO2 27 22 - 32 mmol/L   Glucose, Bld 117 (H) 70 - 99 mg/dL    Comment: Glucose reference range applies only to samples taken after fasting for at least 8 hours.   BUN 18 8 - 23 mg/dL   Creatinine, Ser 0.82 0.44 - 1.00 mg/dL   Calcium 9.3 8.9 - 10.3 mg/dL   GFR, Estimated >60 >60 mL/min    Comment: (NOTE) Calculated using the CKD-EPI Creatinine Equation  (2021)    Anion gap 9 5 - 15    Comment: Performed at [facility], [address] [place], [place] [address]  CBC     Status: Abnormal   Collection Time: 08/20/20  4:29 AM  Result Value Ref Range   WBC 10.2 4.0 - 10.5 K/uL   RBC 2.90 (L) 3.87 - 5.11 MIL/uL   Hemoglobin 8.9 (L) 12.0 - 15.0 g/dL   HCT 26.7 (L) 36.0 - 46.0 %   MCV 92.1 80.0 - 100.0 fL   MCH 30.7 26.0 - 34.0 pg   MCHC 33.3 30.0 - 36.0 g/dL   RDW 18.7 (H) 11.5 - 15.5 %   Platelets 65 (L) 150 - 400 K/uL    Comment: Immature Platelet Fraction may be clinically indicated, consider ordering this additional test [id number]    nRBC 0.2 0.0 - 0.2 %    Comment: Performed at [facility], [address] [place], [place] [address]  Basic metabolic panel     Status: Abnormal   Collection Time: 08/20/20  4:29 AM  Result Value Ref Range   Sodium 135 135 - 145 mmol/L   Potassium 3.8 3.5 - 5.1 mmol/L   Chloride 98 98 - 111 mmol/L   CO2 27 22 - 32 mmol/L   Glucose, Bld 124 (H) 70 - 99 mg/dL    Comment: Glucose reference range applies only to samples taken after fasting for at least 8 hours.   BUN 18 8 - 23 mg/dL   Creatinine, Ser 0.87 0.44 - 1.00 mg/dL   Calcium 9.2 8.9 - 10.3 mg/dL   GFR, Estimated >60 >60 mL/min    Comment: (NOTE) Calculated using the CKD-EPI Creatinine Equation (2021)    Anion gap 10 5 - 15    Comment: Performed at [facility], [address] [place], [place] [address]  Folate     Status: Abnormal   Collection Time: 08/20/20  4:29 AM  Result Value Ref Range   Folate 5.1 (L) >5.9 ng/mL    Comment: Performed at [facility], [place], [place] [address]  Iron and TIBC     Status: None   Collection Time: 08/20/20  4:29 AM  Result Value Ref Range   Iron 81 28 - 170 ug/dL   TIBC 330 250 - 450 ug/dL   Saturation Ratios 25 10.4 - 31.8 %   UIBC 249 ug/dL    Comment: Performed at [facility],  [address], [place], [place] [address]  Ferritin     Status: None   Collection Time: 08/20/20  4:29 AM  Result Value Ref Range   Ferritin 271 11 - 307 ng/mL    Comment: Performed at [facility], [place], [place] [address]  Reticulocytes     Status: Abnormal   Collection Time: 08/20/20  4:29 AM  Result Value Ref Range   Retic Ct Pct 5.9 (H) 0.4 - 3.1 %   RBC. 2.99 (L) 3.87 - 5.11 MIL/uL   Retic Count, Absolute 175.8 19.0 - 186.0 K/uL   Immature Retic Fract 38.6 (H) 2.3 - 15.9 %    Comment: Performed at [facility], [address] [place], [place] [address]      Imaging Results (Last 48 hours)  No results found.    Review of Systems Blood pressure 136/60, pulse (!) 103, temperature 97.7 F (36.5 C), temperature source Oral, resp. rate 20, height 5\' 4"  (1.626 m), weight 74.3 kg, SpO2 100 %. Physical Exam Constitutional:      Appearance: She is normal weight.  HENT:     Head: Normocephalic.  Cardiovascular:     Rate and Rhythm: Normal rate and regular rhythm.     Heart sounds: Normal heart sounds.  Pulmonary:     Breath sounds: No wheezing or rales.     Comments: Mild dyspnea during speech (7 word).  Mild decrease LLL breath sounds.  Chest:  Breasts:     Left: Axillary adenopathy (axillary incision well healed. No seroma formation. ) present.    Musculoskeletal:     Cervical back: Normal range of motion.  Lymphadenopathy:     Upper Body:     Left upper body: Axillary adenopathy (axillary incision well healed. No seroma formation. ) present.  Neurological:     Mental Status: She is alert.  Psychiatric:        Attention and Perception: Attention normal.        Mood and Affect: Mood normal.        Speech: Speech normal.        Behavior: Behavior normal.     Laboratory/ radiologic assessment:  Serial CXR exams from 08/01/20 thru this AM reviewed.  Resolution of most LLL findings over the last two  studies.  OX: 100% RA at rest.  Ref Range & Units 04:29  Sodium 135 - 145 mmol/L 135   Potassium 3.5 - 5.1 mmol/L 3.8   Chloride 98 - 111 mmol/L 98   CO2 22 - 32 mmol/L 27   Glucose, Bld 70 - 99 mg/dL 124High   Comment: Glucose reference range applies only to samples taken after fasting for at least 8 hours.  BUN 8 - 23 mg/dL 18   Creatinine, Ser 0.44 - 1.00 mg/dL 0.87   Calcium 8.9 - 10.3 mg/dL 9.2   GFR, Estimated >60 mL/min >60   Comment: (NOTE)  Calculated using the CKD-EPI Creatinine Equation (2021)   Anion gap 5 - 15 10    WBC 4.0 - 10.5 K/uL 10.2   RBC 3.87 - 5.11 MIL/uL 2.90Low   Hemoglobin 12.0 - 15.0 g/dL 8.9Low   HCT 36.0 - 46.0 % 26.7Low   MCV 80.0 - 100.0 fL 92.1   MCH 26.0 - 34.0 pg 30.7   MCHC 30.0 - 36.0 g/dL 33.3   RDW 11.5 - 15.5 % 18.7High   Platelets 150 - 400 K/uL 65Low    Resolution of hyponatremia  present on admission.  Stable thrombocytopenia secondary to splenomegaly.    Plan: Reviewed Power Port placement.  Risks reviewed (previously discussed at on August 12, 2020 office visit.   Tentatively scheduled for Friday AM, 8:30. NPO after MN except meds.    The patient will not require additional preoperative antibiotics if AM dose April 1,2022 can be administered preoperatively.   [place] Gleason Lester Platas 08/20/2020, 11:08 AM

## 2020-08-22 ENCOUNTER — Other Ambulatory Visit: Payer: Self-pay

## 2020-08-22 ENCOUNTER — Ambulatory Visit: Payer: Medicare HMO

## 2020-08-22 ENCOUNTER — Emergency Department: Payer: Medicare HMO

## 2020-08-22 ENCOUNTER — Encounter
Admission: RE | Disposition: A | Discharge status: Home / Self Care | Payer: Self-pay | Source: Ambulatory Visit | Attending: General Surgery

## 2020-08-22 ENCOUNTER — Ambulatory Visit: Payer: Medicare HMO | Admitting: Certified Registered"

## 2020-08-22 ENCOUNTER — Other Ambulatory Visit: Payer: Self-pay | Admitting: Hematology and Oncology

## 2020-08-22 ENCOUNTER — Inpatient Hospital Stay
Admission: EM | Admit: 2020-08-22 | Discharge: 2020-08-24 | DRG: 824 | Disposition: A | Discharge status: Home / Self Care | Payer: Medicare HMO | Attending: Family Medicine | Admitting: Family Medicine

## 2020-08-22 ENCOUNTER — Encounter: Payer: Self-pay | Admitting: General Surgery

## 2020-08-22 ENCOUNTER — Telehealth: Payer: Self-pay | Admitting: Hematology and Oncology

## 2020-08-22 ENCOUNTER — Encounter: Payer: Self-pay | Admitting: Hematology and Oncology

## 2020-08-22 ENCOUNTER — Ambulatory Visit
Admission: RE | Admit: 2020-08-22 | Discharge: 2020-08-22 | Disposition: A | Discharge status: Home / Self Care | Payer: Medicare HMO | Source: Ambulatory Visit | Attending: General Surgery | Admitting: General Surgery

## 2020-08-22 DIAGNOSIS — I10 Essential (primary) hypertension: Secondary | ICD-10-CM | POA: Insufficient documentation

## 2020-08-22 DIAGNOSIS — Z888 Allergy status to other drugs, medicaments and biological substances status: Secondary | ICD-10-CM

## 2020-08-22 DIAGNOSIS — I110 Hypertensive heart disease with heart failure: Secondary | ICD-10-CM | POA: Diagnosis present

## 2020-08-22 DIAGNOSIS — Z79890 Hormone replacement therapy (postmenopausal): Secondary | ICD-10-CM

## 2020-08-22 DIAGNOSIS — C8219 Follicular lymphoma grade II, extranodal and solid organ sites: Principal | ICD-10-CM | POA: Diagnosis present

## 2020-08-22 DIAGNOSIS — E871 Hypo-osmolality and hyponatremia: Secondary | ICD-10-CM | POA: Diagnosis present

## 2020-08-22 DIAGNOSIS — E890 Postprocedural hypothyroidism: Secondary | ICD-10-CM | POA: Insufficient documentation

## 2020-08-22 DIAGNOSIS — C8590 Non-Hodgkin lymphoma, unspecified, unspecified site: Secondary | ICD-10-CM | POA: Insufficient documentation

## 2020-08-22 DIAGNOSIS — E785 Hyperlipidemia, unspecified: Secondary | ICD-10-CM | POA: Insufficient documentation

## 2020-08-22 DIAGNOSIS — R590 Localized enlarged lymph nodes: Secondary | ICD-10-CM | POA: Diagnosis present

## 2020-08-22 DIAGNOSIS — Z20822 Contact with and (suspected) exposure to covid-19: Secondary | ICD-10-CM | POA: Diagnosis present

## 2020-08-22 DIAGNOSIS — Z79899 Other long term (current) drug therapy: Secondary | ICD-10-CM | POA: Insufficient documentation

## 2020-08-22 DIAGNOSIS — Z90710 Acquired absence of both cervix and uterus: Secondary | ICD-10-CM

## 2020-08-22 DIAGNOSIS — R0600 Dyspnea, unspecified: Secondary | ICD-10-CM | POA: Diagnosis present

## 2020-08-22 DIAGNOSIS — Z95828 Presence of other vascular implants and grafts: Secondary | ICD-10-CM

## 2020-08-22 DIAGNOSIS — I5032 Chronic diastolic (congestive) heart failure: Secondary | ICD-10-CM | POA: Diagnosis present

## 2020-08-22 DIAGNOSIS — Z9889 Other specified postprocedural states: Secondary | ICD-10-CM

## 2020-08-22 DIAGNOSIS — J910 Malignant pleural effusion: Secondary | ICD-10-CM

## 2020-08-22 DIAGNOSIS — J90 Pleural effusion, not elsewhere classified: Secondary | ICD-10-CM | POA: Diagnosis present

## 2020-08-22 HISTORY — PX: PORTACATH PLACEMENT: SHX2246

## 2020-08-22 LAB — PROCALCITONIN: Procalcitonin: 0.16 ng/mL

## 2020-08-22 LAB — COMPREHENSIVE METABOLIC PANEL
ALT: 16 U/L (ref 0–44)
AST: 29 U/L (ref 15–41)
Albumin: 3.8 g/dL (ref 3.5–5.0)
Alkaline Phosphatase: 79 U/L (ref 38–126)
Anion gap: 11 (ref 5–15)
BUN: 18 mg/dL (ref 8–23)
CO2: 24 mmol/L (ref 22–32)
Calcium: 9.3 mg/dL (ref 8.9–10.3)
Chloride: 96 mmol/L — ABNORMAL LOW (ref 98–111)
Creatinine, Ser: 0.67 mg/dL (ref 0.44–1.00)
GFR, Estimated: >60 mL/min (ref >60)
Glucose, Bld: 123 mg/dL — ABNORMAL HIGH (ref 70–99)
Potassium: 4.2 mmol/L (ref 3.5–5.1)
Sodium: 131 mmol/L — ABNORMAL LOW (ref 135–145)
Total Bilirubin: 1.2 mg/dL (ref 0.3–1.2)
Total Protein: 7.1 g/dL (ref 6.5–8.1)

## 2020-08-22 LAB — CBC WITH DIFFERENTIAL/PLATELET
Abs Immature Granulocytes: 0.45 10*3/uL — ABNORMAL HIGH (ref 0.00–0.07)
Basophils Absolute: 0.1 10*3/uL (ref 0.0–0.1)
Basophils Relative: 1 %
Eosinophils Absolute: 0.2 10*3/uL (ref 0.0–0.5)
Eosinophils Relative: 2 %
HCT: 27.6 % — ABNORMAL LOW (ref 36.0–46.0)
Hemoglobin: 9 g/dL — ABNORMAL LOW (ref 12.0–15.0)
Immature Granulocytes: 4 %
Lymphocytes Relative: 23 %
Lymphs Abs: 2.5 10*3/uL (ref 0.7–4.0)
MCH: 30.4 pg (ref 26.0–34.0)
MCHC: 32.6 g/dL (ref 30.0–36.0)
MCV: 93.2 fL (ref 80.0–100.0)
Monocytes Absolute: 2 10*3/uL — ABNORMAL HIGH (ref 0.1–1.0)
Monocytes Relative: 19 %
Neutro Abs: 5.6 10*3/uL (ref 1.7–7.7)
Neutrophils Relative %: 51 %
Platelets: 78 10*3/uL — ABNORMAL LOW (ref 150–400)
RBC: 2.96 MIL/uL — ABNORMAL LOW (ref 3.87–5.11)
RDW: 19.8 % — ABNORMAL HIGH (ref 11.5–15.5)
Smear Review: NORMAL
WBC: 10.8 10*3/uL — ABNORMAL HIGH (ref 4.0–10.5)
nRBC: 0.3 % — ABNORMAL HIGH (ref 0.0–0.2)

## 2020-08-22 LAB — TROPONIN I (HIGH SENSITIVITY)
Troponin I (High Sensitivity): 3 ng/L (ref <18)
Troponin I (High Sensitivity): 4 ng/L (ref <18)

## 2020-08-22 LAB — CULTURE, BLOOD (ROUTINE X 2): Special Requests: ADEQUATE

## 2020-08-22 LAB — BRAIN NATRIURETIC PEPTIDE: B Natriuretic Peptide: 86.4 pg/mL (ref 0.0–100.0)

## 2020-08-22 SURGERY — INSERTION, TUNNELED CENTRAL VENOUS DEVICE, WITH PORT
Anesthesia: General | Site: Chest | Laterality: Left

## 2020-08-22 MED ORDER — MIDAZOLAM HCL 2 MG/2ML IJ SOLN
INTRAMUSCULAR | Status: DC | PRN
  Administered 2020-08-22: 2 mg via INTRAVENOUS

## 2020-08-22 MED ORDER — LACTATED RINGERS IV SOLN: INTRAVENOUS | Status: DC

## 2020-08-22 MED ORDER — DEXMEDETOMIDINE (PRECEDEX) IN NS 20 MCG/5ML (4 MCG/ML) IV SYRINGE
PREFILLED_SYRINGE | INTRAVENOUS | Status: DC | PRN
  Administered 2020-08-22: 8 ug via INTRAVENOUS

## 2020-08-22 MED ORDER — CEFAZOLIN SODIUM-DEXTROSE 2-4 GM/100ML-% IV SOLN
INTRAVENOUS | Status: AC
  Filled 2020-08-22: qty 100

## 2020-08-22 MED ORDER — LIDOCAINE HCL (PF) 1 % IJ SOLN
INTRAMUSCULAR | Status: AC
  Filled 2020-08-22: qty 30

## 2020-08-22 MED ORDER — LIDOCAINE HCL (CARDIAC) PF 100 MG/5ML IV SOSY
PREFILLED_SYRINGE | INTRAVENOUS | Status: DC | PRN
  Administered 2020-08-22: 50 mg via INTRAVENOUS

## 2020-08-22 MED ORDER — FENTANYL CITRATE (PF) 100 MCG/2ML IJ SOLN
INTRAMUSCULAR | Status: AC
  Filled 2020-08-22: qty 2

## 2020-08-22 MED ORDER — IOHEXOL 350 MG/ML SOLN
75.0000 mL | Freq: Once | INTRAVENOUS | Status: AC | PRN
  Administered 2020-08-22: 75 mL via INTRAVENOUS

## 2020-08-22 MED ORDER — CHLORHEXIDINE GLUCONATE 0.12 % MT SOLN
OROMUCOSAL | Status: AC
  Filled 2020-08-22: qty 15

## 2020-08-22 MED ORDER — PROPOFOL 500 MG/50ML IV EMUL
INTRAVENOUS | Status: DC | PRN
  Administered 2020-08-22: 50 ug/kg/min via INTRAVENOUS

## 2020-08-22 MED ORDER — OXYCODONE HCL 5 MG/5ML PO SOLN: 5.0000 mg | Freq: Once | ORAL | Status: DC | PRN

## 2020-08-22 MED ORDER — OXYCODONE HCL 5 MG PO TABS: 5.0000 mg | ORAL_TABLET | Freq: Once | ORAL | Status: DC | PRN

## 2020-08-22 MED ORDER — MIDAZOLAM HCL 2 MG/2ML IJ SOLN
INTRAMUSCULAR | Status: AC
  Filled 2020-08-22: qty 2

## 2020-08-22 MED ORDER — FENTANYL CITRATE (PF) 100 MCG/2ML IJ SOLN: 25.0000 ug | INTRAMUSCULAR | Status: DC | PRN

## 2020-08-22 MED ORDER — FENTANYL CITRATE (PF) 100 MCG/2ML IJ SOLN
INTRAMUSCULAR | Status: DC | PRN
  Administered 2020-08-22: 50 ug via INTRAVENOUS
  Administered 2020-08-22 (×2): 25 ug via INTRAVENOUS

## 2020-08-22 MED ORDER — CHLORHEXIDINE GLUCONATE 0.12 % MT SOLN: 15.0000 mL | Freq: Once | OROMUCOSAL | Status: DC

## 2020-08-22 MED ORDER — PROPOFOL 500 MG/50ML IV EMUL
INTRAVENOUS | Status: AC
  Filled 2020-08-22: qty 50

## 2020-08-22 MED ORDER — ORAL CARE MOUTH RINSE: 15.0000 mL | Freq: Once | OROMUCOSAL | Status: DC

## 2020-08-22 MED ORDER — BUPIVACAINE HCL 0.5 % IJ SOLN
INTRAMUSCULAR | Status: DC | PRN
Start: 2020-08-22 — End: 2020-08-22
  Administered 2020-08-22: 10 mL

## 2020-08-22 SURGICAL SUPPLY — 33 items
APL PRP STRL LF DISP 70% ISPRP (MISCELLANEOUS) ×1
BAG DECANTER FOR FLEXI CONT (MISCELLANEOUS) ×2 IMPLANT
BLADE SURG 15 STRL SS SAFETY (BLADE) ×2 IMPLANT
CHLORAPREP W/TINT 26 (MISCELLANEOUS) ×2 IMPLANT
COVER LIGHT HANDLE STERIS (MISCELLANEOUS) ×4 IMPLANT
COVER WAND RF STERILE (DRAPES) ×2 IMPLANT
DECANTER SPIKE VIAL GLASS SM (MISCELLANEOUS) ×4 IMPLANT
DRAPE C-ARM XRAY 36X54 (DRAPES) ×2 IMPLANT
DRAPE LAPAROTOMY TRNSV 106X77 (MISCELLANEOUS) ×2 IMPLANT
DRSG TEGADERM 2-3/8X2-3/4 SM (GAUZE/BANDAGES/DRESSINGS) ×2 IMPLANT
DRSG TEGADERM 4X4.75 (GAUZE/BANDAGES/DRESSINGS) ×2 IMPLANT
DRSG TELFA 4X3 1S NADH ST (GAUZE/BANDAGES/DRESSINGS) ×2 IMPLANT
ELECT REM PT RETURN 9FT ADLT (ELECTROSURGICAL) ×2
ELECTRODE REM PT RTRN 9FT ADLT (ELECTROSURGICAL) ×1 IMPLANT
GLOVE SURG ENC MOIS LTX SZ7.5 (GLOVE) ×2 IMPLANT
GLOVE SURG UNDER LTX SZ8 (GLOVE) ×2 IMPLANT
GOWN STRL REUS W/ TWL LRG LVL3 (GOWN DISPOSABLE) ×2 IMPLANT
GOWN STRL REUS W/TWL LRG LVL3 (GOWN DISPOSABLE) ×4
IV NS 500ML (IV SOLUTION) ×2
IV NS 500ML BAXH (IV SOLUTION) ×1 IMPLANT
KIT PORT POWER 8FR ISP CVUE (Port) ×2 IMPLANT
KIT TURNOVER KIT A (KITS) ×2 IMPLANT
LABEL OR SOLS (LABEL) ×2 IMPLANT
MANIFOLD NEPTUNE II (INSTRUMENTS) ×2 IMPLANT
PACK PORT-A-CATH (MISCELLANEOUS) ×2 IMPLANT
STRIP CLOSURE SKIN 1/2X4 (GAUZE/BANDAGES/DRESSINGS) ×2 IMPLANT
SUT PROLENE 3 0 SH DA (SUTURE) ×2 IMPLANT
SUT VIC AB 3-0 SH 27 (SUTURE) ×2
SUT VIC AB 3-0 SH 27X BRD (SUTURE) ×1 IMPLANT
SUT VIC AB 4-0 FS2 27 (SUTURE) ×2 IMPLANT
SWABSTK COMLB BENZOIN TINCTURE (MISCELLANEOUS) ×2 IMPLANT
SYR 10ML LL (SYRINGE) ×2 IMPLANT
SYR 10ML SLIP (SYRINGE) ×2 IMPLANT

## 2020-08-22 NOTE — ED Notes (Signed)
Pt discussed with Md Quale

## 2020-08-22 NOTE — ED Triage Notes (Signed)
Pt to ER with complaints of shortness of breath after having a port placed this morning. Pt reports prior to this having a continuous dry cough and a previous diagnosis of pneumonia. Pt with metastatic cancer. Denies CP.   Pt with labored breathing in triage, requires breaks between sentences to take deep breaths. O2 sats 97% on room air. Pt pale. Skin dry.

## 2020-08-22 NOTE — ED Provider Notes (Signed)
[facility] Emergency Department Provider Note  ____________________________________________   Event Date/Time   First MD Initiated Contact with Patient 08/22/20 2150     (approximate)  I have reviewed the triage vital signs and the nursing notes.   HISTORY  Chief Complaint Shortness of Breath    HPI Natasha Farrell is a 75 y.o. female with hypertension, hyperlipidemia, thyroidectomy who comes in for shortness of breath.  On March 25 patient underwent a thoracentesis of her left pleural effusion had 2 L removed and afterwards developed flash pulmonary edema needed to be admitted.  Patient was admitted but discharge note is not in yet.  She was discharged 2 days ago.  She had a port placed today then she states afterwards she started feeling more short of breath and having more coughing that was constant, worse with exertion, better at rest.  They are planning to start her on chemotherapy for lymphoma soon.          Past Medical History:  Diagnosis Date  . Hx of hysterectomy   . Hyperlipidemia   . Hypertension   . Thyroid disease    rt thryroidectomy  09/25/2010    Patient Active Problem List   Diagnosis Date Noted  . 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 Acute on chronic respiratory failure with hypoxia ([employer]) 08/15/2020  . Acute respiratory failure with hypoxia ([place]) 08/15/2020  . CAP (community acquired pneumonia) 08/15/2020  . Severe sepsis ([place]) 08/15/2020  . Hyperlipidemia   . Chronic diastolic CHF (congestive heart failure) ([place])   . Hyponatremia   . Pleural effusion, left 08/14/2020  . Goals of care, counseling/discussion 08/14/2020  . Thrombocytopenia ([place]) 08/14/2020  . Adenopathy 07/26/2020  . Splenomegaly 07/26/2020  . Sinus tachycardia 07/23/2020  . Shortness of breath 07/23/2020  . Chest pain of uncertain etiology 85/88/5027  . Left upper quadrant abdominal pain 07/23/2020  .  Essential hypertension 07/23/2020  . Thyroid nodule, uninodular 12/09/2010    Past Surgical History:  Procedure Laterality Date  . BREAST BIOPSY Right 04/21/2020   hydromark 3 [place] bx path pending  . LYMPH NODE BIOPSY Left 08/01/2020   Procedure: LYMPH NODE BIOPSY;  Surgeon: Robert Bellow, MD;  Location: [facility] ORS;  Service: General;  Laterality: Left;    Prior to Admission medications   Medication Sig Start Date End Date Taking? Authorizing Provider  acetaminophen (TYLENOL) 500 MG tablet Take 500 mg by mouth every 6 (six) hours as needed for moderate pain, fever or headache.    [provider]  allopurinol (ZYLOPRIM) 300 MG tablet Take 1 tablet (300 mg total) by mouth daily. 07/28/20   Lequita Asal, MD  carvedilol (COREG) 25 MG tablet Take 1 tablet (25 mg total) by mouth 2 (two) times daily. 08/06/20 08/01/21  Furth, Cadence H, PA-C  cholecalciferol (VITAMIN D3) 25 MCG (1000 UNIT) tablet Take 2,000 Units by mouth daily.    [provider]  feeding supplement (ENSURE ENLIVE / ENSURE PLUS) LIQD Take 237 mLs by mouth 2 (two) times daily between meals. 08/20/20   Enzo Bi, MD  loratadine (CLARITIN) 10 MG tablet Take 10 mg by mouth daily as needed for allergies.    [provider]  Multiple Vitamin (MULTIVITAMIN WITH MINERALS) TABS tablet Take 1 tablet by mouth daily. 08/21/20   Enzo Bi, MD  omeprazole (PRILOSEC) 20 MG capsule Take 1 capsule (20 mg total) by mouth daily. 08/21/20   Nolon Stalls  C, MD  ondansetron (ZOFRAN) 8 MG tablet Take 1 tablet (8 mg total) by mouth every 8 (eight) hours as needed for nausea or vomiting. 08/21/20   Lequita Asal, MD  pravastatin (PRAVACHOL) 40 MG tablet Take 40 mg by mouth daily.    [provider]  sodium chloride 1 g tablet Take 1 tablet (1 g total) by mouth 2 (two) times daily with a meal. 08/20/20 09/19/20  Enzo Bi, MD  spironolactone (ALDACTONE) 50 MG tablet Take 50 mg by mouth daily. 07/25/20   [provider]  SYNTHROID 50 MCG tablet Take 1 tablet (50 mcg total) by mouth daily. 12/09/10 12/09/11  Armandina Gemma, MD    Allergies Lisinopril  Family History  Problem Relation Age of Onset  . Breast cancer Sister 107  . Colon cancer Sister   . Kidney cancer Sister     Social History Social History   Tobacco Use  . Smoking status: Never Smoker  . Smokeless tobacco: Never Used  Vaping Use  . Vaping Use: Never used  Substance Use Topics  . Alcohol use: No  . Drug use: No      Review of Systems Constitutional: No fever/chills Eyes: No visual changes. ENT: No sore throat. Cardiovascular: Denies chest pain. Respiratory: Positive shortness of breath and cough Gastrointestinal: No abdominal pain.  No nausea, no vomiting.  No diarrhea.  No constipation. Genitourinary: Negative for dysuria. Musculoskeletal: Negative for back pain. Skin: Negative for rash. Neurological: Negative for headaches, focal weakness or numbness. All other ROS negative ____________________________________________   PHYSICAL EXAM:  VITAL SIGNS: ED Triage Vitals  Enc Vitals Group     BP 08/22/20 1856 119/78     Pulse Rate 08/22/20 1856 99     Resp 08/22/20 1856 (!) 26     Temp 08/22/20 1856 98.2 F (36.8 C)     Temp Source 08/22/20 1856 Oral     SpO2 08/22/20 1856 99 %     Weight 08/22/20 1857 165 lb 5.5 oz (75 kg)     Height 08/22/20 1857 5\' 4"  (1.626 m)     Head Circumference --      Peak Flow --      Pain Score 08/22/20 1857 0     Pain Loc --      Pain Edu? --      Excl. in [place]? --     Constitutional: Alert and oriented. Well appearing and in no acute distress. Eyes: Conjunctivae are normal. EOMI. Head: Atraumatic. Nose: No congestion/rhinnorhea. Mouth/Throat: Mucous membranes are moist.   Neck: No stridor. Trachea Midline. FROM Cardiovascular: Normal rate, regular rhythm. Grossly normal heart sounds.  Good peripheral circulation.  Port noted Respiratory: Normal respiratory  effort.  No retractions. Lungs CTAB. Gastrointestinal: Soft and nontender. No distention. No abdominal bruits.  Musculoskeletal: No lower extremity tenderness nor edema.  No joint effusions. Neurologic:  Normal speech and language. No gross focal neurologic deficits are appreciated.  Skin:  Skin is warm, dry and intact. No rash noted. Psychiatric: Mood and affect are normal. Speech and behavior are normal. GU: Deferred   ____________________________________________   LABS (all labs ordered are listed, but only abnormal results are displayed)  Labs Reviewed  CBC WITH DIFFERENTIAL/PLATELET - Abnormal; Notable for the following components:      Result Value   WBC 10.8 (*)    RBC 2.96 (*)    Hemoglobin 9.0 (*)    HCT 27.6 (*)    RDW 19.8 (*)  Platelets 78 (*)    nRBC 0.3 (*)    Monocytes Absolute 2.0 (*)    Abs Immature Granulocytes 0.45 (*)    All other components within normal limits  COMPREHENSIVE METABOLIC PANEL - Abnormal; Notable for the following components:   Sodium 131 (*)    Chloride 96 (*)    Glucose, Bld 123 (*)    All other components within normal limits  TROPONIN I (HIGH SENSITIVITY)  TROPONIN I (HIGH SENSITIVITY)   ____________________________________________   ED ECG REPORT I, Vanessa Wendell, the attending physician, personally viewed and interpreted this ECG.  Sinus tachycardia rate of 101, no ST elevation, no T wave inversions, normal intervals ____________________________________________  RADIOLOGY Robert Bellow, personally viewed and evaluated these images (plain radiographs) as part of my medical decision making, as well as reviewing the written report by the radiologist.  ED MD interpretation: Effusions that are larger than her priors at discharge  Official radiology report(s): DG Chest 2 View  Result Date: 08/22/2020 CLINICAL DATA:  Shortness of breath EXAM: CHEST - 2 VIEW COMPARISON:  08/22/2020 FINDINGS: Left Port-A-Cath remains in place,  unchanged. Moderate left pleural effusion. Left basilar opacity, likely atelectasis. No pneumothorax. Small right pleural effusion with right base atelectasis. Heart is normal size. IMPRESSION: Left Port-A-Cath in place with the tip in the SVC.  Unchanged. Moderate left pleural effusion and small right pleural effusion. Bibasilar atelectasis, left greater than right. Electronically Signed   By: Rolm Baptise M.D.   On: 08/22/2020 19:25   DG Chest Port 1 View  Result Date: 08/22/2020 CLINICAL DATA:  Lymphoma, port catheter replacement EXAM: PORTABLE CHEST 1 VIEW COMPARISON:  08/20/2020, 08/22/2020 FINDINGS: Left subclavian power port catheter tip has retracted from the intraoperative comparison found to the innominate/proximal SVC level. Negative for pneumothorax. Trachea midline. Stable heart size and vascularity. Moderate left effusion with associated partial left lower lung collapse/consolidation. Very small right effusion. No acute or abnormal osseous finding. IMPRESSION: Left subclavian power port catheter tip has retracted to the innominate SVC junction level. Pleural effusions, larger on the left No pneumothorax Electronically Signed   By: Jerilynn Mages.  Shick M.D.   On: 08/22/2020 09:55   DG C-Arm 1-60 Min-No Report  Result Date: 08/22/2020 Fluoroscopy was utilized by the requesting physician.  No radiographic interpretation.    ____________________________________________   PROCEDURES  Procedure(s) performed (including Critical Care):  .1-3 Lead EKG Interpretation Performed by: Vanessa [place], MD Authorized by: Vanessa Becker, MD     Interpretation: abnormal     ECG rate:  100s   ECG rate assessment: tachycardic     Rhythm: sinus tachycardia     Ectopy: none     Conduction: normal       ____________________________________________   INITIAL IMPRESSION / ASSESSMENT AND PLAN / ED COURSE  LAURAN ROMANSKI was evaluated in Emergency Department on 08/22/2020 for the symptoms described in the  history of present illness. She was evaluated in the context of the global COVID-19 pandemic, which necessitated consideration that the patient might be at risk for infection with the SARS-CoV-2 virus that causes COVID-19. Institutional protocols and algorithms that pertain to the evaluation of patients at risk for COVID-19 are in a state of rapid change based on information released by regulatory bodies including the CDC and federal and state organizations. These policies and algorithms were followed during the patient's care in the ED.    Patient comes in tachycardic with shortness of breath in the setting of  known pleural effusions however she is being treated for lymphoma will get CT PE to evaluate for underlying pneumonia, pulmonary embolism.  Will get BNP to evaluate for heart function and cardiac markers evaluate for ACS  Labs are reassuring.  Hemoglobin at baseline, platelets at baseline, CT scan without evidence of PE also incidental findings that patient previously had on her prior PET CT.  Given patient's large effusions with worsening symptoms we will discuss to the hospital team for admission given she will require repeat thoracentesis     ____________________________________________   FINAL CLINICAL IMPRESSION(S) / ED DIAGNOSES   Final diagnoses:  Malignant pleural effusion      MEDICATIONS GIVEN DURING THIS VISIT:  Medications  iohexol (OMNIPAQUE) 350 MG/ML injection 75 mL (75 mLs Intravenous Contrast Given 08/22/20 2304)     ED Discharge Orders    None       Note:  This document was prepared using Dragon voice recognition software and may include unintentional dictation errors.   Vanessa Odessa, MD 08/22/20 2348

## 2020-08-22 NOTE — Op Note (Signed)
Preoperative diagnosis: Lymphoma, need for central venous access.  Postoperative diagnosis: Same.  Operative procedure: Placement left subclavian PowerPort with ultrasound and fluoroscopic guidance.  Operating surgeon: Hervey Ard, MD.  Anesthesia: Attended local, 10 cc 1% plain Xylocaine.  Estimated blood loss: Less than 5 cc.  Clinical note: This 75 year old woman is recently been diagnosed with lymphoma.  She is a candidate for chemotherapy and central venous access has been requested by her treating oncologist.  Platelet count is 66,000 but she had no bleeding after an open left axillary node biopsy 3 weeks ago.  She received Ancef prior to the procedure.  S TD stockings were not required due to positioning and time of surgery.  Operative note: With the patient under adequate sedation the area of the left neck and chest was cleansed with ChloraPrep and draped.  Ultrasound was used to confirm location and patency of the left subclavian vein.  Local anesthesia was infiltrated and the vein was cannulated under ultrasound guidance.  The guidewire was passed and fluoroscopy showed it was in the IVC.  The dilator was placed followed by the catheter itself.  Under fluoroscopy this was positioned at the junction of the SVC and right atrium.  The catheter easily aspirated and irrigated in this position.  The port was tunneled to a pocket on the left anterior chest.  The port was anchored to the deep tissue with 3-0 Prolene sutures x2.  The wound was closed with a running 3-0 Vicryl suture to the adipose layer and a running 4-0 Vicryl subcuticular suture for the skin.  The port was cannulated and it easily aspirated and irrigated.  10 cc flushed with injectable saline.  Benzoin and Steri-Strips followed by Telfa and Tegaderm dressings were applied.  Portable chest x-ray was obtained in the recovery room.  This showed the catheter had retracted in the SVC.  No pneumothorax.

## 2020-08-22 NOTE — ED Notes (Signed)
Patient transported to CT 

## 2020-08-22 NOTE — Telephone Encounter (Signed)
Dr. Loletha Grayer- Please advise scheduling on setting up tx plan/visit.

## 2020-08-22 NOTE — Transfer of Care (Signed)
Immediate Anesthesia Transfer of Care Note  Patient: Natasha Farrell  Procedure(s) Performed: INSERTION PORT-A-CATH (Left Chest)  Patient Location: PACU  Anesthesia Type:General  Level of Consciousness: awake, alert  and oriented  Airway & Oxygen Therapy: Patient Spontanous Breathing  Post-op Assessment: Report given to RN and Post -op Vital signs reviewed and stable  Post vital signs: Reviewed  Last Vitals:  Vitals Value Taken Time  BP 125/53 08/22/20 0930  Temp 37.3 C 08/22/20 0930  Pulse 97 08/22/20 0932  Resp 21 08/22/20 0932  SpO2 95 % 08/22/20 0932  Vitals shown include unvalidated device data.  Last Pain:  Vitals:   08/22/20 0742  TempSrc: Temporal  PainSc: 0-No pain         Complications: No complications documented.

## 2020-08-22 NOTE — Anesthesia Postprocedure Evaluation (Signed)
Anesthesia Post Note  Patient: Natasha Farrell  Procedure(s) Performed: INSERTION PORT-A-CATH (Left Chest)  Patient location during evaluation: PACU Anesthesia Type: General Level of consciousness: awake and alert Pain management: pain level controlled Vital Signs Assessment: post-procedure vital signs reviewed and stable Respiratory status: spontaneous breathing, nonlabored ventilation and respiratory function stable Cardiovascular status: blood pressure returned to baseline and stable Postop Assessment: no apparent nausea or vomiting Anesthetic complications: no   No complications documented.   Last Vitals:  Vitals:   08/22/20 0955 08/22/20 1006  BP: 137/74 (!) 152/67  Pulse: 99 (!) 106  Resp: (!) 26 20  Temp: 36.8 C 36.9 C  SpO2: 97% 94%    Last Pain:  Vitals:   08/22/20 1006  TempSrc: Temporal  PainSc: 0-No pain                 Brett Canales Janelly Switalski

## 2020-08-22 NOTE — Telephone Encounter (Signed)
Re:  Diagnosis and plan for treatment  Today I spoke with the patient and her husband regarding her diagnosis.  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  I reviewed her pathology and PET scan with Dr Corey Harold, lymphoma specialist at [facility].  We discussed her extensive disease and relatively high SUVs on her scan.  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  We discussed both bendamustine and Rituxan as well as RCHOP.  Both regimens are appropriate for follicular grade 2 lymphoma, but RCHOP is a better choice for a grade 3 lymphoma or possible transformation to a high grade lymphoma such as diffuse large B cell lymphoma.  Given her marked symptomatology, extensive disease, and relativey high SUVs on PET scan, I discussed RCHOP chemotherapy in detail with the patient and her husband.  We discussed myelosuppression, nausea, vomiting, cardiotoxicity, risk for tumor lysis, foot drop, and constipation.  I spoke with cardiology yesterday who felt that she was a candidate for an anthracycline based on her EF 60-65% with her recent echocardiogram dated 07/24/2020.  I discussed that cycle #1 may be difficult given her extensive disease and low counts.  She will be monitored closely.  The patient and her husband felt comfortable proceeding with R-CHOP chemotherapy.  She will be scheduled for chemotherapy class.  Orders written.   Lequita Asal, MD

## 2020-08-22 NOTE — Anesthesia Preprocedure Evaluation (Signed)
Anesthesia Evaluation  Patient identified by MRN, date of birth, ID band Patient awake    Reviewed: Allergy & Precautions, H&P , NPO status , Patient's Chart, lab work & pertinent test results  History of Anesthesia Complications Negative for: history of anesthetic complications  Airway Mallampati: III  TM Distance: >3 FB Neck ROM: limited    Dental  (+) Chipped, Poor Dentition, Missing   Pulmonary shortness of breath and with exertion,    Pulmonary exam normal        Cardiovascular Exercise Tolerance: Good hypertension, +CHF  Normal cardiovascular exam+ dysrhythmias      Neuro/Psych negative neurological ROS  negative psych ROS   GI/Hepatic negative GI ROS, Neg liver ROS,   Endo/Other  negative endocrine ROS  Renal/GU negative Renal ROS  negative genitourinary   Musculoskeletal   Abdominal   Peds  Hematology negative hematology ROS (+)   Anesthesia Other Findings Past Medical History: No date: Hx of hysterectomy No date: Hyperlipidemia No date: Hypertension No date: Thyroid disease     Comment:  rt thryroidectomy  09/25/2010  Past Surgical History: 04/21/2020: BREAST BIOPSY; Right     Comment:  hydromark 3 [place] bx path pending 08/01/2020: LYMPH NODE BIOPSY; Left     Comment:  Procedure: LYMPH NODE BIOPSY;  Surgeon: Robert Bellow, MD;  Location: [facility] ORS;  Service: General;                Laterality: Left;     Reproductive/Obstetrics negative OB ROS                             Anesthesia Physical Anesthesia Plan  ASA: III  Anesthesia Plan: General   Post-op Pain Management:    Induction: Intravenous  PONV Risk Score and Plan: Propofol infusion and TIVA  Airway Management Planned: Natural Airway and Nasal Cannula  Additional Equipment:   Intra-op Plan:   Post-operative Plan:   Informed Consent: I have reviewed the patients History and Physical,  chart, labs and discussed the procedure including the risks, benefits and alternatives for the proposed anesthesia with the patient or authorized representative who has indicated his/her understanding and acceptance.     Dental Advisory Given  Plan Discussed with: Anesthesiologist, CRNA and Surgeon  Anesthesia Plan Comments: (Patient consented for risks of anesthesia including but not limited to:  - adverse reactions to medications - risk of airway placement if required - damage to eyes, teeth, lips or other oral mucosa - nerve damage due to positioning  - sore throat or hoarseness - Damage to heart, brain, nerves, lungs, other parts of body or loss of life  Patient voiced understanding.)        Anesthesia Quick Evaluation

## 2020-08-22 NOTE — ED Notes (Signed)
Patient returned to ED from CT. Patient provided warm blankets.

## 2020-08-22 NOTE — Progress Notes (Signed)
START OFF PATHWAY REGIMEN - Lymphoma and CLL   [id number]:R-CHOP q21 Days:   A cycle is every 21 days:     Prednisone      Rituximab-xxxx      Cyclophosphamide      Doxorubicin      Vincristine   **Always confirm dose/schedule in your pharmacy ordering system**  Patient Characteristics: Follicular Lymphoma, Grades 1, 2, and 3A, First Line, Stage III / IV, Symptomatic or Bulky Disease Disease Type: Follicular Lymphoma, Grade 1, 2, or 3A Disease Type: Not Applicable Disease Type: Not Applicable Line of Therapy: First Line Disease Characteristics: Symptomatic or Bulky Disease Intent of Therapy: Curative Intent, Not Discussed with Patient

## 2020-08-22 NOTE — ED Notes (Signed)
Patient reports port placement today, for cancer treatment starting next week. Patient reports SOB starting 2 weeks ago, and reports recent admission for PNA and fluid retention in the lungs. Patient reports she was discharged recently after several days on oxygen. Patient reports she was discharged on room air. Patient is noted to become more short of breath while speaking, but NADN.

## 2020-08-22 NOTE — H&P (Signed)
Natasha Farrell [id number] 13-Sep-1945     HPI:  Recently diagnosed lymphoma in need of central venous access.    Medications Prior to Admission  Medication Sig Dispense Refill Last Dose  . acetaminophen (TYLENOL) 500 MG tablet Take 500 mg by mouth every 6 (six) hours as needed for moderate pain, fever or headache.   Past Month at Unknown time  . allopurinol (ZYLOPRIM) 300 MG tablet Take 1 tablet (300 mg total) by mouth daily. 30 tablet 2 08/21/2020 at Unknown time  . carvedilol (COREG) 25 MG tablet Take 1 tablet (25 mg total) by mouth 2 (two) times daily. 180 tablet 3 08/21/2020 at Unknown time  . cholecalciferol (VITAMIN D3) 25 MCG (1000 UNIT) tablet Take 2,000 Units by mouth daily.   08/21/2020 at Unknown time  . feeding supplement (ENSURE ENLIVE / ENSURE PLUS) LIQD Take 237 mLs by mouth 2 (two) times daily between meals. 237 mL 12 08/21/2020 at Unknown time  . loratadine (CLARITIN) 10 MG tablet Take 10 mg by mouth daily as needed for allergies.   Past Month at Unknown time  . Multiple Vitamin (MULTIVITAMIN WITH MINERALS) TABS tablet Take 1 tablet by mouth daily.   Past Week at Unknown time  . omeprazole (PRILOSEC) 20 MG capsule Take 1 capsule (20 mg total) by mouth daily. 30 capsule 3 08/21/2020 at Unknown time  . ondansetron (ZOFRAN) 8 MG tablet Take 1 tablet (8 mg total) by mouth every 8 (eight) hours as needed for nausea or vomiting. 20 tablet 3 08/21/2020 at Unknown time  . pravastatin (PRAVACHOL) 40 MG tablet Take 40 mg by mouth daily.   08/21/2020 at Unknown time  . sodium chloride 1 g tablet Take 1 tablet (1 g total) by mouth 2 (two) times daily with a meal. 60 tablet 0 Past Week at Unknown time  . spironolactone (ALDACTONE) 50 MG tablet Take 50 mg by mouth daily.   08/21/2020 at Unknown time  . SYNTHROID 50 MCG tablet Take 1 tablet (50 mcg total) by mouth daily. 30 tablet 3 08/22/2020 at Unknown time   Allergies  Allergen Reactions  . Lisinopril Swelling    angioedema   Past Medical  History:  Diagnosis Date  . Hx of hysterectomy   . Hyperlipidemia   . Hypertension   . Thyroid disease    rt thryroidectomy  09/25/2010   Past Surgical History:  Procedure Laterality Date  . BREAST BIOPSY Right 04/21/2020   hydromark 3 [place] bx path pending  . LYMPH NODE BIOPSY Left 08/01/2020   Procedure: LYMPH NODE BIOPSY;  Surgeon: Robert Bellow, MD;  Location: [facility] ORS;  Service: General;  Laterality: Left;   Social History   Socioeconomic History  . Marital status: Married    Spouse name: Not on file  . Number of children: Not on file  . Years of education: Not on file  . Highest education level: Not on file  Occupational History  . Not on file  Tobacco Use  . Smoking status: Never Smoker  . Smokeless tobacco: Never Used  Vaping Use  . Vaping Use: Never used  Substance and Sexual Activity  . Alcohol use: No  . Drug use: No  . Sexual activity: Not on file  Other Topics Concern  . Not on file  Social History Narrative  . Not on file   Social Determinants of Health   Financial Resource Strain: Not on file  Food Insecurity: Not on file  Transportation Needs: Not on file  Physical  Activity: Not on file  Stress: Not on file  Social Connections: Not on file  Intimate Partner Violence: Not on file   Social History   Social History Narrative  . Not on file     ROS: Negative.     PE: HEENT: Negative. Lungs: Clear. Cardio: RR.   Assessment/Plan:  Proceed with planned left power port placement.    [facility] 08/22/2020

## 2020-08-22 NOTE — Discharge Instructions (Signed)

## 2020-08-23 ENCOUNTER — Observation Stay: Payer: Medicare HMO

## 2020-08-23 DIAGNOSIS — Z79899 Other long term (current) drug therapy: Secondary | ICD-10-CM | POA: Diagnosis not present

## 2020-08-23 DIAGNOSIS — E890 Postprocedural hypothyroidism: Secondary | ICD-10-CM | POA: Diagnosis present

## 2020-08-23 DIAGNOSIS — Z90710 Acquired absence of both cervix and uterus: Secondary | ICD-10-CM | POA: Diagnosis not present

## 2020-08-23 DIAGNOSIS — I5032 Chronic diastolic (congestive) heart failure: Secondary | ICD-10-CM | POA: Diagnosis present

## 2020-08-23 DIAGNOSIS — R590 Localized enlarged lymph nodes: Secondary | ICD-10-CM | POA: Diagnosis present

## 2020-08-23 DIAGNOSIS — I110 Hypertensive heart disease with heart failure: Secondary | ICD-10-CM | POA: Diagnosis present

## 2020-08-23 DIAGNOSIS — Z95828 Presence of other vascular implants and grafts: Secondary | ICD-10-CM

## 2020-08-23 DIAGNOSIS — J90 Pleural effusion, not elsewhere classified: Secondary | ICD-10-CM | POA: Diagnosis present

## 2020-08-23 DIAGNOSIS — Z79890 Hormone replacement therapy (postmenopausal): Secondary | ICD-10-CM | POA: Diagnosis not present

## 2020-08-23 DIAGNOSIS — E871 Hypo-osmolality and hyponatremia: Secondary | ICD-10-CM | POA: Diagnosis present

## 2020-08-23 DIAGNOSIS — C8219 Follicular lymphoma grade II, extranodal and solid organ sites: Secondary | ICD-10-CM | POA: Diagnosis present

## 2020-08-23 DIAGNOSIS — R0600 Dyspnea, unspecified: Secondary | ICD-10-CM

## 2020-08-23 DIAGNOSIS — Z888 Allergy status to other drugs, medicaments and biological substances status: Secondary | ICD-10-CM | POA: Diagnosis not present

## 2020-08-23 DIAGNOSIS — Z20822 Contact with and (suspected) exposure to covid-19: Secondary | ICD-10-CM | POA: Diagnosis present

## 2020-08-23 DIAGNOSIS — E785 Hyperlipidemia, unspecified: Secondary | ICD-10-CM | POA: Diagnosis present

## 2020-08-23 LAB — RESP PANEL BY RT-PCR (FLU A&B, COVID) ARPGX2
Influenza A by PCR: NEGATIVE
Influenza B by PCR: NEGATIVE
SARS Coronavirus 2 by RT PCR: NEGATIVE

## 2020-08-23 LAB — BASIC METABOLIC PANEL
Anion gap: 8 (ref 5–15)
BUN: 14 mg/dL (ref 8–23)
CO2: 27 mmol/L (ref 22–32)
Calcium: 9.1 mg/dL (ref 8.9–10.3)
Chloride: 99 mmol/L (ref 98–111)
Creatinine, Ser: 0.66 mg/dL (ref 0.44–1.00)
GFR, Estimated: >60 mL/min (ref >60)
Glucose, Bld: 106 mg/dL — ABNORMAL HIGH (ref 70–99)
Potassium: 4.2 mmol/L (ref 3.5–5.1)
Sodium: 134 mmol/L — ABNORMAL LOW (ref 135–145)

## 2020-08-23 LAB — APTT: aPTT: 32 seconds (ref 24–36)

## 2020-08-23 LAB — PROTIME-INR
INR: 1.2 (ref 0.8–1.2)
Prothrombin Time: 14.4 seconds (ref 11.4–15.2)

## 2020-08-23 LAB — PROCALCITONIN: Procalcitonin: 0.12 ng/mL

## 2020-08-23 MED ORDER — LORATADINE 10 MG PO TABS: 10.0000 mg | ORAL_TABLET | Freq: Every day | ORAL | Status: DC | PRN

## 2020-08-23 MED ORDER — ADULT MULTIVITAMIN W/MINERALS CH
1.0000 | ORAL_TABLET | Freq: Every day | ORAL | Status: DC
  Administered 2020-08-24: 09:00:00 1 via ORAL
  Filled 2020-08-23: qty 1

## 2020-08-23 MED ORDER — SODIUM CHLORIDE 0.9 % IV SOLN: INTRAVENOUS | Status: DC

## 2020-08-23 MED ORDER — ONDANSETRON HCL 4 MG/2ML IJ SOLN: 4.0000 mg | Freq: Four times a day (QID) | INTRAMUSCULAR | Status: DC | PRN

## 2020-08-23 MED ORDER — LEVOTHYROXINE SODIUM 50 MCG PO TABS
50.0000 ug | ORAL_TABLET | Freq: Every day | ORAL | Status: DC
  Administered 2020-08-24: 07:00:00 50 ug via ORAL
  Filled 2020-08-23: qty 1

## 2020-08-23 MED ORDER — PANTOPRAZOLE SODIUM 40 MG PO TBEC
40.0000 mg | DELAYED_RELEASE_TABLET | Freq: Every day | ORAL | Status: DC
  Administered 2020-08-23 – 2020-08-24 (×2): 40 mg via ORAL
  Filled 2020-08-23 (×2): qty 1

## 2020-08-23 MED ORDER — ONDANSETRON HCL 4 MG PO TABS: 4.0000 mg | ORAL_TABLET | Freq: Four times a day (QID) | ORAL | Status: DC | PRN

## 2020-08-23 MED ORDER — CARVEDILOL 25 MG PO TABS
25.0000 mg | ORAL_TABLET | Freq: Two times a day (BID) | ORAL | Status: DC
  Administered 2020-08-24: 09:00:00 25 mg via ORAL
  Filled 2020-08-23: qty 1

## 2020-08-23 MED ORDER — MORPHINE SULFATE (PF) 2 MG/ML IV SOLN: 2.0000 mg | INTRAVENOUS | Status: DC | PRN

## 2020-08-23 MED ORDER — SPIRONOLACTONE 25 MG PO TABS
50.0000 mg | ORAL_TABLET | Freq: Every day | ORAL | Status: DC
  Administered 2020-08-24: 09:00:00 50 mg via ORAL
  Filled 2020-08-23: qty 2

## 2020-08-23 MED ORDER — PRAVASTATIN SODIUM 20 MG PO TABS
40.0000 mg | ORAL_TABLET | Freq: Every day | ORAL | Status: DC
  Administered 2020-08-23 – 2020-08-24 (×2): 40 mg via ORAL
  Filled 2020-08-23 (×2): qty 2

## 2020-08-23 MED ORDER — ALLOPURINOL 100 MG PO TABS
300.0000 mg | ORAL_TABLET | Freq: Every day | ORAL | Status: DC
  Administered 2020-08-23 – 2020-08-24 (×2): 300 mg via ORAL
  Filled 2020-08-23 (×2): qty 3

## 2020-08-23 MED ORDER — VITAMIN D 25 MCG (1000 UNIT) PO TABS
2000.0000 [IU] | ORAL_TABLET | Freq: Every day | ORAL | Status: DC
  Administered 2020-08-24: 2000 [IU] via ORAL
  Filled 2020-08-23: qty 2

## 2020-08-23 MED ORDER — ENSURE ENLIVE PO LIQD
237.0000 mL | Freq: Two times a day (BID) | ORAL | Status: DC
  Administered 2020-08-24: 09:00:00 237 mL via ORAL

## 2020-08-23 NOTE — Progress Notes (Signed)
Subjective:  Patient admitted this morning, see detailed H&P by Dr Damita Dunnings 75 year old female with a history of hypertension, surgical hypothyroidism, chronic diastolic heart failure, recent diagnosed with grade 2 follicle lymphoma, s/p Port-A-Cath placement yesterday infiltration for chemotherapy, who was recently hospitalized from 08/15/2020 to 08/20/2020 with acute respiratory failure due to sepsis/pneumonia, left pleural effusion completed 5 days of IV antibiotics came to ED with worsening shortness of breath soon after placement of Port-A-Cath.  In the ED CT chest showed no evidence of PE showed large left and moderate right pleural effusion similar to prior. Patient admitted for bilateral pleural effusion, thoracentesis.  Vitals:   08/23/20 0736 08/23/20 1141  BP: (!) 135/57 (!) 133/59  Pulse: 99 (!) 108  Resp: 20 16  Temp: 98.4 F (36.9 C) 98.8 F (37.1 C)  SpO2: 99% 100%      A/P  Bilateral pleural effusion-chronic, patient is on 2 L/min of oxygen.  Thoracentesis has been ordered per IR.  Patient is stable.  Chronic diastolic heart failure-euvolemic at this time.  Continue carvedilol, spironolactone.  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

## 2020-08-23 NOTE — ED Notes (Signed)
Patient updated on inpatient bed assignment. Patient lights dimmed for patient comfort. Belongings placed in bed.

## 2020-08-23 NOTE — ED Notes (Signed)
Patient transported to inpatient unit by Gershon Mussel, RN.

## 2020-08-23 NOTE — H&P (Signed)
History and Physical    Natasha Farrell:[id number] DOB: 05-29-1945 DOA: 08/22/2020  PCP: Natasha Elk, MD   Patient coming from: Home  I have personally briefly reviewed patient's old medical records in [place]  Chief Complaint: Shortness of breath  HPI: Natasha Farrell is a 75 y.o. female with medical history significant for HTN, surgical hypothyroidism, chronic diastolic heart failure, recently diagnosed grade 2 follicular lymphoma, status post Port-A-Cath placement earlier in the day on 08/22/20 in preparation for chemotherapy, recently hospitalized from 3/25-3/30 with acute respiratory failure secondary to sepsis/pneumonia/left pleural effusion, completing 5 days of IV antibiotics, who presents to the emergency room with a complaint of shortness of breath, starting soon after placement of port.  Said she felt fine when she was discharged on 3/30 as well as when she went to have the port placed on 4/1.  She has a dry cough that is persistent from her recent hospitalization for pneumonia.  She denies chest pain.  Denies fever or chills.  Denies nausea, vomiting, abdominal pain, dysuria or change in bowel habits ED course: On arrival, afebrile, BP 119/78, pulse 99, respirations 26 with O2 sat 99% on room air.  Blood work significant for WBC of 10,800, slightly up from 10,203 days prior, hemoglobin of 9 which is stable from her recent discharge, procalcitonin 0.16, BNP 86, troponin negative x2 at 3/4.  Chemistries unremarkable except for mild hyponatremia of 131. EKG personally reviewed and interpreted: Sinus tachycardia at 101 with no acute ST-T wave changes Imaging: CTA chest: No evidence of PE, large left and moderate right pleural effusions similar in size to prior but widespread bulky adenopathy, splenomegaly and trace upper abdominal ascites, recently placed Port-A-Cath with tip terminating at the left brachiocephalic caval confluence.  No pneumothorax or other acute  complication.  Hospitalist consulted for admission for hobs admit and consideration of repeat thoracentesis  Review of Systems: As per HPI otherwise all other systems on review of systems negative.    Past Medical History:  Diagnosis Date  . Hx of hysterectomy   . Hyperlipidemia   . Hypertension   . Thyroid disease    rt thryroidectomy  09/25/2010    Past Surgical History:  Procedure Laterality Date  . BREAST BIOPSY Right 04/21/2020   hydromark 3 [place] bx path pending  . LYMPH NODE BIOPSY Left 08/01/2020   Procedure: LYMPH NODE BIOPSY;  Surgeon: Natasha Bellow, MD;  Location: [facility] ORS;  Service: General;  Laterality: Left;     reports that she has never smoked. She has never used smokeless tobacco. She reports that she does not drink alcohol and does not use drugs.  Allergies  Allergen Reactions  . Lisinopril Swelling    angioedema    Family History  Problem Relation Age of Onset  . Breast cancer Sister 82  . Colon cancer Sister   . Kidney cancer Sister       Prior to Admission medications   Medication Sig Start Date End Date Taking? Authorizing Provider  acetaminophen (TYLENOL) 500 MG tablet Take 500 mg by mouth every 6 (six) hours as needed for moderate pain, fever or headache.    [provider]  allopurinol (ZYLOPRIM) 300 MG tablet Take 1 tablet (300 mg total) by mouth daily. 07/28/20   Lequita Asal, MD  carvedilol (COREG) 25 MG tablet Take 1 tablet (25 mg total) by mouth 2 (two) times daily. 08/06/20 08/01/21  Furth, Cadence H, PA-C  cholecalciferol (VITAMIN D3) 25 MCG (  1000 UNIT) tablet Take 2,000 Units by mouth daily.    [provider]  feeding supplement (ENSURE ENLIVE / ENSURE PLUS) LIQD Take 237 mLs by mouth 2 (two) times daily between meals. 08/20/20   Natasha Bi, MD  loratadine (CLARITIN) 10 MG tablet Take 10 mg by mouth daily as needed for allergies.    [provider]  Multiple Vitamin (MULTIVITAMIN WITH MINERALS) TABS tablet  Take 1 tablet by mouth daily. 08/21/20   Natasha Bi, MD  omeprazole (PRILOSEC) 20 MG capsule Take 1 capsule (20 mg total) by mouth daily. 08/21/20   Lequita Asal, MD  ondansetron (ZOFRAN) 8 MG tablet Take 1 tablet (8 mg total) by mouth every 8 (eight) hours as needed for nausea or vomiting. 08/21/20   Lequita Asal, MD  pravastatin (PRAVACHOL) 40 MG tablet Take 40 mg by mouth daily.    [provider]  sodium chloride 1 g tablet Take 1 tablet (1 g total) by mouth 2 (two) times daily with a meal. 08/20/20 09/19/20  Natasha Bi, MD  spironolactone (ALDACTONE) 50 MG tablet Take 50 mg by mouth daily. 07/25/20   [provider]  SYNTHROID 50 MCG tablet Take 1 tablet (50 mcg total) by mouth daily. 12/09/10 12/09/11  Natasha Gemma, MD    Physical Exam: Vitals:   08/22/20 1857 08/22/20 2208 08/22/20 2215 08/22/20 2249  BP:    118/71  Pulse:  99 98 99  Resp:  (!) 23 (!) 22 20  Temp:      TempSrc:      SpO2:  96% 96% 97%  Weight: 75 kg     Height: 5\' 4"  (1.626 m)        Vitals:   08/22/20 1857 08/22/20 2208 08/22/20 2215 08/22/20 2249  BP:    118/71  Pulse:  99 98 99  Resp:  (!) 23 (!) 22 20  Temp:      TempSrc:      SpO2:  96% 96% 97%  Weight: 75 kg     Height: 5\' 4"  (1.626 m)         Constitutional: Alert and oriented x 3 . Not in any apparent distress HEENT:      Head: Normocephalic and atraumatic.         Eyes: PERLA, EOMI, Conjunctivae are normal. Sclera is non-icteric.       Mouth/Throat: Mucous membranes are moist.       Neck: Supple with no signs of meningismus. Cardiovascular: Regular rate and rhythm. No murmurs, gallops, or rubs. 2+ symmetrical distal pulses are present . No JVD. No LE edema Respiratory:  Mildly tachypneic.Lungs sounds diminished bilaterally. No wheezes, crackles, or rhonchi.  Gastrointestinal: Soft, non tender, mild distention with positive bowel sounds.  Genitourinary: No CVA tenderness. Musculoskeletal: Nontender with normal range  of motion in all extremities. No cyanosis, or erythema of extremities. Neurologic:  Face is symmetric. Moving all extremities. No gross focal neurologic deficits . Skin: Skin is warm, dry.  No rash or ulcers Psychiatric: Mood and affect are normal    Labs on Admission: I have personally reviewed following labs and imaging studies  CBC: Recent Labs  Lab 08/17/20 0812 08/18/20 0818 08/19/20 0530 08/20/20 0429 08/22/20 1911  WBC 13.4* 12.8* 11.8* 10.2 10.8*  NEUTROABS  --   --   --   --  5.6  HGB 10.0* 10.1* 9.1* 8.9* 9.0*  HCT 28.4* 30.0* 27.4* 26.7* 27.6*  MCV 85.8 88.5 90.4 92.1 93.2  PLT  51* 62* 64* 65* 78*   Basic Metabolic Panel: Recent Labs  Lab 08/17/20 0812 08/18/20 0818 08/19/20 0530 08/20/20 0429 08/22/20 1911  NA 126* 130* 134* 135 131*  K 3.8 4.2 3.8 3.8 4.2  CL 92* 94* 98 98 96*  CO2 24 25 27 27 24   GLUCOSE 119* 115* 117* 124* 123*  BUN 16 16 18 18 18   CREATININE 0.71 0.80 0.82 0.87 0.67  CALCIUM 8.5* 9.2 9.3 9.2 9.3   GFR: Estimated Creatinine Clearance: 63 mL/min (by C-G formula based on SCr of 0.67 mg/dL). Liver Function Tests: Recent Labs  Lab 08/22/20 1911  AST 29  ALT 16  ALKPHOS 79  BILITOT 1.2  PROT 7.1  ALBUMIN 3.8   No results for input(s): LIPASE, AMYLASE in the last 168 hours. No results for input(s): AMMONIA in the last 168 hours. Coagulation Profile: No results for input(s): INR, PROTIME in the last 168 hours. Cardiac Enzymes: No results for input(s): CKTOTAL, CKMB, CKMBINDEX, TROPONINI in the last 168 hours. BNP (last 3 results) No results for input(s): PROBNP in the last 8760 hours. HbA1C: No results for input(s): HGBA1C in the last 72 hours. CBG: No results for input(s): GLUCAP in the last 168 hours. Lipid Profile: No results for input(s): CHOL, HDL, LDLCALC, TRIG, CHOLHDL, LDLDIRECT in the last 72 hours. Thyroid Function Tests: No results for input(s): TSH, T4TOTAL, FREET4, T3FREE, THYROIDAB in the last 72 hours. Anemia  Panel: Recent Labs    08/20/20 0429  VITAMINB12 206  FOLATE 5.1*  FERRITIN 271  TIBC 330  IRON 81  RETICCTPCT 5.9*   Urine analysis:    Component Value Date/Time   COLORURINE YELLOW (A) 07/21/2020 1124   APPEARANCEUR HAZY (A) 07/21/2020 1124   LABSPEC 1.017 07/21/2020 1124   PHURINE 5.0 07/21/2020 1124   GLUCOSEU NEGATIVE 07/21/2020 1124   HGBUR NEGATIVE 07/21/2020 1124   BILIRUBINUR NEGATIVE 07/21/2020 1124   KETONESUR NEGATIVE 07/21/2020 1124   PROTEINUR 100 (A) 07/21/2020 1124   UROBILINOGEN 0.2 09/21/2010 1140   NITRITE NEGATIVE 07/21/2020 1124   LEUKOCYTESUR NEGATIVE 07/21/2020 1124    Radiological Exams on Admission: DG Chest 2 View  Result Date: 08/22/2020 CLINICAL DATA:  Shortness of breath EXAM: CHEST - 2 VIEW COMPARISON:  08/22/2020 FINDINGS: Left Port-A-Cath remains in place, unchanged. Moderate left pleural effusion. Left basilar opacity, likely atelectasis. No pneumothorax. Small right pleural effusion with right base atelectasis. Heart is normal size. IMPRESSION: Left Port-A-Cath in place with the tip in the SVC.  Unchanged. Moderate left pleural effusion and small right pleural effusion. Bibasilar atelectasis, left greater than right. Electronically Signed   By: Rolm Baptise M.D.   On: 08/22/2020 19:25   CT Angio Chest PE W and/or Wo Contrast  Result Date: 08/22/2020 CLINICAL DATA:  Shortness of breath, port placement, recent diagnosis of lymphoma. EXAM: CT ANGIOGRAPHY CHEST WITH CONTRAST TECHNIQUE: Multidetector CT imaging of the chest was performed using the standard protocol during bolus administration of intravenous contrast. Multiplanar CT image reconstructions and MIPs were obtained to evaluate the vascular anatomy. CONTRAST:  30mL OMNIPAQUE IOHEXOL 350 MG/ML SOLN COMPARISON:  Head CT 08/12/2020, CT a chest 07/21/2020 FINDINGS: Cardiovascular: Satisfactory opacification the pulmonary arteries to the segmental level. No pulmonary artery filling defects are  identified. Central pulmonary arteries are normal caliber. Normal heart size. Trace pericardial fluid is likely within physiologic normal. The aortic root is suboptimally assessed given cardiac pulsation artifact. Atherosclerotic plaque within the normal caliber aorta. No acute luminal abnormality of the imaged  aorta. No periaortic stranding or hemorrhage. Normal 3 vessel branching of the aortic arch. Proximal great vessels are unremarkable. Postsurgical changes from recent placement of a tunneled left subclavian approach Port-A-Cath with the tip terminating at the left brachiocephalic-caval confluence. No other major venous abnormalities are seen. Mediastinum/Nodes: Bulky mediastinal, hilar and internal mammary and axillary adenopathy is again seen. Enlarged index right internal mammary node now measuring up to 2.7 cm, previously 2 cm (4/27). A stable 15 mm node is seen at the level of the thoracic inlet between left common carotid and subclavian artery (4/16). There is an low anterior mediastinal lymph node measuring 14 mm short axis, 10 mm previously (4/75). Postsurgical changes of the right thyroid gland. No acute abnormality of the 6 thoracic esophagus. The central trachea is patent and free of acute abnormality. Some mild airways narrowing in the medial right lower lobe and lingula and left lower lobe likely related to the atelectatic changes. Lungs/Pleura: Large left and moderate right pleural effusions, similar in size to most recent comparison PET-CT. Adjacent areas of fairly uniformly enhancing atelectatic lung underlying airspace disease difficult to fully exclude. No pneumothorax. No concerning pulmonary nodules or masses within the aerated portions of the lungs. Upper Abdomen: Trace upper abdominal ascites is visible. Splenomegaly is seen. Musculoskeletal: Numerous sclerotic foci throughout the spine and ribs with demonstrable FDG avidity on comparison PET-CT. No pathologic fracture is seen. No other  acute or conspicuous osseous abnormalities. Axillary, supraclavicular and internal mammary adenopathy as above. Review of the MIP images confirms the above findings. IMPRESSION: 1. No evidence of pulmonary embolism. 2. Large left and moderate right pleural effusions, similar in size to most recent comparison PET-CT. Adjacent areas of fairly uniformly enhancing dependent and passively atelectatic lung underlying airspace disease difficult to fully exclude. 3. Widespread bulky adenopathy much of which is stable to increasing in size from most recent comparison PET-CT concerning for progression of disease. 4. Numerous sclerotic foci throughout the spine and ribs with demonstrable FDG avidity on comparison PET-CT, consistent with known osseous metastatic involvement 5. Splenomegaly. 6. Trace upper abdominal ascites. 7. Postsurgical changes from recent placement of a tunneled left subclavian approach Port-A-Cath with the tip terminating at the left brachiocephalic-caval confluence. No pneumothorax or other acute complication. 8. Aortic Atherosclerosis (ICD10-I70.0). Electronically Signed   By: Lovena Le M.D.   On: 08/22/2020 23:22   DG Chest Port 1 View  Result Date: 08/22/2020 CLINICAL DATA:  Lymphoma, port catheter replacement EXAM: PORTABLE CHEST 1 VIEW COMPARISON:  08/20/2020, 08/22/2020 FINDINGS: Left subclavian power port catheter tip has retracted from the intraoperative comparison found to the innominate/proximal SVC level. Negative for pneumothorax. Trachea midline. Stable heart size and vascularity. Moderate left effusion with associated partial left lower lung collapse/consolidation. Very small right effusion. No acute or abnormal osseous finding. IMPRESSION: Left subclavian power port catheter tip has retracted to the innominate SVC junction level. Pleural effusions, larger on the left No pneumothorax Electronically Signed   By: Jerilynn Mages.  Shick M.D.   On: 08/22/2020 09:55   DG C-Arm 1-60 Min-No  Report  Result Date: 08/22/2020 Fluoroscopy was utilized by the requesting physician.  No radiographic interpretation.     Assessment/Plan 75 year old female with history of HTN, surgical hypothyroidism, chronic diastolic heart failure, recently diagnosed grade 2 follicular lymphoma, status post Port-A-Cath placement earlier in the day on 08/22/20 in preparation for chemotherapy,hospitalized from 3/25-3/30 with acute respiratory failure secondary to sepsis/pneumonia/left pleural effusion, completing 5 days of IV antibiotics, presenting with shortness of breath, starting  soon after placement of port.      Acute dyspnea   Bilateral pleural effusion   Recent pneumonia   s/p Port-A-Cath placement 08/22/20 -Etiology suspect related to bilateral pleural effusion and underlying bulky adenopathy on chest CT related to her lymphoma, in combination with recent pneumonia -No evidence of PE, recurrent pneumonia or Port-A-Cath complication such as pneumothorax or other acute finding on CTA chest -Low suspicion for ACS or CHF exacerbation given negative troponins and normal BNP -IR consult for therapeutic thoracentesis -Supplemental oxygen if needed to keep sats over 93%    Chronic diastolic CHF (congestive heart failure) ([place]) -Patient with stable bilateral pleural effusions and BNP normal at 86.4 -Overall appears euvolemic -Continue carvedilol and spironolactone -Daily weights    Follicular lymphoma grade II of extranodal and solid organ sites  -Followed by Dr. Mike Gip, last seen 4/1 -Consider oncology consult in the a.m.    Essential hypertension -Continue home meds of carvedilol  Surgical hypothyroidism -Continue levothyroxine    DVT prophylaxis: Lovenox  Code Status: full code  Family Communication:  none  Disposition Plan: Back to previous home environment Consults called: IR for thoracentesis Status: Observation    Athena Masse MD [facility]     08/23/2020, 12:11 AM

## 2020-08-23 NOTE — Procedures (Signed)
PROCEDURE SUMMARY:  Successful US guided left thoracentesis. Yielded 1.5 L of blody fluid. Pt tolerated procedure well. No immediate complications.  CXR ordered; no post-procedure pneumothorax identified  EBL < 2 mL  Theresa Duty, NP 08/23/2020 2:09 PM

## 2020-08-24 DIAGNOSIS — J90 Pleural effusion, not elsewhere classified: Secondary | ICD-10-CM | POA: Diagnosis not present

## 2020-08-24 LAB — PROCALCITONIN: Procalcitonin: 0.13 ng/mL

## 2020-08-24 NOTE — Discharge Summary (Addendum)
Physician Discharge Summary  Natasha Farrell JKK:[id number] DOB: [birth year] DOA: 08/22/2020  PCP: [name], MD  Admit date: 08/22/2020 Discharge date: 08/24/2020  Time spent: 50 minutes  Recommendations for Outpatient Follow-up:  1. 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 [facility])   s/p Port-A-Cath placement 08/22/20   Pleural effusion   Discharge Condition: Stable  Diet recommendation: Heart healthy diet  Filed Weights   08/22/20 1857  Weight: 75 kg    History of present illness:  75 year old female with a history of hypertension, surgical hypothyroidism, chronic diastolic heart failure, recent diagnosed with grade 2 follicle lymphoma, s/p Port-A-Cath placement yesterday infiltration for chemotherapy, who was recently hospitalized from 08/15/2020 to 08/20/2020 with acute respiratory failure due to sepsis/pneumonia, left pleural effusion completed 5 days of IV antibiotics came to ED with worsening shortness of breath soon after placement of Port-A-Cath.  In the ED CT chest showed no evidence of PE showed large left and moderate right pleural effusion similar to prior. Patient admitted for bilateral pleural effusion, thoracentesis.   Hospital Course:   Bilateral pleural effusion-chronic, patient was requiring 2 L/min of oxygen.  She underwent thoracentesis for left pleural effusion per IR, removing 1.5 L of bloody fluid.  Patient feels better, she is no longer requiring oxygen.  Chest x-ray shows resolution of pleural effusion.   Chronic diastolic heart failure-euvolemic at this time.  Continue carvedilol, spironolactone.  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-followed by  Dr.Corcoran  Hypertension-continue Coreg  Surgical hypothyroidism-continue levothyroxine   Procedures:  Thoracentesis  Consultations:  IR  Discharge Exam: Vitals:   08/24/20 0858 08/24/20 0915  BP: (!) 143/69 (!) 143/69  Pulse: (!) 117 98  Resp: 16 17  Temp: 99.3 F (37.4 C) 99 F (37.2 C)  SpO2: 99%     General: Appears in no acute distress Cardiovascular: S1-S2, regular Respiratory: Clear to auscultation bilaterally  Discharge Instructions   Discharge Instructions    Diet - low sodium heart healthy   Complete by: As directed    Increase activity slowly   Complete by: As directed      Allergies as of 08/24/2020      Reactions   Lisinopril Swelling   angioedema      Medication List    TAKE these medications   acetaminophen 500 MG tablet Commonly known as: TYLENOL Take 500 mg by mouth every 6 (six) hours as needed for moderate pain, fever or headache.   allopurinol 300 MG tablet Commonly known as: ZYLOPRIM Take 1 tablet (300 mg total) by mouth daily.   carvedilol 25 MG tablet Commonly known as: COREG Take 1 tablet (25 mg total) by mouth 2 (two) times daily.   cholecalciferol 25 MCG (1000 UNIT) tablet Commonly known as: VITAMIN D3 Take 2,000 Units by mouth daily.   feeding supplement Liqd Take 237 mLs by mouth 2 (two) times daily between meals.   loratadine 10 MG tablet Commonly known as: CLARITIN Take 10 mg by mouth daily as needed for allergies.   multivitamin with minerals Tabs tablet Take 1 tablet by mouth daily.   omeprazole 20 MG capsule Commonly known as: PRILOSEC Take 1 capsule (20 mg total) by mouth daily.   ondansetron 8 MG tablet Commonly known as: Zofran  Take 1 tablet (8 mg total) by mouth every 8 (eight) hours as needed for nausea or vomiting.   pravastatin 40 MG tablet Commonly known as: PRAVACHOL Take 40 mg by mouth daily.   sodium chloride 1 g tablet Take 1 tablet (1 g total) by mouth 2 (two) times daily with a  meal.   spironolactone 50 MG tablet Commonly known as: ALDACTONE Take 50 mg by mouth daily.   Synthroid 50 MCG tablet Generic drug: levothyroxine Take 1 tablet (50 mcg total) by mouth daily.      Allergies  Allergen Reactions  . Lisinopril Swelling    angioedema      The results of significant diagnostics from this hospitalization (including imaging, microbiology, ancillary and laboratory) are listed below for reference.    Significant Diagnostic Studies: DG Chest 1 View  Result Date: 08/17/2020 CLINICAL DATA:  Acute respiratory failure EXAM: CHEST  1 VIEW COMPARISON:  08/15/2020 FINDINGS: The cardiomediastinal silhouette is unchanged. Airspace disease/consolidation within the LEFT mid-lower lung is slightly improved. Trace bilateral pleural effusions are now noted. New pulmonary vascular congestion is present. There is no evidence of pneumothorax. IMPRESSION: Slightly improved LEFT mid-lower lung airspace disease/consolidation with new pulmonary vascular congestion and trace bilateral pleural effusions. Electronically Signed   By: Margarette Canada M.D.   On: 08/17/2020 11:19   DG Chest 2 View  Result Date: 08/22/2020 CLINICAL DATA:  Shortness of breath EXAM: CHEST - 2 VIEW COMPARISON:  08/22/2020 FINDINGS: Left Port-A-Cath remains in place, unchanged. Moderate left pleural effusion. Left basilar opacity, likely atelectasis. No pneumothorax. Small right pleural effusion with right base atelectasis. Heart is normal size. IMPRESSION: Left Port-A-Cath in place with the tip in the SVC.  Unchanged. Moderate left pleural effusion and small right pleural effusion. Bibasilar atelectasis, left greater than right. Electronically Signed   By: Rolm Baptise M.D.   On: 08/22/2020 19:25   DG Chest 2 View  Result Date: 08/20/2020 CLINICAL DATA:  Respiratory failure.  Prior left thoracentesis. EXAM: CHEST - 2 VIEW COMPARISON:  Chest x-ray 08/17/2020.  PET-CT 08/12/2020. FINDINGS: Mediastinum and hilar  structures are stable. Heart size stable. Interim near complete resolution of left mid lung infiltrate. Small residual left pleural effusion. Tiny right pleural effusion again noted. Questionable lucency noted over the left base on PA view and anterior chest on lateral view. To completely exclude a pneumothorax on the left a right-side-down decubitus chest x-ray is suggested. No acute bony abnormality. Surgical clips right neck. IMPRESSION: 1. Interim near complete resolution of left mid lung infiltrate. Mild residual left pleural effusion. Tiny right pleural effusion also again noted. 2. Questionable lucency noted over the left base on PA view and anterior chest on lateral view. To completely exclude a pneumothorax on the left a right-side-down decubitus chest x-ray suggested. These results will be called to the ordering clinician or representative by the Radiologist Assistant, and communication documented in the PACS or [employer]. Electronically Signed   By: Marcello Moores  Register   On: 08/20/2020 11:37   CT Angio Chest PE W and/or Wo Contrast  Result Date: 08/22/2020 CLINICAL DATA:  Shortness of breath, port placement, recent diagnosis of lymphoma. EXAM: CT ANGIOGRAPHY CHEST WITH CONTRAST TECHNIQUE: Multidetector CT imaging of the chest was performed using the standard protocol during bolus administration of intravenous contrast. Multiplanar CT image reconstructions and MIPs were obtained to evaluate the vascular anatomy. CONTRAST:  38m OMNIPAQUE IOHEXOL 350 MG/ML SOLN COMPARISON:  Head CT 08/12/2020, CT a chest 07/21/2020 FINDINGS: Cardiovascular: Satisfactory  opacification the pulmonary arteries to the segmental level. No pulmonary artery filling defects are identified. Central pulmonary arteries are normal caliber. Normal heart size. Trace pericardial fluid is likely within physiologic normal. The aortic root is suboptimally assessed given cardiac pulsation artifact. Atherosclerotic plaque within the  normal caliber aorta. No acute luminal abnormality of the imaged aorta. No periaortic stranding or hemorrhage. Normal 3 vessel branching of the aortic arch. Proximal great vessels are unremarkable. Postsurgical changes from recent placement of a tunneled left subclavian approach Port-A-Cath with the tip terminating at the left brachiocephalic-caval confluence. No other major venous abnormalities are seen. Mediastinum/Nodes: Bulky mediastinal, hilar and internal mammary and axillary adenopathy is again seen. Enlarged index right internal mammary node now measuring up to 2.7 cm, previously 2 cm (4/27). A stable 15 mm node is seen at the level of the thoracic inlet between left common carotid and subclavian artery (4/16). There is an low anterior mediastinal lymph node measuring 14 mm short axis, 10 mm previously (4/75). Postsurgical changes of the right thyroid gland. No acute abnormality of the 6 thoracic esophagus. The central trachea is patent and free of acute abnormality. Some mild airways narrowing in the medial right lower lobe and lingula and left lower lobe likely related to the atelectatic changes. Lungs/Pleura: Large left and moderate right pleural effusions, similar in size to most recent comparison PET-CT. Adjacent areas of fairly uniformly enhancing atelectatic lung underlying airspace disease difficult to fully exclude. No pneumothorax. No concerning pulmonary nodules or masses within the aerated portions of the lungs. Upper Abdomen: Trace upper abdominal ascites is visible. Splenomegaly is seen. Musculoskeletal: Numerous sclerotic foci throughout the spine and ribs with demonstrable FDG avidity on comparison PET-CT. No pathologic fracture is seen. No other acute or conspicuous osseous abnormalities. Axillary, supraclavicular and internal mammary adenopathy as above. Review of the MIP images confirms the above findings. IMPRESSION: 1. No evidence of pulmonary embolism. 2. Large left and moderate right  pleural effusions, similar in size to most recent comparison PET-CT. Adjacent areas of fairly uniformly enhancing dependent and passively atelectatic lung underlying airspace disease difficult to fully exclude. 3. Widespread bulky adenopathy much of which is stable to increasing in size from most recent comparison PET-CT concerning for progression of disease. 4. Numerous sclerotic foci throughout the spine and ribs with demonstrable FDG avidity on comparison PET-CT, consistent with known osseous metastatic involvement 5. Splenomegaly. 6. Trace upper abdominal ascites. 7. Postsurgical changes from recent placement of a tunneled left subclavian approach Port-A-Cath with the tip terminating at the left brachiocephalic-caval confluence. No pneumothorax or other acute complication. 8. Aortic Atherosclerosis (ICD10-I70.0). Electronically Signed   By: Lovena Le M.D.   On: 08/22/2020 23:22   DG Chest Right Decubitus  Result Date: 08/20/2020 CLINICAL DATA:  Pleural effusion with questionable left-sided pneumothorax EXAM: CHEST - RIGHT DECUBITUS COMPARISON:  PA and lateral chest August 20, 2020 FINDINGS: There is a free-flowing pleural effusion on the right. There is pleural effusion on the left as well. Atelectatic change noted in the left base. There is no appreciable pneumothorax evident on decubitus examination. IMPRESSION: Pleural effusions bilaterally. Left base atelectasis. No pneumothorax is discernible on decubitus radiograph. Electronically Signed   By: Lowella Grip III M.D.   On: 08/20/2020 14:59   NM PET Image Initial (PI) Skull Base To Thigh  Result Date: 08/12/2020 CLINICAL DATA:  Initial treatment strategy for adenopathy, 75 year old female with suspected lymphoma. EXAM: NUCLEAR MEDICINE PET SKULL BASE TO THIGH TECHNIQUE: 9.19 mCi F-18 FDG was  injected intravenously. Full-ring PET imaging was performed from the skull base to thigh after the radiotracer. CT data was obtained and used for  attenuation correction and anatomic localization. Fasting blood glucose: 109 mg/dl COMPARISON:  CT angiography of the chest from February 2022. FINDINGS: Mediastinal blood pool activity: SUV max 1.22 Liver activity: SUV max 4.67 (liver is markedly heterogeneous and appears to be involved in the LEFT lobe. SUV measured in the RIGHT lobe but way from areas of marked increased metabolic activity. NECK: Multiple enlarged lymph nodes in the neck with hypermetabolic features. Lymphoid tissue with a maximum SUV of 9.6 in Waldeyer's ring. Lingual tonsils also with increased metabolic activity. LEFT neck adenopathy, (image 37, series 4) measuring approximately 2.6 by 3.6 cm with a maximum SUV of 14.1. RIGHT neck adenopathy with similar metabolic activity measuring approximately 2.7 cm with respect to conglomerate at level II. LEFT thoracic inlet lymphadenopathy (image 60, series 4) approximately 13 mm short axis with a maximum SUV of 11. Similar activity in RIGHT neck lymph nodes. Smaller posterior triangle lymph nodes, for instance on image 43 of series 4 displaying maximum SUV of 7.5 which short axis dimension 10 1 cm. Incidental CT findings: Bulky adenopathy bilaterally. Airways patent. CHEST: Superior mediastinal lymph node (image 70, series 4) 16 mm short axis with a maximum SUV of 12. Bulky bilateral axillary adenopathy. (Image 69 of series 4 1.8 cm short axis bulky RIGHT axillary lymph node with a maximum SUV of 12.3 Subpectoral and RIGHT thoracic inlet adenopathy with similar metabolic activity though smaller size. Internal mammary adenopathy also with increased metabolic activity along with LEFT axillary adenopathy. Bilateral hilar lymph nodes with increased metabolic activity. Soft tissue along the paraspinous regions bilaterally, for instance on image 86 of series 4 measuring 11 mm short axis with a maximum SUV of 8.7 Bilateral paraspinal uptake tracking towards the lower chest with similar appearance. Incidental  CT findings: Calcified atheromatous plaque of the thoracic aorta. No aneurysmal dilation. Large LEFT-sided effusion. Small RIGHT-sided effusion. Large effusion peer Jeannine Kitten more than 3/4 of the chest. Basilar collapse bilaterally worse on the LEFT. ABDOMEN/PELVIS: Diffuse LEFT hepatic involvement involving the entire lateral and medial segment of the LEFT hepatic lobe with a maximum SUV of 9.5. Other areas of scattered increased metabolic activity in addition to confluent metabolic activity that extends into the RIGHT hepatic lobe, hepatic subsegment V. Scattered areas in hepatic subsegment VII, VI and VIII. Splenomegaly with a maximum SUV, diffuse intense activity of 11.5, maximum craniocaudal dimension approximately 16 cm. Periportal lymphadenopathy and bulky retroperitoneal and pelvic adenopathy with increased metabolic activity. Rim of soft tissue encases the aorta measuring approximately 2.2 cm greatest thickness on image 166 of series 4 with a maximum SUV of 14.2. Similar metabolic activity and retrocrural, periportal and lower retroperitoneal lymph nodes. An example of pelvic adenopathy is a large node in the RIGHT groin measuring 2.2 cm with a maximum SUV of 15. Bilateral pelvic and inguinal adenopathy with similar metabolic activity though smaller than this dominant lymph node ranging between 1 and 2 cm in short axis. Ileal uptake which is segmental but intense at 7.4 and confined to the terminal ileum. Incidental CT findings: Hypoattenuation in the involved areas of the liver. Splenomegaly as described. Pancreas unremarkable by CT though with limited assessment due to surrounding adenopathy. Adrenal glands and kidneys without acute process. Nephrolithiasis with 2 3-4 mm calculi in the LEFT kidney. No hydronephrosis. Urinary bladder is under distended. No sign of bowel obstruction. Mesenteric edema and  small volume ascites of uncertain significance in this 75 year old patient but potentially due to lymphatic  congestion in the setting of extensive adenopathy. Ileal thickening and some borderline enlarged nodes in the ileocolic mesentery. SKELETON: Diffuse patchy areas of increased metabolic activity throughout the axial and appendicular skeleton involving all visualized bones with intense metabolic activity as an example in the RIGHT proximal humerus measuring 12.54 maximum SUV and diffuse metabolic activity in the pelvis, LEFT ischium at 11.2 for maximum SUV. Similar uptake in multiple areas throughout the spine, ribs, scapulae, bilateral femora and bilateral humeri nearly confluent in many areas all levels of the thoracolumbar spine are affected less involvement in the cervical spine. Incidental CT findings: Areas of bony sclerosis within the thoracic spine. IMPRESSION: Widespread adenopathy, visceral involvement and signs of near diffuse, multifocal bony involvement, distribution most compatible with lymphoma, Deauville category 5. Correlation with pathology results is suggested if not yet performed. Ileal involvement as well. Peritoneal involvement is suggested as well with nodular changes about the peritoneum but without significant FDG uptake. Example seen on image 179 of series 4. Paraspinal soft tissue without frank bony destruction. Scattered areas of sclerosis underestimate the degree of bony involvement. Abdominal ascites may be related to anasarca and or lymphatic congestion with marked enlargement of LEFT-sided pleural effusion and mild enlargement of small RIGHT-sided pleural fluid. These results will be called to the ordering clinician or representative by the Radiologist Assistant, and communication documented in the PACS or [employer]. Electronically Signed   By: Zetta Bills M.D.   On: 08/12/2020 16:30   DG Chest Port 1 View  Result Date: 08/23/2020 CLINICAL DATA:  Status post thoracentesis. EXAM: PORTABLE CHEST 1 VIEW COMPARISON:  CT pulmonary angiogram August 22, 2020 FINDINGS: The patient's  left-sided pleural effusion is smaller in the interval after thoracentesis. No left-sided pneumothorax identified. A small right pleural effusion remains. Linear opacity in the left mid lung is likely atelectasis. Recommend attention on follow-up. Mild atelectasis associated with the right effusion. The left Port-A-Cath is stable. No other acute abnormalities. IMPRESSION: 1. The left pleural effusion is smaller in the interval after thoracentesis with no pneumothorax. 2. Platelike opacity in left mid lung is likely atelectasis. Recommend attention on follow-up. 3. Small bilateral effusions remain. Electronically Signed   By: Dorise Bullion III M.D   On: 08/23/2020 14:05   DG Chest Port 1 View  Result Date: 08/22/2020 CLINICAL DATA:  Lymphoma, port catheter replacement EXAM: PORTABLE CHEST 1 VIEW COMPARISON:  08/20/2020, 08/22/2020 FINDINGS: Left subclavian power port catheter tip has retracted from the intraoperative comparison found to the innominate/proximal SVC level. Negative for pneumothorax. Trachea midline. Stable heart size and vascularity. Moderate left effusion with associated partial left lower lung collapse/consolidation. Very small right effusion. No acute or abnormal osseous finding. IMPRESSION: Left subclavian power port catheter tip has retracted to the innominate SVC junction level. Pleural effusions, larger on the left No pneumothorax Electronically Signed   By: Jerilynn Mages.  Shick M.D.   On: 08/22/2020 09:55   DG Chest Portable 1 View  Result Date: 08/15/2020 CLINICAL DATA:  Shortness of breath, thoracentesis earlier today, worsening since discharge by report. EXAM: PORTABLE CHEST 1 VIEW COMPARISON:  August 15, 2020 10:47 a.m. FINDINGS: Since the previous, very recent imaging study there is been interval development of confluent alveolar airspace disease in the LEFT lower lobe. The LEFT hemidiaphragm remains obscured, LEFT heart border now obscured when compared to the prior study. Blunting of LEFT  costodiaphragmatic sulcus compatible with residual  effusion is similar to the prior study. Blunting of RIGHT costodiaphragmatic sulcus is unchanged, subtle RIGHT lower lobe airspace disease. Trachea is midline. Cardiomediastinal contours are stable grossly though limited assessment due to the presence of airspace disease in the LEFT chest. No sign of pneumothorax. On limited assessment no acute skeletal process. EKG leads project over the chest. IMPRESSION: 1. Interval development of confluent alveolar airspace disease in the LEFT lower lobe concerning for re-expansion pulmonary edema versus pneumonia or aspiration. 2. No visible pneumothorax. 3. Small residual LEFT-sided effusion and small RIGHT-sided effusion. 4. Subtle opacity at the RIGHT lung base may represent atelectasis or developing pneumonia. Electronically Signed   By: Zetta Bills M.D.   On: 08/15/2020 14:27   DG Chest Port 1 View  Result Date: 08/15/2020 CLINICAL DATA:  Status post left thoracentesis EXAM: PORTABLE CHEST 1 VIEW COMPARISON:  08/01/2020 FINDINGS: Cardiomediastinal silhouette and pulmonary vasculature are within normal limits. No pneumothorax status post left thoracentesis. Significant interval reduction of left pleural effusion. Small bilateral pleural effusion still remain. IMPRESSION: No pneumothorax status post left thoracentesis. Electronically Signed   By: Miachel Roux M.D.   On: 08/15/2020 10:55   DG Chest Port 1 View  Result Date: 08/01/2020 CLINICAL DATA:  Low oxygen saturation after biopsy. Postop left axillary lymph node biopsy EXAM: PORTABLE CHEST 1 VIEW COMPARISON:  CT chest 07/21/2020 FINDINGS: Moderate large left pleural effusion has progressed. Progressive left lower lobe consolidation. No pneumothorax Small right effusion and mild right lower lobe atelectasis. Vascularity normal. IMPRESSION: Progressive left pleural effusion and left lower lobe consolidation. Small right effusion and mild right lower lobe  atelectasis. Electronically Signed   By: Franchot Gallo M.D.   On: 08/01/2020 11:39   CT BONE MARROW BIOPSY & ASPIRATION  Result Date: 08/04/2020 INDICATION: Recent diagnosis of lymphoma. Please perform CT-guided bone marrow biopsy for staging purposes. EXAM: CT-GUIDED BONE MARROW BIOPSY AND ASPIRATION MEDICATIONS: None ANESTHESIA/SEDATION: Fentanyl 100 mcg IV; Versed 2 mg IV Sedation Time: 11 Minutes; The patient was continuously monitored during the procedure by the interventional radiology nurse under my direct supervision. COMPLICATIONS: None immediate. PROCEDURE: Informed consent was obtained from the patient following an explanation of the procedure, risks, benefits and alternatives. The patient understands, agrees and consents for the procedure. All questions were addressed. A time out was performed prior to the initiation of the procedure. The patient was positioned prone and non-contrast localization CT was performed of the pelvis to demonstrate the iliac marrow spaces. The operative site was prepped and draped in the usual sterile fashion. Under sterile conditions and local anesthesia, a 22 gauge spinal needle was utilized for procedural planning. Next, an 11 gauge coaxial bone biopsy needle was advanced into the left iliac marrow space. Needle position was confirmed with CT imaging. Initially, a bone marrow aspiration was performed. Next, a bone marrow biopsy was obtained with the 11 gauge outer bone marrow device. The 11 gauge coaxial bone biopsy needle was re-advanced into a slightly different location within the left iliac marrow space, positioning was confirmed with CT imaging and an additional bone marrow biopsy was obtained. The needle was removed and superficial hemostasis was obtained with manual compression. A dressing was applied. The patient tolerated the procedure well without immediate post procedural complication. IMPRESSION: Successful CT guided left iliac bone marrow aspiration and  core biopsy. Electronically Signed   By: Sandi Mariscal M.D.   On: 08/04/2020 10:08   DG C-Arm 1-60 Min-No Report  Result Date: 08/22/2020 Fluoroscopy was utilized  by the requesting physician.  No radiographic interpretation.   US THORACENTESIS ASP PLEURAL SPACE W/IMG GUIDE  Result Date: 08/23/2020 INDICATION: Patient with a history of lymphoma and recurrent left pleural effusion. Interventional radiology asked to perform a therapeutic thoracentesis. EXAM: ULTRASOUND GUIDED THORACENTESIS MEDICATIONS: 1% lidocaine 10 mL COMPLICATIONS: None immediate. PROCEDURE: An ultrasound guided thoracentesis was thoroughly discussed with the patient and questions answered. The benefits, risks, alternatives and complications were also discussed. The patient understands and wishes to proceed with the procedure. Written consent was obtained. Ultrasound was performed to localize and mark an adequate pocket of fluid in the left chest. The area was then prepped and draped in the normal sterile fashion. 1% Lidocaine was used for local anesthesia. Under ultrasound guidance a 6 Fr Safe-T-Centesis catheter was introduced. Thoracentesis was performed. The catheter was removed and a dressing applied. FINDINGS: A total of approximately 1.5 L of bloody fluid was removed. IMPRESSION: Successful ultrasound guided left thoracentesis yielding 1.5 L of pleural fluid. Read by: Soyla Dryer, NP Electronically Signed   By: Sandi Mariscal M.D.   On: 08/23/2020 14:09   US THORACENTESIS ASP PLEURAL SPACE W/IMG GUIDE  Result Date: 08/15/2020 INDICATION: Lymphoma. Shortness of breath. Large left pleural effusion. Request diagnostic therapeutic thoracentesis. EXAM: ULTRASOUND GUIDED LEFT THORACENTESIS MEDICATIONS: 1% plain lidocaine, 8 mL COMPLICATIONS: None immediate. PROCEDURE: An ultrasound guided thoracentesis was thoroughly discussed with the patient and questions answered. The benefits, risks, alternatives and complications were also discussed.  The patient understands and wishes to proceed with the procedure. Written consent was obtained. Ultrasound was performed to localize and mark an adequate pocket of fluid in the left chest. The area was then prepped and draped in the normal sterile fashion. 1% Lidocaine was used for local anesthesia. Under ultrasound guidance a 6 Fr Safe-T-Centesis catheter was introduced. Thoracentesis was performed. The catheter was removed and a dressing applied. FINDINGS: A total of approximately 1.6 L of bloody pleural fluid was removed. Samples were sent to the laboratory as requested by the clinical team. IMPRESSION: Successful ultrasound guided left thoracentesis yielding 1.6 L of pleural fluid. Read by: Ascencion Dike PA-C Electronically Signed   By: Miachel Roux M.D.   On: 08/15/2020 11:23    Microbiology: Recent Results (from the past 240 hour(s))  Body fluid culture w Gram Stain     Status: None   Collection Time: 08/15/20 10:30 AM   Specimen: Pleura; Body Fluid  Result Value Ref Range Status   Specimen Description   Final    PLEURAL Performed at [facility], [address] [place], [place] [address]    Special Requests   Final    PLEURAL Performed at [facility], [address] [place], [place] [address]    Gram Stain   Final    FEW WBC PRESENT, PREDOMINANTLY MONONUCLEAR NO ORGANISMS SEEN    Culture   Final    NO GROWTH 3 DAYS Performed at [facility], [place] [address] [place], [place] [address]    Report Status 08/20/2020 FINAL  Final  Resp Panel by RT-PCR (Flu A&B, Covid) Nasopharyngeal Swab     Status: None   Collection Time: 08/15/20  3:42 PM   Specimen: Nasopharyngeal Swab; Nasopharyngeal(NP) swabs in vial transport medium  Result Value Ref Range Status   SARS Coronavirus 2 by RT PCR NEGATIVE NEGATIVE Final    Comment: (NOTE) SARS-CoV-2 target nucleic acids are NOT DETECTED.  The SARS-CoV-2 RNA is generally detectable in upper respiratory specimens  during the acute phase of infection. The lowest concentration of SARS-CoV-2 viral copies this assay can detect is 138 copies/mL. A negative result does not preclude SARS-Cov-2 infection and should not be used as the sole basis for treatment or other patient management decisions. A negative result may occur with  improper specimen collection/handling, submission of specimen other than nasopharyngeal swab, presence of viral mutation(s) within the areas targeted by this assay, and inadequate number of viral copies(<138 copies/mL). A negative result must be combined with clinical observations, patient history, and epidemiological information. The expected result is Negative.  Fact Sheet for Patients:  EntrepreneurPulse.com.au  Fact Sheet for Healthcare Providers:  IncredibleEmployment.be  This test is no t yet approved or cleared by the [place] FDA and  has been authorized for detection and/or diagnosis of SARS-CoV-2 by FDA under an Emergency Use Authorization (EUA). This EUA will remain  in effect (meaning this test can be used) for the duration of the COVID-19 declaration under Section 564(b)(1) of the Act, 21 U.S.C.section 360bbb-3(b)(1), unless the authorization is terminated  or revoked sooner.       Influenza A by PCR NEGATIVE NEGATIVE Final   Influenza B by PCR NEGATIVE NEGATIVE Final    Comment: (NOTE) The Xpert Xpress SARS-CoV-2/FLU/RSV plus assay is intended as an aid in the diagnosis of influenza from Nasopharyngeal swab specimens and should not be used as a sole basis for treatment. Nasal washings and aspirates are unacceptable for Xpert Xpress SARS-CoV-2/FLU/RSV testing.  Fact Sheet for Patients: EntrepreneurPulse.com.au  Fact Sheet for Healthcare Providers: IncredibleEmployment.be  This test is not yet approved or cleared by the [place] FDA and has been authorized for detection  and/or diagnosis of SARS-CoV-2 by FDA under an Emergency Use Authorization (EUA). This EUA will remain in effect (meaning this test can be used) for the duration of the COVID-19 declaration under Section 564(b)(1) of the Act, 21 U.S.C. section 360bbb-3(b)(1), unless the authorization is terminated or revoked.  Performed at [facility], [address] [place], [place] [address]   Culture, blood (Routine X 2) w Reflex to ID Panel     Status: Abnormal   Collection Time: 08/15/20  4:55 PM   Specimen: BLOOD  Result Value Ref Range Status   Specimen Description   Final    BLOOD BLOOD LEFT ARM Performed at [facility], [address], [place], [place] [address]    Special Requests   Final    BOTTLES DRAWN AEROBIC AND ANAEROBIC Blood Culture adequate volume Performed at [facility], [address] [place], [place] [address]    Culture  Setup Time   Final    GRAM POSITIVE RODS CRITICAL RESULT CALLED TO, READ BACK BY AND VERIFIED WITH: Violeta Gelinas PHARMD [id number] 08/20/20 HNM ANAEROBIC BOTTLE ONLY    Culture (A)  Final    PROPIONIBACTERIUM ACNES Standardized susceptibility testing for this organism is not available. Performed at [facility], [place] [address], [place], [place] [address]    Report Status 08/22/2020 FINAL  Final  Culture, blood (Routine X 2) w Reflex to ID Panel     Status: None   Collection Time: 08/15/20  5:16 PM   Specimen: BLOOD  Result Value Ref Range Status   Specimen Description BLOOD BLOOD LEFT HAND  Final   Special Requests   Final    BOTTLES DRAWN AEROBIC AND ANAEROBIC Blood Culture adequate volume   Culture   Final    NO GROWTH 5 DAYS Performed at  [facility], [address] [place], [place] [address]    Report Status 08/20/2020 FINAL  Final  MRSA PCR Screening     Status: None   Collection Time: 08/18/20 12:35 PM   Specimen: Nasal Mucosa; Nasopharyngeal  Result Value Ref Range Status   MRSA by PCR  NEGATIVE NEGATIVE Final    Comment:        The GeneXpert MRSA Assay (FDA approved for NASAL specimens only), is one component of a comprehensive MRSA colonization surveillance program. It is not intended to diagnose MRSA infection nor to guide or monitor treatment for MRSA infections. Performed at [facility], [place], [place] [address]   SARS CORONAVIRUS 2 (TAT 6-24 HRS) Nasopharyngeal Nasopharyngeal Swab     Status: None   Collection Time: 08/21/20 11:20 AM   Specimen: Nasopharyngeal Swab  Result Value Ref Range Status   SARS Coronavirus 2 NEGATIVE NEGATIVE Final    Comment: (NOTE) SARS-CoV-2 target nucleic acids are NOT DETECTED.  The SARS-CoV-2 RNA is generally detectable in upper and lower respiratory specimens during the acute phase of infection. Negative results do not preclude SARS-CoV-2 infection, do not rule out co-infections with other pathogens, and should not be used as the sole basis for treatment or other patient management decisions. Negative results must be combined with clinical observations, patient history, and epidemiological information. The expected result is Negative.  Fact Sheet for Patients: SugarRoll.be  Fact Sheet for Healthcare Providers: https://www.woods-mathews.com/  This test is not yet approved or cleared by the [place] FDA and  has been authorized for detection and/or diagnosis of SARS-CoV-2 by FDA under an Emergency Use Authorization (EUA). This EUA will remain  in effect (meaning this test can be used) for the duration of the COVID-19 declaration under Se ction 564(b)(1) of the Act, 21 U.S.C. section 360bbb-3(b)(1), unless the authorization is terminated or revoked sooner.  Performed at [facility], [place] [address] [place], [place] [address]   Resp Panel by RT-PCR (Flu A&B, Covid) Nasopharyngeal Swab     Status: None   Collection Time: 08/23/20 12:56 AM    Specimen: Nasopharyngeal Swab; Nasopharyngeal(NP) swabs in vial transport medium  Result Value Ref Range Status   SARS Coronavirus 2 by RT PCR NEGATIVE NEGATIVE Final    Comment: (NOTE) SARS-CoV-2 target nucleic acids are NOT DETECTED.  The SARS-CoV-2 RNA is generally detectable in upper respiratory specimens during the acute phase of infection. The lowest concentration of SARS-CoV-2 viral copies this assay can detect is 138 copies/mL. A negative result does not preclude SARS-Cov-2 infection and should not be used as the sole basis for treatment or other patient management decisions. A negative result may occur with  improper specimen collection/handling, submission of specimen other than nasopharyngeal swab, presence of viral mutation(s) within the areas targeted by this assay, and inadequate number of viral copies(<138 copies/mL). A negative result must be combined with clinical observations, patient history, and epidemiological information. The expected result is Negative.  Fact Sheet for Patients:  EntrepreneurPulse.com.au  Fact Sheet for Healthcare Providers:  IncredibleEmployment.be  This test is no t yet approved or cleared by the [place] FDA and  has been authorized for detection and/or diagnosis of SARS-CoV-2 by FDA under an Emergency Use Authorization (EUA). This EUA will remain  in effect (meaning this test can be used) for the duration of the COVID-19 declaration under Section 564(b)(1) of the Act, 21 U.S.C.section 360bbb-3(b)(1), unless the authorization is terminated  or revoked sooner.  Influenza A by PCR NEGATIVE NEGATIVE Final   Influenza B by PCR NEGATIVE NEGATIVE Final    Comment: (NOTE) The Xpert Xpress SARS-CoV-2/FLU/RSV plus assay is intended as an aid in the diagnosis of influenza from Nasopharyngeal swab specimens and should not be used as a sole basis for treatment. Nasal washings and aspirates are  unacceptable for Xpert Xpress SARS-CoV-2/FLU/RSV testing.  Fact Sheet for Patients: EntrepreneurPulse.com.au  Fact Sheet for Healthcare Providers: IncredibleEmployment.be  This test is not yet approved or cleared by the [place] FDA and has been authorized for detection and/or diagnosis of SARS-CoV-2 by FDA under an Emergency Use Authorization (EUA). This EUA will remain in effect (meaning this test can be used) for the duration of the COVID-19 declaration under Section 564(b)(1) of the Act, 21 U.S.C. section 360bbb-3(b)(1), unless the authorization is terminated or revoked.  Performed at [facility], [address] [place], [place] [address]      Labs: Basic Metabolic Panel: Recent Labs  Lab 08/18/20 0818 08/19/20 0530 08/20/20 0429 08/22/20 1911 08/23/20 0436  NA 130* 134* 135 131* 134*  K 4.2 3.8 3.8 4.2 4.2  CL 94* 98 98 96* 99  CO2 _0 GLUCOSE 115* 117* 124* 123* 106*  BUN _1 CREATININE 0.80 0.82 0.87 0.67 0.66  CALCIUM 9.2 9.3 9.2 9.3 9.1   Liver Function Tests: Recent Labs  Lab 08/22/20 1911  AST 29  ALT 16  ALKPHOS 79  BILITOT 1.2  PROT 7.1  ALBUMIN 3.8   No results for input(s): LIPASE, AMYLASE in the last 168 hours. No results for input(s): AMMONIA in the last 168 hours. CBC: Recent Labs  Lab 08/18/20 0818 08/19/20 0530 08/20/20 0429 08/22/20 1911  WBC 12.8* 11.8* 10.2 10.8*  NEUTROABS  --   --   --  5.6  HGB 10.1* 9.1* 8.9* 9.0*  HCT 30.0* 27.4* 26.7* 27.6*  MCV 88.5 90.4 92.1 93.2  PLT 62* 64* 65* 78*   Cardiac Enzymes: No results for input(s): CKTOTAL, CKMB, CKMBINDEX, TROPONINI in the last 168 hours. BNP: BNP (last 3 results) Recent Labs    08/15/20 1342 08/22/20 2248  BNP 22.8 86.4    ProBNP (last 3 results) No results for input(s): PROBNP in the last 8760 hours.  CBG: No results for input(s): GLUCAP in the last 168  hours.     Signed:  Oswald Hillock MD.  [facility] 08/24/2020, 10:03 AM

## 2020-08-24 NOTE — Progress Notes (Signed)
   08/24/20 0011  Assess: MEWS Score  Temp 98.5 F (36.9 C)  BP 124/60  Pulse Rate (!) 115  Resp 16  SpO2 98 %  O2 Device Room Air  Assess: MEWS Score  MEWS Temp 0  MEWS Systolic 0  MEWS Pulse 2  MEWS RR 0  MEWS LOC 0  MEWS Score 2  MEWS Score Color Yellow  Assess: if the MEWS score is Yellow or Red  Were vital signs taken at a resting state? Yes  Focused Assessment No change from prior assessment  Early Detection of Sepsis Score *See Row Information* Low  MEWS guidelines implemented *See Row Information* Yes  Take Vital Signs  Increase Vital Sign Frequency  Yellow: Q 2hr X 2 then Q 4hr X 2, if remains yellow, continue Q 4hrs  Escalate  MEWS: Escalate Yellow: discuss with charge nurse/RN and consider discussing with provider and RRT  Notify: Charge Nurse/RN  Name of Charge Nurse/RN Notified Christina, RN  Date Charge Nurse/RN Notified 08/24/20  Time Charge Nurse/RN Notified 0100  Pt has been tachycardic since admission.  Her home meds weren't restarted until tonight.  Discussed with pt, and this is baseline.

## 2020-08-24 NOTE — Progress Notes (Signed)
Initial Nutrition Assessment  DOCUMENTATION CODES:   Not applicable  INTERVENTION:   Ensure Enlive po BID, each supplement provides 350 kcal and 20 grams of protein  Continue MVI with minerals daily   NUTRITION DIAGNOSIS:   Inadequate oral intake related to acute illness as evidenced by meal completion < 25%.  GOAL:   Patient will meet greater than or equal to 90% of their needs  MONITOR:   PO intake,Supplement acceptance,Weight trends,Labs  REASON FOR ASSESSMENT:   Malnutrition Screening Tool    ASSESSMENT:   75 yo female admitted with bilateral pleural effusions with acute respiratory failure post Port-A-Cath placement. Pt with recently diagnosed grade 2 follicle lymphoma. PMH includes HTN, chronic diastolic heart failure  5/36 Port-A-Cath placement 4/02 Thoracentesis with 1.5 L bloody fluid  Recorded po intake 50-100%. Attempted to reach patient via phone but unsuccessful.    Pt just seen by inpatient RD on 3/27. At that time pt reporting decreased appetite and decreased po intake related to early satiety. Pt reported she had been drinking Vanilla Boost at home. On 3/27 assessment, pt eating only bites and sips in hospital. Appetite appears to be better at present based on recorded po intake.   Current wt 75 kg; pt has experienced wt loss over the past 2 months.   Labs: reviewed Meds: Vitamin D, MVI with Minerals  Diet Order:   Diet Order            Diet Heart Room service appropriate? Yes; Fluid consistency: Thin  Diet effective now                 EDUCATION NEEDS:   No education needs have been identified at this time  Skin:  Skin Assessment: Reviewed RN Assessment  Last BM:  4/2  Height:   Ht Readings from Last 1 Encounters:  08/22/20 5\' 4"  (1.626 m)    Weight:   Wt Readings from Last 1 Encounters:  08/22/20 75 kg     BMI:  Body mass index is 28.38 kg/m.  Estimated Nutritional Needs:   Kcal:  1800-2000 kcals  Protein:  85-95  g  Fluid:  >/= 1.8 L   Kerman Passey MS, RDN, LDN, CNSC Registered Dietitian III Clinical Nutrition RD Pager and On-Call Pager Number Located in [place]

## 2020-08-24 NOTE — Plan of Care (Signed)
Pt states she feels "real good", appears to be breathing easier. Satting in upper 90's on RA.  Pt had thoracentesis yesterday.  She's independent, only dyspneic with exertion.

## 2020-08-25 ENCOUNTER — Inpatient Hospital Stay: Payer: Medicare HMO | Attending: Hematology and Oncology

## 2020-08-25 ENCOUNTER — Encounter: Payer: Self-pay | Admitting: General Surgery

## 2020-08-25 ENCOUNTER — Other Ambulatory Visit: Payer: Self-pay | Admitting: *Deleted

## 2020-08-25 DIAGNOSIS — C8219 Follicular lymphoma grade II, extranodal and solid organ sites: Secondary | ICD-10-CM | POA: Insufficient documentation

## 2020-08-25 DIAGNOSIS — D649 Anemia, unspecified: Secondary | ICD-10-CM | POA: Insufficient documentation

## 2020-08-25 MED ORDER — LIDOCAINE-PRILOCAINE 2.5-2.5 % EX CREA: TOPICAL_CREAM | CUTANEOUS | 3 refills | Status: DC

## 2020-08-25 MED ORDER — PROCHLORPERAZINE MALEATE 10 MG PO TABS: 10.0000 mg | ORAL_TABLET | Freq: Four times a day (QID) | ORAL | 6 refills | Status: DC | PRN

## 2020-08-25 MED ORDER — LIDOCAINE-PRILOCAINE 2.5-2.5 % EX CREA: 1.0000 "application " | TOPICAL_CREAM | CUTANEOUS | 1 refills | Status: DC | PRN

## 2020-08-25 NOTE — Telephone Encounter (Signed)
Pt scheduled for chemo education today, and lab/MD/*NEW* [facility] for 08/26/20. Pt aware of appts  SRW

## 2020-08-26 ENCOUNTER — Ambulatory Visit: Payer: Medicare HMO

## 2020-08-26 ENCOUNTER — Other Ambulatory Visit: Payer: Medicare HMO

## 2020-08-26 ENCOUNTER — Ambulatory Visit: Payer: Medicare HMO | Admitting: Hematology and Oncology

## 2020-08-27 ENCOUNTER — Inpatient Hospital Stay: Payer: Medicare HMO | Attending: Hematology and Oncology

## 2020-08-27 ENCOUNTER — Inpatient Hospital Stay: Payer: Medicare HMO

## 2020-08-27 ENCOUNTER — Inpatient Hospital Stay: Payer: Medicare HMO | Admitting: Hematology and Oncology

## 2020-08-27 ENCOUNTER — Encounter: Payer: Self-pay | Admitting: Hematology and Oncology

## 2020-08-27 ENCOUNTER — Other Ambulatory Visit: Payer: Self-pay

## 2020-08-27 VITALS — BP 132/73 | HR 103 | Resp 18

## 2020-08-27 VITALS — BP 135/70 | HR 95 | Temp 97.0°F | Resp 18 | Wt 166.0 lb

## 2020-08-27 DIAGNOSIS — Z5189 Encounter for other specified aftercare: Secondary | ICD-10-CM | POA: Diagnosis not present

## 2020-08-27 DIAGNOSIS — Z5112 Encounter for antineoplastic immunotherapy: Secondary | ICD-10-CM | POA: Insufficient documentation

## 2020-08-27 DIAGNOSIS — D696 Thrombocytopenia, unspecified: Secondary | ICD-10-CM | POA: Diagnosis not present

## 2020-08-27 DIAGNOSIS — Z79899 Other long term (current) drug therapy: Secondary | ICD-10-CM | POA: Insufficient documentation

## 2020-08-27 DIAGNOSIS — Z5111 Encounter for antineoplastic chemotherapy: Secondary | ICD-10-CM | POA: Insufficient documentation

## 2020-08-27 DIAGNOSIS — J90 Pleural effusion, not elsewhere classified: Secondary | ICD-10-CM

## 2020-08-27 DIAGNOSIS — Z7189 Other specified counseling: Secondary | ICD-10-CM

## 2020-08-27 DIAGNOSIS — C8218 Follicular lymphoma grade II, lymph nodes of multiple sites: Secondary | ICD-10-CM | POA: Diagnosis not present

## 2020-08-27 DIAGNOSIS — D649 Anemia, unspecified: Secondary | ICD-10-CM | POA: Diagnosis not present

## 2020-08-27 DIAGNOSIS — R161 Splenomegaly, not elsewhere classified: Secondary | ICD-10-CM

## 2020-08-27 DIAGNOSIS — R599 Enlarged lymph nodes, unspecified: Secondary | ICD-10-CM

## 2020-08-27 DIAGNOSIS — C8219 Follicular lymphoma grade II, extranodal and solid organ sites: Secondary | ICD-10-CM

## 2020-08-27 LAB — CBC WITH DIFFERENTIAL/PLATELET
Abs Immature Granulocytes: 0.2 10*3/uL — ABNORMAL HIGH (ref 0.00–0.07)
Basophils Absolute: 0 10*3/uL (ref 0.0–0.1)
Basophils Relative: 0 %
Eosinophils Absolute: 0.2 10*3/uL (ref 0.0–0.5)
Eosinophils Relative: 2 %
HCT: 25.3 % — ABNORMAL LOW (ref 36.0–46.0)
Hemoglobin: 8.3 g/dL — ABNORMAL LOW (ref 12.0–15.0)
Immature Granulocytes: 2 %
Lymphocytes Relative: 20 %
Lymphs Abs: 2.4 10*3/uL (ref 0.7–4.0)
MCH: 31.1 pg (ref 26.0–34.0)
MCHC: 32.8 g/dL (ref 30.0–36.0)
MCV: 94.8 fL (ref 80.0–100.0)
Monocytes Absolute: 1.6 10*3/uL — ABNORMAL HIGH (ref 0.1–1.0)
Monocytes Relative: 13 %
Neutro Abs: 8 10*3/uL — ABNORMAL HIGH (ref 1.7–7.7)
Neutrophils Relative %: 63 %
Platelets: 72 10*3/uL — ABNORMAL LOW (ref 150–400)
RBC: 2.67 MIL/uL — ABNORMAL LOW (ref 3.87–5.11)
RDW: 21.1 % — ABNORMAL HIGH (ref 11.5–15.5)
WBC: 12.5 10*3/uL — ABNORMAL HIGH (ref 4.0–10.5)
nRBC: 0.2 % (ref 0.0–0.2)

## 2020-08-27 LAB — COMPREHENSIVE METABOLIC PANEL
ALT: 11 U/L (ref 0–44)
AST: 26 U/L (ref 15–41)
Albumin: 3.6 g/dL (ref 3.5–5.0)
Alkaline Phosphatase: 78 U/L (ref 38–126)
Anion gap: 7 (ref 5–15)
BUN: 8 mg/dL (ref 8–23)
CO2: 25 mmol/L (ref 22–32)
Calcium: 8.9 mg/dL (ref 8.9–10.3)
Chloride: 97 mmol/L — ABNORMAL LOW (ref 98–111)
Creatinine, Ser: 0.73 mg/dL (ref 0.44–1.00)
GFR, Estimated: >60 mL/min (ref >60)
Glucose, Bld: 110 mg/dL — ABNORMAL HIGH (ref 70–99)
Potassium: 3.9 mmol/L (ref 3.5–5.1)
Sodium: 129 mmol/L — ABNORMAL LOW (ref 135–145)
Total Bilirubin: 0.8 mg/dL (ref 0.3–1.2)
Total Protein: 6.6 g/dL (ref 6.5–8.1)

## 2020-08-27 LAB — LACTATE DEHYDROGENASE: LDH: 212 U/L — ABNORMAL HIGH (ref 98–192)

## 2020-08-27 LAB — URIC ACID: Uric Acid, Serum: 4.1 mg/dL (ref 2.5–7.1)

## 2020-08-27 MED ORDER — PREDNISONE 20 MG PO TABS: 100.0000 mg | ORAL_TABLET | Freq: Every day | ORAL | 5 refills | Status: DC

## 2020-08-27 MED ORDER — DOXORUBICIN HCL CHEMO IV INJECTION 2 MG/ML
50.0000 mg/m2 | Freq: Once | INTRAVENOUS | Status: AC
  Administered 2020-08-27: 92 mg via INTRAVENOUS
  Filled 2020-08-27: qty 46

## 2020-08-27 MED ORDER — ACETAMINOPHEN 325 MG PO TABS
650.0000 mg | ORAL_TABLET | Freq: Once | ORAL | Status: AC
Start: 2020-08-27 — End: 2020-08-27
  Administered 2020-08-27: 650 mg via ORAL
  Filled 2020-08-27: qty 2

## 2020-08-27 MED ORDER — SODIUM CHLORIDE 0.9 % IV SOLN
Freq: Once | INTRAVENOUS | Status: AC
  Filled 2020-08-27: qty 250

## 2020-08-27 MED ORDER — SODIUM CHLORIDE 0.9 % IV SOLN
10.0000 mg | Freq: Once | INTRAVENOUS | Status: AC
  Administered 2020-08-27: 10 mg via INTRAVENOUS
  Filled 2020-08-27: qty 1

## 2020-08-27 MED ORDER — DIPHENHYDRAMINE HCL 50 MG/ML IJ SOLN
50.0000 mg | Freq: Once | INTRAMUSCULAR | Status: AC | PRN
  Administered 2020-08-27: 50 mg via INTRAVENOUS

## 2020-08-27 MED ORDER — PALONOSETRON HCL INJECTION 0.25 MG/5ML
0.2500 mg | Freq: Once | INTRAVENOUS | Status: AC
  Administered 2020-08-27: 0.25 mg via INTRAVENOUS
  Filled 2020-08-27: qty 5

## 2020-08-27 MED ORDER — SODIUM CHLORIDE 0.9 % IV SOLN
375.0000 mg/m2 | Freq: Once | INTRAVENOUS | Status: AC
  Administered 2020-08-27: 700 mg via INTRAVENOUS
  Filled 2020-08-27: qty 20

## 2020-08-27 MED ORDER — SODIUM CHLORIDE 0.9 % IV SOLN
150.0000 mg | Freq: Once | INTRAVENOUS | Status: AC
  Administered 2020-08-27: 150 mg via INTRAVENOUS
  Filled 2020-08-27: qty 5

## 2020-08-27 MED ORDER — DIPHENHYDRAMINE HCL 25 MG PO CAPS
50.0000 mg | ORAL_CAPSULE | Freq: Once | ORAL | Status: AC
Start: 2020-08-27 — End: 2020-08-27
  Administered 2020-08-27: 50 mg via ORAL
  Filled 2020-08-27: qty 2

## 2020-08-27 MED ORDER — SODIUM CHLORIDE 0.9 % IV SOLN
750.0000 mg/m2 | Freq: Once | INTRAVENOUS | Status: AC
  Administered 2020-08-27: 1380 mg via INTRAVENOUS
  Filled 2020-08-27: qty 50

## 2020-08-27 MED ORDER — HEPARIN SOD (PORK) LOCK FLUSH 100 UNIT/ML IV SOLN
500.0000 [IU] | Freq: Once | INTRAVENOUS | Status: AC | PRN
Start: 2020-08-27 — End: 2020-08-27
  Administered 2020-08-27: 500 [IU]
  Filled 2020-08-27: qty 5

## 2020-08-27 MED ORDER — METHYLPREDNISOLONE SODIUM SUCC 125 MG IJ SOLR
125.0000 mg | Freq: Once | INTRAMUSCULAR | Status: AC | PRN
  Administered 2020-08-27: 125 mg via INTRAVENOUS

## 2020-08-27 MED ORDER — VINCRISTINE SULFATE CHEMO INJECTION 1 MG/ML
2.0000 mg | Freq: Once | INTRAVENOUS | Status: AC
  Administered 2020-08-27: 2 mg via INTRAVENOUS
  Filled 2020-08-27: qty 2

## 2020-08-27 NOTE — Progress Notes (Signed)
Cardiology Office Note:    Date:  08/29/2020   ID:  ZAKARA PARKEY, DOB [birth year], MRN [id number]  PCP:  [name], MD  [facility] Cardiologist:  Nelva Bush, MD  [facility] Electrophysiologist:  None   Referring MD: [name], MD   Chief Complaint: hospital follow-up  History of Present Illness:    Natasha Farrell is a 75 y.o. female with a hx of HTN, HLD, thyroid disease s/o right thyroidectomy 2012 who presents for follow-up. In February the patient presented to [facility] for tachycardia dating bck to early January when she complained of HA, fever, chills, cough, fatigue, night sweats, dizziness. In the ED CT head showed widespread lymphadenopathy in the neck, chest, and visualized portions of the upper abdomen along with marked splenomegaly, and no PE.  Findings were concerning for lymphoma or leukemia.  EKG showed sinus tachycardia.  She was seen by Dr. Saunders Revel on 07/23/2020 and reported elevated heart rates, worse with activity.  Also reported occasional chest tightness and shortness of breath.  She was on metoprolol and escalation was tried although this caused worsening fatigue.  In the visit heart rate was 139 and ER evaluation was recommended, although patient deferred.  An emergent echo was ordered.  Metoprolol was switched to Coreg.  Concerning underlying process was causing the symptoms.  Echo 07/24/2020 showed LVEF 60 to 65%, no wall motion abnormality, grade 1 diastolic dysfunction, RV systolic normal function, left pleural effusion.  Patient had a biopsy of the lymph nodes and bone marrow.  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  The patient was last seen 08/06/2020 and was doing well from a cardiac standpoint.  He had some GI issues at that time.  Rate was better 106 bpm on Coreg.  Coreg was increased to 25 mg twice daily.  Seen by Dr. Bary Castilla 08/12/20 and was sob. Thoracentesis on 3/25 yielding 1.6L pleural fluid.    She went back to the hospital that night 3/25 for worsening SOB. CXR showed re-expansion of pulmonary edema vs infiltration in the left lower lobe. She was given lasix and discharged 3/30.  Port-a-cath placement 08/22/20  The patient again went back to the ER that same day, 4/1, and was admitted until 4/3, for hypoxia due to b/l pleural effusion requiring thoracentesis. She underwent thoracentesis removing 1.5L bloody fluid. She felt better, was off O2, and was discharged home.   Today, the patient is in SR with heart rate 96bpm. She is starting chemo next week, on Wednesday.  Two days ago she had an allergic reacton to a chemo med, but was given benadryl, and sob and chest tightness resolved. She is getting blood next week by oncology. No further chest pain. Shortness of breath is stable. O2 is 96% today. She has trace lower leg edema on exam. She was started on salt tablets for low sodium, most recent 130. She has some trouble laying flat, but seems to be chronic and stable since the pleural effusions.  BP today is a little high. Says bp is better at home.  Past Medical History:  Diagnosis Date  . Hx of hysterectomy   . Hyperlipidemia   . Hypertension   . Thyroid disease    rt thryroidectomy  09/25/2010    Past Surgical History:  Procedure Laterality Date  . BREAST BIOPSY Right 04/21/2020   hydromark 3 [place] bx path pending  . LYMPH NODE BIOPSY Left 08/01/2020   Procedure: LYMPH  NODE BIOPSY;  Surgeon: Robert Bellow, MD;  Location: [facility] ORS;  Service: General;  Laterality: Left;  . PORTACATH PLACEMENT Left 08/22/2020   Procedure: INSERTION PORT-A-CATH;  Surgeon: Robert Bellow, MD;  Location: [facility] ORS;  Service: General;  Laterality: Left;    Current Medications: Current Meds  Medication Sig  . acetaminophen (TYLENOL) 500 MG tablet Take 500 mg by mouth every 6 (six) hours as needed for moderate pain, fever or headache.  . allopurinol (ZYLOPRIM) 300 MG tablet Take 1 tablet (300 mg  total) by mouth daily.  . carvedilol (COREG) 25 MG tablet Take 1 tablet (25 mg total) by mouth 2 (two) times daily.  . cholecalciferol (VITAMIN D3) 25 MCG (1000 UNIT) tablet Take 2,000 Units by mouth daily.  . cyanocobalamin 1000 MCG tablet Take 1,000 mcg by mouth daily.  . feeding supplement (ENSURE ENLIVE / ENSURE PLUS) LIQD Take 237 mLs by mouth 2 (two) times daily between meals.  . folic acid (FOLVITE) 1 MG tablet Take 1 tablet (1 mg total) by mouth daily.  Marland Kitchen levothyroxine (SYNTHROID) 50 MCG tablet Take 50 mcg by mouth daily before breakfast.  . lidocaine-prilocaine (EMLA) cream Apply 1 application topically as needed. Apply small amount to port site at least 1 hour prior to it being accessed, cover with plastic wrap  . loratadine (CLARITIN) 10 MG tablet Take 10 mg by mouth daily as needed for allergies.  . Multiple Vitamin (MULTIVITAMIN WITH MINERALS) TABS tablet Take 1 tablet by mouth daily.  Marland Kitchen omeprazole (PRILOSEC) 20 MG capsule Take 1 capsule (20 mg total) by mouth daily.  . ondansetron (ZOFRAN) 8 MG tablet Take 1 tablet (8 mg total) by mouth every 8 (eight) hours as needed for nausea or vomiting.  . pravastatin (PRAVACHOL) 40 MG tablet Take 40 mg by mouth daily.  . predniSONE (DELTASONE) 20 MG tablet Take 5 tablets (100 mg total) by mouth daily. Take with food on days 1-5 of chemotherapy.  . prochlorperazine (COMPAZINE) 10 MG tablet Take 1 tablet (10 mg total) by mouth every 6 (six) hours as needed (Nausea or vomiting).  . sodium chloride 1 g tablet Take 1 tablet (1 g total) by mouth 2 (two) times daily with a meal.  . spironolactone (ALDACTONE) 50 MG tablet Take 50 mg by mouth daily.     Allergies:   Lisinopril   Social History   Socioeconomic History  . Marital status: Married    Spouse name: Not on file  . Number of children: Not on file  . Years of education: Not on file  . Highest education level: Not on file  Occupational History  . Not on file  Tobacco Use  . Smoking  status: Never Smoker  . Smokeless tobacco: Never Used  Vaping Use  . Vaping Use: Never used  Substance and Sexual Activity  . Alcohol use: No  . Drug use: No  . Sexual activity: Not on file  Other Topics Concern  . Not on file  Social History Narrative  . Not on file   Social Determinants of Health   Financial Resource Strain: Not on file  Food Insecurity: Not on file  Transportation Needs: Not on file  Physical Activity: Not on file  Stress: Not on file  Social Connections: Not on file     Family History: The patient's family history includes Breast cancer (age of onset: 33) in her sister; Colon cancer in her sister; Kidney cancer in her sister; Prostate cancer in her father.  ROS:   Please see the history of present illness.     All other systems reviewed and are negative.  EKGs/Labs/Other Studies Reviewed:    The following studies were reviewed today:  Echo 07/24/20 1. Left ventricular ejection fraction, by estimation, is 60 to 65%. The  left ventricle has normal function. The left ventricle has no regional  wall motion abnormalities. Left ventricular diastolic parameters are  consistent with Grade I diastolic  dysfunction (impaired relaxation). The average left ventricular global  longitudinal strain is -11.7 %. The global longitudinal strain is  abnormal.  2. Right ventricular systolic function is normal. The right ventricular  size is normal. There is mildly elevated pulmonary artery systolic  pressure. The estimated right ventricular systolic pressure is 25.0 mmHg.  3. Left pleural effusion noted, 7 cm    EKG:  EKG is  ordered today.  The ekg ordered today demonstrates NSR, 96bpm, no significant change  Recent Labs: 08/16/2020: TSH 3.890 08/22/2020: B Natriuretic Peptide 86.4 08/27/2020: ALT 11 08/28/2020: BUN 17; Creatinine, Ser 0.99; Hemoglobin 8.8; Platelets 97; Potassium 4.4; Sodium 130  Recent Lipid Panel No results found for: CHOL, TRIG, HDL, CHOLHDL,  VLDL, LDLCALC, LDLDIRECT    Physical Exam:    VS:  BP 140/68 (BP Location: Left Arm, Patient Position: Sitting, Cuff Size: Large)   Pulse 96   Ht 5\' 4"  (1.626 m)   Wt 168 lb (76.2 kg)   SpO2 96%   BMI 28.84 kg/m     Wt Readings from Last 3 Encounters:  08/29/20 168 lb (76.2 kg)  08/27/20 166 lb (75.3 kg)  08/22/20 165 lb 5.5 oz (75 kg)     GEN:  Well nourished, well developed in no acute distress HEENT: Normal NECK: No JVD; No carotid bruits LYMPHATICS: No lymphadenopathy CARDIAC: RRR, no murmurs, rubs, gallops RESPIRATORY:  Clear to auscultation without rales, wheezing or rhonchi  ABDOMEN: Soft, non-tender, non-distended MUSCULOSKELETAL:  Trace edema; No deformity  SKIN: Warm and dry NEUROLOGIC:  Alert and oriented x 3 PSYCHIATRIC:  Normal affect   ASSESSMENT:    1. Sinus tachycardia   2. Essential hypertension   3. Shortness of breath   4. Hyponatremia    PLAN:    In order of problems listed above:  Sinus tachycardia Tachycardia suspected secondary to underlying cancer. Echo showed normal EF. Coreg increased at the last visit with improvement. Today EKG shows SR with heart rate in the 90s. Denies chest pain. Has some residual SOB from recent recurrent lung issues. No palpitations. Can consider addition of diltiazem for further heart rate reduction.  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 Suspected secondary to underlying cancer. Today she reports breathing is stable, has chronic stable orthopnea. O2 today is 96%. She is being followed closely by oncology. PET scan showed widespread adenopathy. Plan to take chemo.   Hypertension Mildly elevated today. Patient says its generally lower. Amlodipine d/c due to swelling. History of angioedema on ACE, would be cautious with ARB. No HCTZ with hyponatremia. Given hyponatremia I will decrease spiro to 25mg  daily. She will check bps  for a week. If elevated can add diltiazem, this would also help heart rate.   Hyponatremia Started on salt tablets. Most recent 130, which appears to be stable. Decrease Spiro as above. She has lab draw 4/11.   Disposition: Follow up in 1 month(s) with APP/MD    Signed, Natasha Farrell Ninfa Meeker, PA-C  08/29/2020  2:26 PM    [facility]

## 2020-08-27 NOTE — Progress Notes (Signed)
Patient reports trouble sleeping, due to coughing, she has to go sit in the chair upright for some relief. She has no appetite but is drinking ensure. She reports fatigue.

## 2020-08-27 NOTE — Progress Notes (Signed)
Plts 74. MD aware and ok to treat. Patient has massive spenomegaly and marrow involvement.

## 2020-08-27 NOTE — Progress Notes (Signed)
Rituxan stopped 1428 due to patient having shortness of breath.  Benadryl and Solu-medrol given. Resumed Rituxan after 30 minutes.  Patient tolerated remainder of infusion with no issues.

## 2020-08-27 NOTE — Progress Notes (Signed)
[facility]  [address], [address] [place], [place] [address] Phone: [phone]  Fax: [phone]   Clinic Day:  08/27/2020  Referring physician: [name], MD  Chief Complaint: Natasha Farrell is a 75 y.o. female with grade II stage IVB follicular lymphoma who is seen for assessment prior to cycle #1 RCHOP.  HPI:  The patient was last seen in the medical oncology clinic on 08/14/2020. At that time, she was fatigued.  She had progressive adenopathy and an enlarging left pleural effusion. Hematocrit was 30.3, hemoglobin 10.5, MCV 84.6, platelets 41,000, WBC 14,900. Sodium was 122. Uric acid was 3.3.  Left thoracentesis on 08/15/2020 yielded 1.6 liters of pleural fluid. Cytology showed no evidence of malignancy. Fluid LDH was 128, protein 5.4, glucose 98. Flow cytometry showed no significant immunophenotypic abnormality.  The patient was admitted to [facility] from 08/15/2020 - 08/20/2020 for acute respiratory failure with hypoxia and sepsis. She was started on IV Rocephin and azithromycin. She was felt to have re-expansion pulmonary edema.  She was seen by ID.  She received 40 mg Lasix.  Port-a-cath was placed by Dr. Bary Castilla on 08/22/2020.  The patient was admitted to [facility] from 08/22/2020 - 08/24/2020. She had increased shortness of breath after her port placement and was requiring 2 liters/min oxygen. She underwent thoracentesis for left pleural effusion, removing 1.5 liters of bloody fluid. CXR showed improvement of pleural effusion and the patient no longer required oxygen.  During the interim, she has been "ok".  She feels a bit weak today. She also has a non-productive cough. She sleeps in a recliner because she cannot lay flat. She has occasional "twinges" in her back and sides. Her appetite is poor but she is drinking 2 Ensures per day. She has headaches in the morning but they go away when she drinks some water. She denies any fever.  She is taking  allopurinol. She is trying to stay active.  She consents to treatment today.   Past Medical History:  Diagnosis Date  . Hx of hysterectomy   . Hyperlipidemia   . Hypertension   . Thyroid disease    rt thryroidectomy  09/25/2010    Past Surgical History:  Procedure Laterality Date  . BREAST BIOPSY Right 04/21/2020   hydromark 3 [place] bx path pending  . LYMPH NODE BIOPSY Left 08/01/2020   Procedure: LYMPH NODE BIOPSY;  Surgeon: Robert Bellow, MD;  Location: [facility] ORS;  Service: General;  Laterality: Left;  . PORTACATH PLACEMENT Left 08/22/2020   Procedure: INSERTION PORT-A-CATH;  Surgeon: Robert Bellow, MD;  Location: [facility] ORS;  Service: General;  Laterality: Left;    Family History  Problem Relation Age of Onset  . Breast cancer Sister 66  . Colon cancer Sister   . Kidney cancer Sister     Social History:  reports that she has never smoked. She has never used smokeless tobacco. She reports that she does not drink alcohol and does not use drugs. She does not use alcohol or drugs. She denies exposure to radiation or toxins. She is a retired Librarian, academic. Her husband's name is Josph Macho. The patient is accompanied by Josph Macho today.  Allergies:  Allergies  Allergen Reactions  . Lisinopril Swelling    angioedema    Current Medications: Current Outpatient Medications  Medication Sig Dispense Refill  . acetaminophen (TYLENOL) 500 MG tablet Take 500 mg by mouth every 6 (six) hours as needed for moderate pain, fever or headache.    Marland Kitchen  allopurinol (ZYLOPRIM) 300 MG tablet Take 1 tablet (300 mg total) by mouth daily. 30 tablet 2  . carvedilol (COREG) 25 MG tablet Take 1 tablet (25 mg total) by mouth 2 (two) times daily. 180 tablet 3  . cholecalciferol (VITAMIN D3) 25 MCG (1000 UNIT) tablet Take 2,000 Units by mouth daily.    . feeding supplement (ENSURE ENLIVE / ENSURE PLUS) LIQD Take 237 mLs by mouth 2 (two) times daily between meals. 237 mL 12  . levothyroxine (SYNTHROID) 50 MCG tablet Take  50 mcg by mouth daily before breakfast.    . lidocaine-prilocaine (EMLA) cream Apply 1 application topically as needed. Apply small amount to port site at least 1 hour prior to it being accessed, cover with plastic wrap 30 g 1  . lidocaine-prilocaine (EMLA) cream Apply to affected area once 30 g 3  . loratadine (CLARITIN) 10 MG tablet Take 10 mg by mouth daily as needed for allergies.    . Multiple Vitamin (MULTIVITAMIN WITH MINERALS) TABS tablet Take 1 tablet by mouth daily.    Marland Kitchen omeprazole (PRILOSEC) 20 MG capsule Take 1 capsule (20 mg total) by mouth daily. 30 capsule 3  . pravastatin (PRAVACHOL) 40 MG tablet Take 40 mg by mouth daily.    . prochlorperazine (COMPAZINE) 10 MG tablet Take 1 tablet (10 mg total) by mouth every 6 (six) hours as needed (Nausea or vomiting). 30 tablet 6  . sodium chloride 1 g tablet Take 1 tablet (1 g total) by mouth 2 (two) times daily with a meal. 60 tablet 0  . spironolactone (ALDACTONE) 50 MG tablet Take 50 mg by mouth daily.    . ondansetron (ZOFRAN) 8 MG tablet Take 1 tablet (8 mg total) by mouth every 8 (eight) hours as needed for nausea or vomiting. (Patient not taking: Reported on 08/27/2020) 20 tablet 3  . predniSONE (DELTASONE) 20 MG tablet Take 5 tablets (100 mg total) by mouth daily. Take with food on days 1-5 of chemotherapy. 25 tablet 5   No current facility-administered medications for this visit.    Review of Systems  Constitutional: Positive for malaise/fatigue and weight loss (9 lbs). Negative for chills, diaphoresis and fever.  HENT: Negative for congestion, ear discharge, ear pain, hearing loss, nosebleeds, sinus pain, sore throat and tinnitus.   Eyes: Negative for blurred vision.  Respiratory: Positive for cough (non productive). Negative for hemoptysis, sputum production and shortness of breath.   Cardiovascular: Positive for orthopnea (sleeps in recliner). Negative for chest pain, palpitations and leg swelling.  Gastrointestinal: Negative for  abdominal pain, blood in stool, constipation, diarrhea, heartburn, melena, nausea and vomiting.       Poor appetite  Genitourinary: Negative for dysuria, frequency, hematuria and urgency.  Musculoskeletal: Negative for back pain, joint pain, myalgias and neck pain.       Occasional "twinges" in her back and sides.  Skin: Negative for itching and rash.  Neurological: Positive for weakness (generalized) and headaches (in the mornings). Negative for dizziness, tingling and sensory change.  Endo/Heme/Allergies: Does not bruise/bleed easily.  Psychiatric/Behavioral: Negative for depression and memory loss. The patient is not nervous/anxious and does not have insomnia.   All other systems reviewed and are negative.  Performance status (ECOG): 1-2  Vitals Blood pressure 135/70, pulse 95, temperature (!) 97 F (36.1 C), temperature source Tympanic, resp. rate 18, weight 166 lb (75.3 kg), SpO2 100 %.   Physical Exam Vitals and nursing note reviewed.  Constitutional:      General: She is  not in acute distress.    Appearance: She is not diaphoretic.  HENT:     Head: Normocephalic and atraumatic.     Mouth/Throat:     Mouth: Mucous membranes are moist.     Pharynx: Oropharynx is clear.  Eyes:     General: No scleral icterus.    Extraocular Movements: Extraocular movements intact.     Conjunctiva/sclera: Conjunctivae normal.     Pupils: Pupils are equal, round, and reactive to light.     Comments: Glasses.  Brown eyes.  Cardiovascular:     Rate and Rhythm: Normal rate and regular rhythm.     Heart sounds: Normal heart sounds. No murmur heard.   Pulmonary:     Effort: Pulmonary effort is normal. No respiratory distress.     Breath sounds: No wheezing or rales.     Comments: Fluid 1/3 up in left lung. Fluid right base. Chest:     Chest wall: No tenderness.  Breasts:     Right: Axillary adenopathy (multiple right axillary lymph nodes) present. No supraclavicular adenopathy.     Left:  Axillary adenopathy (multiple left axillary lymph nodes) present. No supraclavicular adenopathy.    Abdominal:     General: Bowel sounds are normal. There is no distension.     Palpations: Abdomen is soft. There is hepatomegaly (5 fingerbreadths below right costal margin) and splenomegaly (4 fingerbreadths below left costal margin.). There is no mass.     Tenderness: There is no abdominal tenderness. There is no guarding or rebound.  Musculoskeletal:        General: No swelling or tenderness. Normal range of motion.     Cervical back: Normal range of motion and neck supple.     Right lower leg: Edema (1+) present.     Left lower leg: Edema (1+) present.  Lymphadenopathy:     Head:     Right side of head: Submandibular and preauricular adenopathy present. No posterior auricular or occipital adenopathy.     Left side of head: Submandibular and preauricular adenopathy present. No posterior auricular or occipital adenopathy.     Cervical: Cervical adenopathy (right posterior low cervical lymph nodes. Cluster of low cervical lymph nodes on the left) present.     Upper Body:     Right upper body: Axillary adenopathy (multiple right axillary lymph nodes) present. No supraclavicular adenopathy.     Left upper body: Axillary adenopathy (multiple left axillary lymph nodes) present. No supraclavicular adenopathy.     Lower Body: Right inguinal adenopathy present. No left inguinal adenopathy.  Skin:    General: Skin is warm and dry.  Neurological:     Mental Status: She is alert and oriented to person, place, and time.  Psychiatric:        Behavior: Behavior normal.        Thought Content: Thought content normal.        Judgment: Judgment normal.    Infusion on 08/27/2020  Component Date Value Ref Range Status  . Uric Acid, Serum 08/27/2020 4.1  2.5 - 7.1 mg/dL Final   Performed at [facility], [address] [place], [place] [address]  . LDH 08/27/2020 212* 98 - 192 U/L Final    Performed at [facility], [address], [place], [place] [address]  . Sodium 08/27/2020 129* 135 - 145 mmol/L Final  . Potassium 08/27/2020 3.9  3.5 - 5.1 mmol/L Final  . Chloride 08/27/2020 97* 98 - 111 mmol/L Final  .  CO2 08/27/2020 25  22 - 32 mmol/L Final  . Glucose, Bld 08/27/2020 110* 70 - 99 mg/dL Final   Glucose reference range applies only to samples taken after fasting for at least 8 hours.  . BUN 08/27/2020 8  8 - 23 mg/dL Final  . Creatinine, Ser 08/27/2020 0.73  0.44 - 1.00 mg/dL Final  . Calcium 08/27/2020 8.9  8.9 - 10.3 mg/dL Final  . Total Protein 08/27/2020 6.6  6.5 - 8.1 g/dL Final  . Albumin 08/27/2020 3.6  3.5 - 5.0 g/dL Final  . AST 08/27/2020 26  15 - 41 U/L Final  . ALT 08/27/2020 11  0 - 44 U/L Final  . Alkaline Phosphatase 08/27/2020 78  38 - 126 U/L Final  . Total Bilirubin 08/27/2020 0.8  0.3 - 1.2 mg/dL Final  . GFR, Estimated 08/27/2020 >60  >60 mL/min Final   Comment: (NOTE) Calculated using the CKD-EPI Creatinine Equation (2021)   . Anion gap 08/27/2020 7  5 - 15 Final   Performed at [facility], [address], [place], [place] [address]  . WBC 08/27/2020 12.5* 4.0 - 10.5 K/uL Final  . RBC 08/27/2020 2.67* 3.87 - 5.11 MIL/uL Final  . Hemoglobin 08/27/2020 8.3* 12.0 - 15.0 g/dL Final  . HCT 08/27/2020 25.3* 36.0 - 46.0 % Final  . MCV 08/27/2020 94.8  80.0 - 100.0 fL Final  . MCH 08/27/2020 31.1  26.0 - 34.0 pg Final  . MCHC 08/27/2020 32.8  30.0 - 36.0 g/dL Final  . RDW 08/27/2020 21.1* 11.5 - 15.5 % Final  . Platelets 08/27/2020 72* 150 - 400 K/uL Final   Comment: Immature Platelet Fraction may be clinically indicated, consider ordering this additional test [id number]   . nRBC 08/27/2020 0.2  0.0 - 0.2 % Final  . Neutrophils Relative % 08/27/2020 63  % Final  . Neutro Abs 08/27/2020 8.0* 1.7 - 7.7 K/uL Final  . Lymphocytes Relative 08/27/2020 20  % Final  . Lymphs Abs 08/27/2020 2.4  0.7 - 4.0 K/uL Final  .  Monocytes Relative 08/27/2020 13  % Final  . Monocytes Absolute 08/27/2020 1.6* 0.1 - 1.0 K/uL Final  . Eosinophils Relative 08/27/2020 2  % Final  . Eosinophils Absolute 08/27/2020 0.2  0.0 - 0.5 K/uL Final  . Basophils Relative 08/27/2020 0  % Final  . Basophils Absolute 08/27/2020 0.0  0.0 - 0.1 K/uL Final  . Immature Granulocytes 08/27/2020 2  % Final  . Abs Immature Granulocytes 08/27/2020 0.20* 0.00 - 0.07 K/uL Final   Performed at [facility], [address], [place], [place] [address]    Assessment:  SHARNAE WINFREE is a 75 y.o. female with grade II stage IVB follicular lymphoma s/p excisional biopsy on 08/01/2020.  She presented with extensive adenopathy and massive splenomegaly.  Right axillary ultrasound guided biopsy on 04/21/2020 revealed fragments of lymph node with reactive follicular hyperplasia. There was no definite evidence of malignancy. Flow cytometry revealed a CD10+ B cell population. Clonality could not be evaluated due to non-specific light chain binding. There was no evidence of a T cell lymphoproliferative disorder.  Chest CT angiogram on 07/21/2020 revealed extensive lymphadenopathy throughout the neck, chest, and visualized portions of the abdomen, along with marked splenomegaly.  Largest right axillary lymph node was 2.1 x 1.6 cm and left axillary lymph node 3.0 x 1.5 cm.  There were enlarged lymph nodes interposed between the brachiocephalic vein and left subclavian measuring 2.1 x 1.6 cm.  Overall, findings favored leukemia or lymphoma. There was no evidence of pulmonary embolus. There was a small left pleural effusion.   Work-up on 07/28/2020 revealed a hematocrit of 36.4, hemoglobin 12.3, platelets 63,000, WBC 8,500 ([place] 5200).  Calcium was 10.1 with an albumin of 4.5. Total protein was 9.0 (6.5-8.1). G6PD assay was 7.7 (normal). Normal studies included: hepatitis B surface antigen, hepatitis B core antibody, hepatitis C antibody, and HIV antibody.  LDH  was 182. Uric acid was 9.3 (2.5-7.1); she began allopurinol.  Bone marrow on 08/04/2020 revealed variably cellular bone marrow with trilineage hematopoiesis and involvement by an atypical lymphoid proliferation  The bone marrow core biopsy demonstrated involvement by an atypical lymphoid proliferation with paratrabecular lymphoid aggregates and  patchy infiltration of the bone marrow space with accompanying fibrosis. The infiltrate was composed of mixed B-cells and T-cells. At least a subset of the B-cells co-expressed CD10 and BCL6 without definitive BCL2 expression. Scattered larger CD20-positive larger B-cells were also present. Flow cytometry showed no definitive atypical B-cell population. No immunophenotypically aberrant T-cell population was identified. There were no increase in blasts. Monocytes showed heterogeneous CD56 expression.  Cytogenetics were normal (23, XX).  PET scan on 08/12/2020 revealed widespread adenopathy, visceral involvement and signs of near diffuse, multifocal bony involvement, distribution most c/w lymphoma, Deauville category 5. There was ileal involvement as well. Peritoneal involvement was suggested as well with nodular changes about the peritoneum but without significant FDG uptake. There was paraspinal soft tissue without frank bony destruction. Scattered areas of sclerosis underestimated the degree of bony involvement. Abdominal ascites may be related to anasarca and or lymphatic congestion with marked enlargement of LEFT-sided pleural effusion and mild enlargement of small RIGHT-sided pleural fluid.  Thoracentesis is scheduled for 08/15/2020.  Echo on 07/24/2020 revealed an EF of 60-65%.  She has had a recurrent left sided pleural effusion.  She underwent thoracentesis on 08/15/2020 (1.6 liters) and 08/23/2020 (1.5 liters).  She has a family history of malignancy.  Her father died from prostate cancer. Her sister had breast cancer. Another sister had colon cancer and  kidney cancer.  She was admitted to [facility] from 08/15/2020 - 08/20/2020 for acute respiratory failure with hypoxia and sepsis. She was felt to have re-expansion pulmonary edema.   She was admitted to [facility] from 08/22/2020 - 08/24/2020. She had increased shortness of breath after her port placement. She underwent thoracentesis for recurrent left pleural effusion.  The patient received the COVID-19 vaccine in 06/2019 and 07/2019. The Moderna Booster was on 03/17/2020.   Symptomatically, she is fatigued.  Exam reveals diffuse adenopathy.  Platelet count is 72,000.  Plan: 1.   Labs today: CBC with diff, CMP, LDH, uric acid 2.   Grade II Stage IVB follicular lymphoma  Review lymph node biopsy results.  Bone marrow on 08/04/2020 revealed involvement by an atypical lymphoid proliferation.   Flow cytometry was negative.  PET scan on 08/12/2020 revealed widespread adenopathy, visceral involvement and near diffuse, multifocal bony involvement.  Discuss initial discission regarding bendamustine and Rituxan OR RCHOP.   Discuss concern for possible transformation based on rapidly progressive disease and SUVs on PET scan.   Decision made to pursue RCHOP.  Treatment plan reviewed with patient:  RCHOP x 6 cycles with Udencya support.   Potential side effects reviewed.     Discuss possible tumor lysis.      Hydration and allopurinol discussed.    Rasburicase if needed.   Patient consented to treatment.  Labs reviewed.  Cycle #1 RCHOP  today.   Rx:  Prednisone 100 mg po q day x 5 days.  Discuss symptom management.  She has antiemeticsat home to use on a prn bases.  Interventions are adequate. 3.   Thrombocytopenia  Platelet count is 72,000.  Etiology felt secondary to massive hepatosplenomegaly.  Monitor closely with initiation of chemotherapy. 4.   Normocytic anemia  Hematocrit 25.3.  Hemoglobin 8.3.  MCV 94.8.  Ferritin 271 with an iron sat 25% and a TIBC 330.  Folate 5.1 (low) and B12 206 (low) on  08/21/2018.  Diet poor.  Blood loss associated with recurrent bloody effusion.  Ensure patient on B12 and folate supplementation.  Monitor closely as may require transfusion support. 5.   Recurrent left pleural effusion  Patient has undergone thoracentesis x 2.  Patient to contact clinic if worsening shortness of breath. 6.   Cycle #1 RCHOP today. 7.   RTC tomorrow for labs (CBC, BMP, uric acid, hold tube) and Udencya. 8.   RTC on 09/01/2020 for MD assessment and labs (CBC, BMP, uric acid, hold tube).  I discussed the assessment and treatment plan with the patient.  The patient was provided an opportunity to ask questions and all were answered.  The patient agreed with the plan and demonstrated an understanding of the instructions.  The patient was advised to call back if the symptoms worsen or if the condition fails to improve as anticipated.  I provided 26 minutes of face-to-face time during this this encounter and > 50% was spent counseling as documented under my assessment and plan.  An additional 15 minutes were spent reviewing her chart (Epic and Care Everywhere) including notes, labs, and imaging studies.    Gia Lusher C. Mike Gip, MD, PhD    08/27/2020, 9:57 AM  I, Mirian Mo Tufford, am acting as Education administrator for [employer]. Mike Gip, MD, PhD.  I, Rafferty Postlewait C. Mike Gip, MD, have reviewed the above documentation for accuracy and completeness, and I agree with the above.

## 2020-08-28 ENCOUNTER — Telehealth: Payer: Self-pay

## 2020-08-28 ENCOUNTER — Inpatient Hospital Stay: Payer: Medicare HMO

## 2020-08-28 ENCOUNTER — Other Ambulatory Visit: Payer: Medicare HMO

## 2020-08-28 ENCOUNTER — Other Ambulatory Visit: Payer: Self-pay | Admitting: Hematology and Oncology

## 2020-08-28 DIAGNOSIS — E538 Deficiency of other specified B group vitamins: Secondary | ICD-10-CM

## 2020-08-28 DIAGNOSIS — C8219 Follicular lymphoma grade II, extranodal and solid organ sites: Secondary | ICD-10-CM

## 2020-08-28 DIAGNOSIS — Z5112 Encounter for antineoplastic immunotherapy: Secondary | ICD-10-CM | POA: Diagnosis not present

## 2020-08-28 DIAGNOSIS — D649 Anemia, unspecified: Secondary | ICD-10-CM | POA: Insufficient documentation

## 2020-08-28 LAB — BASIC METABOLIC PANEL
Anion gap: 5 (ref 5–15)
BUN: 17 mg/dL (ref 8–23)
CO2: 26 mmol/L (ref 22–32)
Calcium: 9.1 mg/dL (ref 8.9–10.3)
Chloride: 99 mmol/L (ref 98–111)
Creatinine, Ser: 0.99 mg/dL (ref 0.44–1.00)
GFR, Estimated: >60 mL/min (ref >60)
Glucose, Bld: 175 mg/dL — ABNORMAL HIGH (ref 70–99)
Potassium: 4.4 mmol/L (ref 3.5–5.1)
Sodium: 130 mmol/L — ABNORMAL LOW (ref 135–145)

## 2020-08-28 LAB — CBC WITH DIFFERENTIAL/PLATELET
Abs Immature Granulocytes: 0.2 10*3/uL — ABNORMAL HIGH (ref 0.00–0.07)
Basophils Absolute: 0 10*3/uL (ref 0.0–0.1)
Basophils Relative: 0 %
Eosinophils Absolute: 0 10*3/uL (ref 0.0–0.5)
Eosinophils Relative: 0 %
HCT: 26.4 % — ABNORMAL LOW (ref 36.0–46.0)
Hemoglobin: 8.8 g/dL — ABNORMAL LOW (ref 12.0–15.0)
Immature Granulocytes: 2 %
Lymphocytes Relative: 5 %
Lymphs Abs: 0.7 10*3/uL (ref 0.7–4.0)
MCH: 31.5 pg (ref 26.0–34.0)
MCHC: 33.3 g/dL (ref 30.0–36.0)
MCV: 94.6 fL (ref 80.0–100.0)
Monocytes Absolute: 0.4 10*3/uL (ref 0.1–1.0)
Monocytes Relative: 3 %
Neutro Abs: 12.3 10*3/uL — ABNORMAL HIGH (ref 1.7–7.7)
Neutrophils Relative %: 90 %
Platelets: 97 10*3/uL — ABNORMAL LOW (ref 150–400)
RBC: 2.79 MIL/uL — ABNORMAL LOW (ref 3.87–5.11)
RDW: 20.9 % — ABNORMAL HIGH (ref 11.5–15.5)
WBC: 13.7 10*3/uL — ABNORMAL HIGH (ref 4.0–10.5)
nRBC: 0 % (ref 0.0–0.2)

## 2020-08-28 LAB — SAMPLE TO BLOOD BANK

## 2020-08-28 LAB — URIC ACID: Uric Acid, Serum: 3.9 mg/dL (ref 2.5–7.1)

## 2020-08-28 MED ORDER — FOLIC ACID 1 MG PO TABS: 1.0000 mg | ORAL_TABLET | Freq: Every day | ORAL | 1 refills | Status: DC

## 2020-08-28 MED ORDER — PEGFILGRASTIM-CBQV 6 MG/0.6ML ~~LOC~~ SOSY
6.0000 mg | PREFILLED_SYRINGE | Freq: Once | SUBCUTANEOUS | Status: AC
  Administered 2020-08-28: 6 mg via SUBCUTANEOUS

## 2020-08-28 NOTE — Telephone Encounter (Signed)
Per secure chat from Dr. Mike Gip: I was reviewing her labs from her hospitalization.  B12 and folate were both low.  Ensure she is taking B12 1000 mcg a day and folate 1 mg a day.

## 2020-08-28 NOTE — Telephone Encounter (Signed)
Patient got the OTC vitamin B12 1000 mcg today.  Pharmacy told them Folate 1 mg is not sold OTC and is available by prescription only.  Do you want to send rx?

## 2020-08-28 NOTE — Telephone Encounter (Signed)
  Folate Rx sent in.  M

## 2020-08-28 NOTE — Telephone Encounter (Signed)
Telephone call to patient for follow up after receiving first infusion.   No answer but left message stating we were calling to check on them.  Encouraged patient to call for any questions or concerns.   

## 2020-08-28 NOTE — Progress Notes (Signed)
Tumor Board Documentation  Natasha Farrell was presented by Dr Mike Gip at our Tumor Board on 08/28/2020, which included representatives from medical oncology,radiation oncology,internal medicine,navigation,pathology,radiology,pharmacy,genetics,research,palliative care.  Natasha Farrell currently presents as a new patient,for MDC,for new positive pathology with history of the following treatments: surgical intervention(s),active survellience.  Additionally, we reviewed previous medical and familial history, history of present illness, and recent lab results along with all available histopathologic and imaging studies. The tumor board considered available treatment options and made the following recommendations: Immunotherapy,Chemotherapy    The following procedures/referrals were also placed: No orders of the defined types were placed in this encounter.   Clinical Trial Status: not discussed   Staging used: AJCC Stage Group  AJCC Staging:       Group: Follicular Grade II Lymphoma   National site-specific guidelines NCCN were discussed with respect to the case.  Tumor board is a meeting of clinicians from various specialty areas who evaluate and discuss patients for whom a multidisciplinary approach is being considered. Final determinations in the plan of care are those of the provider(s). The responsibility for follow up of recommendations given during tumor board is that of the provider.   Today's extended care, comprehensive team conference, Natasha Farrell was not present for the discussion and was not examined.   Multidisciplinary Tumor Board is a multidisciplinary case peer review process.  Decisions discussed in the Multidisciplinary Tumor Board reflect the opinions of the specialists present at the conference without having examined the patient.  Ultimately, treatment and diagnostic decisions rest with the primary provider(s) and the patient.

## 2020-08-28 NOTE — Telephone Encounter (Signed)
Pt informed

## 2020-08-28 NOTE — Progress Notes (Signed)
[facility]  [address], [address] [place], [place] [address] Phone: [phone]  Fax: [phone]   Clinic Day:  09/01/2020  Referring physician: [name], MD  Chief Complaint: TAMERRA Farrell is a 75 y.o. female with grade II stage IVB follicular lymphoma who is seen for assessment on day 6 s/p cycle #1 RCHOP.  HPI:  The patient was last seen in the medical oncology clinic on 08/27/2020. At that time, she was fatigued.  Exam revealed diffuse adenopathy. Hematocrit was 25.3, hemoglobin 8.3, MCV 94.8, platelets 72,000, WBC 12,500. Sodium was 129. Uric acid was 4.1. LDH was 212.   She received cycle #1 RCHOP.  During her Rituxan infusion, she developed shortness of breath.  She received Benadryl and Solumedrol.  She received Udencya on 08/28/2020.  Labs on 08/28/2020 included a sodium of 130, potassium 4.4, creatinine 0.99, uric acid 3.9.  Hemoglobin was 8.8.    During the interim, she has been doing well. She is just a bit weak and tired. Her appetite is good and she is drinking a lot of fluids. She has some soreness on the left side of her neck. She reports that she cannot feel her lymph nodes anymore.  Her cough is getting worse, especially at night. She falls asleep in a bed but has to move to a recliner halfway through the night. She had a headache yesterday from coughing. She sometimes had twinges in her back and sides from coughing.  This morning was her last day of steroids.  She would like a PRBC transfusion tomorrow.   Past Medical History:  Diagnosis Date  . Hx of hysterectomy   . Hyperlipidemia   . Hypertension   . Thyroid disease    rt thryroidectomy  09/25/2010    Past Surgical History:  Procedure Laterality Date  . BREAST BIOPSY Right 04/21/2020   hydromark 3 [place] bx path pending  . LYMPH NODE BIOPSY Left 08/01/2020   Procedure: LYMPH NODE BIOPSY;  Surgeon: Robert Bellow, MD;  Location: [facility] ORS;  Service: General;   Laterality: Left;  . PORTACATH PLACEMENT Left 08/22/2020   Procedure: INSERTION PORT-A-CATH;  Surgeon: Robert Bellow, MD;  Location: [facility] ORS;  Service: General;  Laterality: Left;    Family History  Problem Relation Age of Onset  . Prostate cancer Father   . Breast cancer Sister 5  . Colon cancer Sister   . Kidney cancer Sister     Social History:  reports that she has never smoked. She has never used smokeless tobacco. She reports that she does not drink alcohol and does not use drugs. She does not use alcohol or drugs. She denies exposure to radiation or toxins. She is a retired Librarian, academic. Her husband's name is Natasha Farrell. The patient is accompanied by Natasha Farrell today.  Allergies:  Allergies  Allergen Reactions  . Lisinopril Swelling    angioedema    Current Medications: Current Outpatient Medications  Medication Sig Dispense Refill  . acetaminophen (TYLENOL) 500 MG tablet Take 500 mg by mouth every 6 (six) hours as needed for moderate pain, fever or headache.    . allopurinol (ZYLOPRIM) 300 MG tablet Take 1 tablet (300 mg total) by mouth daily. 30 tablet 2  . carvedilol (COREG) 25 MG tablet Take 1 tablet (25 mg total) by mouth 2 (two) times daily. 180 tablet 3  . cholecalciferol (VITAMIN D3) 25 MCG (1000 UNIT) tablet Take 2,000 Units by mouth daily.    . cyanocobalamin 1000 MCG  tablet Take 1,000 mcg by mouth daily.    . feeding supplement (ENSURE ENLIVE / ENSURE PLUS) LIQD Take 237 mLs by mouth 2 (two) times daily between meals. 962 mL 12  . folic acid (FOLVITE) 1 MG tablet Take 1 tablet (1 mg total) by mouth daily. 30 tablet 1  . levothyroxine (SYNTHROID) 50 MCG tablet Take 50 mcg by mouth daily before breakfast.    . lidocaine-prilocaine (EMLA) cream Apply 1 application topically as needed. Apply small amount to port site at least 1 hour prior to it being accessed, cover with plastic wrap 30 g 1  . loratadine (CLARITIN) 10 MG tablet Take 10 mg by mouth daily as needed for allergies.     . Multiple Vitamin (MULTIVITAMIN WITH MINERALS) TABS tablet Take 1 tablet by mouth daily.    Marland Kitchen omeprazole (PRILOSEC) 20 MG capsule Take 1 capsule (20 mg total) by mouth daily. 30 capsule 3  . ondansetron (ZOFRAN) 8 MG tablet Take 1 tablet (8 mg total) by mouth every 8 (eight) hours as needed for nausea or vomiting. 20 tablet 3  . pravastatin (PRAVACHOL) 40 MG tablet Take 40 mg by mouth daily.    . predniSONE (DELTASONE) 20 MG tablet Take 5 tablets (100 mg total) by mouth daily. Take with food on days 1-5 of chemotherapy. 25 tablet 5  . prochlorperazine (COMPAZINE) 10 MG tablet Take 1 tablet (10 mg total) by mouth every 6 (six) hours as needed (Nausea or vomiting). 30 tablet 6  . sodium chloride 1 g tablet Take 1 tablet (1 g total) by mouth 2 (two) times daily with a meal. 60 tablet 0  . spironolactone (ALDACTONE) 50 MG tablet Take 0.5 tablet (25 mg) by mouth once daily      No current facility-administered medications for this visit.    Review of Systems  Constitutional: Positive for malaise/fatigue and weight loss (2 lbs). Negative for chills, diaphoresis and fever.  HENT: Negative for congestion, ear discharge, ear pain, hearing loss, nosebleeds, sinus pain, sore throat and tinnitus.   Eyes: Negative for blurred vision.  Respiratory: Positive for cough (non productive, worsening). Negative for hemoptysis, sputum production and shortness of breath.   Cardiovascular: Positive for orthopnea (sleeps in recliner). Negative for chest pain, palpitations and leg swelling.  Gastrointestinal: Negative for abdominal pain, blood in stool, constipation, diarrhea, heartburn, melena, nausea and vomiting.       Good appetite.  Genitourinary: Negative for dysuria, frequency, hematuria and urgency.  Musculoskeletal: Negative for back pain, joint pain, myalgias and neck pain.       Occasional "twinges" in her back and sides. Left neck soreness.  Skin: Negative for itching and rash.  Neurological: Positive  for weakness (generalized) and headaches (yesterday). Negative for dizziness, tingling and sensory change.  Endo/Heme/Allergies: Does not bruise/bleed easily.  Psychiatric/Behavioral: Negative for depression and memory loss. The patient is not nervous/anxious and does not have insomnia.   All other systems reviewed and are negative.  Performance status (ECOG): 1  Vitals Blood pressure (!) 157/60, pulse (!) 101, temperature 98.2 F (36.8 C), temperature source Oral, resp. rate 16, weight 164 lb 12.7 oz (74.7 kg), SpO2 98 %.   Physical Exam Vitals and nursing note reviewed.  Constitutional:      General: She is not in acute distress.    Appearance: She is not diaphoretic.  HENT:     Head: Normocephalic and atraumatic.     Mouth/Throat:     Mouth: Mucous membranes are moist.  Pharynx: Oropharynx is clear.  Eyes:     General: No scleral icterus.    Extraocular Movements: Extraocular movements intact.     Conjunctiva/sclera: Conjunctivae normal.     Pupils: Pupils are equal, round, and reactive to light.     Comments: Glasses.  Brown eyes.  Cardiovascular:     Rate and Rhythm: Normal rate and regular rhythm.     Heart sounds: Normal heart sounds. No murmur heard.   Pulmonary:     Effort: Pulmonary effort is normal. No respiratory distress.     Breath sounds: No wheezing or rales.     Comments: Fluid at least 1/3 of the way up the left lung. Tiny amount of fluid at the right side. Chest:     Chest wall: No tenderness.  Breasts:     Right: Axillary adenopathy (barely palpable) present. No supraclavicular adenopathy.     Left: No axillary adenopathy or supraclavicular adenopathy.    Abdominal:     General: Bowel sounds are normal. There is no distension.     Palpations: Abdomen is soft. There is hepatomegaly (3 fingerbreadths below right costal margin) and splenomegaly (2 fingerbreadths below left costal margin.). There is no mass.     Tenderness: There is no abdominal  tenderness. There is no guarding or rebound.  Musculoskeletal:        General: No swelling or tenderness. Normal range of motion.     Cervical back: Normal range of motion and neck supple.     Right lower leg: No edema.     Left lower leg: No edema.  Lymphadenopathy:     Head:     Right side of head: No submandibular, preauricular, posterior auricular or occipital adenopathy.     Left side of head: No submandibular, preauricular, posterior auricular or occipital adenopathy.     Cervical: Cervical adenopathy (barely palpable) present.     Upper Body:     Right upper body: Axillary adenopathy (barely palpable) present. No supraclavicular adenopathy.     Left upper body: No supraclavicular or axillary adenopathy.     Lower Body: No right inguinal adenopathy. No left inguinal adenopathy.  Skin:    General: Skin is warm and dry.  Neurological:     Mental Status: She is alert and oriented to person, place, and time.  Psychiatric:        Behavior: Behavior normal.        Thought Content: Thought content normal.        Judgment: Judgment normal.    Appointment on 09/01/2020  Component Date Value Ref Range Status  . Uric Acid, Serum 09/01/2020 3.3  2.5 - 7.1 mg/dL Final   Performed at [facility], [address] [place], [place] [address]  . Sodium 09/01/2020 131* 135 - 145 mmol/L Final  . Potassium 09/01/2020 4.5  3.5 - 5.1 mmol/L Final  . Chloride 09/01/2020 97* 98 - 111 mmol/L Final  . CO2 09/01/2020 28  22 - 32 mmol/L Final  . Glucose, Bld 09/01/2020 147* 70 - 99 mg/dL Final   Glucose reference range applies only to samples taken after fasting for at least 8 hours.  . BUN 09/01/2020 17  8 - 23 mg/dL Final  . Creatinine, Ser 09/01/2020 0.52  0.44 - 1.00 mg/dL Final  . Calcium 09/01/2020 9.4  8.9 - 10.3 mg/dL Final  . GFR, Estimated 09/01/2020 >60  >60 mL/min Final   Comment: (NOTE) Calculated using the CKD-EPI Creatinine Equation (2021)   .  Anion gap 09/01/2020 6  5  - 15 Final   Performed at [facility], [address], [place], [place] [address]  . WBC 09/01/2020 15.4* 4.0 - 10.5 K/uL Final  . RBC 09/01/2020 2.33* 3.87 - 5.11 MIL/uL Final  . Hemoglobin 09/01/2020 7.5* 12.0 - 15.0 g/dL Final  . HCT 09/01/2020 21.9* 36.0 - 46.0 % Final  . MCV 09/01/2020 94.0  80.0 - 100.0 fL Final  . MCH 09/01/2020 32.2  26.0 - 34.0 pg Final  . MCHC 09/01/2020 34.2  30.0 - 36.0 g/dL Final  . RDW 09/01/2020 20.2* 11.5 - 15.5 % Final  . Platelets 09/01/2020 28* 150 - 400 K/uL Final   Comment: Immature Platelet Fraction may be clinically indicated, consider ordering this additional test [id number] THIS CRITICAL RESULT HAS VERIFIED AND BEEN CALLED TO ONIKA SLADE BY SANDRA FAYE WILLIAMSON ON 04 11 2022 AT 3664, AND HAS BEEN READ BACK.    Marland Kitchen nRBC 09/01/2020 0.0  0.0 - 0.2 % Final   Performed at [facility], [address] [place], [place] [address]    Assessment:  Natasha Farrell is a 74 y.o. female with grade II stage IVB follicular lymphoma s/p excisional biopsy on 08/01/2020.  She presented with extensive adenopathy and massive splenomegaly.  Right axillary ultrasound guided biopsy on 04/21/2020 revealed fragments of lymph node with reactive follicular hyperplasia. There was no definite evidence of malignancy. Flow cytometry revealed a CD10+ B cell population. Clonality could not be evaluated due to non-specific light chain binding. There was no evidence of a T cell lymphoproliferative disorder.  Chest CT angiogram on 07/21/2020 revealed extensive lymphadenopathy throughout the neck, chest, and visualized portions of the abdomen, along with marked splenomegaly.  Largest right axillary lymph node was 2.1 x 1.6 cm and left axillary lymph node 3.0 x 1.5 cm.  There were enlarged lymph nodes interposed between the brachiocephalic vein and left subclavian measuring 2.1 x 1.6 cm.  Overall, findings favored leukemia or lymphoma. There was no evidence of  pulmonary embolus. There was a small left pleural effusion.   Work-up on 07/28/2020 revealed a hematocrit of 36.4, hemoglobin 12.3, platelets 63,000, WBC 8,500 ([place] 5200).  Calcium was 10.1 with an albumin of 4.5. Total protein was 9.0 (6.5-8.1). G6PD assay was 7.7 (normal). Normal studies included: hepatitis B surface antigen, hepatitis B core antibody, hepatitis C antibody, and HIV antibody.  LDH was 182. Uric acid was 9.3 (2.5-7.1); she began allopurinol.  Bone marrow on 08/04/2020 revealed variably cellular bone marrow with trilineage hematopoiesis and involvement by an atypical lymphoid proliferation  The bone marrow core biopsy demonstrated involvement by an atypical lymphoid proliferation with paratrabecular lymphoid aggregates and  patchy infiltration of the bone marrow space with accompanying fibrosis. The infiltrate was composed of mixed B-cells and T-cells. At least a subset of the B-cells co-expressed CD10 and BCL6 without definitive BCL2 expression. Scattered larger CD20-positive larger B-cells were also present. Flow cytometry showed no definitive atypical B-cell population. No immunophenotypically aberrant T-cell population was identified. There were no increase in blasts. Monocytes showed heterogeneous CD56 expression.  Cytogenetics were normal (66, XX).  PET scan on 08/12/2020 revealed widespread adenopathy, visceral involvement and signs of near diffuse, multifocal bony involvement, distribution most c/w lymphoma, Deauville category 5. There was ileal involvement as well. Peritoneal involvement was suggested as well with nodular changes about the peritoneum but without significant FDG uptake. There was paraspinal soft tissue without frank bony destruction. Scattered areas of sclerosis underestimated  the degree of bony involvement. Abdominal ascites may be related to anasarca and or lymphatic congestion with marked enlargement of LEFT-sided pleural effusion and mild enlargement of small  RIGHT-sided pleural fluid.  Thoracentesis is scheduled for 08/15/2020.  Echo on 07/24/2020 revealed an EF of 60-65%.  She has had a recurrent left sided pleural effusion.  She underwent thoracentesis on 08/15/2020 (1.6 liters) and 08/23/2020 (1.5 liters).  She is day 6 s/p cycle #1 RCHOP (08/27/2020) with Udencya support.  She has a family history of malignancy.  Her father died from prostate cancer. Her sister had breast cancer. Another sister had colon cancer and kidney cancer.  She was admitted to [facility] from 08/15/2020 - 08/20/2020 for acute respiratory failure with hypoxia and sepsis. She was felt to have re-expansion pulmonary edema.   She was admitted to [facility] from 08/22/2020 - 08/24/2020. She had increased shortness of breath after her port placement. She underwent thoracentesis for recurrent left pleural effusion.  The patient received the COVID-19 vaccine in 06/2019 and 07/2019. The Moderna Booster was on 03/17/2020.   Symptomatically, she feels weak and tired.  She is sleeping in a recliner.  Hemoglobin is 7.5, platelets 28,000, and WBC 15,400 (s/p Udencya).  Plan: 1.   Labs today: CBC, BMP, uric acid, hold tube. 2.   Grade II Stage IVB follicular lymphoma  PET scan on 08/12/2020 revealed widespread adenopathy, visceral involvement and near diffuse, multifocal bony involvement.   Clinically and PET SUV data suggest possible transformation.  Bone marrow on 08/04/2020 revealed involvement by an atypical lymphoid proliferation.                         Flow cytometry was negative.            Plan: RCHOP x 6 cycles with Udencya support.             She is day 6 s/p cycle #1 RCHOP chemotherapy.   No evidence of tumor lysis.   WBC elevated secondary to [place].   Platelet count adequate (maintain > 20,000 secondary to hemorrhagic left pleural effusion).   She has symptomatic anemia.    Discuss plan for transfusion in AM.  Continue to monitor frequently- patient to be seen before  upcoming Easter weekend.  Discuss symptom management.  She has antiemetics at home to use on a prn bases.  Interventions are adequate.    3.   Thrombocytopenia  Platelet count is 28,000.  Etiology felt secondary to massive hepatosplenomegaly and chemotherapy.  Maintain platelets > 20,000 secondary to hemorrhagic left pleural effusion. 4.   Normocytic anemia  Hematocrit 21.9.  Hemoglobin 7.5.  MCV 94.0.  Ferritin 271 with an iron sat 25% and a TIBC 330 on 08/20/2020.  Folate 5.1 (low) and B12 206 (low) on 08/21/2018.   Patient on B12 and folate supplementation.  Transfuse PRBCs tomorrow. 5.   Recurrent left pleural effusion  Patient has undergone thoracentesis x 2.  Patient to contact clinic if worsening shortness of breath. 6.   RTC tomorrow for labs (CBC) and 1 unit irradiated PRBCs. 7.   RTC daily this week for labs (CBC) and +/- PRBCs and platelets. 8.   RTC on Thursday 8 AM for MD assessment and labs (CBC with diff, BMP, uric acid hold tube).  9.   RTC on 09/09/2020 for MD assessment and labs (CBC with diff, BMP, hold tube). 10.   RTC on 09/17/2020 for MD assessment, labs (CBC with diff, CMP,  Mg, uric acid), and cycle #2 RCHOP chemotherapy.  I discussed the assessment and treatment plan with the patient.  The patient was provided an opportunity to ask questions and all were answered.  The patient agreed with the plan and demonstrated an understanding of the instructions.  The patient was advised to call back if the symptoms worsen or if the condition fails to improve as anticipated.   Jule Whitsel C. Mike Gip, MD, PhD    09/01/2020, 11:51 AM  I, Mirian Mo Tufford, am acting as Education administrator for [employer]. Mike Gip, MD, PhD.  I, Koni Kannan C. Mike Gip, MD, have reviewed the above documentation for accuracy and completeness, and I agree with the above.

## 2020-08-29 ENCOUNTER — Encounter: Payer: Self-pay | Admitting: Medical

## 2020-08-29 ENCOUNTER — Ambulatory Visit: Payer: Medicare HMO | Admitting: Medical

## 2020-08-29 ENCOUNTER — Telehealth: Payer: Self-pay | Admitting: Medical

## 2020-08-29 ENCOUNTER — Other Ambulatory Visit: Payer: Self-pay

## 2020-08-29 VITALS — BP 140/68 | HR 96 | Ht 64.0 in | Wt 168.0 lb

## 2020-08-29 DIAGNOSIS — I10 Essential (primary) hypertension: Secondary | ICD-10-CM | POA: Diagnosis not present

## 2020-08-29 DIAGNOSIS — R000 Tachycardia, unspecified: Secondary | ICD-10-CM | POA: Diagnosis not present

## 2020-08-29 DIAGNOSIS — R0602 Shortness of breath: Secondary | ICD-10-CM

## 2020-08-29 DIAGNOSIS — E871 Hypo-osmolality and hyponatremia: Secondary | ICD-10-CM

## 2020-08-29 NOTE — Telephone Encounter (Signed)
This RN was notified by Tarri Glenn, PA after the patient's visit today that due to hyponatremia on her labs yesterday she will need to decrease spironolactone to a total of 25 mg once daily.  I have called and notified the patient of the above recommendations. She voices understanding and is agreeable.  She states she has a lot of 50 mg tablets of spironolactone at home. I have advised her she can take spironolactone 50 mg- 0.5 tablet (25 mg) by mouth once daily.  The patient is agreeable.   Updated AVS mailed to the patient with med changes.

## 2020-08-29 NOTE — Telephone Encounter (Signed)
Mr. Remmel notified.

## 2020-08-29 NOTE — Patient Instructions (Addendum)
Medication Instructions:  Your physician has recommended you make the following change in your medication:   1) DECREASE spironolactone 50 mg- take 0.5 tablet (25 mg) by mouth once daily    *If you need a refill on your cardiac medications before your next appointment, please call your pharmacy*   Lab Work: None ordered If you have labs (blood work) drawn today and your tests are completely normal, you will receive your results only by: Marland Kitchen MyChart Message (if you have MyChart) OR . A paper copy in the mail If you have any lab test that is abnormal or we need to change your treatment, we will call you to review the results.   Testing/Procedures: None ordered   Follow-Up: At [facility], you and your health needs are our priority.  As part of our continuing mission to provide you with exceptional heart care, we have created designated Provider Care Teams.  These Care Teams include your primary Cardiologist (physician) and Advanced Practice Providers (APPs -  Physician Assistants and Nurse Practitioners) who all work together to provide you with the care you need, when you need it.  We recommend signing up for the patient portal called "MyChart".  Sign up information is provided on this After Visit Summary.  MyChart is used to connect with patients for Virtual Visits (Telemedicine).  Patients are able to view lab/test results, encounter notes, upcoming appointments, etc.  Non-urgent messages can be sent to your provider as well.   To learn more about what you can do with MyChart, go to [email or web address].    Your next appointment:   4 week(s)  The format for your next appointment:   In Person  Provider:   You may see Nelva Bush, MD or one of the following Advanced Practice Providers on your designated Care Team:    Murray Hodgkins, NP  Christell Faith, PA-C  Marrianne Mood, PA-C  Cadence Kathlen Mody, [place]  Laurann Montana, NP    Other Instructions Your physician has  requested that you regularly monitor and record your blood pressure readings at home. Please use the same machine at the same time of day to check your readings and record them to bring to your follow-up visit.

## 2020-09-01 ENCOUNTER — Other Ambulatory Visit: Payer: Self-pay | Admitting: Hematology and Oncology

## 2020-09-01 ENCOUNTER — Inpatient Hospital Stay (HOSPITAL_BASED_OUTPATIENT_CLINIC_OR_DEPARTMENT_OTHER): Payer: Medicare HMO | Admitting: Hematology and Oncology

## 2020-09-01 ENCOUNTER — Other Ambulatory Visit: Payer: Self-pay

## 2020-09-01 ENCOUNTER — Other Ambulatory Visit: Payer: Self-pay | Admitting: *Deleted

## 2020-09-01 ENCOUNTER — Encounter: Payer: Self-pay | Admitting: Hematology and Oncology

## 2020-09-01 ENCOUNTER — Inpatient Hospital Stay: Payer: Medicare HMO

## 2020-09-01 VITALS — BP 157/60 | HR 101 | Temp 98.2°F | Resp 16 | Wt 164.8 lb

## 2020-09-01 DIAGNOSIS — D696 Thrombocytopenia, unspecified: Secondary | ICD-10-CM | POA: Diagnosis not present

## 2020-09-01 DIAGNOSIS — J90 Pleural effusion, not elsewhere classified: Secondary | ICD-10-CM

## 2020-09-01 DIAGNOSIS — D649 Anemia, unspecified: Secondary | ICD-10-CM

## 2020-09-01 DIAGNOSIS — C8219 Follicular lymphoma grade II, extranodal and solid organ sites: Secondary | ICD-10-CM

## 2020-09-01 DIAGNOSIS — R059 Cough, unspecified: Secondary | ICD-10-CM | POA: Diagnosis not present

## 2020-09-01 DIAGNOSIS — Z5112 Encounter for antineoplastic immunotherapy: Secondary | ICD-10-CM | POA: Diagnosis not present

## 2020-09-01 LAB — BASIC METABOLIC PANEL
Anion gap: 6 (ref 5–15)
BUN: 17 mg/dL (ref 8–23)
CO2: 28 mmol/L (ref 22–32)
Calcium: 9.4 mg/dL (ref 8.9–10.3)
Chloride: 97 mmol/L — ABNORMAL LOW (ref 98–111)
Creatinine, Ser: 0.52 mg/dL (ref 0.44–1.00)
GFR, Estimated: >60 mL/min (ref >60)
Glucose, Bld: 147 mg/dL — ABNORMAL HIGH (ref 70–99)
Potassium: 4.5 mmol/L (ref 3.5–5.1)
Sodium: 131 mmol/L — ABNORMAL LOW (ref 135–145)

## 2020-09-01 LAB — CBC
HCT: 21.9 % — ABNORMAL LOW (ref 36.0–46.0)
Hemoglobin: 7.5 g/dL — ABNORMAL LOW (ref 12.0–15.0)
MCH: 32.2 pg (ref 26.0–34.0)
MCHC: 34.2 g/dL (ref 30.0–36.0)
MCV: 94 fL (ref 80.0–100.0)
Platelets: 28 10*3/uL — CL (ref 150–400)
RBC: 2.33 MIL/uL — ABNORMAL LOW (ref 3.87–5.11)
RDW: 20.2 % — ABNORMAL HIGH (ref 11.5–15.5)
WBC: 15.4 10*3/uL — ABNORMAL HIGH (ref 4.0–10.5)
nRBC: 0 % (ref 0.0–0.2)

## 2020-09-01 LAB — SAMPLE TO BLOOD BANK

## 2020-09-01 LAB — URIC ACID: Uric Acid, Serum: 3.3 mg/dL (ref 2.5–7.1)

## 2020-09-01 MED ORDER — BENZONATATE 100 MG PO CAPS: 100.0000 mg | ORAL_CAPSULE | Freq: Three times a day (TID) | ORAL | 0 refills | Status: DC | PRN

## 2020-09-01 NOTE — Progress Notes (Signed)
Patient here for follow up she reports that her cough is getting worse especially at night she also is having trouble getting allopurinol medication from pharmacy.

## 2020-09-01 NOTE — Progress Notes (Signed)
Lab Called CRITICAL VALUE STICKER  CRITICAL VALUE: platelet 28   DATE & TIME NOTIFIED: 09/01/20 @ 1130  MESSENGER (representative from lab): Lujean Rave  MD NOTIFIED: Mike Gip   TIME OF NOTIFICATION: 1132  RESPONSE: Still awaiting

## 2020-09-02 ENCOUNTER — Other Ambulatory Visit: Payer: Self-pay

## 2020-09-02 ENCOUNTER — Other Ambulatory Visit: Payer: Self-pay | Admitting: Hematology and Oncology

## 2020-09-02 ENCOUNTER — Telehealth: Payer: Self-pay

## 2020-09-02 ENCOUNTER — Inpatient Hospital Stay: Payer: Medicare HMO

## 2020-09-02 DIAGNOSIS — C8219 Follicular lymphoma grade II, extranodal and solid organ sites: Secondary | ICD-10-CM

## 2020-09-02 DIAGNOSIS — D649 Anemia, unspecified: Secondary | ICD-10-CM

## 2020-09-02 DIAGNOSIS — Z5112 Encounter for antineoplastic immunotherapy: Secondary | ICD-10-CM | POA: Diagnosis not present

## 2020-09-02 LAB — CBC
HCT: 19.9 % — ABNORMAL LOW (ref 36.0–46.0)
Hemoglobin: 6.7 g/dL — ABNORMAL LOW (ref 12.0–15.0)
MCH: 31.3 pg (ref 26.0–34.0)
MCHC: 33.7 g/dL (ref 30.0–36.0)
MCV: 93 fL (ref 80.0–100.0)
Platelets: 26 10*3/uL — CL (ref 150–400)
RBC: 2.14 MIL/uL — ABNORMAL LOW (ref 3.87–5.11)
RDW: 19.6 % — ABNORMAL HIGH (ref 11.5–15.5)
WBC: 3.3 10*3/uL — ABNORMAL LOW (ref 4.0–10.5)
nRBC: 0 % (ref 0.0–0.2)

## 2020-09-02 LAB — PREPARE RBC (CROSSMATCH)

## 2020-09-02 MED ORDER — HEPARIN SOD (PORK) LOCK FLUSH 100 UNIT/ML IV SOLN
500.0000 [IU] | Freq: Every day | INTRAVENOUS | Status: AC | PRN
Start: 2020-09-02 — End: 2020-09-02
  Administered 2020-09-02: 500 [IU]
  Filled 2020-09-02: qty 5

## 2020-09-02 MED ORDER — SODIUM CHLORIDE 0.9% IV SOLUTION
250.0000 mL | Freq: Once | INTRAVENOUS | Status: AC
  Administered 2020-09-02: 250 mL via INTRAVENOUS
  Filled 2020-09-02: qty 250

## 2020-09-02 MED ORDER — ACETAMINOPHEN 325 MG PO TABS
650.0000 mg | ORAL_TABLET | Freq: Once | ORAL | Status: AC
  Administered 2020-09-02: 650 mg via ORAL
  Filled 2020-09-02: qty 2

## 2020-09-02 MED ORDER — DIPHENHYDRAMINE HCL 25 MG PO CAPS
25.0000 mg | ORAL_CAPSULE | Freq: Once | ORAL | Status: AC
  Administered 2020-09-02: 25 mg via ORAL
  Filled 2020-09-02: qty 1

## 2020-09-02 MED ORDER — SODIUM CHLORIDE 0.9% FLUSH
3.0000 mL | INTRAVENOUS | Status: DC | PRN
Start: 2020-09-02 — End: 2020-09-02
  Filled 2020-09-02: qty 3

## 2020-09-02 NOTE — Telephone Encounter (Signed)
Received call from main lab for critical platelet count of 26 and hemoglobin of 6.7. Sent secure chat to Dr. Mike Gip in regards.

## 2020-09-03 ENCOUNTER — Inpatient Hospital Stay: Payer: Medicare HMO | Admitting: Oncology

## 2020-09-03 ENCOUNTER — Other Ambulatory Visit: Payer: Self-pay | Admitting: Hematology and Oncology

## 2020-09-03 ENCOUNTER — Inpatient Hospital Stay: Payer: Medicare HMO

## 2020-09-03 ENCOUNTER — Other Ambulatory Visit: Payer: Self-pay

## 2020-09-03 DIAGNOSIS — C8219 Follicular lymphoma grade II, extranodal and solid organ sites: Secondary | ICD-10-CM

## 2020-09-03 DIAGNOSIS — D696 Thrombocytopenia, unspecified: Secondary | ICD-10-CM

## 2020-09-03 DIAGNOSIS — M545 Low back pain, unspecified: Secondary | ICD-10-CM

## 2020-09-03 DIAGNOSIS — Z5112 Encounter for antineoplastic immunotherapy: Secondary | ICD-10-CM | POA: Diagnosis not present

## 2020-09-03 LAB — BPAM RBC
Blood Product Expiration Date: 202204272359
Blood Product Expiration Date: 202205032359
ISSUE DATE / TIME: 202204121045
ISSUE DATE / TIME: 202204121422
Unit Type and Rh: 5100
Unit Type and Rh: 9500

## 2020-09-03 LAB — CBC
HCT: 28.3 % — ABNORMAL LOW (ref 36.0–46.0)
Hemoglobin: 10.1 g/dL — ABNORMAL LOW (ref 12.0–15.0)
MCH: 31 pg (ref 26.0–34.0)
MCHC: 35.7 g/dL (ref 30.0–36.0)
MCV: 86.8 fL (ref 80.0–100.0)
Platelets: 19 10*3/uL — CL (ref 150–400)
RBC: 3.26 MIL/uL — ABNORMAL LOW (ref 3.87–5.11)
RDW: 18.6 % — ABNORMAL HIGH (ref 11.5–15.5)
WBC: 1.7 10*3/uL — ABNORMAL LOW (ref 4.0–10.5)
nRBC: 0 % (ref 0.0–0.2)

## 2020-09-03 LAB — TYPE AND SCREEN
ABO/RH(D): AB POS
Antibody Screen: NEGATIVE
Unit division: 0
Unit division: 0

## 2020-09-03 LAB — URINALYSIS, COMPLETE (UACMP) WITH MICROSCOPIC
Bilirubin Urine: NEGATIVE
Glucose, UA: NEGATIVE mg/dL
Hgb urine dipstick: NEGATIVE
Ketones, ur: NEGATIVE mg/dL
Leukocytes,Ua: NEGATIVE
Nitrite: NEGATIVE
Protein, ur: NEGATIVE mg/dL
Specific Gravity, Urine: 1.015 (ref 1.005–1.030)
pH: 7.5 (ref 5.0–8.0)

## 2020-09-03 LAB — PLATELET COUNT: Platelets: 35 10*3/uL — ABNORMAL LOW (ref 150–400)

## 2020-09-03 MED ORDER — HEPARIN SOD (PORK) LOCK FLUSH 100 UNIT/ML IV SOLN
500.0000 [IU] | Freq: Every day | INTRAVENOUS | Status: AC | PRN
Start: 2020-09-03 — End: 2020-09-03
  Administered 2020-09-03: 500 [IU]
  Filled 2020-09-03: qty 5

## 2020-09-03 MED ORDER — DIPHENHYDRAMINE HCL 25 MG PO CAPS
25.0000 mg | ORAL_CAPSULE | Freq: Once | ORAL | Status: AC
  Administered 2020-09-03: 25 mg via ORAL
  Filled 2020-09-03: qty 1

## 2020-09-03 MED ORDER — SODIUM CHLORIDE 0.9% FLUSH
3.0000 mL | INTRAVENOUS | Status: DC | PRN
  Filled 2020-09-03: qty 3

## 2020-09-03 MED ORDER — SODIUM CHLORIDE 0.9% IV SOLUTION
250.0000 mL | Freq: Once | INTRAVENOUS | Status: AC
  Administered 2020-09-03: 250 mL via INTRAVENOUS
  Filled 2020-09-03: qty 250

## 2020-09-03 MED ORDER — ACETAMINOPHEN 325 MG PO TABS
650.0000 mg | ORAL_TABLET | Freq: Once | ORAL | Status: AC
  Administered 2020-09-03: 650 mg via ORAL
  Filled 2020-09-03: qty 2

## 2020-09-03 NOTE — Progress Notes (Signed)
[facility]  [address], [address] [place], [place] [address] Phone: [phone]  Fax: [phone]   Clinic Day:  09/04/2020  Referring physician: [name], MD  Chief Complaint: Natasha Farrell is a 75 y.o. female with grade II stage IVB follicular lymphoma who is seen for assessment on day 9 s/p cycle #1 RCHOP.  HPI:  The patient was last seen in the medical oncology clinic on 09/01/2020. At that time, she was day 6 s/p cycle #1 RCHOP chemotherapy. Symptomatically, she felt a bit tired.  She noted a cough at night. Adenopathy had dramatically improved. Hematocrit was 21.9, hemoglobin 7.5, MCV 94.0, platelets 28,000, WBC 15,400. Sodium was 131. Uric acid was 3.3.   CBC has been followed daily: 09/02/2020:  Hematocrit 19.9, hemoglobin   6.7, MCV 93.0, platelets 26,000, WBC 3,300. She received 2 unit of PRBCs. 09/03/2020:  Hematocrit 28.3, hemoglobin 10.1, MCV 86.8, platelets 19,000, WBC 1,700. She received 1 unit of platelets.  During the interim, she has been "pretty good." She has more energy since she received PRBCs and platelets. She was able to walk from the parking lot into clinic today. She denies fevers and sweats. She cannot feel her lymph nodes anymore. Her appetite has improved and she is still drinking Ensure and Gatorade.  She is still having trouble with her cough. She still does not sleep through the night because she wakes up coughing. Tessalon Perles did not help. Her shortness of breath fluctuates and is worse in the evenings.  CXR today showed significantly enlarged left pleural effusion compared to prior exam. Images were reviewed with the patient. She would like to have a thoracentesis today or tomorrow.   Past Medical History:  Diagnosis Date  . Hx of hysterectomy   . Hyperlipidemia   . Hypertension   . Thyroid disease    rt thryroidectomy  09/25/2010    Past Surgical History:  Procedure Laterality Date  . BREAST BIOPSY  Right 04/21/2020   hydromark 3 [place] bx path pending  . LYMPH NODE BIOPSY Left 08/01/2020   Procedure: LYMPH NODE BIOPSY;  Surgeon: Robert Bellow, MD;  Location: [facility] ORS;  Service: General;  Laterality: Left;  . PORTACATH PLACEMENT Left 08/22/2020   Procedure: INSERTION PORT-A-CATH;  Surgeon: Robert Bellow, MD;  Location: [facility] ORS;  Service: General;  Laterality: Left;    Family History  Problem Relation Age of Onset  . Prostate cancer Father   . Breast cancer Sister 75  . Colon cancer Sister   . Kidney cancer Sister     Social History:  reports that she has never smoked. She has never used smokeless tobacco. She reports that she does not drink alcohol and does not use drugs. She does not use alcohol or drugs. She denies exposure to radiation or toxins. She is a retired Librarian, academic. Her husband's name is Natasha Farrell. The patient is accompanied by Natasha Farrell today.  Allergies:  Allergies  Allergen Reactions  . Lisinopril Swelling    angioedema    Current Medications: Current Outpatient Medications  Medication Sig Dispense Refill  . acetaminophen (TYLENOL) 500 MG tablet Take 500 mg by mouth every 6 (six) hours as needed for moderate pain, fever or headache.    . allopurinol (ZYLOPRIM) 300 MG tablet Take 1 tablet (300 mg total) by mouth daily. 30 tablet 2  . benzonatate (TESSALON) 100 MG capsule Take 1 capsule (100 mg total) by mouth 3 (three) times daily as needed for cough. 30 capsule  0  . carvedilol (COREG) 25 MG tablet Take 1 tablet (25 mg total) by mouth 2 (two) times daily. 180 tablet 3  . cholecalciferol (VITAMIN D3) 25 MCG (1000 UNIT) tablet Take 2,000 Units by mouth daily.    . cyanocobalamin 1000 MCG tablet Take 1,000 mcg by mouth daily.    . feeding supplement (ENSURE ENLIVE / ENSURE PLUS) LIQD Take 237 mLs by mouth 2 (two) times daily between meals. 400 mL 12  . folic acid (FOLVITE) 1 MG tablet Take 1 tablet (1 mg total) by mouth daily. 30 tablet 1  . levothyroxine (SYNTHROID) 50  MCG tablet Take 50 mcg by mouth daily before breakfast.    . lidocaine-prilocaine (EMLA) cream Apply 1 application topically as needed. Apply small amount to port site at least 1 hour prior to it being accessed, cover with plastic wrap 30 g 1  . loratadine (CLARITIN) 10 MG tablet Take 10 mg by mouth daily as needed for allergies.    . Multiple Vitamin (MULTIVITAMIN WITH MINERALS) TABS tablet Take 1 tablet by mouth daily.    Marland Kitchen omeprazole (PRILOSEC) 20 MG capsule Take 1 capsule (20 mg total) by mouth daily. 30 capsule 3  . ondansetron (ZOFRAN) 8 MG tablet Take 1 tablet (8 mg total) by mouth every 8 (eight) hours as needed for nausea or vomiting. 20 tablet 3  . pravastatin (PRAVACHOL) 40 MG tablet Take 40 mg by mouth daily.    . prochlorperazine (COMPAZINE) 10 MG tablet Take 1 tablet (10 mg total) by mouth every 6 (six) hours as needed (Nausea or vomiting). 30 tablet 6  . sodium chloride 1 g tablet Take 1 tablet (1 g total) by mouth 2 (two) times daily with a meal. 60 tablet 0  . spironolactone (ALDACTONE) 50 MG tablet Take 0.5 tablet (25 mg) by mouth once daily     . predniSONE (DELTASONE) 20 MG tablet Take 5 tablets (100 mg total) by mouth daily. Take with food on days 1-5 of chemotherapy. (Patient not taking: Reported on 09/04/2020) 25 tablet 5   No current facility-administered medications for this visit.    Review of Systems  Constitutional: Positive for weight loss (2 lbs). Negative for chills, diaphoresis, fever and malaise/fatigue.       Feels "pretty good."  HENT: Negative for congestion, ear discharge, ear pain, hearing loss, nosebleeds, sinus pain, sore throat and tinnitus.   Eyes: Negative for blurred vision.  Respiratory: Positive for cough (non productive) and shortness of breath (worse in evenings). Negative for hemoptysis and sputum production.   Cardiovascular: Positive for orthopnea (sleeps in recliner). Negative for chest pain, palpitations and leg swelling.  Gastrointestinal:  Negative for abdominal pain, blood in stool, constipation, diarrhea, heartburn, melena, nausea and vomiting.       Good appetite.  Genitourinary: Negative for dysuria, frequency, hematuria and urgency.  Musculoskeletal: Negative for back pain, joint pain, myalgias and neck pain.  Skin: Negative for itching and rash.  Neurological: Negative for dizziness, tingling, sensory change, weakness and headaches.  Endo/Heme/Allergies: Does not bruise/bleed easily.  Psychiatric/Behavioral: Negative for depression and memory loss. The patient has insomnia (due to cough). The patient is not nervous/anxious.   All other systems reviewed and are negative.  Performance status (ECOG): 1  Vitals Blood pressure (!) 141/74, pulse 89, temperature (!) 97.1 F (36.2 C), temperature source Tympanic, weight 162 lb 3.2 oz (73.6 kg), SpO2 99 %.   Physical Exam Vitals and nursing note reviewed.  Constitutional:  General: She is not in acute distress.    Appearance: She is not diaphoretic.  HENT:     Head: Normocephalic and atraumatic.     Mouth/Throat:     Mouth: Mucous membranes are moist.     Comments: Thrush. Soft palate petechiae. Eyes:     General: No scleral icterus.    Extraocular Movements: Extraocular movements intact.     Conjunctiva/sclera: Conjunctivae normal.     Pupils: Pupils are equal, round, and reactive to light.     Comments: Glasses.  Brown eyes.  Cardiovascular:     Rate and Rhythm: Normal rate and regular rhythm.     Heart sounds: Normal heart sounds. No murmur heard.   Pulmonary:     Effort: Pulmonary effort is normal. No respiratory distress.     Breath sounds: No wheezing or rales.     Comments: Decreased breath sounds 1/2 - 3/4 up on the left lung. Chest:     Chest wall: No tenderness.  Breasts:     Right: Axillary adenopathy (tiny) present. No supraclavicular adenopathy.     Left: No axillary adenopathy or supraclavicular adenopathy.    Abdominal:     General:  Bowel sounds are normal. There is no distension.     Palpations: Abdomen is soft. There is hepatomegaly and splenomegaly. There is no mass.     Tenderness: There is no abdominal tenderness. There is no guarding or rebound.     Comments: Decreasing hepatosplenomegaly.  Musculoskeletal:        General: No swelling or tenderness. Normal range of motion.     Cervical back: Normal range of motion and neck supple.     Right lower leg: No edema.     Left lower leg: No edema.  Lymphadenopathy:     Head:     Right side of head: No submandibular, preauricular, posterior auricular or occipital adenopathy.     Left side of head: No submandibular, preauricular, posterior auricular or occipital adenopathy.     Cervical: Cervical adenopathy (barely palpable) present.     Upper Body:     Right upper body: Axillary adenopathy (tiny) present. No supraclavicular adenopathy.     Left upper body: No supraclavicular or axillary adenopathy.     Lower Body: No right inguinal adenopathy. No left inguinal adenopathy.  Skin:    General: Skin is warm and dry.     Comments: Petechiae underneath bra line on right side of back. Scattered petechiae on arms.  No lower extremity petechiae.  Neurological:     Mental Status: She is alert and oriented to person, place, and time.  Psychiatric:        Behavior: Behavior normal.        Thought Content: Thought content normal.        Judgment: Judgment normal.    Infusion on 09/04/2020  Component Date Value Ref Range Status  . Sodium 09/04/2020 129* 135 - 145 mmol/L Final  . Potassium 09/04/2020 3.8  3.5 - 5.1 mmol/L Final  . Chloride 09/04/2020 95* 98 - 111 mmol/L Final  . CO2 09/04/2020 28  22 - 32 mmol/L Final  . Glucose, Bld 09/04/2020 101* 70 - 99 mg/dL Final   Glucose reference range applies only to samples taken after fasting for at least 8 hours.  . BUN 09/04/2020 9  8 - 23 mg/dL Final  . Creatinine, Ser 09/04/2020 0.54  0.44 - 1.00 mg/dL Final  . Calcium  09/04/2020 9.2  8.9 - 10.3 mg/dL  Final  . Total Protein 09/04/2020 6.5  6.5 - 8.1 g/dL Final  . Albumin 09/04/2020 3.9  3.5 - 5.0 g/dL Final  . AST 09/04/2020 22  15 - 41 U/L Final  . ALT 09/04/2020 16  0 - 44 U/L Final  . Alkaline Phosphatase 09/04/2020 76  38 - 126 U/L Final  . Total Bilirubin 09/04/2020 1.0  0.3 - 1.2 mg/dL Final  . GFR, Estimated 09/04/2020 >60  >60 mL/min Final   Comment: (NOTE) Calculated using the CKD-EPI Creatinine Equation (2021)   . Anion gap 09/04/2020 6  5 - 15 Final   Performed at [facility], [address] [place], [place] [address]  . Uric Acid, Serum 09/04/2020 3.2  2.5 - 7.1 mg/dL Final   Performed at [facility], [address] [place], [place] [address]  Infusion on 09/03/2020  Component Date Value Ref Range Status  . Unit Number 09/03/2020 [id number]   Final  . Blood Component Type 09/03/2020 PLTP1 PSORALEN TREATED   Final  . Unit division 09/03/2020 00   Final  . Status of Unit 09/03/2020 ISSUED,FINAL   Final  . Transfusion Status 09/03/2020    Final                   Value:OK TO TRANSFUSE Performed at [facility], [address] [place], [place] [address]   . ISSUE DATE / TIME 09/03/2020 [id number]   Final  . Blood Product Unit Number 09/03/2020 [id number]   Final  . PRODUCT CODE 09/03/2020 [id number]   Final  . Unit Type and Rh 09/03/2020 1700   Final  . Blood Product Expiration Date 09/03/2020 [id number]   Final  . Platelets 09/03/2020 35* 150 - 400 K/uL Final   Comment: Immature Platelet Fraction may be clinically indicated, consider ordering this additional test [id number] Performed at [facility], [address] [place], [place] [address]   Clinical Support on 09/03/2020  Component Date Value Ref Range Status  . WBC 09/03/2020 1.7* 4.0 - 10.5 K/uL Final  . RBC 09/03/2020 3.26* 3.87 - 5.11 MIL/uL Final  . Hemoglobin 09/03/2020 10.1* 12.0 - 15.0 g/dL Final   POST  TRANSFUSION SPECIMEN  . HCT 09/03/2020 28.3* 36.0 - 46.0 % Final  . MCV 09/03/2020 86.8  80.0 - 100.0 fL Final  . MCH 09/03/2020 31.0  26.0 - 34.0 pg Final  . MCHC 09/03/2020 35.7  30.0 - 36.0 g/dL Final  . RDW 09/03/2020 18.6* 11.5 - 15.5 % Final  . Platelets 09/03/2020 19* 150 - 400 K/uL Final   Comment: Immature Platelet Fraction may be clinically indicated, consider ordering this additional test [id number] THIS CRITICAL RESULT HAS VERIFIED AND BEEN CALLED TO TAYLOR WRENN BY CYNTHIA COX ON 04 13 2022 AT 0948, AND HAS BEEN READ BACK.    Marland Kitchen nRBC 09/03/2020 0.0  0.0 - 0.2 % Final   Performed at [facility], [address], [place], [place] [address]  . Color, Urine 09/03/2020 YELLOW  YELLOW Final  . APPearance 09/03/2020 CLEAR  CLEAR Final  . Specific Gravity, Urine 09/03/2020 1.015  1.005 - 1.030 Final  . pH 09/03/2020 7.5  5.0 - 8.0 Final  . Glucose, UA 09/03/2020 NEGATIVE  NEGATIVE mg/dL Final  . Hgb urine dipstick 09/03/2020 NEGATIVE  NEGATIVE Final  . Bilirubin Urine 09/03/2020 NEGATIVE  NEGATIVE Final  . Ketones, ur 09/03/2020 NEGATIVE  NEGATIVE mg/dL Final  . Protein, ur 09/03/2020 NEGATIVE  NEGATIVE mg/dL  Final  . Nitrite 09/03/2020 NEGATIVE  NEGATIVE Final  . Chalmers Guest 09/03/2020 NEGATIVE  NEGATIVE Final  . Squamous Epithelial / LPF 09/03/2020 6-10  0 - 5 Final  . WBC, UA 09/03/2020 0-5  0 - 5 WBC/hpf Final  . RBC / HPF 09/03/2020 0-5  0 - 5 RBC/hpf Final  . Bacteria, UA 09/03/2020 FEW* NONE SEEN Final   Performed at [facility], [address] [place], [place] [address]  Infusion on 09/02/2020  Component Date Value Ref Range Status  . Order Confirmation 09/02/2020    Final                   Value:ORDER PROCESSED BY BLOOD BANK Performed at [facility], [address], [place], [place] [address]   Appointment on 09/02/2020  Component Date Value Ref Range Status  . WBC 09/02/2020 3.3* 4.0 - 10.5 K/uL Final  . RBC 09/02/2020 2.14*  3.87 - 5.11 MIL/uL Final  . Hemoglobin 09/02/2020 6.7* 12.0 - 15.0 g/dL Final  . HCT 09/02/2020 19.9* 36.0 - 46.0 % Final  . MCV 09/02/2020 93.0  80.0 - 100.0 fL Final  . MCH 09/02/2020 31.3  26.0 - 34.0 pg Final  . MCHC 09/02/2020 33.7  30.0 - 36.0 g/dL Final  . RDW 09/02/2020 19.6* 11.5 - 15.5 % Final  . Platelets 09/02/2020 26* 150 - 400 K/uL Final   Comment: Immature Platelet Fraction may be clinically indicated, consider ordering this additional test [id number] THIS CRITICAL RESULT HAS VERIFIED AND BEEN CALLED TO TAYLOR WRENN BY CYNTHIA COX ON 04 12 2022 AT 0846, AND HAS BEEN READ BACK.    Marland Kitchen nRBC 09/02/2020 0.0  0.0 - 0.2 % Final   Performed at [facility], [address], [place], [place] [address]    Assessment:  SHATIA SINDONI is a 75 y.o. female with grade II stage IVB follicular lymphoma s/p excisional biopsy on 08/01/2020.  She presented with extensive adenopathy and massive splenomegaly.  Right axillary ultrasound guided biopsy on 04/21/2020 revealed fragments of lymph node with reactive follicular hyperplasia. There was no definite evidence of malignancy. Flow cytometry revealed a CD10+ B cell population. Clonality could not be evaluated due to non-specific light chain binding. There was no evidence of a T cell lymphoproliferative disorder.  Chest CT angiogram on 07/21/2020 revealed extensive lymphadenopathy throughout the neck, chest, and visualized portions of the abdomen, along with marked splenomegaly.  Largest right axillary lymph node was 2.1 x 1.6 cm and left axillary lymph node 3.0 x 1.5 cm.  There were enlarged lymph nodes interposed between the brachiocephalic vein and left subclavian measuring 2.1 x 1.6 cm.  Overall, findings favored leukemia or lymphoma. There was no evidence of pulmonary embolus. There was a small left pleural effusion.   Work-up on 07/28/2020 revealed a hematocrit of 36.4, hemoglobin 12.3, platelets 63,000, WBC 8,500 ([facility] 5200).  Calcium  was 10.1 with an albumin of 4.5. Total protein was 9.0 (6.5-8.1). G6PD assay was 7.7 (normal). Normal studies included: hepatitis B surface antigen, hepatitis B core antibody, hepatitis C antibody, and HIV antibody.  LDH was 182. Uric acid was 9.3 (2.5-7.1); she began allopurinol.  Bone marrow on 08/04/2020 revealed variably cellular bone marrow with trilineage hematopoiesis and involvement by an atypical lymphoid proliferation  The bone marrow core biopsy demonstrated involvement by an atypical lymphoid proliferation with paratrabecular lymphoid aggregates and  patchy infiltration of the bone marrow space with accompanying fibrosis. The infiltrate was composed of  mixed B-cells and T-cells. At least a subset of the B-cells co-expressed CD10 and BCL6 without definitive BCL2 expression. Scattered larger CD20-positive larger B-cells were also present. Flow cytometry showed no definitive atypical B-cell population. No immunophenotypically aberrant T-cell population was identified. There were no increase in blasts. Monocytes showed heterogeneous CD56 expression.  Cytogenetics were normal (42, XX).  PET scan on 08/12/2020 revealed widespread adenopathy, visceral involvement and signs of near diffuse, multifocal bony involvement, distribution most c/w lymphoma, Deauville category 5. There was ileal involvement as well. Peritoneal involvement was suggested as well with nodular changes about the peritoneum but without significant FDG uptake. There was paraspinal soft tissue without frank bony destruction. Scattered areas of sclerosis underestimated the degree of bony involvement. Abdominal ascites may be related to anasarca and or lymphatic congestion with marked enlargement of LEFT-sided pleural effusion and mild enlargement of small RIGHT-sided pleural fluid.  Thoracentesis is scheduled for 08/15/2020.  Echo on 07/24/2020 revealed an EF of 60-65%.  She has had a recurrent left sided pleural effusion.  She underwent  thoracentesis on 08/15/2020 (1.6 liters) and 08/23/2020 (1.5 liters).  She is day 9 s/p RCHOP (08/27/2020) with Udencya support.  She received 2 unit of PRBCs and 1 pheresed platelet transfusion with cycle #1.  She has a family history of malignancy.  Her father died from prostate cancer. Her sister had breast cancer. Another sister had colon cancer and kidney cancer.  She was admitted to [facility] from 08/15/2020 - 08/20/2020 for acute respiratory failure with hypoxia and sepsis. She was felt to have re-expansion pulmonary edema.   She was admitted to [facility] from 08/22/2020 - 08/24/2020. She had increased shortness of breath after her port placement. She underwent thoracentesis for recurrent left pleural effusion.  The patient received the COVID-19 vaccine in 06/2019 and 07/2019. The Moderna Booster was on 03/17/2020.   Symptomatically, she has a persistent cough and shortness of breath secondary to a recurrent left sided pleural effusion.  Plan: 1.   Labs today: CBC with diff, BMP, uric acid hold tube. 2.   Grade II stage IVB follicular lymphoma  PET scan on 08/12/2020 revealed widespread adenopathy, visceral involvement and near diffuse, multifocal bony involvement.   Clinically and SUV activity suggest possible transformation.  Bone marrow on 08/04/2020 revealed involvement by an atypical lymphoid proliferation.   Flow cytometry was negative.  Plan: RCHOP x 6 cycles with Udencya support.  She is day 9 s/p cycle #1 RCHOP chemotherapy.   Platelets are maintained > 20,000 secondary to recurrent hemorrhagic pleural effusion.   Blood products are leukopoor and irradiated.  Clinically, she is doing well with rapidly resolving bulky adenopathy and hepatosplenomegaly.   Pleural fluid has reaccumulated, but at a slower rate.  Discuss symptom management.  She has antiemetics at home to use on a prn bases.  Interventions are adequate.    3.   Thrombocytopenia  Platelet count is 26,000.  Etiology  felt secondary to massive hepatosplenomegaly and myelosuppression s/p chemotherapy.  Platelets are maintained > 20,000 secondary to recurrent hemorrhagic pleural effusion.   She received 1 unit of platelets on 09/03/2020.   Discuss plan for transfusion of platelets for thoracentesis.   Per interventional radiology, platelets need to be > 50,000.   Patient may need 2 units of platelets for thoracentesis in AM.   Check 1 hour post platelet count.  Monitor closely until platelets self sustaining. 4.   Normocytic anemia  Hematocrit 27.4.  Hemoglobin 9.5.  MCV 88.1.   Ferritin 271  with an iron sat 25% and a TIBC 330.   Folate 5.1 (low) and B12 206 (low) on 08/21/2018 on 08/20/2020.  She received 2 units of PRBCs on 09/02/2020 for a hemoglobin of 6.7.  Continue B12 and folate supplementation.  Continue to monitor. 5.   Leukopenia  WBC 2400 with an ANC of 600.  Continue neutropenic precautions. 6.   Recurrent left pleural effusion  Patient has undergone thoracentesis x 2 (08/15/2020 and 08/23/2020).  Maintain platelets > 20,000 secondary to hemorrhagic pleural fluid.  Platelets need to be 50,000 for thoracentesis. 7.   Hyponatremia  Sodium 129.  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  Patient on free water restriction.    Fluids with electrolytes. 8.   Thrush  Rx:  Nystatin swish and spit. 9.   CXR (PA and lateral)- f/u effusion. 10.   RTC daily x 4 for CBC with diff until counts are increasing (platelets self sustaining > 20,000). 11.   Thoracentesis tomorrow early AM with likely platelet transfusion prior to achieve radiology platelet goal. 12.   RTC on 09/09/2020 for MD assessment as previously scheduled.  I discussed the assessment and treatment plan with the patient.  The patient was provided an opportunity to ask questions and all were answered.  The patient agreed with the plan and demonstrated an understanding of the instructions.  The patient was advised to call back if the symptoms  worsen or if the condition fails to improve as anticipated.  I provided 24 minutes of face-to-face time during this this encounter and > 50% was spent counseling as documented under my assessment and plan.  An additional 12 minutes were spent reviewing her chart (Epic and Care Everywhere) including notes, labs, and imaging studies and coordinating care.   Faust Thorington C. Mike Gip, MD, PhD    09/04/2020, 10:14 AM  I, Mirian Mo Tufford, am acting as Education administrator for [employer]. Mike Gip, MD, PhD.  I, Chasidy Janak C. Mike Gip, MD, have reviewed the above documentation for accuracy and completeness, and I agree with the above.

## 2020-09-04 ENCOUNTER — Inpatient Hospital Stay: Payer: Medicare HMO

## 2020-09-04 ENCOUNTER — Telehealth: Payer: Self-pay

## 2020-09-04 ENCOUNTER — Encounter: Payer: Self-pay | Admitting: Hematology and Oncology

## 2020-09-04 ENCOUNTER — Inpatient Hospital Stay (HOSPITAL_BASED_OUTPATIENT_CLINIC_OR_DEPARTMENT_OTHER): Payer: Medicare HMO | Admitting: Hematology and Oncology

## 2020-09-04 ENCOUNTER — Ambulatory Visit
Admission: RE | Admit: 2020-09-04 | Discharge: 2020-09-04 | Disposition: A | Discharge status: Home / Self Care | Payer: Medicare HMO | Source: Ambulatory Visit | Attending: Hematology and Oncology | Admitting: Hematology and Oncology

## 2020-09-04 ENCOUNTER — Other Ambulatory Visit: Payer: Self-pay

## 2020-09-04 ENCOUNTER — Ambulatory Visit
Admission: RE | Admit: 2020-09-04 | Discharge: 2020-09-04 | Disposition: A | Discharge status: Home / Self Care | Payer: Medicare HMO | Attending: Hematology and Oncology | Admitting: Hematology and Oncology

## 2020-09-04 VITALS — BP 141/74 | HR 89 | Temp 97.1°F | Wt 162.2 lb

## 2020-09-04 DIAGNOSIS — C8219 Follicular lymphoma grade II, extranodal and solid organ sites: Secondary | ICD-10-CM

## 2020-09-04 DIAGNOSIS — J90 Pleural effusion, not elsewhere classified: Secondary | ICD-10-CM | POA: Insufficient documentation

## 2020-09-04 DIAGNOSIS — D696 Thrombocytopenia, unspecified: Secondary | ICD-10-CM | POA: Diagnosis not present

## 2020-09-04 DIAGNOSIS — D649 Anemia, unspecified: Secondary | ICD-10-CM

## 2020-09-04 DIAGNOSIS — Z5112 Encounter for antineoplastic immunotherapy: Secondary | ICD-10-CM | POA: Diagnosis not present

## 2020-09-04 DIAGNOSIS — B370 Candidal stomatitis: Secondary | ICD-10-CM

## 2020-09-04 LAB — CBC WITH DIFFERENTIAL/PLATELET
Abs Immature Granulocytes: 0.02 10*3/uL (ref 0.00–0.07)
Basophils Absolute: 0 10*3/uL (ref 0.0–0.1)
Basophils Relative: 1 %
Eosinophils Absolute: 0 10*3/uL (ref 0.0–0.5)
Eosinophils Relative: 1 %
HCT: 27.4 % — ABNORMAL LOW (ref 36.0–46.0)
Hemoglobin: 9.5 g/dL — ABNORMAL LOW (ref 12.0–15.0)
Immature Granulocytes: 1 %
Lymphocytes Relative: 61 %
Lymphs Abs: 1.5 10*3/uL (ref 0.7–4.0)
MCH: 30.5 pg (ref 26.0–34.0)
MCHC: 34.7 g/dL (ref 30.0–36.0)
MCV: 88.1 fL (ref 80.0–100.0)
Monocytes Absolute: 0.3 10*3/uL (ref 0.1–1.0)
Monocytes Relative: 12 %
Neutro Abs: 0.6 10*3/uL — ABNORMAL LOW (ref 1.7–7.7)
Neutrophils Relative %: 24 %
Platelets: 26 10*3/uL — CL (ref 150–400)
RBC: 3.11 MIL/uL — ABNORMAL LOW (ref 3.87–5.11)
RDW: 18 % — ABNORMAL HIGH (ref 11.5–15.5)
WBC: 2.4 10*3/uL — ABNORMAL LOW (ref 4.0–10.5)
nRBC: 0 % (ref 0.0–0.2)

## 2020-09-04 LAB — BPAM PLATELET PHERESIS
Blood Product Expiration Date: 202204162359
ISSUE DATE / TIME: 202204131200
Unit Type and Rh: 1700

## 2020-09-04 LAB — COMPREHENSIVE METABOLIC PANEL
ALT: 16 U/L (ref 0–44)
AST: 22 U/L (ref 15–41)
Albumin: 3.9 g/dL (ref 3.5–5.0)
Alkaline Phosphatase: 76 U/L (ref 38–126)
Anion gap: 6 (ref 5–15)
BUN: 9 mg/dL (ref 8–23)
CO2: 28 mmol/L (ref 22–32)
Calcium: 9.2 mg/dL (ref 8.9–10.3)
Chloride: 95 mmol/L — ABNORMAL LOW (ref 98–111)
Creatinine, Ser: 0.54 mg/dL (ref 0.44–1.00)
GFR, Estimated: >60 mL/min (ref >60)
Glucose, Bld: 101 mg/dL — ABNORMAL HIGH (ref 70–99)
Potassium: 3.8 mmol/L (ref 3.5–5.1)
Sodium: 129 mmol/L — ABNORMAL LOW (ref 135–145)
Total Bilirubin: 1 mg/dL (ref 0.3–1.2)
Total Protein: 6.5 g/dL (ref 6.5–8.1)

## 2020-09-04 LAB — URINE CULTURE: Culture: <10000 — AB

## 2020-09-04 LAB — PREPARE PLATELET PHERESIS: Unit division: 0

## 2020-09-04 LAB — URIC ACID: Uric Acid, Serum: 3.2 mg/dL (ref 2.5–7.1)

## 2020-09-04 MED ORDER — HEPARIN SOD (PORK) LOCK FLUSH 100 UNIT/ML IV SOLN
500.0000 [IU] | Freq: Once | INTRAVENOUS | Status: AC
Start: 2020-09-04 — End: 2020-09-04
  Administered 2020-09-04: 500 [IU] via INTRAVENOUS
  Filled 2020-09-04: qty 5

## 2020-09-04 MED ORDER — NYSTATIN 100000 UNIT/ML MT SUSP: 5.0000 mL | Freq: Four times a day (QID) | OROMUCOSAL | 0 refills | Status: DC

## 2020-09-04 NOTE — Telephone Encounter (Signed)
Nystatin and Robitussin rx for pt

## 2020-09-04 NOTE — Telephone Encounter (Signed)
Scheduled for [place] Inoc[employer]io [employer] Fri 09/05/20 @ 2:30p Arrive 2p. Patient is scheduled to get a COVID test done at 8:00 am tomorrow. Pt is aware of all of this.

## 2020-09-05 ENCOUNTER — Inpatient Hospital Stay: Payer: Medicare HMO

## 2020-09-05 ENCOUNTER — Ambulatory Visit: Admission: RE | Admit: 2020-09-05 | Payer: Medicare HMO | Source: Ambulatory Visit

## 2020-09-05 ENCOUNTER — Other Ambulatory Visit
Admission: RE | Admit: 2020-09-05 | Discharge: 2020-09-05 | Disposition: A | Discharge status: Home / Self Care | Payer: Medicare HMO | Source: Ambulatory Visit | Attending: Hematology and Oncology | Admitting: Hematology and Oncology

## 2020-09-05 ENCOUNTER — Other Ambulatory Visit: Payer: Self-pay | Admitting: Hematology and Oncology

## 2020-09-05 DIAGNOSIS — D696 Thrombocytopenia, unspecified: Secondary | ICD-10-CM

## 2020-09-05 DIAGNOSIS — Z01812 Encounter for preprocedural laboratory examination: Secondary | ICD-10-CM | POA: Insufficient documentation

## 2020-09-05 DIAGNOSIS — C8219 Follicular lymphoma grade II, extranodal and solid organ sites: Secondary | ICD-10-CM

## 2020-09-05 DIAGNOSIS — Z20822 Contact with and (suspected) exposure to covid-19: Secondary | ICD-10-CM | POA: Insufficient documentation

## 2020-09-05 DIAGNOSIS — D649 Anemia, unspecified: Secondary | ICD-10-CM

## 2020-09-05 DIAGNOSIS — J90 Pleural effusion, not elsewhere classified: Secondary | ICD-10-CM

## 2020-09-05 LAB — CBC WITH DIFFERENTIAL/PLATELET
Abs Immature Granulocytes: 0.34 10*3/uL — ABNORMAL HIGH (ref 0.00–0.07)
Basophils Absolute: 0.1 10*3/uL (ref 0.0–0.1)
Basophils Relative: 1 %
Eosinophils Absolute: 0 10*3/uL (ref 0.0–0.5)
Eosinophils Relative: 1 %
HCT: 27.5 % — ABNORMAL LOW (ref 36.0–46.0)
Hemoglobin: 9.4 g/dL — ABNORMAL LOW (ref 12.0–15.0)
Immature Granulocytes: 6 %
Lymphocytes Relative: 34 %
Lymphs Abs: 1.9 10*3/uL (ref 0.7–4.0)
MCH: 30.7 pg (ref 26.0–34.0)
MCHC: 34.2 g/dL (ref 30.0–36.0)
MCV: 89.9 fL (ref 80.0–100.0)
Monocytes Absolute: 0.8 10*3/uL (ref 0.1–1.0)
Monocytes Relative: 15 %
Neutro Abs: 2.4 10*3/uL (ref 1.7–7.7)
Neutrophils Relative %: 43 %
Platelets: 30 10*3/uL — ABNORMAL LOW (ref 150–400)
RBC: 3.06 MIL/uL — ABNORMAL LOW (ref 3.87–5.11)
RDW: 17.2 % — ABNORMAL HIGH (ref 11.5–15.5)
Smear Review: NORMAL
WBC: 5.5 10*3/uL (ref 4.0–10.5)
nRBC: 0.4 % — ABNORMAL HIGH (ref 0.0–0.2)

## 2020-09-05 LAB — SARS CORONAVIRUS 2 BY RT PCR (HOSPITAL ORDER, PERFORMED IN ~~LOC~~ HOSPITAL LAB): SARS Coronavirus 2: NEGATIVE

## 2020-09-05 LAB — SAMPLE TO BLOOD BANK

## 2020-09-06 NOTE — Progress Notes (Signed)
[facility]  [address] [place], [place] [address] Phone: [phone]  Fax: [phone]   Clinic Day:  09/09/2020  Referring physician: [name], MD  Chief Complaint: Natasha Farrell is a 75 y.o. female with grade II stage IVB follicular lymphoma who is seen for assessment on day 14 s/p cycle #1 RCHOP.  HPI:  The patient was last seen in the medical oncology clinic on 09/04/2020. At that time, she had a persistent cough and shortness of breath secondary to a recurrent left sided pleural effusion. Hematocrit was 27.4, hemoglobin 9.5, MCV 88.1, platelets 26,000, WBC 2,400 (ANC 600). Sodium was 129. Uric acid was 3.2. CXR showed significantly enlarged left pleural effusion compared to prior exam. Thoracentesis was scheduled for 09/05/2020, then rescheduled for 09/08/2020.  CBC has been followed: 09/05/2020:  Hematocrit 27.5, hemoglobin 9.4, MCV 89.9, platelets 30,000, WBC   5,500. 09/08/2020:  Hematocrit 27.3, hemoglobin 9.0, MCV 95.5, platelets 45,000, WBC 29,000.  She underwent ultrasound guided thoracentesis on 09/08/2020.  1.7 liters of bloody pleural fluid was removed. She received 1 unit of platelets prior to the procedure to ensure a platelet count > 50,000.   During the interim, she has been "ok". She feels better and is less short of breath after her thoracentesis. She slept a lot better last night, and was able to sleep on the couch. She bruises easily and her vision is blurry. The patient denies nose bleeds, gum bleeds, blood in the stool. She is eating well.  She takes sodium tablets BID, vitamin O96, and folic acid. She is still using Nystatin swish and spit; she does not have any thrush this morning. She has not taken any nausea medication.   Past Medical History:  Diagnosis Date  . Hx of hysterectomy   . Hyperlipidemia   . Hypertension   . Thyroid disease    rt thryroidectomy  09/25/2010    Past Surgical History:   Procedure Laterality Date  . BREAST BIOPSY Right 04/21/2020   hydromark 3 [place] bx path pending  . LYMPH NODE BIOPSY Left 08/01/2020   Procedure: LYMPH NODE BIOPSY;  Surgeon: Robert Bellow, MD;  Location: [facility] ORS;  Service: General;  Laterality: Left;  . PORTACATH PLACEMENT Left 08/22/2020   Procedure: INSERTION PORT-A-CATH;  Surgeon: Robert Bellow, MD;  Location: [facility] ORS;  Service: General;  Laterality: Left;    Family History  Problem Relation Age of Onset  . Prostate cancer Father   . Breast cancer Sister 68  . Colon cancer Sister   . Kidney cancer Sister     Social History:  reports that she has never smoked. She has never used smokeless tobacco. She reports that she does not drink alcohol and does not use drugs. She does not use alcohol or drugs. She denies exposure to radiation or toxins. She is a retired Librarian, academic. Her husband's name is Josph Macho. The patient is accompanied by Josph Macho today.  Allergies:  Allergies  Allergen Reactions  . Lisinopril Swelling    angioedema    Current Medications: Current Outpatient Medications  Medication Sig Dispense Refill  . acetaminophen (TYLENOL) 500 MG tablet Take 500 mg by mouth every 6 (six) hours as needed for moderate pain, fever or headache.    . allopurinol (ZYLOPRIM) 300 MG tablet Take 1 tablet (300 mg total) by mouth daily. 30 tablet 2  . benzonatate (TESSALON) 100 MG capsule Take 1 capsule (100 mg total) by mouth 3 (three) times daily as needed  for cough. 30 capsule 0  . carvedilol (COREG) 25 MG tablet Take 1 tablet (25 mg total) by mouth 2 (two) times daily. 180 tablet 3  . cholecalciferol (VITAMIN D3) 25 MCG (1000 UNIT) tablet Take 2,000 Units by mouth daily.    . cyanocobalamin 1000 MCG tablet Take 1,000 mcg by mouth daily.    . feeding supplement (ENSURE ENLIVE / ENSURE PLUS) LIQD Take 237 mLs by mouth 2 (two) times daily between meals. 619 mL 12  . folic acid (FOLVITE) 1 MG tablet Take 1 tablet (1 mg total) by mouth daily.  30 tablet 1  . levothyroxine (SYNTHROID) 50 MCG tablet Take 50 mcg by mouth daily before breakfast.    . lidocaine-prilocaine (EMLA) cream Apply 1 application topically as needed. Apply small amount to port site at least 1 hour prior to it being accessed, cover with plastic wrap 30 g 1  . loratadine (CLARITIN) 10 MG tablet Take 10 mg by mouth daily as needed for allergies.    . Multiple Vitamin (MULTIVITAMIN WITH MINERALS) TABS tablet Take 1 tablet by mouth daily.    Marland Kitchen nystatin (MYCOSTATIN) 100000 UNIT/ML suspension Take 5 mLs (500,000 Units total) by mouth 4 (four) times daily. Swish and spit 60 mL 0  . omeprazole (PRILOSEC) 20 MG capsule Take 1 capsule (20 mg total) by mouth daily. 30 capsule 3  . ondansetron (ZOFRAN) 8 MG tablet Take 1 tablet (8 mg total) by mouth every 8 (eight) hours as needed for nausea or vomiting. 20 tablet 3  . pravastatin (PRAVACHOL) 40 MG tablet Take 40 mg by mouth daily.    . prochlorperazine (COMPAZINE) 10 MG tablet Take 1 tablet (10 mg total) by mouth every 6 (six) hours as needed (Nausea or vomiting). 30 tablet 6  . sodium chloride 1 g tablet Take 1 tablet (1 g total) by mouth 2 (two) times daily with a meal. 60 tablet 0  . spironolactone (ALDACTONE) 50 MG tablet Take 0.5 tablet (25 mg) by mouth once daily     . predniSONE (DELTASONE) 20 MG tablet Take 5 tablets (100 mg total) by mouth daily. Take with food on days 1-5 of chemotherapy. (Patient not taking: No sig reported) 25 tablet 5   No current facility-administered medications for this visit.    Review of Systems  Constitutional: Positive for weight loss (3 lbs). Negative for chills, diaphoresis, fever and malaise/fatigue.  HENT: Negative for congestion, ear discharge, ear pain, hearing loss, nosebleeds, sinus pain, sore throat and tinnitus.   Eyes: Positive for blurred vision.  Respiratory: Positive for cough (non productive) and shortness of breath (improved). Negative for hemoptysis and sputum production.    Cardiovascular: Negative for chest pain, palpitations, orthopnea and leg swelling.  Gastrointestinal: Negative for abdominal pain, blood in stool, constipation, diarrhea, heartburn, melena, nausea and vomiting.       Eating well  Genitourinary: Negative for dysuria, frequency, hematuria and urgency.  Musculoskeletal: Negative for back pain, joint pain, myalgias and neck pain.  Skin: Negative for itching and rash.  Neurological: Negative for dizziness, tingling, sensory change, weakness and headaches.  Endo/Heme/Allergies: Does not bruise/bleed easily.  Psychiatric/Behavioral: Negative for depression and memory loss. The patient is not nervous/anxious and does not have insomnia.   All other systems reviewed and are negative.  Performance status (ECOG): 1  Vitals Blood pressure 140/67, pulse 88, temperature (!) 97.2 F (36.2 C), resp. rate 16, weight 159 lb 9.8 oz (72.4 kg), SpO2 100 %.   Physical Exam Vitals and  nursing note reviewed.  Constitutional:      General: She is not in acute distress.    Appearance: She is not diaphoretic.  HENT:     Head: Normocephalic and atraumatic.     Mouth/Throat:     Mouth: Mucous membranes are moist.     Pharynx: Oropharynx is clear.     Comments: Thrush resolved. Eyes:     General: No scleral icterus.    Extraocular Movements: Extraocular movements intact.     Conjunctiva/sclera: Conjunctivae normal.     Pupils: Pupils are equal, round, and reactive to light.     Comments: Glasses.  Brown eyes.  Cardiovascular:     Rate and Rhythm: Normal rate and regular rhythm.     Heart sounds: Normal heart sounds. No murmur heard.   Pulmonary:     Effort: Pulmonary effort is normal. No respiratory distress.     Breath sounds: Normal breath sounds. No wheezing or rales.  Chest:     Chest wall: No tenderness.  Breasts:     Right: No axillary adenopathy or supraclavicular adenopathy.     Left: No axillary adenopathy or supraclavicular adenopathy.     Abdominal:     General: Bowel sounds are normal. There is no distension.     Palpations: Abdomen is soft. There is hepatomegaly and splenomegaly. There is no mass.     Tenderness: There is no abdominal tenderness. There is no guarding or rebound.     Comments: Decreasing hepatosplenomegaly.  Musculoskeletal:        General: No swelling or tenderness. Normal range of motion.     Cervical back: Normal range of motion and neck supple.     Right lower leg: No edema.     Left lower leg: No edema.  Lymphadenopathy:     Head:     Right side of head: No submandibular, preauricular, posterior auricular or occipital adenopathy.     Left side of head: No submandibular, preauricular, posterior auricular or occipital adenopathy.     Cervical: No cervical adenopathy.     Upper Body:     Right upper body: No supraclavicular or axillary adenopathy.     Left upper body: No supraclavicular or axillary adenopathy.     Lower Body: No right inguinal adenopathy. No left inguinal adenopathy.  Skin:    General: Skin is warm and dry.  Neurological:     Mental Status: She is alert and oriented to person, place, and time.  Psychiatric:        Behavior: Behavior normal.        Thought Content: Thought content normal.        Judgment: Judgment normal.    Appointment on 09/09/2020  Component Date Value Ref Range Status  . Sodium 09/09/2020 133* 135 - 145 mmol/L Final  . Potassium 09/09/2020 3.9  3.5 - 5.1 mmol/L Final  . Chloride 09/09/2020 98  98 - 111 mmol/L Final  . CO2 09/09/2020 30  22 - 32 mmol/L Final  . Glucose, Bld 09/09/2020 105* 70 - 99 mg/dL Final   Glucose reference range applies only to samples taken after fasting for at least 8 hours.  . BUN 09/09/2020 10  8 - 23 mg/dL Final  . Creatinine, Ser 09/09/2020 0.63  0.44 - 1.00 mg/dL Final  . Calcium 09/09/2020 9.3  8.9 - 10.3 mg/dL Final  . GFR, Estimated 09/09/2020 >60  >60 mL/min Final   Comment: (NOTE) Calculated using the CKD-EPI  Creatinine Equation (2021)   .  Anion gap 09/09/2020 5  5 - 15 Final   Performed at [facility], [address], [place], [place] [address]  . WBC 09/09/2020 29.7* 4.0 - 10.5 K/uL Final  . RBC 09/09/2020 2.98* 3.87 - 5.11 MIL/uL Final  . Hemoglobin 09/09/2020 9.2* 12.0 - 15.0 g/dL Final  . HCT 09/09/2020 28.2* 36.0 - 46.0 % Final  . MCV 09/09/2020 94.6  80.0 - 100.0 fL Final  . MCH 09/09/2020 30.9  26.0 - 34.0 pg Final  . MCHC 09/09/2020 32.6  30.0 - 36.0 g/dL Final  . RDW 09/09/2020 18.5* 11.5 - 15.5 % Final  . Platelets 09/09/2020 69* 150 - 400 K/uL Final   Comment: Immature Platelet Fraction may be clinically indicated, consider ordering this additional test [id number] CONSISTENT WITH PREVIOUS RESULT   . nRBC 09/09/2020 0.2  0.0 - 0.2 % Final   Performed at [facility], [address], [place], [place] [address]  . Neutrophils Relative % 09/09/2020 PENDING  % Incomplete  . Neutro Abs 09/09/2020 PENDING  1.7 - 7.7 K/uL Incomplete  . Band Neutrophils 09/09/2020 PENDING  % Incomplete  . Lymphocytes Relative 09/09/2020 PENDING  % Incomplete  . Lymphs Abs 09/09/2020 PENDING  0.7 - 4.0 K/uL Incomplete  . Monocytes Relative 09/09/2020 PENDING  % Incomplete  . Monocytes Absolute 09/09/2020 PENDING  0.1 - 1.0 K/uL Incomplete  . Eosinophils Relative 09/09/2020 PENDING  % Incomplete  . Eosinophils Absolute 09/09/2020 PENDING  0.0 - 0.5 K/uL Incomplete  . Basophils Relative 09/09/2020 PENDING  % Incomplete  . Basophils Absolute 09/09/2020 PENDING  0.0 - 0.1 K/uL Incomplete  . WBC Morphology 09/09/2020 PENDING   Incomplete  . RBC Morphology 09/09/2020 PENDING   Incomplete  . Smear Review 09/09/2020 PENDING   Incomplete  . Other 09/09/2020 PENDING  % Incomplete  . nRBC 09/09/2020 PENDING  0 /100 WBC Incomplete  . Metamyelocytes Relative 09/09/2020 PENDING  % Incomplete  . Myelocytes 09/09/2020 PENDING  % Incomplete  . Promyelocytes Relative 09/09/2020 PENDING  %  Incomplete  . Blasts 09/09/2020 PENDING  % Incomplete  . Immature Granulocytes 09/09/2020 PENDING  % Incomplete  . Abs Immature Granulocytes 09/09/2020 PENDING  0.00 - 0.07 K/uL Incomplete  Hospital Outpatient Visit on 09/08/2020  Component Date Value Ref Range Status  . LD, Fluid 09/08/2020 159* 3 - 23 U/L Final   Comment: (NOTE) Results should be evaluated in conjunction with serum values   . Fluid Type-FLDH 09/08/2020 CYTO PLEU   Final   Performed at [facility], [address] [place], [place] [address]  . Fluid Type-FCT 09/08/2020 CYTO PLEU   Final  . Color, Fluid 09/08/2020 RED* YELLOW Final  . Appearance, Fluid 09/08/2020 TRICHOPHYTON RUBRUM* CLEAR Final  . Total Nucleated Cell Count, Fluid 09/08/2020 6,370  cu mm Final  . Neutrophil Count, Fluid 09/08/2020 10  % Final  . Lymphs, Fluid 09/08/2020 70  % Final  . Monocyte-Macrophage-Serous Fluid 09/08/2020 20  % Final  . Eos, Fluid 09/08/2020 0  % Final   Performed at [facility], [address] [place], [place] [address]  . Total protein, fluid 09/08/2020 4.7  g/dL Final   Comment: (NOTE) No normal range established for this test Results should be evaluated in conjunction with serum values   . Fluid Type-FTP 09/08/2020 CYTO PLEU   Final   Performed at [facility], [address] [place], [place] [address]  . Glucose, Fluid 09/08/2020 117  mg/dL Final  Comment: (NOTE) No normal range established for this test Results should be evaluated in conjunction with serum values   . Fluid Type-FGLU 09/08/2020 CYTO PLEU   Final   Performed at [facility], [address] [place], [place] [address]  . Specimen Description 09/08/2020    Final                   Value:PLEURAL Performed at [facility], [address] [place], [place] [address]   . Special Requests 09/08/2020    Final                   Value:NONE Performed at [facility], [address]  [place], [place] [address]   . Gram Stain 09/08/2020    Final                   Value:NO WBC SEEN NO ORGANISMS SEEN Performed at [facility], [place] [address] [place], [place] [address]   . Culture 09/08/2020 PENDING   Incomplete  . Report Status 09/08/2020 PENDING   Incomplete  Infusion on 09/08/2020  Component Date Value Ref Range Status  . Unit Number 09/08/2020 [id number]   Final  . Blood Component Type 09/08/2020 PLTP2 PSORALEN TREATED   Final  . Unit division 09/08/2020 00   Final  . Status of Unit 09/08/2020 REL FROM [facility]   Final  . Transfusion Status 09/08/2020 OK TO TRANSFUSE   Final  . Unit Number 09/08/2020 [id number]   Final  . Blood Component Type 09/08/2020 PLTP2 PSORALEN TREATED   Final  . Unit division 09/08/2020 00   Final  . Status of Unit 09/08/2020 ISSUED,FINAL   Final  . Transfusion Status 09/08/2020    Final                   Value:OK TO TRANSFUSE Performed at [facility], [address], [place], [place] [address]   . ISSUE DATE / TIME 09/08/2020 [id number]   Final  . Blood Product Unit Number 09/08/2020 [id number]   Final  . PRODUCT CODE 09/08/2020 [id number]   Final  . Unit Type and Rh 09/08/2020 5100   Final  . Blood Product Expiration Date 09/08/2020 [id number]   Final  . ISSUE DATE / TIME 09/08/2020 [id number]   Final  . Blood Product Unit Number 09/08/2020 [id number]   Final  . PRODUCT CODE 09/08/2020 [id number]   Final  . Unit Type and Rh 09/08/2020 7300   Final  . Blood Product Expiration Date 09/08/2020 [id number]   Final  . Platelets 09/08/2020 69* 150 - 400 K/uL Final   Comment: SPECIMEN CHECKED FOR CLOTS Performed at [facility], [address] [place], [place] [address]   Appointment on 09/08/2020  Component Date Value Ref Range Status  . WBC 09/08/2020 29.0* 4.0 - 10.5 K/uL Final  . RBC 09/08/2020 2.86* 3.87 - 5.11 MIL/uL Final  . Hemoglobin 09/08/2020 9.0* 12.0 - 15.0 g/dL Final  . HCT 09/08/2020  27.3* 36.0 - 46.0 % Final  . MCV 09/08/2020 95.5  80.0 - 100.0 fL Final  . MCH 09/08/2020 31.5  26.0 - 34.0 pg Final  . MCHC 09/08/2020 33.0  30.0 - 36.0 g/dL Final  . RDW 09/08/2020 17.9* 11.5 - 15.5 % Final  . Platelets 09/08/2020 45* 150 - 400 K/uL Final   Comment: SPECIMEN CHECKED FOR CLOTS Immature Platelet Fraction may be clinically indicated, consider ordering this additional test [id number]   .  nRBC 09/08/2020 0.3* 0.0 - 0.2 % Final  . Neutrophils Relative % 09/08/2020 56  % Final  . Neutro Abs 09/08/2020 16.4* 1.7 - 7.7 K/uL Final  . Lymphocytes Relative 09/08/2020 10  % Final  . Lymphs Abs 09/08/2020 3.0  0.7 - 4.0 K/uL Final  . Monocytes Relative 09/08/2020 15  % Final  . Monocytes Absolute 09/08/2020 4.2* 0.1 - 1.0 K/uL Final  . Eosinophils Relative 09/08/2020 0  % Final  . Eosinophils Absolute 09/08/2020 0.0  0.0 - 0.5 K/uL Final  . Basophils Relative 09/08/2020 0  % Final  . Basophils Absolute 09/08/2020 0.1  0.0 - 0.1 K/uL Final  . WBC Morphology 09/08/2020 MODERATE LEFT SHIFT (>5% METAS AND MYELOS,OCC PRO NOTED)   Final   DIFF. CONFIRMED BY SMEAR  . RBC Morphology 09/08/2020 MORPHOLOGY UNREMARKABLE   Final  . Smear Review 09/08/2020 Normal platelet morphology   Final   PLATELETS APPEAR DECREASED  . Immature Granulocytes 09/08/2020 19  % Final   Comment: Increased IG's, likely caused by Bone Marrow Colony Stimulating Factor received within 30 days. UDENYCA ON 4.7.22   . Abs Immature Granulocytes 09/08/2020 5.42* 0.00 - 0.07 K/uL Final   Performed at [facility], [address] [place],  [address]    Assessment:  Natasha Farrell is a 75 y.o. female with grade II stage IVB follicular lymphoma s/p excisional biopsy on 08/01/2020.  She presented with extensive adenopathy and massive splenomegaly.  Right axillary ultrasound guided biopsy on 04/21/2020 revealed fragments of lymph node with reactive follicular hyperplasia. There was no definite evidence of  malignancy. Flow cytometry revealed a CD10+ B cell population. Clonality could not be evaluated due to non-specific light chain binding. There was no evidence of a T cell lymphoproliferative disorder.  Chest CT angiogram on 07/21/2020 revealed extensive lymphadenopathy throughout the neck, chest, and visualized portions of the abdomen, along with marked splenomegaly.  Largest right axillary lymph node was 2.1 x 1.6 cm and left axillary lymph node 3.0 x 1.5 cm.  There were enlarged lymph nodes interposed between the brachiocephalic vein and left subclavian measuring 2.1 x 1.6 cm.  Overall, findings favored leukemia or lymphoma. There was no evidence of pulmonary embolus. There was a small left pleural effusion.   Work-up on 07/28/2020 revealed a hematocrit of 36.4, hemoglobin 12.3, platelets 63,000, WBC 8,500 ([place] 5200).  Calcium was 10.1 with an albumin of 4.5. Total protein was 9.0 (6.5-8.1). G6PD assay was 7.7 (normal). Normal studies included: hepatitis B surface antigen, hepatitis B core antibody, hepatitis C antibody, and HIV antibody.  LDH was 182. Uric acid was 9.3 (2.5-7.1); she began allopurinol.  Bone marrow on 08/04/2020 revealed variably cellular bone marrow with trilineage hematopoiesis and involvement by an atypical lymphoid proliferation  The bone marrow core biopsy demonstrated involvement by an atypical lymphoid proliferation with paratrabecular lymphoid aggregates and  patchy infiltration of the bone marrow space with accompanying fibrosis. The infiltrate was composed of mixed B-cells and T-cells. At least a subset of the B-cells co-expressed CD10 and BCL6 without definitive BCL2 expression. Scattered larger CD20-positive larger B-cells were also present. Flow cytometry showed no definitive atypical B-cell population. No immunophenotypically aberrant T-cell population was identified. There were no increase in blasts. Monocytes showed heterogeneous CD56 expression.  Cytogenetics were normal  (22, XX).  PET scan on 08/12/2020 revealed widespread adenopathy, visceral involvement and signs of near diffuse, multifocal bony involvement, distribution most c/w lymphoma, [place] category 5. There was ileal involvement as well. Peritoneal  involvement was suggested as well with nodular changes about the peritoneum but without significant FDG uptake. There was paraspinal soft tissue without frank bony destruction. Scattered areas of sclerosis underestimated the degree of bony involvement. Abdominal ascites may be related to anasarca and or lymphatic congestion with marked enlargement of LEFT-sided pleural effusion and mild enlargement of small RIGHT-sided pleural fluid.  Thoracentesis is scheduled for 08/15/2020.  Echo on 07/24/2020 revealed an EF of 60-65%.  She has had a recurrent left sided pleural effusion.  She underwent thoracentesis on 08/15/2020 (1.6 liters), 08/23/2020 (1.5 liters), and 09/08/2020 (1.7 liters).  She is day 14 s/p RCHOP (08/27/2020) with Udencya support.  She received 2 unit of PRBCs and 1 pheresed platelet transfusion with cycle #1.  She has a family history of malignancy.  Her father died from prostate cancer. Her sister had breast cancer. Another sister had colon cancer and kidney cancer.  She was admitted to [facility] from 08/15/2020 - 08/20/2020 for acute respiratory failure with hypoxia and sepsis. She was felt to have re-expansion pulmonary edema.   She was admitted to [facility] from 08/22/2020 - 08/24/2020. She had increased shortness of breath after her port placement. She underwent thoracentesis for recurrent left pleural effusion.  The patient received the COVID-19 vaccine in 06/2019 and 07/2019. The Moderna Booster was on 03/17/2020.   Symptomatically, she feels "ok". She is less short of breath after her thoracentesis. She was able to sleep on the couch (orthopnea improving). She bruises easily and her vision is blurry. She is eating well.  Exam reveals decreasing  hepatosplenomegaly.  Plan: 1.   Labs today: CBC with diff, BMP, hold tube. 2.   Grade II stage IVB follicular lymphoma  PET scan on 08/12/2020 revealed widespread adenopathy, visceral involvement and near diffuse, multifocal bony involvement.   Clinically and SUV activity suggest possible transformation.  Bone marrow on 08/04/2020 revealed involvement by an atypical lymphoid proliferation.   Flow cytometry was negative.  Plan: RCHOP x 6 cycles with Udencya support.  She is day 14 s/p cycle #1 RCHOP chemotherapy.   Platelets are maintained > 20,000 secondary to recurrent hemorrhagic pleural effusion.   Blood products are leukopoor and irradiated.  She has undergone her 3rd thoracentesis (last 09/08/2020).   Time between procedures has increased.  Clinically, she continues to do well with rapid resolution of bulky adenopathy and decreasing hepatosplenomegaly.  Review plan to continue R-CHOP chemotherapy every 3 weeks.   She did not have tumor lysis with cycle #1   She did experience a reaction with Rituxan cycle #1.  Discuss symptom management.  She has antiemetics at home to use on a prn bases.  Interventions are adequate.       3.   Thrombocytopenia  Platelet count is 69,000.  Etiology felt secondary to massive hepatosplenomegaly and myelosuppression s/p chemotherapy.  Platelets are maintained > 20,000 secondary to recurrent hemorrhagic pleural effusion.   She received 1 unit of platelets on 09/03/2020 and 09/08/2020.   She requires platelets  > 50,000 for thoracentesis.  Platelet count should continue to improve with treatment.   Platelet count was 72,000 on day 1 of cycle #1 RCHOP.  Continue to monitor closely with treatment. 4.   Normocytic anemia  Hematocrit 28.2.  Hemoglobin 9.2.  MCV 94.6.   Ferritin 271 with an iron sat 25% and a TIBC 330 on 08/20/2020.   Folate 5.1 (low) and B12 206 (low) on 08/21/2018 on 08/20/2020.  She received 2 units of PRBCs on 09/02/2020 for  a  hemoglobin of 6.7.  Continue B12 and folate supplementation.   Check B12 and folate after 1 month of supplementation.  Continue to monitor. 5.   Leukopenia, resolved   WBC   2,400 with an [place] of      600 on 09/04/2020.  WBC 29,700 with an [place] of 18,000 on 09/09/2020 s/p Udencya. 6.   Recurrent left pleural effusion  Patient has undergone thoracentesis x 3 (08/15/2020, 08/23/2020, and 09/08/2020).  Maintain platelets > 20,000 secondary to hemorrhagic pleural fluid.  Platelets need to be 50,000 for thoracentesis. 7.   Hyponatremia  Sodium 133.  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  Patient on free water restriction.    Fluids with electrolytes.  Continue salt tablets 1 gm BID. 8.   Thrush, resolved  Discuss use of Nystatin swish and spit prophylactically. 9.   Cough  Cough aggravated by adenopathy and pleural effusion.  Cough has improved since last thoracentesis.  Patient request cough medication to help her sleep at night.   Tessalon Perles were ineffective.   Rx: Robitussen with codeine 10.   RTC on 09/17/2020 for MD assessment, labs (CBC with diff, CMP, LDH, uric acid), and cycle #2 RCHOP with Udencya support.  I discussed the assessment and treatment plan with the patient.  The patient was provided an opportunity to ask questions and all were answered.  The patient agreed with the plan and demonstrated an understanding of the instructions.  The patient was advised to call back if the symptoms worsen or if the condition fails to improve as anticipated.  I provided 15 minutes of face-to-face time during this this encounter and > 50% was spent counseling as documented under my assessment and plan.  An additional 10 minutes were spent reviewing her chart (Epic and Care Everywhere) including notes, labs, and imaging studies.    Naoko Diperna C. Mike Gip, MD, PhD    09/09/2020, 12:58 PM  I, Mirian Mo Tufford, am acting as Education administrator for [employer]. Mike Gip, MD, PhD.  I, Yanis Larin C. Mike Gip, MD,  have reviewed the above documentation for accuracy and completeness, and I agree with the above.

## 2020-09-08 ENCOUNTER — Other Ambulatory Visit: Payer: Self-pay | Admitting: Interventional Radiology

## 2020-09-08 ENCOUNTER — Other Ambulatory Visit: Payer: Self-pay | Admitting: Hematology and Oncology

## 2020-09-08 ENCOUNTER — Other Ambulatory Visit: Payer: Self-pay

## 2020-09-08 ENCOUNTER — Inpatient Hospital Stay: Payer: Medicare HMO

## 2020-09-08 ENCOUNTER — Ambulatory Visit
Admission: RE | Admit: 2020-09-08 | Discharge: 2020-09-08 | Disposition: A | Discharge status: Home / Self Care | Payer: Medicare HMO | Source: Ambulatory Visit | Attending: Interventional Radiology | Admitting: Interventional Radiology

## 2020-09-08 ENCOUNTER — Ambulatory Visit
Admission: RE | Admit: 2020-09-08 | Discharge: 2020-09-08 | Disposition: A | Discharge status: Home / Self Care | Payer: Medicare HMO | Source: Ambulatory Visit | Attending: Hematology and Oncology | Admitting: Hematology and Oncology

## 2020-09-08 VITALS — BP 136/69 | HR 88 | Temp 97.0°F | Resp 18

## 2020-09-08 DIAGNOSIS — J90 Pleural effusion, not elsewhere classified: Secondary | ICD-10-CM

## 2020-09-08 DIAGNOSIS — D696 Thrombocytopenia, unspecified: Secondary | ICD-10-CM

## 2020-09-08 DIAGNOSIS — C8219 Follicular lymphoma grade II, extranodal and solid organ sites: Secondary | ICD-10-CM

## 2020-09-08 DIAGNOSIS — D649 Anemia, unspecified: Secondary | ICD-10-CM | POA: Diagnosis not present

## 2020-09-08 LAB — CBC WITH DIFFERENTIAL/PLATELET
Abs Immature Granulocytes: 5.42 10*3/uL — ABNORMAL HIGH (ref 0.00–0.07)
Basophils Absolute: 0.1 10*3/uL (ref 0.0–0.1)
Basophils Relative: 0 %
Eosinophils Absolute: 0 10*3/uL (ref 0.0–0.5)
Eosinophils Relative: 0 %
HCT: 27.3 % — ABNORMAL LOW (ref 36.0–46.0)
Hemoglobin: 9 g/dL — ABNORMAL LOW (ref 12.0–15.0)
Immature Granulocytes: 19 %
Lymphocytes Relative: 10 %
Lymphs Abs: 3 10*3/uL (ref 0.7–4.0)
MCH: 31.5 pg (ref 26.0–34.0)
MCHC: 33 g/dL (ref 30.0–36.0)
MCV: 95.5 fL (ref 80.0–100.0)
Monocytes Absolute: 4.2 10*3/uL — ABNORMAL HIGH (ref 0.1–1.0)
Monocytes Relative: 15 %
Neutro Abs: 16.4 10*3/uL — ABNORMAL HIGH (ref 1.7–7.7)
Neutrophils Relative %: 56 %
Platelets: 45 10*3/uL — ABNORMAL LOW (ref 150–400)
RBC: 2.86 MIL/uL — ABNORMAL LOW (ref 3.87–5.11)
RDW: 17.9 % — ABNORMAL HIGH (ref 11.5–15.5)
Smear Review: NORMAL
WBC: 29 10*3/uL — ABNORMAL HIGH (ref 4.0–10.5)
nRBC: 0.3 % — ABNORMAL HIGH (ref 0.0–0.2)

## 2020-09-08 LAB — GLUCOSE, PLEURAL OR PERITONEAL FLUID: Glucose, Fluid: 117 mg/dL

## 2020-09-08 LAB — PROTEIN, PLEURAL OR PERITONEAL FLUID: Total protein, fluid: 4.7 g/dL

## 2020-09-08 LAB — LACTATE DEHYDROGENASE, PLEURAL OR PERITONEAL FLUID: LD, Fluid: 159 U/L — ABNORMAL HIGH (ref 3–23)

## 2020-09-08 LAB — PLATELET COUNT: Platelets: 69 10*3/uL — ABNORMAL LOW (ref 150–400)

## 2020-09-08 MED ORDER — DIPHENHYDRAMINE HCL 25 MG PO CAPS
25.0000 mg | ORAL_CAPSULE | Freq: Once | ORAL | Status: AC
Start: 2020-09-08 — End: 2020-09-08
  Administered 2020-09-08: 25 mg via ORAL
  Filled 2020-09-08: qty 1

## 2020-09-08 MED ORDER — ACETAMINOPHEN 325 MG PO TABS
650.0000 mg | ORAL_TABLET | Freq: Once | ORAL | Status: AC
Start: 2020-09-08 — End: 2020-09-08
  Administered 2020-09-08: 650 mg via ORAL
  Filled 2020-09-08: qty 2

## 2020-09-08 MED ORDER — SODIUM CHLORIDE 0.9% IV SOLUTION
250.0000 mL | Freq: Once | INTRAVENOUS | Status: AC
Start: 2020-09-08 — End: 2020-09-08
  Administered 2020-09-08: 250 mL via INTRAVENOUS
  Filled 2020-09-08: qty 250

## 2020-09-08 NOTE — Procedures (Signed)
Interventional Radiology Procedure Note  Procedure: [place] LEFT THORACENTESIS    Complications: None  Estimated Blood Loss:  MIN  Findings: 1.7 l BLOODY PLEURAL FLD REMOVED    Tamera Punt, MD

## 2020-09-09 ENCOUNTER — Inpatient Hospital Stay: Payer: Medicare HMO

## 2020-09-09 ENCOUNTER — Other Ambulatory Visit: Payer: Self-pay

## 2020-09-09 ENCOUNTER — Inpatient Hospital Stay (HOSPITAL_BASED_OUTPATIENT_CLINIC_OR_DEPARTMENT_OTHER): Payer: Medicare HMO | Admitting: Hematology and Oncology

## 2020-09-09 ENCOUNTER — Encounter: Payer: Self-pay | Admitting: Hematology and Oncology

## 2020-09-09 VITALS — BP 140/67 | HR 88 | Temp 97.2°F | Resp 16 | Wt 159.6 lb

## 2020-09-09 DIAGNOSIS — C8219 Follicular lymphoma grade II, extranodal and solid organ sites: Secondary | ICD-10-CM

## 2020-09-09 DIAGNOSIS — D649 Anemia, unspecified: Secondary | ICD-10-CM | POA: Diagnosis not present

## 2020-09-09 DIAGNOSIS — D696 Thrombocytopenia, unspecified: Secondary | ICD-10-CM | POA: Diagnosis not present

## 2020-09-09 DIAGNOSIS — Z5112 Encounter for antineoplastic immunotherapy: Secondary | ICD-10-CM | POA: Diagnosis not present

## 2020-09-09 DIAGNOSIS — J90 Pleural effusion, not elsewhere classified: Secondary | ICD-10-CM | POA: Diagnosis not present

## 2020-09-09 LAB — SAMPLE TO BLOOD BANK

## 2020-09-09 LAB — BODY FLUID CELL COUNT WITH DIFFERENTIAL
Eos, Fluid: 0 %
Lymphs, Fluid: 70 %
Monocyte-Macrophage-Serous Fluid: 20 %
Neutrophil Count, Fluid: 10 %
Total Nucleated Cell Count, Fluid: 6370 cu mm

## 2020-09-09 LAB — BPAM PLATELET PHERESIS
Blood Product Expiration Date: 202204182359
Blood Product Expiration Date: 202204212359
ISSUE DATE / TIME: 202204181143
ISSUE DATE / TIME: 202204181152
Unit Type and Rh: 5100
Unit Type and Rh: 7300

## 2020-09-09 LAB — CBC WITH DIFFERENTIAL/PLATELET
Abs Immature Granulocytes: 5.71 10*3/uL — ABNORMAL HIGH (ref 0.00–0.07)
Basophils Absolute: 0.1 10*3/uL (ref 0.0–0.1)
Basophils Relative: 0 %
Eosinophils Absolute: 0 10*3/uL (ref 0.0–0.5)
Eosinophils Relative: 0 %
HCT: 28.2 % — ABNORMAL LOW (ref 36.0–46.0)
Hemoglobin: 9.2 g/dL — ABNORMAL LOW (ref 12.0–15.0)
Immature Granulocytes: 19 %
Lymphocytes Relative: 10 %
Lymphs Abs: 2.8 10*3/uL (ref 0.7–4.0)
MCH: 30.9 pg (ref 26.0–34.0)
MCHC: 32.6 g/dL (ref 30.0–36.0)
MCV: 94.6 fL (ref 80.0–100.0)
Monocytes Absolute: 3 10*3/uL — ABNORMAL HIGH (ref 0.1–1.0)
Monocytes Relative: 10 %
Neutro Abs: 18 10*3/uL — ABNORMAL HIGH (ref 1.7–7.7)
Neutrophils Relative %: 61 %
Platelets: 69 10*3/uL — ABNORMAL LOW (ref 150–400)
RBC: 2.98 MIL/uL — ABNORMAL LOW (ref 3.87–5.11)
RDW: 18.5 % — ABNORMAL HIGH (ref 11.5–15.5)
WBC Morphology: INCREASED
WBC: 29.7 10*3/uL — ABNORMAL HIGH (ref 4.0–10.5)
nRBC: 0.2 % (ref 0.0–0.2)

## 2020-09-09 LAB — PREPARE PLATELET PHERESIS
Unit division: 0
Unit division: 0

## 2020-09-09 LAB — BASIC METABOLIC PANEL
Anion gap: 5 (ref 5–15)
BUN: 10 mg/dL (ref 8–23)
CO2: 30 mmol/L (ref 22–32)
Calcium: 9.3 mg/dL (ref 8.9–10.3)
Chloride: 98 mmol/L (ref 98–111)
Creatinine, Ser: 0.63 mg/dL (ref 0.44–1.00)
GFR, Estimated: >60 mL/min (ref >60)
Glucose, Bld: 105 mg/dL — ABNORMAL HIGH (ref 70–99)
Potassium: 3.9 mmol/L (ref 3.5–5.1)
Sodium: 133 mmol/L — ABNORMAL LOW (ref 135–145)

## 2020-09-09 LAB — PATHOLOGIST SMEAR REVIEW

## 2020-09-09 MED ORDER — GUAIFENESIN-CODEINE 100-10 MG/5ML PO SOLN: 5.0000 mL | Freq: Three times a day (TID) | ORAL | 0 refills | Status: DC | PRN

## 2020-09-09 NOTE — Progress Notes (Signed)
Pt reports she had fluid removed from her left lung yesterday (09/08/2020). Pt believes she may need a decongestant because she has a cough that will not get better. Reports vision is getting very blurry per PCP pt was told chemo might make her vision blurry. As well as, wanting to know if she can receive her pneumonia vaccine.

## 2020-09-10 LAB — PATHOLOGIST SMEAR REVIEW

## 2020-09-12 ENCOUNTER — Telehealth: Payer: Self-pay | Admitting: *Deleted

## 2020-09-12 LAB — BODY FLUID CULTURE W GRAM STAIN
Culture: NO GROWTH
Gram Stain: NONE SEEN

## 2020-09-12 NOTE — Telephone Encounter (Signed)
Contacted patient via telephone to inform her that Dr. Mike Gip is no longer with the [facility]. Advised patient that she could keep her scheduled appointment in [place] and see the NP, or she can reschedule her appointment and see an MD in [place]. Patient stated that she would like to stay in [facility] and she is fine with seeing the NP.

## 2020-09-16 ENCOUNTER — Other Ambulatory Visit: Payer: Self-pay

## 2020-09-16 ENCOUNTER — Other Ambulatory Visit: Payer: Medicare HMO

## 2020-09-16 ENCOUNTER — Inpatient Hospital Stay: Payer: Medicare HMO

## 2020-09-16 ENCOUNTER — Other Ambulatory Visit: Payer: Self-pay | Admitting: Oncology

## 2020-09-16 ENCOUNTER — Ambulatory Visit: Payer: Medicare HMO

## 2020-09-16 ENCOUNTER — Encounter: Payer: Self-pay | Admitting: Oncology

## 2020-09-16 ENCOUNTER — Inpatient Hospital Stay: Payer: Medicare HMO | Admitting: Oncology

## 2020-09-16 DIAGNOSIS — Z5112 Encounter for antineoplastic immunotherapy: Secondary | ICD-10-CM | POA: Diagnosis not present

## 2020-09-16 DIAGNOSIS — C8219 Follicular lymphoma grade II, extranodal and solid organ sites: Secondary | ICD-10-CM

## 2020-09-16 LAB — CBC WITH DIFFERENTIAL/PLATELET
Abs Immature Granulocytes: 0.28 10*3/uL — ABNORMAL HIGH (ref 0.00–0.07)
Basophils Absolute: 0 10*3/uL (ref 0.0–0.1)
Basophils Relative: 0 %
Eosinophils Absolute: 0 10*3/uL (ref 0.0–0.5)
Eosinophils Relative: 0 %
HCT: 24.7 % — ABNORMAL LOW (ref 36.0–46.0)
Hemoglobin: 8.2 g/dL — ABNORMAL LOW (ref 12.0–15.0)
Immature Granulocytes: 3 %
Lymphocytes Relative: 17 %
Lymphs Abs: 1.8 10*3/uL (ref 0.7–4.0)
MCH: 31.5 pg (ref 26.0–34.0)
MCHC: 33.2 g/dL (ref 30.0–36.0)
MCV: 95 fL (ref 80.0–100.0)
Monocytes Absolute: 1.6 10*3/uL — ABNORMAL HIGH (ref 0.1–1.0)
Monocytes Relative: 16 %
Neutro Abs: 6.6 10*3/uL (ref 1.7–7.7)
Neutrophils Relative %: 64 %
Platelets: 134 10*3/uL — ABNORMAL LOW (ref 150–400)
RBC: 2.6 MIL/uL — ABNORMAL LOW (ref 3.87–5.11)
RDW: 19.5 % — ABNORMAL HIGH (ref 11.5–15.5)
WBC: 10.4 10*3/uL (ref 4.0–10.5)
nRBC: 0 % (ref 0.0–0.2)

## 2020-09-16 LAB — COMPREHENSIVE METABOLIC PANEL
ALT: 16 U/L (ref 0–44)
AST: 23 U/L (ref 15–41)
Albumin: 3.8 g/dL (ref 3.5–5.0)
Alkaline Phosphatase: 78 U/L (ref 38–126)
Anion gap: 5 (ref 5–15)
BUN: 9 mg/dL (ref 8–23)
CO2: 29 mmol/L (ref 22–32)
Calcium: 9.3 mg/dL (ref 8.9–10.3)
Chloride: 100 mmol/L (ref 98–111)
Creatinine, Ser: 0.66 mg/dL (ref 0.44–1.00)
GFR, Estimated: >60 mL/min (ref >60)
Glucose, Bld: 99 mg/dL (ref 70–99)
Potassium: 3.9 mmol/L (ref 3.5–5.1)
Sodium: 134 mmol/L — ABNORMAL LOW (ref 135–145)
Total Bilirubin: 0.4 mg/dL (ref 0.3–1.2)
Total Protein: 6.8 g/dL (ref 6.5–8.1)

## 2020-09-16 LAB — LACTATE DEHYDROGENASE: LDH: 171 U/L (ref 98–192)

## 2020-09-16 LAB — URIC ACID: Uric Acid, Serum: 2.7 mg/dL (ref 2.5–7.1)

## 2020-09-16 MED ORDER — SODIUM CHLORIDE 0.9 % IV SOLN
Freq: Once | INTRAVENOUS | Status: DC
  Filled 2020-09-16: qty 250

## 2020-09-16 MED ORDER — SODIUM CHLORIDE 0.9 % IV SOLN
150.0000 mg | Freq: Once | INTRAVENOUS | Status: AC
  Administered 2020-09-16: 150 mg via INTRAVENOUS
  Filled 2020-09-16: qty 5

## 2020-09-16 MED ORDER — ACETAMINOPHEN 325 MG PO TABS
650.0000 mg | ORAL_TABLET | Freq: Once | ORAL | Status: AC
  Administered 2020-09-16: 650 mg via ORAL

## 2020-09-16 MED ORDER — SODIUM CHLORIDE 0.9 % IV SOLN
10.0000 mg | Freq: Once | INTRAVENOUS | Status: AC
  Administered 2020-09-16: 10 mg via INTRAVENOUS
  Filled 2020-09-16: qty 1

## 2020-09-16 MED ORDER — PALONOSETRON HCL INJECTION 0.25 MG/5ML
0.2500 mg | Freq: Once | INTRAVENOUS | Status: AC
Start: 2020-09-16 — End: 2020-09-16
  Administered 2020-09-16: 0.25 mg via INTRAVENOUS

## 2020-09-16 MED ORDER — DOXORUBICIN HCL CHEMO IV INJECTION 2 MG/ML
50.0000 mg/m2 | Freq: Once | INTRAVENOUS | Status: AC
Start: 2020-09-16 — End: 2020-09-16
  Administered 2020-09-16: 92 mg via INTRAVENOUS
  Filled 2020-09-16: qty 46

## 2020-09-16 MED ORDER — PALONOSETRON HCL INJECTION 0.25 MG/5ML
0.2500 mg | Freq: Once | INTRAVENOUS | Status: DC
  Filled 2020-09-16: qty 5

## 2020-09-16 MED ORDER — SODIUM CHLORIDE 0.9 % IV SOLN
375.0000 mg/m2 | Freq: Once | INTRAVENOUS | Status: AC
  Administered 2020-09-16: 700 mg via INTRAVENOUS
  Filled 2020-09-16: qty 50

## 2020-09-16 MED ORDER — DIPHENHYDRAMINE HCL 25 MG PO CAPS
50.0000 mg | ORAL_CAPSULE | Freq: Once | ORAL | Status: DC
  Filled 2020-09-16: qty 2

## 2020-09-16 MED ORDER — SODIUM CHLORIDE 0.9 % IV SOLN
1380.0000 mg | Freq: Once | INTRAVENOUS | Status: AC
Start: 2020-09-16 — End: 2020-09-16
  Administered 2020-09-16: 1380 mg via INTRAVENOUS
  Filled 2020-09-16: qty 50

## 2020-09-16 MED ORDER — ACETAMINOPHEN 325 MG PO TABS
650.0000 mg | ORAL_TABLET | Freq: Once | ORAL | Status: DC
  Filled 2020-09-16: qty 2

## 2020-09-16 MED ORDER — SODIUM CHLORIDE 0.9 % IV SOLN
10.0000 mg | Freq: Once | INTRAVENOUS | Status: DC
  Filled 2020-09-16: qty 1

## 2020-09-16 MED ORDER — DIPHENHYDRAMINE HCL 25 MG PO CAPS
50.0000 mg | ORAL_CAPSULE | Freq: Once | ORAL | Status: AC
Start: 2020-09-16 — End: 2020-09-16
  Administered 2020-09-16: 50 mg via ORAL

## 2020-09-16 MED ORDER — DOXORUBICIN HCL CHEMO IV INJECTION 2 MG/ML
50.0000 mg/m2 | Freq: Once | INTRAVENOUS | Status: DC
  Filled 2020-09-16: qty 46

## 2020-09-16 MED ORDER — VINCRISTINE SULFATE CHEMO INJECTION 1 MG/ML
2.0000 mg | Freq: Once | INTRAVENOUS | Status: AC
Start: 2020-09-16 — End: 2020-09-16
  Administered 2020-09-16: 2 mg via INTRAVENOUS
  Filled 2020-09-16: qty 2

## 2020-09-16 MED ORDER — SODIUM CHLORIDE 0.9 % IV SOLN
375.0000 mg/m2 | Freq: Once | INTRAVENOUS | Status: DC
  Filled 2020-09-16: qty 70

## 2020-09-16 MED ORDER — SODIUM CHLORIDE 0.9 % IV SOLN
750.0000 mg/m2 | Freq: Once | INTRAVENOUS | Status: DC
  Filled 2020-09-16: qty 69

## 2020-09-16 MED ORDER — ACETAMINOPHEN 325 MG PO TABS: 650.0000 mg | ORAL_TABLET | Freq: Once | ORAL | Status: DC

## 2020-09-16 MED ORDER — PREDNISONE 20 MG PO TABS: 100.0000 mg | ORAL_TABLET | Freq: Every day | ORAL | 5 refills | Status: DC

## 2020-09-16 MED ORDER — SODIUM CHLORIDE 0.9 % IV SOLN
150.0000 mg | Freq: Once | INTRAVENOUS | Status: DC
  Filled 2020-09-16: qty 5

## 2020-09-16 MED ORDER — SODIUM CHLORIDE 0.9 % IV SOLN
INTRAVENOUS | Status: DC | PRN
  Filled 2020-09-16: qty 250

## 2020-09-16 MED ORDER — VINCRISTINE SULFATE CHEMO INJECTION 1 MG/ML
2.0000 mg | Freq: Once | INTRAVENOUS | Status: DC
  Filled 2020-09-16: qty 2

## 2020-09-16 NOTE — Progress Notes (Signed)
[facility]  [address], [address] [place], [place] [address] Phone: [phone]  Fax: [phone]   Clinic Day:  09/16/2020  Referring physician: [name], MD  Chief Complaint: Natasha Farrell is a 75 y.o. female with grade II stage IVB follicular lymphoma who is seen for assessment on day 14 s/p cycle #1 RCHOP.  HPI:  The patient was last seen in the medical oncology clinic on 09/09/2020.  At that time,she had been "ok". She felt better and was less short of breath after her thoracentesis.  Admitted to sleeping much better.    She underwent ultrasound guided thoracentesis on 09/08/2020.  1.7 liters of bloody pleural fluid was removed. She received 1 unit of platelets prior to the procedure to ensure a platelet count > 50,000.   During the interim, she has been doing well.  She continues to have a cough that is worse at bedtime.  Nonproductive.  She is not sleeping well.  She averages about 5 hours nightly.  Appetite is stable.  Endorses a syncopal episode this past Saturday where she slid down a wall.  Annalee Genta states that she was arousable but "out of it" for about 45 minutes.  He checked her blood pressure and oxygen levels which were both normal.  He called EMS and she was checked out.  She did not require transfer to emergency room.  No additional episodes since.  She takes sodium tablets BID, vitamin Q59, and folic acid. She is still using Nystatin swish and spit; she does not have any thrush this morning. She has not taken any nausea medication.   Past Medical History:  Diagnosis Date  . Hx of hysterectomy   . Hyperlipidemia   . Hypertension   . Thyroid disease    rt thryroidectomy  09/25/2010    Past Surgical History:  Procedure Laterality Date  . BREAST BIOPSY Right 04/21/2020   hydromark 3 [place] bx path pending  . LYMPH NODE BIOPSY Left 08/01/2020   Procedure: LYMPH NODE BIOPSY;  Surgeon: Robert Bellow, MD;  Location: [facility] ORS;   Service: General;  Laterality: Left;  . PORTACATH PLACEMENT Left 08/22/2020   Procedure: INSERTION PORT-A-CATH;  Surgeon: Robert Bellow, MD;  Location: [facility] ORS;  Service: General;  Laterality: Left;    Family History  Problem Relation Age of Onset  . Prostate cancer Father   . Breast cancer Sister 38  . Colon cancer Sister   . Kidney cancer Sister     Social History:  reports that she has never smoked. She has never used smokeless tobacco. She reports that she does not drink alcohol and does not use drugs. She does not use alcohol or drugs. She denies exposure to radiation or toxins. She is a retired Librarian, academic. Her husband's name is Josph Macho. The patient is accompanied by Josph Macho today.  Allergies:  Allergies  Allergen Reactions  . Lisinopril Swelling    angioedema    Current Medications: Current Outpatient Medications  Medication Sig Dispense Refill  . acetaminophen (TYLENOL) 500 MG tablet Take 500 mg by mouth every 6 (six) hours as needed for moderate pain, fever or headache.    . allopurinol (ZYLOPRIM) 300 MG tablet Take 1 tablet (300 mg total) by mouth daily. 30 tablet 2  . benzonatate (TESSALON) 100 MG capsule Take 1 capsule (100 mg total) by mouth 3 (three) times daily as needed for cough. 30 capsule 0  . carvedilol (COREG) 25 MG tablet Take 1 tablet (25 mg  total) by mouth 2 (two) times daily. 180 tablet 3  . cholecalciferol (VITAMIN D3) 25 MCG (1000 UNIT) tablet Take 2,000 Units by mouth daily.    . cyanocobalamin 1000 MCG tablet Take 1,000 mcg by mouth daily.    . feeding supplement (ENSURE ENLIVE / ENSURE PLUS) LIQD Take 237 mLs by mouth 2 (two) times daily between meals. 914 mL 12  . folic acid (FOLVITE) 1 MG tablet Take 1 tablet (1 mg total) by mouth daily. 30 tablet 1  . guaiFENesin-codeine 100-10 MG/5ML syrup Take 5 mLs by mouth 3 (three) times daily as needed for cough. 120 mL 0  . levothyroxine (SYNTHROID) 50 MCG tablet Take 50 mcg by mouth daily before breakfast.    .  lidocaine-prilocaine (EMLA) cream Apply 1 application topically as needed. Apply small amount to port site at least 1 hour prior to it being accessed, cover with plastic wrap 30 g 1  . loratadine (CLARITIN) 10 MG tablet Take 10 mg by mouth daily as needed for allergies.    . Multiple Vitamin (MULTIVITAMIN WITH MINERALS) TABS tablet Take 1 tablet by mouth daily.    Marland Kitchen nystatin (MYCOSTATIN) 100000 UNIT/ML suspension Take 5 mLs (500,000 Units total) by mouth 4 (four) times daily. Swish and spit 60 mL 0  . omeprazole (PRILOSEC) 20 MG capsule Take 1 capsule (20 mg total) by mouth daily. 30 capsule 3  . ondansetron (ZOFRAN) 8 MG tablet Take 1 tablet (8 mg total) by mouth every 8 (eight) hours as needed for nausea or vomiting. 20 tablet 3  . pravastatin (PRAVACHOL) 40 MG tablet Take 40 mg by mouth daily.    . predniSONE (DELTASONE) 20 MG tablet Take 5 tablets (100 mg total) by mouth daily. Take with food on days 1-5 of chemotherapy. (Patient not taking: No sig reported) 25 tablet 5  . prochlorperazine (COMPAZINE) 10 MG tablet Take 1 tablet (10 mg total) by mouth every 6 (six) hours as needed (Nausea or vomiting). 30 tablet 6  . sodium chloride 1 g tablet Take 1 tablet (1 g total) by mouth 2 (two) times daily with a meal. 60 tablet 0  . spironolactone (ALDACTONE) 50 MG tablet Take 0.5 tablet (25 mg) by mouth once daily      No current facility-administered medications for this visit.    Review of Systems  Constitutional: Negative.  Negative for chills, fever, malaise/fatigue and weight loss.  HENT: Negative for congestion, ear pain and tinnitus.   Eyes: Negative.  Negative for blurred vision and double vision.  Respiratory: Positive for cough. Negative for sputum production and shortness of breath.   Cardiovascular: Negative.  Negative for chest pain, palpitations and leg swelling.  Gastrointestinal: Negative.  Negative for abdominal pain, constipation, diarrhea, nausea and vomiting.  Genitourinary:  Negative for dysuria, frequency and urgency.  Musculoskeletal: Positive for falls. Negative for back pain.  Skin: Negative.  Negative for rash.  Neurological: Positive for dizziness. Negative for weakness and headaches.  Endo/Heme/Allergies: Negative.  Does not bruise/bleed easily.  Psychiatric/Behavioral: Negative for depression. The patient has insomnia. The patient is not nervous/anxious.    Performance status (ECOG): 1  Vitals There were no vitals taken for this visit.   Physical Exam Vitals and nursing note reviewed.  Constitutional:      General: She is not in acute distress.    Appearance: She is not diaphoretic.  HENT:     Head: Normocephalic and atraumatic.     Mouth/Throat:     Mouth: Mucous membranes  are moist.     Pharynx: Oropharynx is clear.     Comments: Thrush resolved. Eyes:     General: No scleral icterus.    Extraocular Movements: Extraocular movements intact.     Conjunctiva/sclera: Conjunctivae normal.     Pupils: Pupils are equal, round, and reactive to light.     Comments: Glasses.  Brown eyes.  Cardiovascular:     Rate and Rhythm: Normal rate and regular rhythm.     Heart sounds: Normal heart sounds. No murmur heard.   Pulmonary:     Effort: Pulmonary effort is normal. No respiratory distress.     Breath sounds: Normal breath sounds. No wheezing or rales.  Chest:     Chest wall: No tenderness.  Breasts:     Right: No axillary adenopathy or supraclavicular adenopathy.     Left: No axillary adenopathy or supraclavicular adenopathy.    Abdominal:     General: Bowel sounds are normal. There is no distension.     Palpations: Abdomen is soft. There is hepatomegaly and splenomegaly. There is no mass.     Tenderness: There is no abdominal tenderness. There is no guarding or rebound.     Comments: Decreasing hepatosplenomegaly.  Musculoskeletal:        General: No swelling or tenderness. Normal range of motion.     Cervical back: Normal range of  motion and neck supple.     Right lower leg: No edema.     Left lower leg: No edema.  Lymphadenopathy:     Head:     Right side of head: No submandibular, preauricular, posterior auricular or occipital adenopathy.     Left side of head: No submandibular, preauricular, posterior auricular or occipital adenopathy.     Cervical: No cervical adenopathy.     Upper Body:     Right upper body: No supraclavicular or axillary adenopathy.     Left upper body: No supraclavicular or axillary adenopathy.     Lower Body: No right inguinal adenopathy. No left inguinal adenopathy.  Skin:    General: Skin is warm and dry.  Neurological:     Mental Status: She is alert and oriented to person, place, and time.  Psychiatric:        Behavior: Behavior normal.        Thought Content: Thought content normal.        Judgment: Judgment normal.    No visits with results within 3 Day(s) from this visit.  Latest known visit with results is:  Appointment on 09/09/2020  Component Date Value Ref Range Status  . Sodium 09/09/2020 133* 135 - 145 mmol/L Final  . Potassium 09/09/2020 3.9  3.5 - 5.1 mmol/L Final  . Chloride 09/09/2020 98  98 - 111 mmol/L Final  . CO2 09/09/2020 30  22 - 32 mmol/L Final  . Glucose, Bld 09/09/2020 105* 70 - 99 mg/dL Final   Glucose reference range applies only to samples taken after fasting for at least 8 hours.  . BUN 09/09/2020 10  8 - 23 mg/dL Final  . Creatinine, Ser 09/09/2020 0.63  0.44 - 1.00 mg/dL Final  . Calcium 09/09/2020 9.3  8.9 - 10.3 mg/dL Final  . GFR, Estimated 09/09/2020 >60  >60 mL/min Final   Comment: (NOTE) Calculated using the CKD-EPI Creatinine Equation (2021)   . Anion gap 09/09/2020 5  5 - 15 Final   Performed at [facility], [address], [place], [place] [address]  . Blood Bank  Specimen 09/09/2020 SAMPLE AVAILABLE FOR TESTING   Final  . Sample Expiration 09/09/2020    Final                   Value:09/12/2020,2359 Performed at [facility], [address] [place], [place] [address]   . WBC 09/09/2020 29.7* 4.0 - 10.5 K/uL Final  . RBC 09/09/2020 2.98* 3.87 - 5.11 MIL/uL Final  . Hemoglobin 09/09/2020 9.2* 12.0 - 15.0 g/dL Final  . HCT 09/09/2020 28.2* 36.0 - 46.0 % Final  . MCV 09/09/2020 94.6  80.0 - 100.0 fL Final  . MCH 09/09/2020 30.9  26.0 - 34.0 pg Final  . MCHC 09/09/2020 32.6  30.0 - 36.0 g/dL Final  . RDW 09/09/2020 18.5* 11.5 - 15.5 % Final  . Platelets 09/09/2020 69* 150 - 400 K/uL Final   Comment: Immature Platelet Fraction may be clinically indicated, consider ordering this additional test [id number] CONSISTENT WITH PREVIOUS RESULT   . nRBC 09/09/2020 0.2  0.0 - 0.2 % Final  . Neutrophils Relative % 09/09/2020 61  % Final  . Neutro Abs 09/09/2020 18.0* 1.7 - 7.7 K/uL Final  . Lymphocytes Relative 09/09/2020 10  % Final  . Lymphs Abs 09/09/2020 2.8  0.7 - 4.0 K/uL Final  . Monocytes Relative 09/09/2020 10  % Final  . Monocytes Absolute 09/09/2020 3.0* 0.1 - 1.0 K/uL Final  . Eosinophils Relative 09/09/2020 0  % Final  . Eosinophils Absolute 09/09/2020 0.0  0.0 - 0.5 K/uL Final  . Basophils Relative 09/09/2020 0  % Final  . Basophils Absolute 09/09/2020 0.1  0.0 - 0.1 K/uL Final  . WBC Morphology 09/09/2020 INCREASED BANDS (>20% BANDS)   Final   MARKED LEFT SHIFT (>5% METAS,MYELOS AND PROS, OCC BLAST NOTED)  . RBC Morphology 09/09/2020 MIXED RBC POPULATION   Final  . Smear Review 09/09/2020 Reviewed   Final  . Immature Granulocytes 09/09/2020 19  % Final  . Abs Immature Granulocytes 09/09/2020 5.71* 0.00 - 0.07 K/uL Final   Performed at [facility], [address] [place], [place] [address]  . Path Review 09/09/2020 Blood smear is reviewed.   Final   Comment: Leukocytosis with left shifted maturation.  Diagnostic considerations include infection, medication effects, stress, etc. Normocytic anemia and thrombocytopenia. No evidence of circulating blasts or  schistocytes. Reviewed by Elmon Kirschner, M.D. Performed at [facility], [address] [place], [place] [address]     Assessment:  EUGENIE SAIZ is a 75 y.o. female with grade II stage IVB follicular lymphoma s/p excisional biopsy on 08/01/2020.  She presented with extensive adenopathy and massive splenomegaly.  Right axillary ultrasound guided biopsy on 04/21/2020 revealed fragments of lymph node with reactive follicular hyperplasia. There was no definite evidence of malignancy. Flow cytometry revealed a CD10+ B cell population. Clonality could not be evaluated due to non-specific light chain binding. There was no evidence of a T cell lymphoproliferative disorder.  Chest CT angiogram on 07/21/2020 revealed extensive lymphadenopathy throughout the neck, chest, and visualized portions of the abdomen, along with marked splenomegaly.  Largest right axillary lymph node was 2.1 x 1.6 cm and left axillary lymph node 3.0 x 1.5 cm.  There were enlarged lymph nodes interposed between the brachiocephalic vein and left subclavian measuring 2.1 x 1.6 cm.  Overall, findings favored leukemia or lymphoma. There was no evidence of pulmonary embolus. There was a small left pleural effusion.   Work-up on 07/28/2020 revealed a hematocrit of 36.4,  hemoglobin 12.3, platelets 63,000, WBC 8,500 ([place]inner 5200).  Calcium was 10.1 with an albumin of 4.5. Total protein was 9.0 (6.5-8.1). G6PD assay was 7.7 (normal). Normal studies included: hepatitis B surface antigen, hepatitis B core antibody, hepatitis C antibody, and HIV antibody.  LDH was 182. Uric acid was 9.3 (2.5-7.1); she began allopurinol.  Bone marrow on 08/04/2020 revealed variably cellular bone marrow with trilineage hematopoiesis and involvement by an atypical lymphoid proliferation  The bone marrow core biopsy demonstrated involvement by an atypical lymphoid proliferation with paratrabecular lymphoid aggregates and  patchy infiltration of the bone  marrow space with accompanying fibrosis. The infiltrate was composed of mixed B-cells and T-cells. At least a subset of the B-cells co-expressed CD10 and BCL6 without definitive BCL2 expression. Scattered larger CD20-positive larger B-cells were also present. Flow cytometry showed no definitive atypical B-cell population. No immunophenotypically aberrant T-cell population was identified. There were no increase in blasts. Monocytes showed heterogeneous CD56 expression.  Cytogenetics were normal (69, XX).  PET scan on 08/12/2020 revealed widespread adenopathy, visceral involvement and signs of near diffuse, multifocal bony involvement, distribution most c/w lymphoma, Deauville category 5. There was ileal involvement as well. Peritoneal involvement was suggested as well with nodular changes about the peritoneum but without significant FDG uptake. There was paraspinal soft tissue without frank bony destruction. Scattered areas of sclerosis underestimated the degree of bony involvement. Abdominal ascites may be related to anasarca and or lymphatic congestion with marked enlargement of LEFT-sided pleural effusion and mild enlargement of small RIGHT-sided pleural fluid.  Thoracentesis is scheduled for 08/15/2020.  Echo on 07/24/2020 revealed an EF of 60-65%.  She has had a recurrent left sided pleural effusion.  She underwent thoracentesis on 08/15/2020 (1.6 liters), 08/23/2020 (1.5 liters), and 09/08/2020 (1.7 liters).  She is day 14 s/p RCHOP (08/27/2020) with Udencya support.  She received 2 unit of PRBCs and 1 pheresed platelet transfusion with cycle #1.  She has a family history of malignancy.  Her father died from prostate cancer. Her sister had breast cancer. Another sister had colon cancer and kidney cancer.  She was admitted to [facility] from 08/15/2020 - 08/20/2020 for acute respiratory failure with hypoxia and sepsis. She was felt to have re-expansion pulmonary edema.   She was admitted to [facility] from  08/22/2020 - 08/24/2020. She had increased shortness of breath after her port placement. She underwent thoracentesis for recurrent left pleural effusion.  The patient received the COVID-19 vaccine in 06/2019 and 07/2019. The Moderna Booster was on 03/17/2020.   Symptomatically, she feels "ok".  Cough is persistent and worse at bedtime.  Currently using Robitussin-DM with some relief.  She also started to use inhalers that were prescribed when she was hospitalized which appear to be helping.  She is eating well.  Plan: 2.   Grade II stage IVB follicular lymphoma  -She is status post cycle 1 R-CHOP with Udenyca support.  -She is here today for cycle 2.   - Labs from 09/16/2020 show hemoglobin of 8.2, platelets 134,000 with a normal CMP.  -Proceed with treatment today.  -She will need to RTC in 1 week to repeat labs with possible blood/platelet transfusion.  -Blood products are Leukopore and irradiated  -Recently had their thoracentesis on 09/08/2020.  -No evidence of tumor lysis with cycle 1 of R-CHOP. 3.   Thrombocytopenia  -Labs from 09/16/2020 show platelet count of 134,000  -Etiology secondary to hepatosplenomegaly and myelosuppression status postchemotherapy.  -Platelets are maintained > 20,000 secondary to recurrent hemorrhagic pleural  effusion.  -She received 1 unit of platelets on 09/03/2020 and 09/08/2020.   -She requires platelets  > 50,000 for thoracentesis.  -Platelet count should continue to improve with treatment. 4.   Normocytic anemia  -Labs from 09/16/2020 show hemoglobin of 8.2, MCV 95  -Previous work-up included: Ferritin 271 with an iron sat 25% and a TIBC 330 on 08/20/2020. Folate 5.1 (low) and B12 206 (low) on 08/21/2018 on 08/20/2020.  -She received 2 units of PRBCs on 09/02/2020 for a hemoglobin of 6.7.  -Continue B12 and folate supplementation.  -Check B12 and folate after 1 month of supplementation. 5.   Recurrent left pleural effusion  -Patient has undergone  thoracentesis x 3 (08/15/2020, 08/23/2020, and 09/08/2020).  -Maintain platelets > 20,000 secondary to hemorrhagic pleural fluid.  -Platelets need to be 50,000 for thoracentesis. 6.   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  -Patient on free water restriction.    -Fluids with electrolytes.  -Continue salt tablets 1 gm BID. 7.   Thrush, resolved  -Discuss use of Nystatin swish and spit prophylactically. 8.   Cough  -Currently taking Robitussin-DM and using inhalers from previous hospitalization.  -This appears to be helping her cough mainly at bedtime.  -Seems to have improved since recent thoracentesis.  -Cough aggravated by adenopathy and pleural effusion.   Plan: -Proceed with treatment today (C2D1 R-CHOP). -RTC tomorrow for [employer]. - RTC in 1 week for repeat labs (CBC, CMP, MAG) and possible blood/platelet transfusion. - RTC in 3 weeks for repeat labs (CBC with diff, CMP, LDH, uric acid), and cycle #3 RCHOP with Udencya support.  I discussed the assessment and treatment plan with the patient.  The patient was provided an opportunity to ask questions and all were answered.  The patient agreed with the plan and demonstrated an understanding of the instructions.  The patient was advised to call back if the symptoms worsen or if the condition fails to improve as anticipated.  Greater than 50% was spent in counseling and coordination of care with this patient including but not limited to discussion of the relevant topics above (See A&P) including, but not limited to diagnosis and management of acute and chronic medical conditions.   Faythe Casa, NP 09/16/2020 1:02 PM

## 2020-09-16 NOTE — Patient Instructions (Signed)
[facility]  Discharge Instructions: Thank you for choosing [facility] to provide your oncology and hematology care.  If you have a lab appointment with the [facility], please go directly to the [facility] and check in at the registration area.  Wear comfortable clothing and clothing appropriate for easy access to any Portacath or PICC line.   We strive to give you quality time with your provider. You may need to reschedule your appointment if you arrive late (15 or more minutes).  Arriving late affects you and other patients whose appointments are after yours.  Also, if you miss three or more appointments without notifying the office, you may be dismissed from the clinic at the provider's discretion.      For prescription refill requests, have your pharmacy contact our office and allow 72 hours for refills to be completed.    Today you received the following chemotherapy and/or immunotherapy agents ** Doxorubicin, vincristine, cytoxan and rituxan*      To help prevent nausea and vomiting after your treatment, we encourage you to take your nausea medication as directed.  BELOW ARE SYMPTOMS THAT SHOULD BE REPORTED IMMEDIATELY: . *FEVER GREATER THAN 100.4 F (38 C) OR HIGHER . *CHILLS OR SWEATING . *NAUSEA AND VOMITING THAT IS NOT CONTROLLED WITH YOUR NAUSEA MEDICATION . *UNUSUAL SHORTNESS OF BREATH . *UNUSUAL BRUISING OR BLEEDING . *URINARY PROBLEMS (pain or burning when urinating, or frequent urination) . *BOWEL PROBLEMS (unusual diarrhea, constipation, pain near the anus) . TENDERNESS IN MOUTH AND THROAT WITH OR WITHOUT PRESENCE OF ULCERS (sore throat, sores in mouth, or a toothache) . UNUSUAL RASH, SWELLING OR PAIN  . UNUSUAL VAGINAL DISCHARGE OR ITCHING   Items with * indicate a potential emergency and should be followed up as soon as possible or go to the Emergency Department if any problems should occur.  Please show the CHEMOTHERAPY  ALERT CARD or IMMUNOTHERAPY ALERT CARD at check-in to the Emergency Department and triage nurse.  Should you have questions after your visit or need to cancel or reschedule your appointment, please contact [facility]  [phone] and follow the prompts.  Office hours are 8:00 a.m. to 4:30 p.m. Monday - Friday. Please note that voicemails left after 4:00 p.m. may not be returned until the following business day.  We are closed weekends and major holidays. You have access to a nurse at all times for urgent questions. Please call the main number to the clinic [phone] and follow the prompts.  For any non-urgent questions, you may also contact your provider using MyChart. We now offer e-Visits for anyone 33 and older to request care online for non-urgent symptoms. For details visit [email or web address].   Also download the MyChart app! Go to the app store, search "MyChart", open the app, select [facility], and log in with your MyChart username and password.  Due to Covid, a mask is required upon entering the hospital/clinic. If you do not have a mask, one will be given to you upon arrival. For doctor visits, patients may have 1 support person aged 55 or older with them. For treatment visits, patients cannot have anyone with them due to current Covid guidelines and our immunocompromised population.

## 2020-09-16 NOTE — Progress Notes (Signed)
Pt states she passed out on Sunday night, EMS came out and accessed pt, BP was fine. EMS states she might just need rest. Pt states she was fine and then felt dizzy.

## 2020-09-17 ENCOUNTER — Other Ambulatory Visit: Payer: Self-pay

## 2020-09-17 ENCOUNTER — Inpatient Hospital Stay: Payer: Medicare HMO

## 2020-09-17 DIAGNOSIS — C8219 Follicular lymphoma grade II, extranodal and solid organ sites: Secondary | ICD-10-CM

## 2020-09-17 DIAGNOSIS — Z5112 Encounter for antineoplastic immunotherapy: Secondary | ICD-10-CM | POA: Diagnosis not present

## 2020-09-17 MED ORDER — PEGFILGRASTIM-CBQV 6 MG/0.6ML ~~LOC~~ SOSY
6.0000 mg | PREFILLED_SYRINGE | Freq: Once | SUBCUTANEOUS | Status: AC
  Administered 2020-09-17: 6 mg via SUBCUTANEOUS
  Filled 2020-09-17: qty 0.6

## 2020-09-23 ENCOUNTER — Other Ambulatory Visit: Payer: Self-pay

## 2020-09-23 ENCOUNTER — Inpatient Hospital Stay: Payer: Medicare HMO | Attending: Nurse Practitioner | Admitting: Oncology

## 2020-09-23 DIAGNOSIS — Z5189 Encounter for other specified aftercare: Secondary | ICD-10-CM | POA: Insufficient documentation

## 2020-09-23 DIAGNOSIS — Z5112 Encounter for antineoplastic immunotherapy: Secondary | ICD-10-CM | POA: Diagnosis present

## 2020-09-23 DIAGNOSIS — Z79899 Other long term (current) drug therapy: Secondary | ICD-10-CM | POA: Insufficient documentation

## 2020-09-23 DIAGNOSIS — C8219 Follicular lymphoma grade II, extranodal and solid organ sites: Secondary | ICD-10-CM | POA: Insufficient documentation

## 2020-09-23 DIAGNOSIS — Z5111 Encounter for antineoplastic chemotherapy: Secondary | ICD-10-CM | POA: Insufficient documentation

## 2020-09-23 LAB — COMPREHENSIVE METABOLIC PANEL
ALT: 17 U/L (ref 0–44)
AST: 23 U/L (ref 15–41)
Albumin: 3.9 g/dL (ref 3.5–5.0)
Alkaline Phosphatase: 83 U/L (ref 38–126)
Anion gap: 5 (ref 5–15)
BUN: 11 mg/dL (ref 8–23)
CO2: 31 mmol/L (ref 22–32)
Calcium: 9.5 mg/dL (ref 8.9–10.3)
Chloride: 98 mmol/L (ref 98–111)
Creatinine, Ser: 0.55 mg/dL (ref 0.44–1.00)
GFR, Estimated: >60 mL/min (ref >60)
Glucose, Bld: 86 mg/dL (ref 70–99)
Potassium: 3.9 mmol/L (ref 3.5–5.1)
Sodium: 134 mmol/L — ABNORMAL LOW (ref 135–145)
Total Bilirubin: 0.3 mg/dL (ref 0.3–1.2)
Total Protein: 6.6 g/dL (ref 6.5–8.1)

## 2020-09-23 LAB — CBC WITH DIFFERENTIAL/PLATELET
Abs Immature Granulocytes: 0.18 10*3/uL — ABNORMAL HIGH (ref 0.00–0.07)
Basophils Absolute: 0 10*3/uL (ref 0.0–0.1)
Basophils Relative: 1 %
Eosinophils Absolute: 0 10*3/uL (ref 0.0–0.5)
Eosinophils Relative: 1 %
HCT: 24.6 % — ABNORMAL LOW (ref 36.0–46.0)
Hemoglobin: 8 g/dL — ABNORMAL LOW (ref 12.0–15.0)
Immature Granulocytes: 6 %
Lymphocytes Relative: 31 %
Lymphs Abs: 1 10*3/uL (ref 0.7–4.0)
MCH: 30.9 pg (ref 26.0–34.0)
MCHC: 32.5 g/dL (ref 30.0–36.0)
MCV: 95 fL (ref 80.0–100.0)
Monocytes Absolute: 0.4 10*3/uL (ref 0.1–1.0)
Monocytes Relative: 12 %
Neutro Abs: 1.5 10*3/uL — ABNORMAL LOW (ref 1.7–7.7)
Neutrophils Relative %: 49 %
Platelets: 52 10*3/uL — ABNORMAL LOW (ref 150–400)
RBC Morphology: NONE SEEN
RBC: 2.59 MIL/uL — ABNORMAL LOW (ref 3.87–5.11)
RDW: 19.2 % — ABNORMAL HIGH (ref 11.5–15.5)
WBC: 3.1 10*3/uL — ABNORMAL LOW (ref 4.0–10.5)
nRBC: 0 % (ref 0.0–0.2)

## 2020-09-23 LAB — MAGNESIUM: Magnesium: 1.6 mg/dL — ABNORMAL LOW (ref 1.7–2.4)

## 2020-09-23 LAB — SAMPLE TO BLOOD BANK

## 2020-09-23 NOTE — Progress Notes (Signed)
Hey Natasha Farrell would you mind giving her a call to see how she is feeling?   Her labs from today look pretty good.  I think I had her coming back in 1 week just to check her lab work to make sure she did not need a blood transfusion.  I do not believe that she needs 1.  Wanted to make sure that she is eating and drinking well.

## 2020-09-24 ENCOUNTER — Inpatient Hospital Stay: Payer: Medicare HMO

## 2020-09-25 NOTE — Progress Notes (Signed)
Cardiology Office Note:    Date:  09/26/2020   ID:  Natasha Farrell, DOB 11/22/1945, MRN [id number]  PCP:  [name], MD  [facility] Cardiologist:  Nelva Bush, MD  [facility] Electrophysiologist:  None   Referring MD: [name], MD   Chief Complaint: 1 month follow-up  History of Present Illness:    Natasha Farrell is a 75 y.o. 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   In February 2022 the patient presented to [facility] for tachycardia.  She reported it started in January and she had 4 days of headache, fever, chills, cough, fatigue, night sweats, dizziness.  In the ED CT of the chest showed widespread lymphadenopathy in the neck, chest, and visualized portions of the upper abdomen along with marked splenomegaly, no PE.  Findings were concerning for lymphoma or leukemia.  EKG showed sinus tachycardia.    She was seen by Dr. Saunders Revel 07/23/2020 in the clinic patient reported elevated heart rates, worse with activity.  She also reported occasional chest tightness and shortness of breath.  She had been on metoprolol and escalation was tried although this caused worsening fatigue.  Heart rate was up to 139 bpm.  ER evaluation was recommended, although patient deferred.  An emergent echo was ordered.  Metoprolol was switched to Coreg.  Concern for underlying possible cancer causing symptoms.  Echo 07/24/2020 showed LVEF 60 to 65%, no wall motion abnormality, grade 1 diastolic dysfunction, RV systolic normal function, left pleural effusion.  Patient had biopsy of the lymph nodes, suspect stage III lymphoma.  Patient was seen 08/06/2020 and was doing well from a cardiac standpoint.  Rate was better at 106 bpm on Coreg.  Coreg was increased to 25 mg twice daily.  Seen by surgeon Dr. Tollie Pizza 08/12/2020 with shortness of breath.  Thoracentesis performed on 3/25  yielding 1.6 L pleural fluid.  Patient went back to the hospital at night for worsening shortness of breath.  Chest x-ray showed reexpansion of pulmonary edema versus infiltrate in the left lower lobe.  She was given IV Lasix and discharged.  Port-A-Cath placement for 08/22/20  Patient was last seen 08/29/2020 and volume was stable. Arlyce Harman was decreased for hyponatremia. Amlodipine d/c'd for swelling. H/o angioedema on ACE, cautious with ARB.  Today, the patient reports she is doing great. She is doing chemotherapy and is tolerating this well. Breathing has been good. No swelling on her feet. BP and heart rate are good. She brought bp from home and they are good, mainly 120s/70s. Recent labs showed sodium 134.  She had a syncopal episode 2 weeks ago Sunday. She was sitting and took a nap. She got up and started walking towards her husband. She did not feel dizzy or lightheaded before fall. Vision became dark and she went down to the floor. She lost conciseness completely. Husband came and helped her down to the floor. She did not injur anything. BP was normal that day. No chest pain, sob, palpitation prior to episode. EMS was called and vitals were normal, so she did not go to the Er. She had eaten/drank that day.  She doesn't sleep good, this has been a long time. She will try melatonin.   Had chemo Wednesday and was going to have infusion for anemia, but Hgb came back at 8. Will only transfuse if<7.  Orthostatics today  Past Medical History:  Diagnosis  Date  . Hx of hysterectomy   . Hyperlipidemia   . Hypertension   . Thyroid disease    rt thryroidectomy  09/25/2010    Past Surgical History:  Procedure Laterality Date  . BREAST BIOPSY Right 04/21/2020   hydromark 3 [place] bx path pending  . LYMPH NODE BIOPSY Left 08/01/2020   Procedure: LYMPH NODE BIOPSY;  Surgeon: Robert Bellow, MD;  Location: [facility] ORS;  Service: General;  Laterality: Left;  . PORTACATH PLACEMENT Left 08/22/2020   Procedure:  INSERTION PORT-A-CATH;  Surgeon: Robert Bellow, MD;  Location: [facility] ORS;  Service: General;  Laterality: Left;    Current Medications: Current Meds  Medication Sig  . acetaminophen (TYLENOL) 500 MG tablet Take 500 mg by mouth every 6 (six) hours as needed for moderate pain, fever or headache.  . albuterol (VENTOLIN HFA) 108 (90 Base) MCG/ACT inhaler Inhale 2 puffs into the lungs every 6 (six) hours as needed for wheezing or shortness of breath.  . allopurinol (ZYLOPRIM) 300 MG tablet Take 1 tablet (300 mg total) by mouth daily.  . benzonatate (TESSALON) 100 MG capsule Take 1 capsule (100 mg total) by mouth 3 (three) times daily as needed for cough.  . carvedilol (COREG) 25 MG tablet Take 1 tablet (25 mg total) by mouth 2 (two) times daily.  . cholecalciferol (VITAMIN D3) 25 MCG (1000 UNIT) tablet Take 2,000 Units by mouth daily.  . cyanocobalamin 1000 MCG tablet Take 1,000 mcg by mouth daily.  . feeding supplement (ENSURE ENLIVE / ENSURE PLUS) LIQD Take 237 mLs by mouth 2 (two) times daily between meals.  . folic acid (FOLVITE) 1 MG tablet Take 1 tablet (1 mg total) by mouth daily.  Marland Kitchen guaiFENesin-codeine 100-10 MG/5ML syrup Take 5 mLs by mouth 3 (three) times daily as needed for cough.  Marland Kitchen ipratropium (ATROVENT HFA) 17 MCG/ACT inhaler Inhale 2 puffs into the lungs every 6 (six) hours as needed for wheezing.  Marland Kitchen levothyroxine (SYNTHROID) 50 MCG tablet Take 50 mcg by mouth daily before breakfast.  . lidocaine-prilocaine (EMLA) cream Apply 1 application topically as needed. Apply small amount to port site at least 1 hour prior to it being accessed, cover with plastic wrap  . loratadine (CLARITIN) 10 MG tablet Take 10 mg by mouth daily as needed for allergies.  . Multiple Vitamin (MULTIVITAMIN WITH MINERALS) TABS tablet Take 1 tablet by mouth daily.  Marland Kitchen nystatin (MYCOSTATIN) 100000 UNIT/ML suspension Take 5 mLs by mouth 2 (two) times daily.  Marland Kitchen omeprazole (PRILOSEC) 20 MG capsule Take 1 capsule  (20 mg total) by mouth daily.  . ondansetron (ZOFRAN) 8 MG tablet Take 1 tablet (8 mg total) by mouth every 8 (eight) hours as needed for nausea or vomiting.  . pravastatin (PRAVACHOL) 40 MG tablet Take 40 mg by mouth daily.  . prochlorperazine (COMPAZINE) 10 MG tablet Take 1 tablet (10 mg total) by mouth every 6 (six) hours as needed (Nausea or vomiting).  Marland Kitchen spironolactone (ALDACTONE) 50 MG tablet Take 0.5 tablet (25 mg) by mouth once daily      Allergies:   Lisinopril   Social History   Socioeconomic History  . Marital status: Married    Spouse name: Not on file  . Number of children: Not on file  . Years of education: Not on file  . Highest education level: Not on file  Occupational History  . Not on file  Tobacco Use  . Smoking status: Never Smoker  . Smokeless tobacco: Never Used  Vaping Use  . Vaping Use: Never used  Substance and Sexual Activity  . Alcohol use: No  . Drug use: No  . Sexual activity: Not on file  Other Topics Concern  . Not on file  Social History Narrative  . Not on file   Social Determinants of Health   Financial Resource Strain: Not on file  Food Insecurity: Not on file  Transportation Needs: Not on file  Physical Activity: Not on file  Stress: Not on file  Social Connections: Not on file     Family History: The patient's *family history includes Breast cancer (age of onset: 22) in her sister; Colon cancer in her sister; Kidney cancer in her sister; Prostate cancer in her father.  ROS:   Please see the history of present illness.     All other systems reviewed and are negative.  EKGs/Labs/Other Studies Reviewed:    The following studies were reviewed today:  Echo 07/24/20 1. Left ventricular ejection fraction, by estimation, is 60 to 65%. The  left ventricle has normal function. The left ventricle has no regional  wall motion abnormalities. Left ventricular diastolic parameters are  consistent with Grade I diastolic  dysfunction  (impaired relaxation). The average left ventricular global  longitudinal strain is -11.7 %. The global longitudinal strain is  abnormal.  2. Right ventricular systolic function is normal. The right ventricular  size is normal. There is mildly elevated pulmonary artery systolic  pressure. The estimated right ventricular systolic pressure is 34.1 mmHg.  3. Left pleural effusion noted, 7 cm   EKG:  EKG is  ordered today.  The ekg ordered today demonstrates NSR, 86bpm, no changes  Recent Labs: 08/16/2020: TSH 3.890 08/22/2020: B Natriuretic Peptide 86.4 09/23/2020: ALT 17; BUN 11; Creatinine, Ser 0.55; Hemoglobin 8.0; Magnesium 1.6; Platelets 52; Potassium 3.9; Sodium 134  Recent Lipid Panel No results found for: CHOL, TRIG, HDL, CHOLHDL, VLDL, LDLCALC, LDLDIRECT   Physical Exam:    VS:  BP 128/64 (BP Location: Left Arm, Patient Position: Sitting, Cuff Size: Normal)   Pulse 86   Ht 5\' 4"  (1.626 m)   Wt 161 lb 8 oz (73.3 kg)   SpO2 98%   BMI 27.72 kg/m     Wt Readings from Last 3 Encounters:  09/26/20 161 lb 8 oz (73.3 kg)  09/16/20 161 lb 11.3 oz (73.4 kg)  09/09/20 159 lb 9.8 oz (72.4 kg)     GEN:  Well nourished, well developed in no acute distress HEENT: Normal NECK: No JVD; No carotid bruits LYMPHATICS: No lymphadenopathy CARDIAC: RRR, no murmurs, rubs, gallops RESPIRATORY:  Clear to auscultation without rales, wheezing or rhonchi  ABDOMEN: Soft, non-tender, non-distended MUSCULOSKELETAL:  No edema; No deformity  SKIN: Warm and dry NEUROLOGIC:  Alert and oriented x 3 PSYCHIATRIC:  Normal affect   ASSESSMENT:    1. Syncope and collapse   2. Sinus tachycardia   3. Shortness of breath   4. Essential hypertension   5. Hyponatremia    PLAN:    In order of problems listed above:  Syncope She had syncopal episode 1-2 weeks ago. She was walking and vision tunneled and went black. Husband helped her down, so she did not fall. No prodromal lightheadedness, dizziness, CP,  SOB, palpitations before fall. EMS was called and vitals were normal, she did not go to the ER for work-up. Good PO intake that day. She was tired, and doesn't sleep well in general. Possibly vasovagal vs orthostatic episodes, exacerbated by recent  dx lymphoma on chemo, also with anemia and not sleeping well. At home Bps 120s/70s. Recent labs 5/3 showed improved sodium, otherwise unremarkable. Echo 2 months with normal pump function. EKG today with SR and no changes. I will get a 2 week heart monitor. Orthostatics today were negative. We will see her back after heart monitor.   Hyponatremia Spironolactone decreased at the last visit. Recent labs showed improved sodium 134.   HTN Bps have been well controlled at home. Good today, 128/64. Not orthostatic. Continue current medications.   Sinus tachycardia Resolved. Heart rate 80s today. Continue coreg at current dose  SOB H/o of recurrent pleural effusions s/p thoracentesis x 3 This has resolved. Breathing has been good since the last visit. No orthopnea, pnd, or LLE. Exam unremarkable. O2 today 98%.   Follicular lymphoma Normocytic Anemia/thrombocytopenia Undergoing chemotherapy. Followed closely by oncology. Blood transfusion as needed.  Disposition: Follow up in 4-6 weeks week(s) with APP/MD    Signed, Jocilyn Trego Ninfa Meeker, PA-C  09/26/2020 3:47 PM    [facility]

## 2020-09-26 ENCOUNTER — Encounter: Payer: Self-pay | Admitting: Medical

## 2020-09-26 ENCOUNTER — Ambulatory Visit: Payer: Medicare HMO | Admitting: Medical

## 2020-09-26 ENCOUNTER — Other Ambulatory Visit: Payer: Self-pay

## 2020-09-26 ENCOUNTER — Ambulatory Visit (INDEPENDENT_AMBULATORY_CARE_PROVIDER_SITE_OTHER): Payer: Medicare HMO

## 2020-09-26 VITALS — BP 128/64 | HR 86 | Ht 64.0 in | Wt 161.5 lb

## 2020-09-26 DIAGNOSIS — R55 Syncope and collapse: Secondary | ICD-10-CM

## 2020-09-26 DIAGNOSIS — E871 Hypo-osmolality and hyponatremia: Secondary | ICD-10-CM

## 2020-09-26 DIAGNOSIS — R0602 Shortness of breath: Secondary | ICD-10-CM | POA: Diagnosis not present

## 2020-09-26 DIAGNOSIS — I10 Essential (primary) hypertension: Secondary | ICD-10-CM

## 2020-09-26 DIAGNOSIS — C8219 Follicular lymphoma grade II, extranodal and solid organ sites: Secondary | ICD-10-CM

## 2020-09-26 DIAGNOSIS — R000 Tachycardia, unspecified: Secondary | ICD-10-CM

## 2020-09-26 NOTE — Addendum Note (Signed)
Addended by: Raelene Bott, Lamoine Fredricksen L on: 09/26/2020 04:08 PM   Modules accepted: Orders

## 2020-09-26 NOTE — Patient Instructions (Signed)
Medication Instructions:  Your physician recommends that you continue on your current medications as directed. Please refer to the Current Medication list given to you today.  *If you need a refill on your cardiac medications before your next appointment, please call your pharmacy*   Lab Work: None ordered  If you have labs (blood work) drawn today and your tests are completely normal, you will receive your results only by: Marland Kitchen MyChart Message (if you have MyChart) OR . A paper copy in the mail If you have any lab test that is abnormal or we need to change your treatment, we will call you to review the results.   Testing/Procedures: Your physician has recommended that you wear a Zio monitor.   This monitor is a medical device that records the heart's electrical activity. Doctors most often use these monitors to diagnose arrhythmias. Arrhythmias are problems with the speed or rhythm of the heartbeat. The monitor is a small device applied to your chest. You can wear one while you do your normal daily activities. While wearing this monitor if you have any symptoms to push the button and record what you felt. Once you have worn this monitor for the period of time provider prescribed (Usually 14 days), you will return the monitor device in the postage paid box. Once it is returned they will download the data collected and provide [place] with a report which the provider will then review and we will call you with those results. Important tips:  1. Avoid showering during the first 24 hours of wearing the monitor. 2. Avoid excessive sweating to help maximize wear time. 3. Do not submerge the device, no hot tubs, and no swimming pools. 4. Keep any lotions or oils away from the patch. 5. After 24 hours you may shower with the patch on. Take brief showers with your back facing the shower head.  6. Do not remove patch once it has been placed because that will interrupt data and decrease adhesive wear  time. 7. Push the button when you have any symptoms and write down what you were feeling. 8. Once you have completed wearing your monitor, remove and place into box which has postage paid and place in your outgoing mailbox.  9. If for some reason you have misplaced your box then call our office and we can provide another box and/or mail it off for you.  Follow-Up: At [facility], you and your health needs are our priority.  As part of our continuing mission to provide you with exceptional heart care, we have created designated Provider Care Teams.  These Care Teams include your primary Cardiologist (physician) and Advanced Practice Providers (APPs -  Physician Assistants and Nurse Practitioners) who all work together to provide you with the care you need, when you need it.  We recommend signing up for the patient portal called "MyChart".  Sign up information is provided on this After Visit Summary.  MyChart is used to connect with patients for Virtual Visits (Telemedicine).  Patients are able to view lab/test results, encounter notes, upcoming appointments, etc.  Non-urgent messages can be sent to your provider as well.   To learn more about what you can do with MyChart, go to [email or web address].    Your next appointment:   4-6 week(s)  The format for your next appointment:   In Person  Provider:   You may see Nelva Bush, MD or one of the following Advanced Practice Providers on your designated Care Team:  Murray Hodgkins, NP  Christell Faith, PA-C  Marrianne Mood, PA-C  Cadence Kathlen Mody, PA-C  Laurann Montana, NP

## 2020-09-29 ENCOUNTER — Other Ambulatory Visit: Payer: Self-pay

## 2020-09-29 ENCOUNTER — Other Ambulatory Visit: Payer: Self-pay | Admitting: Oncology

## 2020-09-29 DIAGNOSIS — E538 Deficiency of other specified B group vitamins: Secondary | ICD-10-CM

## 2020-09-29 MED ORDER — FOLIC ACID 1 MG PO TABS: 1.0000 mg | ORAL_TABLET | Freq: Every day | ORAL | 1 refills | Status: DC

## 2020-09-29 MED ORDER — SODIUM CHLORIDE 1 G PO TABS: 1.0000 g | ORAL_TABLET | Freq: Two times a day (BID) | ORAL | 0 refills | Status: DC

## 2020-10-03 ENCOUNTER — Other Ambulatory Visit: Payer: Self-pay

## 2020-10-03 DIAGNOSIS — C8219 Follicular lymphoma grade II, extranodal and solid organ sites: Secondary | ICD-10-CM

## 2020-10-07 ENCOUNTER — Other Ambulatory Visit: Payer: Self-pay

## 2020-10-07 ENCOUNTER — Inpatient Hospital Stay: Payer: Medicare HMO

## 2020-10-07 ENCOUNTER — Inpatient Hospital Stay (HOSPITAL_BASED_OUTPATIENT_CLINIC_OR_DEPARTMENT_OTHER): Payer: Medicare HMO | Admitting: Nurse Practitioner

## 2020-10-07 VITALS — BP 121/54 | HR 81 | Temp 96.9°F | Resp 18 | Wt 162.9 lb

## 2020-10-07 DIAGNOSIS — D649 Anemia, unspecified: Secondary | ICD-10-CM | POA: Diagnosis not present

## 2020-10-07 DIAGNOSIS — Z5111 Encounter for antineoplastic chemotherapy: Secondary | ICD-10-CM | POA: Diagnosis not present

## 2020-10-07 DIAGNOSIS — Z5112 Encounter for antineoplastic immunotherapy: Secondary | ICD-10-CM | POA: Diagnosis not present

## 2020-10-07 DIAGNOSIS — C8219 Follicular lymphoma grade II, extranodal and solid organ sites: Secondary | ICD-10-CM

## 2020-10-07 DIAGNOSIS — E790 Hyperuricemia without signs of inflammatory arthritis and tophaceous disease: Secondary | ICD-10-CM

## 2020-10-07 LAB — CBC WITH DIFFERENTIAL/PLATELET
Abs Immature Granulocytes: 0.09 10*3/uL — ABNORMAL HIGH (ref 0.00–0.07)
Basophils Absolute: 0 10*3/uL (ref 0.0–0.1)
Basophils Relative: 0 %
Eosinophils Absolute: 0 10*3/uL (ref 0.0–0.5)
Eosinophils Relative: 0 %
HCT: 28.2 % — ABNORMAL LOW (ref 36.0–46.0)
Hemoglobin: 9.6 g/dL — ABNORMAL LOW (ref 12.0–15.0)
Immature Granulocytes: 1 %
Lymphocytes Relative: 17 %
Lymphs Abs: 1.4 10*3/uL (ref 0.7–4.0)
MCH: 32.4 pg (ref 26.0–34.0)
MCHC: 34 g/dL (ref 30.0–36.0)
MCV: 95.3 fL (ref 80.0–100.0)
Monocytes Absolute: 1.4 10*3/uL — ABNORMAL HIGH (ref 0.1–1.0)
Monocytes Relative: 17 %
Neutro Abs: 5.3 10*3/uL (ref 1.7–7.7)
Neutrophils Relative %: 65 %
Platelets: 92 10*3/uL — ABNORMAL LOW (ref 150–400)
RBC: 2.96 MIL/uL — ABNORMAL LOW (ref 3.87–5.11)
RDW: 21.6 % — ABNORMAL HIGH (ref 11.5–15.5)
WBC: 8.1 10*3/uL (ref 4.0–10.5)
nRBC: 0 % (ref 0.0–0.2)

## 2020-10-07 LAB — COMPREHENSIVE METABOLIC PANEL
ALT: 16 U/L (ref 0–44)
AST: 26 U/L (ref 15–41)
Albumin: 4.3 g/dL (ref 3.5–5.0)
Alkaline Phosphatase: 81 U/L (ref 38–126)
Anion gap: 7 (ref 5–15)
BUN: 8 mg/dL (ref 8–23)
CO2: 27 mmol/L (ref 22–32)
Calcium: 9.5 mg/dL (ref 8.9–10.3)
Chloride: 103 mmol/L (ref 98–111)
Creatinine, Ser: 0.87 mg/dL (ref 0.44–1.00)
GFR, Estimated: >60 mL/min (ref >60)
Glucose, Bld: 107 mg/dL — ABNORMAL HIGH (ref 70–99)
Potassium: 3.7 mmol/L (ref 3.5–5.1)
Sodium: 137 mmol/L (ref 135–145)
Total Bilirubin: 0.5 mg/dL (ref 0.3–1.2)
Total Protein: 7.3 g/dL (ref 6.5–8.1)

## 2020-10-07 LAB — LACTATE DEHYDROGENASE: LDH: 156 U/L (ref 98–192)

## 2020-10-07 LAB — URIC ACID: Uric Acid, Serum: 3.5 mg/dL (ref 2.5–7.1)

## 2020-10-07 MED ORDER — VINCRISTINE SULFATE CHEMO INJECTION 1 MG/ML
2.0000 mg | Freq: Once | INTRAVENOUS | Status: AC
  Administered 2020-10-07: 2 mg via INTRAVENOUS
  Filled 2020-10-07 (×2): qty 2

## 2020-10-07 MED ORDER — DIPHENHYDRAMINE HCL 25 MG PO CAPS
50.0000 mg | ORAL_CAPSULE | Freq: Once | ORAL | Status: AC
  Administered 2020-10-07: 50 mg via ORAL
  Filled 2020-10-07: qty 2

## 2020-10-07 MED ORDER — HEPARIN SOD (PORK) LOCK FLUSH 100 UNIT/ML IV SOLN
500.0000 [IU] | Freq: Once | INTRAVENOUS | Status: AC | PRN
  Administered 2020-10-07: 500 [IU]
  Filled 2020-10-07: qty 5

## 2020-10-07 MED ORDER — ACETAMINOPHEN 325 MG PO TABS
650.0000 mg | ORAL_TABLET | Freq: Once | ORAL | Status: AC
Start: 2020-10-07 — End: 2020-10-07
  Administered 2020-10-07: 650 mg via ORAL
  Filled 2020-10-07: qty 2

## 2020-10-07 MED ORDER — ALLOPURINOL 300 MG PO TABS: 300.0000 mg | ORAL_TABLET | Freq: Every day | ORAL | 2 refills | Status: DC

## 2020-10-07 MED ORDER — SODIUM CHLORIDE 0.9 % IV SOLN
375.0000 mg/m2 | Freq: Once | INTRAVENOUS | Status: AC
  Administered 2020-10-07: 700 mg via INTRAVENOUS
  Filled 2020-10-07: qty 70
  Filled 2020-10-07: qty 50

## 2020-10-07 MED ORDER — PALONOSETRON HCL INJECTION 0.25 MG/5ML
0.2500 mg | Freq: Once | INTRAVENOUS | Status: AC
  Administered 2020-10-07: 0.25 mg via INTRAVENOUS
  Filled 2020-10-07: qty 5

## 2020-10-07 MED ORDER — SODIUM CHLORIDE 0.9 % IV SOLN
10.0000 mg | Freq: Once | INTRAVENOUS | Status: AC
  Administered 2020-10-07: 10 mg via INTRAVENOUS
  Filled 2020-10-07: qty 10

## 2020-10-07 MED ORDER — DOXORUBICIN HCL CHEMO IV INJECTION 2 MG/ML
50.0000 mg/m2 | Freq: Once | INTRAVENOUS | Status: AC
  Administered 2020-10-07: 92 mg via INTRAVENOUS
  Filled 2020-10-07 (×2): qty 46

## 2020-10-07 MED ORDER — SODIUM CHLORIDE 0.9 % IV SOLN
750.0000 mg/m2 | Freq: Once | INTRAVENOUS | Status: AC
  Administered 2020-10-07: 1380 mg via INTRAVENOUS
  Filled 2020-10-07: qty 50
  Filled 2020-10-07: qty 69

## 2020-10-07 MED ORDER — SODIUM CHLORIDE 0.9 % IV SOLN
150.0000 mg | Freq: Once | INTRAVENOUS | Status: AC
  Administered 2020-10-07: 150 mg via INTRAVENOUS
  Filled 2020-10-07: qty 5
  Filled 2020-10-07: qty 150

## 2020-10-07 MED ORDER — PREDNISONE 50 MG PO TABS: 100.0000 mg | ORAL_TABLET | Freq: Every day | ORAL | 0 refills | Status: DC

## 2020-10-07 MED ORDER — SODIUM CHLORIDE 0.9 % IV SOLN
Freq: Once | INTRAVENOUS | Status: AC
  Filled 2020-10-07: qty 250

## 2020-10-07 NOTE — Progress Notes (Signed)
Refill for allopurinol, atrovent and alubetrol) Melatonin seems to be helping but does not take it every night. Questioning about beginning prednisone before treatments just like Dr.C had her previously.

## 2020-10-07 NOTE — Progress Notes (Signed)
[facility]  [address] [place], [place] [address] Phone: [phone]  Fax: [phone]   Clinic Day:  10/07/2020  Referring physician: [name], MD  Chief Complaint: Natasha Farrell is a 75 y.o. female with grade II stage IVB follicular lymphoma who is seen for assessment on day 14 s/p cycle #1 RCHOP.  HPI:   Natasha Farrell is a 75 y.o. female with grade II stage IVB follicular lymphoma s/p excisional biopsy on 08/01/2020.  She presented with extensive adenopathy and massive splenomegaly.  Right axillary ultrasound guided biopsy on 04/21/2020 revealed fragments of lymph node with reactive follicular hyperplasia. There was no definite evidence of malignancy. Flow cytometry revealed a CD10+ B cell population. Clonality could not be evaluated due to non-specific light chain binding. There was no evidence of a T cell lymphoproliferative disorder.  Chest CT angiogram on 07/21/2020 revealed extensive lymphadenopathy throughout the neck, chest, and visualized portions of the abdomen, along with marked splenomegaly.  Largest right axillary lymph node was 2.1 x 1.6 cm and left axillary lymph node 3.0 x 1.5 cm.  There were enlarged lymph nodes interposed between the brachiocephalic vein and left subclavian measuring 2.1 x 1.6 cm.  Overall, findings favored leukemia or lymphoma. There was no evidence of pulmonary embolus. There was a small left pleural effusion.   Work-up on 07/28/2020 revealed a hematocrit of 36.4, hemoglobin 12.3, platelets 63,000, WBC 8,500 ([place] 5200).  Calcium was 10.1 with an albumin of 4.5. Total protein was 9.0 (6.5-8.1). G6PD assay was 7.7 (normal). Normal studies included: hepatitis B surface antigen, hepatitis B core antibody, hepatitis C antibody, and HIV antibody.  LDH was 182. Uric acid was 9.3 (2.5-7.1); she began allopurinol.  Bone marrow on 08/04/2020 revealed variably cellular bone marrow with trilineage hematopoiesis and  involvement by an atypical lymphoid proliferation  The bone marrow core biopsy demonstrated involvement by an atypical lymphoid proliferation with paratrabecular lymphoid aggregates and  patchy infiltration of the bone marrow space with accompanying fibrosis. The infiltrate was composed of mixed B-cells and T-cells. At least a subset of the B-cells co-expressed CD10 and BCL6 without definitive BCL2 expression. Scattered larger CD20-positive larger B-cells were also present. Flow cytometry showed no definitive atypical B-cell population. No immunophenotypically aberrant T-cell population was identified. There were no increase in blasts. Monocytes showed heterogeneous CD56 expression.  Cytogenetics were normal (63, XX).  PET scan on 08/12/2020 revealed widespread adenopathy, visceral involvement and signs of near diffuse, multifocal bony involvement, distribution most c/w lymphoma, Deauville category 5. There was ileal involvement as well. Peritoneal involvement was suggested as well with nodular changes about the peritoneum but without significant FDG uptake. There was paraspinal soft tissue without frank bony destruction. Scattered areas of sclerosis underestimated the degree of bony involvement. Abdominal ascites may be related to anasarca and or lymphatic congestion with marked enlargement of LEFT-sided pleural effusion and mild enlargement of small RIGHT-sided pleural fluid.  Thoracentesis is scheduled for 08/15/2020.  Echo on 07/24/2020 revealed an EF of 60-65%.  She has had a recurrent left sided pleural effusion.  She underwent thoracentesis on 08/15/2020 (1.6 liters), 08/23/2020 (1.5 liters), and 09/08/2020 (1.7 liters).  She is day 14 s/p RCHOP (08/27/2020) with Udencya support.  She received 2 unit of PRBCs and 1 pheresed platelet transfusion with cycle #1.  She has a family history of malignancy.  Her father died from prostate cancer. Her sister had breast cancer. Another sister had colon cancer  and kidney cancer.  She was admitted to [facility] from 08/15/2020 - 08/20/2020 for acute respiratory failure with hypoxia and sepsis. She was felt to have re-expansion pulmonary edema.   She was admitted to [facility] from 08/22/2020 - 08/24/2020. She had increased shortness of breath after her port placement. She underwent thoracentesis for recurrent left pleural effusion.  The patient received the COVID-19 vaccine in 06/2019 and 07/2019. The Moderna Booster was on 03/17/2020.   Interval History:      Past Medical History:  Diagnosis Date   Hx of hysterectomy    Hyperlipidemia    Hypertension    Thyroid disease    rt thryroidectomy  09/25/2010    Past Surgical History:  Procedure Laterality Date   BREAST BIOPSY Right 04/21/2020   hydromark 3 [place] bx path pending   LYMPH NODE BIOPSY Left 08/01/2020   Procedure: LYMPH NODE BIOPSY;  Surgeon: Robert Bellow, MD;  Location: [facility] ORS;  Service: General;  Laterality: Left;   PORTACATH PLACEMENT Left 08/22/2020   Procedure: INSERTION PORT-A-CATH;  Surgeon: Robert Bellow, MD;  Location: [facility] ORS;  Service: General;  Laterality: Left;    Family History  Problem Relation Age of Onset   Prostate cancer Father    Breast cancer Sister 70   Colon cancer Sister    Kidney cancer Sister     Social History:  reports that she has never smoked. She has never used smokeless tobacco. She reports that she does not drink alcohol and does not use drugs. She does not use alcohol or drugs. She denies exposure to radiation or toxins. She is a retired Librarian, academic. Her husband's name is Josph Macho. The patient is accompanied by Josph Macho today.  Allergies:  Allergies  Allergen Reactions   Lisinopril Swelling    angioedema    Current Medications: Current Outpatient Medications  Medication Sig Dispense Refill   acetaminophen (TYLENOL) 500 MG tablet Take 500 mg by mouth every 6 (six) hours as needed for moderate pain, fever or headache.     albuterol (VENTOLIN HFA)  108 (90 Base) MCG/ACT inhaler Inhale 2 puffs into the lungs every 6 (six) hours as needed for wheezing or shortness of breath.     allopurinol (ZYLOPRIM) 300 MG tablet Take 1 tablet (300 mg total) by mouth daily. 30 tablet 2   benzonatate (TESSALON) 100 MG capsule Take 1 capsule (100 mg total) by mouth 3 (three) times daily as needed for cough. 30 capsule 0   carvedilol (COREG) 25 MG tablet Take 1 tablet (25 mg total) by mouth 2 (two) times daily. 180 tablet 3   cholecalciferol (VITAMIN D3) 25 MCG (1000 UNIT) tablet Take 2,000 Units by mouth daily.     cyanocobalamin 1000 MCG tablet Take 1,000 mcg by mouth daily.     feeding supplement (ENSURE ENLIVE / ENSURE PLUS) LIQD Take 237 mLs by mouth 2 (two) times daily between meals. 123XX123 mL 12   folic acid (FOLVITE) 1 MG tablet Take 1 tablet (1 mg total) by mouth daily. 30 tablet 1   guaiFENesin-codeine 100-10 MG/5ML syrup Take 5 mLs by mouth 3 (three) times daily as needed for cough. 120 mL 0   ipratropium (ATROVENT HFA) 17 MCG/ACT inhaler Inhale 2 puffs into the lungs every 6 (six) hours as needed for wheezing.     levothyroxine (SYNTHROID) 50 MCG tablet Take 50 mcg by mouth daily before breakfast.     lidocaine-prilocaine (EMLA) cream Apply 1 application topically as needed. Apply small amount to port site at least 1 hour  prior to it being accessed, cover with plastic wrap 30 g 1   loratadine (CLARITIN) 10 MG tablet Take 10 mg by mouth daily as needed for allergies.     metoprolol succinate (TOPROL-XL) 100 MG 24 hr tablet TAKE 1.5 TABLETS BY MOUTH ONCE DAILY.     Multiple Vitamin (MULTIVITAMIN WITH MINERALS) TABS tablet Take 1 tablet by mouth daily.     nystatin (MYCOSTATIN) 100000 UNIT/ML suspension Take 5 mLs by mouth 2 (two) times daily.     omeprazole (PRILOSEC) 20 MG capsule Take 1 capsule (20 mg total) by mouth daily. 30 capsule 3   ondansetron (ZOFRAN) 8 MG tablet Take 1 tablet (8 mg total) by mouth every 8 (eight) hours as needed for nausea or  vomiting. 20 tablet 3   pravastatin (PRAVACHOL) 40 MG tablet Take 40 mg by mouth daily.     prochlorperazine (COMPAZINE) 10 MG tablet Take 1 tablet (10 mg total) by mouth every 6 (six) hours as needed (Nausea or vomiting). 30 tablet 6   sodium chloride 1 g tablet Take 1 tablet (1 g total) by mouth 2 (two) times daily with a meal. 120 tablet 0   spironolactone (ALDACTONE) 50 MG tablet Take 0.5 tablet (25 mg) by mouth once daily      No current facility-administered medications for this visit.   Facility-Administered Medications Ordered in Other Visits  Medication Dose Route Frequency Provider Last Rate Last Admin   acetaminophen (TYLENOL) tablet 650 mg  650 mg Oral Once Lloyd Huger, MD       dexamethasone (DECADRON) 10 mg in sodium chloride 0.9 % 50 mL IVPB  10 mg Intravenous Once Lloyd Huger, MD       diphenhydrAMINE (BENADRYL) capsule 50 mg  50 mg Oral Once Lloyd Huger, MD       fosaprepitant (EMEND) 150 mg in sodium chloride 0.9 % 145 mL IVPB  150 mg Intravenous Once Lloyd Huger, MD       palonosetron (ALOXI) injection 0.25 mg  0.25 mg Intravenous Once Lloyd Huger, MD        Review of Systems  Constitutional:  Negative for chills, fever, malaise/fatigue and weight loss.  HENT:  Negative for hearing loss, nosebleeds, sore throat and tinnitus.   Eyes:  Negative for blurred vision and double vision.  Respiratory:  Negative for cough, hemoptysis, shortness of breath and wheezing.   Cardiovascular:  Negative for chest pain, palpitations and leg swelling.  Gastrointestinal:  Negative for abdominal pain, blood in stool, constipation, diarrhea, melena, nausea and vomiting.  Genitourinary:  Negative for dysuria and urgency.  Musculoskeletal:  Negative for back pain, falls, joint pain and myalgias.  Skin:  Negative for itching and rash.  Neurological:  Negative for dizziness, tingling, sensory change, loss of consciousness, weakness and headaches.   Endo/Heme/Allergies:  Negative for environmental allergies. Does not bruise/bleed easily.  Psychiatric/Behavioral:  Negative for depression. The patient is not nervous/anxious and does not have insomnia.   Performance status (ECOG): 1  Vitals Blood pressure (!) 121/54, pulse 81, temperature (!) 96.9 F (36.1 C), resp. rate 18, weight 162 lb 14.7 oz (73.9 kg), SpO2 99 %.   General: Well-developed, well-nourished, no acute distress. Eyes: Pink conjunctiva, anicteric sclera. Lungs: Clear to auscultation bilaterally.  No audible wheezing or coughing Heart: Regular rate and rhythm.  Abdomen: Soft, nontender, nondistended.  Musculoskeletal: No edema, cyanosis, or clubbing. Neuro: Alert, answering all questions appropriately. Cranial nerves grossly intact. Skin: No rashes or petechiae  noted. Psych: Normal affect.   Infusion on 10/07/2020  Component Date Value Ref Range Status   LDH 10/07/2020 156  98 - 192 U/L Final   Performed at [facility], [address] [place], [place] [address]   Sodium 10/07/2020 137  135 - 145 mmol/L Final   Potassium 10/07/2020 3.7  3.5 - 5.1 mmol/L Final   Chloride 10/07/2020 103  98 - 111 mmol/L Final   CO2 10/07/2020 27  22 - 32 mmol/L Final   Glucose, Bld 10/07/2020 107* 70 - 99 mg/dL Final   Glucose reference range applies only to samples taken after fasting for at least 8 hours.   BUN 10/07/2020 8  8 - 23 mg/dL Final   Creatinine, Ser 10/07/2020 0.87  0.44 - 1.00 mg/dL Final   Calcium 10/07/2020 9.5  8.9 - 10.3 mg/dL Final   Total Protein 10/07/2020 7.3  6.5 - 8.1 g/dL Final   Albumin 10/07/2020 4.3  3.5 - 5.0 g/dL Final   AST 10/07/2020 26  15 - 41 U/L Final   ALT 10/07/2020 16  0 - 44 U/L Final   Alkaline Phosphatase 10/07/2020 81  38 - 126 U/L Final   Total Bilirubin 10/07/2020 0.5  0.3 - 1.2 mg/dL Final   GFR, Estimated 10/07/2020 >60  >60 mL/min Final   Comment: (NOTE) Calculated using the CKD-EPI Creatinine Equation (2021)     Anion gap 10/07/2020 7  5 - 15 Final   Performed at [facility], [address], [place], [place] [address]   Uric Acid, Serum 10/07/2020 3.5  2.5 - 7.1 mg/dL Final   Performed at [facility], [address], [place], [place] [address]   WBC 10/07/2020 8.1  4.0 - 10.5 K/uL Final   RBC 10/07/2020 2.96* 3.87 - 5.11 MIL/uL Final   Hemoglobin 10/07/2020 9.6* 12.0 - 15.0 g/dL Final   HCT 10/07/2020 28.2* 36.0 - 46.0 % Final   MCV 10/07/2020 95.3  80.0 - 100.0 fL Final   MCH 10/07/2020 32.4  26.0 - 34.0 pg Final   MCHC 10/07/2020 34.0  30.0 - 36.0 g/dL Final   RDW 10/07/2020 21.6* 11.5 - 15.5 % Final   Platelets 10/07/2020 92* 150 - 400 K/uL Final   Comment: Immature Platelet Fraction may be clinically indicated, consider ordering this additional test [id number]    nRBC 10/07/2020 0.0  0.0 - 0.2 % Final   Neutrophils Relative % 10/07/2020 65  % Final   Neutro Abs 10/07/2020 5.3  1.7 - 7.7 K/uL Final   Lymphocytes Relative 10/07/2020 17  % Final   Lymphs Abs 10/07/2020 1.4  0.7 - 4.0 K/uL Final   Monocytes Relative 10/07/2020 17  % Final   Monocytes Absolute 10/07/2020 1.4* 0.1 - 1.0 K/uL Final   Eosinophils Relative 10/07/2020 0  % Final   Eosinophils Absolute 10/07/2020 0.0  0.0 - 0.5 K/uL Final   Basophils Relative 10/07/2020 0  % Final   Basophils Absolute 10/07/2020 0.0  0.0 - 0.1 K/uL Final   Immature Granulocytes 10/07/2020 1  % Final   Abs Immature Granulocytes 10/07/2020 0.09* 0.00 - 0.07 K/uL Final   Performed at [facility], [address] [place], [place] [address]    Assessment:  1.   Grade II stage IVB follicular lymphoma  -She is status post cycle 2 R-CHOP with Udenyca support. Tolerating treatment well. No evidence of tumor lysis with cycle 1 of R-CHOP. s/p thoracentesis on 09/08/2020. Labs  today reviewed. Proceed with cycle 3 today   2.   Thrombocytopenia- Etiology secondary to hepatosplenomegaly and myelosuppression status  postchemotherapy. Platelets are maintained > 20,000 secondary to recurrent hemorrhagic pleural effusion. She received 1 unit of platelets on 09/03/2020 and 09/08/2020.  She requires platelets  > 50,000 for thoracentesis. Platelet count should continue to improve with treatment.  3.   Normocytic anemia- s/p 2 units of pRBCs on 09/02/20 for hemoglobin of 6.7. Goal hemoglobin > 8. Continue b12 and folate supplementation. Will check at next visit. Blood products are Leukopore and irradiated. Will continue to check labs 1 week post chemo for possible transfusion.   4.   Recurrent left pleural effusion- asymptomatic today. Monitor.   5.   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 Patient on free water restriction.  Fluids with electrolytes. Continue salt tablets 1 gm BID.  6.   Thrush, resolved  7.   Cough - improved. Likely related to pleural effusion   Plan: -Proceed with treatment today  -RTC tomorrow for Udenyca. - RTC in 1 week for repeat labs (CBC, CMP, MAG) and possible blood/platelet transfusion. - RTC in 3 weeks for repeat labs (CBC with diff, CMP, LDH, uric acid), MD, and cycle #4 RCHOP with Udencya support.  I discussed the assessment and treatment plan with the patient.  The patient was provided an opportunity to ask questions and all were answered.  The patient agreed with the plan and demonstrated an understanding of the instructions.  The patient was advised to call back if the symptoms worsen or if the condition fails to improve as anticipated.  Greater than 50% was spent in counseling and coordination of care with this patient including but not limited to discussion of the relevant topics above (See A&P) including, but not limited to diagnosis and management of acute and chronic medical conditions.   Beckey Rutter, DNP, AGNP-C [place] at [facility]

## 2020-10-07 NOTE — Progress Notes (Signed)
plt 92 ok to treat per NP

## 2020-10-07 NOTE — Patient Instructions (Signed)
[facility] [place] [facility]  Discharge Instructions: Thank you for choosing [facility] to provide your oncology and hematology care.  If you have a lab appointment with the Cancer Center, please go directly to the Cancer Center and check in at the registration area.  Wear comfortable clothing and clothing appropriate for easy access to any Portacath or PICC line.   We strive to give you quality time with your provider. You may need to reschedule your appointment if you arrive late (15 or more minutes).  Arriving late affects you and other patients whose appointments are after yours.  Also, if you miss three or more appointments without notifying the office, you may be dismissed from the clinic at the provider's discretion.      For prescription refill requests, have your pharmacy contact our office and allow 72 hours for refills to be completed.    Today you received the following chemotherapy and/or immunotherapy agents       To help prevent nausea and vomiting after your treatment, we encourage you to take your nausea medication as directed.  BELOW ARE SYMPTOMS THAT SHOULD BE REPORTED IMMEDIATELY: *FEVER GREATER THAN 100.4 F (38 C) OR HIGHER *CHILLS OR SWEATING *NAUSEA AND VOMITING THAT IS NOT CONTROLLED WITH YOUR NAUSEA MEDICATION *UNUSUAL SHORTNESS OF BREATH *UNUSUAL BRUISING OR BLEEDING *URINARY PROBLEMS (pain or burning when urinating, or frequent urination) *BOWEL PROBLEMS (unusual diarrhea, constipation, pain near the anus) TENDERNESS IN MOUTH AND THROAT WITH OR WITHOUT PRESENCE OF ULCERS (sore throat, sores in mouth, or a toothache) UNUSUAL RASH, SWELLING OR PAIN  UNUSUAL VAGINAL DISCHARGE OR ITCHING   Items with * indicate a potential emergency and should be followed up as soon as possible or go to the Emergency Department if any problems should occur.  Please show the CHEMOTHERAPY ALERT CARD or IMMUNOTHERAPY ALERT CARD at check-in to the Emergency  Department and triage nurse.  Should you have questions after your visit or need to cancel or reschedule your appointment, please contact [facility] [place] [facility]  [phone] and follow the prompts.  Office hours are 8:00 a.m. to 4:30 p.m. Monday - Friday. Please note that voicemails left after 4:00 p.m. may not be returned until the following business day.  We are closed weekends and major holidays. You have access to a nurse at all times for urgent questions. Please call the main number to the clinic [phone] and follow the prompts.  For any non-urgent questions, you may also contact your provider using MyChart. We now offer e-Visits for anyone 18 and older to request care online for non-urgent symptoms. For details visit [email or web address].   Also download the MyChart app! Go to the app store, search "MyChart", open the app, select [facility], and log in with your MyChart username and password.  Due to Covid, a mask is required upon entering the hospital/clinic. If you do not have a mask, one will be given to you upon arrival. For doctor visits, patients may have 1 support person aged 18 or older with them. For treatment visits, patients cannot have anyone with them due to current Covid guidelines and our immunocompromised population.  

## 2020-10-08 ENCOUNTER — Inpatient Hospital Stay: Payer: Medicare HMO

## 2020-10-08 VITALS — BP 149/58 | HR 82 | Temp 97.3°F | Resp 18

## 2020-10-08 DIAGNOSIS — Z5112 Encounter for antineoplastic immunotherapy: Secondary | ICD-10-CM | POA: Diagnosis not present

## 2020-10-08 DIAGNOSIS — C8219 Follicular lymphoma grade II, extranodal and solid organ sites: Secondary | ICD-10-CM

## 2020-10-08 MED ORDER — PEGFILGRASTIM-CBQV 6 MG/0.6ML ~~LOC~~ SOSY
6.0000 mg | PREFILLED_SYRINGE | Freq: Once | SUBCUTANEOUS | Status: AC
Start: 2020-10-08 — End: 2020-10-08
  Administered 2020-10-08: 6 mg via SUBCUTANEOUS

## 2020-10-14 ENCOUNTER — Other Ambulatory Visit: Payer: Self-pay

## 2020-10-14 ENCOUNTER — Inpatient Hospital Stay: Payer: Medicare HMO

## 2020-10-14 DIAGNOSIS — C8219 Follicular lymphoma grade II, extranodal and solid organ sites: Secondary | ICD-10-CM

## 2020-10-14 DIAGNOSIS — Z5112 Encounter for antineoplastic immunotherapy: Secondary | ICD-10-CM | POA: Diagnosis not present

## 2020-10-14 DIAGNOSIS — Z5111 Encounter for antineoplastic chemotherapy: Secondary | ICD-10-CM

## 2020-10-14 LAB — COMPREHENSIVE METABOLIC PANEL
ALT: 16 U/L (ref 0–44)
AST: 21 U/L (ref 15–41)
Albumin: 4.1 g/dL (ref 3.5–5.0)
Alkaline Phosphatase: 78 U/L (ref 38–126)
Anion gap: 4 — ABNORMAL LOW (ref 5–15)
BUN: 12 mg/dL (ref 8–23)
CO2: 31 mmol/L (ref 22–32)
Calcium: 9.4 mg/dL (ref 8.9–10.3)
Chloride: 100 mmol/L (ref 98–111)
Creatinine, Ser: 0.68 mg/dL (ref 0.44–1.00)
GFR, Estimated: >60 mL/min (ref >60)
Glucose, Bld: 89 mg/dL (ref 70–99)
Potassium: 3.9 mmol/L (ref 3.5–5.1)
Sodium: 135 mmol/L (ref 135–145)
Total Bilirubin: 0.4 mg/dL (ref 0.3–1.2)
Total Protein: 6.5 g/dL (ref 6.5–8.1)

## 2020-10-14 LAB — CBC WITH DIFFERENTIAL/PLATELET
Abs Immature Granulocytes: 0.05 10*3/uL (ref 0.00–0.07)
Basophils Absolute: 0.1 10*3/uL (ref 0.0–0.1)
Basophils Relative: 2 %
Eosinophils Absolute: 0 10*3/uL (ref 0.0–0.5)
Eosinophils Relative: 1 %
HCT: 26.6 % — ABNORMAL LOW (ref 36.0–46.0)
Hemoglobin: 8.8 g/dL — ABNORMAL LOW (ref 12.0–15.0)
Immature Granulocytes: 1 %
Lymphocytes Relative: 24 %
Lymphs Abs: 0.8 10*3/uL (ref 0.7–4.0)
MCH: 31.3 pg (ref 26.0–34.0)
MCHC: 33.1 g/dL (ref 30.0–36.0)
MCV: 94.7 fL (ref 80.0–100.0)
Monocytes Absolute: 0.1 10*3/uL (ref 0.1–1.0)
Monocytes Relative: 4 %
Neutro Abs: 2.4 10*3/uL (ref 1.7–7.7)
Neutrophils Relative %: 68 %
Platelets: 45 10*3/uL — ABNORMAL LOW (ref 150–400)
RBC Morphology: NONE SEEN
RBC: 2.81 MIL/uL — ABNORMAL LOW (ref 3.87–5.11)
RDW: 20 % — ABNORMAL HIGH (ref 11.5–15.5)
WBC: 3.5 10*3/uL — ABNORMAL LOW (ref 4.0–10.5)
nRBC: 0 % (ref 0.0–0.2)

## 2020-10-14 LAB — MAGNESIUM: Magnesium: 1.7 mg/dL (ref 1.7–2.4)

## 2020-10-14 MED ORDER — HEPARIN SOD (PORK) LOCK FLUSH 100 UNIT/ML IV SOLN
500.0000 [IU] | Freq: Once | INTRAVENOUS | Status: AC
  Administered 2020-10-14: 500 [IU] via INTRAVENOUS
  Filled 2020-10-14: qty 5

## 2020-10-28 ENCOUNTER — Inpatient Hospital Stay: Payer: Medicare HMO

## 2020-10-28 ENCOUNTER — Inpatient Hospital Stay: Payer: Medicare HMO | Admitting: Oncology

## 2020-10-28 ENCOUNTER — Inpatient Hospital Stay: Payer: Medicare HMO | Attending: Hematology and Oncology

## 2020-10-28 ENCOUNTER — Other Ambulatory Visit: Payer: Self-pay | Admitting: Internal Medicine

## 2020-10-28 ENCOUNTER — Encounter: Payer: Self-pay | Admitting: Oncology

## 2020-10-28 VITALS — BP 128/61 | HR 80

## 2020-10-28 VITALS — BP 131/89 | HR 85 | Temp 97.9°F | Resp 16 | Wt 166.5 lb

## 2020-10-28 DIAGNOSIS — C8218 Follicular lymphoma grade II, lymph nodes of multiple sites: Secondary | ICD-10-CM | POA: Insufficient documentation

## 2020-10-28 DIAGNOSIS — Z5111 Encounter for antineoplastic chemotherapy: Secondary | ICD-10-CM | POA: Insufficient documentation

## 2020-10-28 DIAGNOSIS — Z79899 Other long term (current) drug therapy: Secondary | ICD-10-CM | POA: Diagnosis not present

## 2020-10-28 DIAGNOSIS — Z5112 Encounter for antineoplastic immunotherapy: Secondary | ICD-10-CM | POA: Diagnosis present

## 2020-10-28 DIAGNOSIS — C8219 Follicular lymphoma grade II, extranodal and solid organ sites: Secondary | ICD-10-CM

## 2020-10-28 DIAGNOSIS — R161 Splenomegaly, not elsewhere classified: Secondary | ICD-10-CM | POA: Insufficient documentation

## 2020-10-28 DIAGNOSIS — D696 Thrombocytopenia, unspecified: Secondary | ICD-10-CM | POA: Diagnosis not present

## 2020-10-28 DIAGNOSIS — Z5189 Encounter for other specified aftercare: Secondary | ICD-10-CM | POA: Diagnosis not present

## 2020-10-28 LAB — CBC WITH DIFFERENTIAL/PLATELET
Abs Immature Granulocytes: 0.06 10*3/uL (ref 0.00–0.07)
Basophils Absolute: 0 10*3/uL (ref 0.0–0.1)
Basophils Relative: 0 %
Eosinophils Absolute: 0 10*3/uL (ref 0.0–0.5)
Eosinophils Relative: 0 %
HCT: 32.3 % — ABNORMAL LOW (ref 36.0–46.0)
Hemoglobin: 10.8 g/dL — ABNORMAL LOW (ref 12.0–15.0)
Immature Granulocytes: 1 %
Lymphocytes Relative: 17 %
Lymphs Abs: 1.2 10*3/uL (ref 0.7–4.0)
MCH: 31.2 pg (ref 26.0–34.0)
MCHC: 33.4 g/dL (ref 30.0–36.0)
MCV: 93.4 fL (ref 80.0–100.0)
Monocytes Absolute: 1 10*3/uL (ref 0.1–1.0)
Monocytes Relative: 14 %
Neutro Abs: 5 10*3/uL (ref 1.7–7.7)
Neutrophils Relative %: 68 %
Platelets: 118 10*3/uL — ABNORMAL LOW (ref 150–400)
RBC: 3.46 MIL/uL — ABNORMAL LOW (ref 3.87–5.11)
RDW: 19.3 % — ABNORMAL HIGH (ref 11.5–15.5)
WBC: 7.2 10*3/uL (ref 4.0–10.5)
nRBC: 0 % (ref 0.0–0.2)

## 2020-10-28 LAB — COMPREHENSIVE METABOLIC PANEL
ALT: 20 U/L (ref 0–44)
AST: 29 U/L (ref 15–41)
Albumin: 4.6 g/dL (ref 3.5–5.0)
Alkaline Phosphatase: 72 U/L (ref 38–126)
Anion gap: 11 (ref 5–15)
BUN: 7 mg/dL — ABNORMAL LOW (ref 8–23)
CO2: 27 mmol/L (ref 22–32)
Calcium: 9.7 mg/dL (ref 8.9–10.3)
Chloride: 99 mmol/L (ref 98–111)
Creatinine, Ser: 0.88 mg/dL (ref 0.44–1.00)
GFR, Estimated: >60 mL/min (ref >60)
Glucose, Bld: 110 mg/dL — ABNORMAL HIGH (ref 70–99)
Potassium: 3.9 mmol/L (ref 3.5–5.1)
Sodium: 137 mmol/L (ref 135–145)
Total Bilirubin: 0.6 mg/dL (ref 0.3–1.2)
Total Protein: 7.3 g/dL (ref 6.5–8.1)

## 2020-10-28 LAB — LACTATE DEHYDROGENASE: LDH: 148 U/L (ref 98–192)

## 2020-10-28 LAB — URIC ACID: Uric Acid, Serum: 3.1 mg/dL (ref 2.5–7.1)

## 2020-10-28 MED ORDER — HEPARIN SOD (PORK) LOCK FLUSH 100 UNIT/ML IV SOLN
500.0000 [IU] | Freq: Once | INTRAVENOUS | Status: AC | PRN
  Administered 2020-10-28: 500 [IU]
  Filled 2020-10-28: qty 5

## 2020-10-28 MED ORDER — DEXAMETHASONE SODIUM PHOSPHATE 100 MG/10ML IJ SOLN
10.0000 mg | Freq: Once | INTRAMUSCULAR | Status: AC
  Administered 2020-10-28: 10 mg via INTRAVENOUS
  Filled 2020-10-28: qty 10

## 2020-10-28 MED ORDER — SODIUM CHLORIDE 0.9 % IV SOLN
Freq: Once | INTRAVENOUS | Status: AC
  Filled 2020-10-28: qty 250

## 2020-10-28 MED ORDER — ACETAMINOPHEN 325 MG PO TABS
650.0000 mg | ORAL_TABLET | Freq: Once | ORAL | Status: AC
  Administered 2020-10-28: 650 mg via ORAL
  Filled 2020-10-28: qty 2

## 2020-10-28 MED ORDER — DOXORUBICIN HCL CHEMO IV INJECTION 2 MG/ML
50.0000 mg/m2 | Freq: Once | INTRAVENOUS | Status: AC
  Administered 2020-10-28: 92 mg via INTRAVENOUS
  Filled 2020-10-28: qty 46

## 2020-10-28 MED ORDER — FOSAPREPITANT DIMEGLUMINE INJECTION 150 MG
150.0000 mg | Freq: Once | INTRAVENOUS | Status: AC
  Administered 2020-10-28: 150 mg via INTRAVENOUS
  Filled 2020-10-28: qty 150

## 2020-10-28 MED ORDER — PREDNISONE 50 MG PO TABS: 100.0000 mg | ORAL_TABLET | Freq: Every day | ORAL | 0 refills | Status: AC

## 2020-10-28 MED ORDER — SODIUM CHLORIDE 0.9 % IV SOLN
750.0000 mg/m2 | Freq: Once | INTRAVENOUS | Status: AC
  Administered 2020-10-28: 1380 mg via INTRAVENOUS
  Filled 2020-10-28: qty 69

## 2020-10-28 MED ORDER — DIPHENHYDRAMINE HCL 25 MG PO CAPS
50.0000 mg | ORAL_CAPSULE | Freq: Once | ORAL | Status: AC
  Administered 2020-10-28: 50 mg via ORAL
  Filled 2020-10-28: qty 2

## 2020-10-28 MED ORDER — PALONOSETRON HCL INJECTION 0.25 MG/5ML
0.2500 mg | Freq: Once | INTRAVENOUS | Status: AC
  Administered 2020-10-28: 0.25 mg via INTRAVENOUS
  Filled 2020-10-28: qty 5

## 2020-10-28 MED ORDER — SODIUM CHLORIDE 0.9 % IV SOLN
375.0000 mg/m2 | Freq: Once | INTRAVENOUS | Status: AC
  Administered 2020-10-28: 700 mg via INTRAVENOUS
  Filled 2020-10-28: qty 50

## 2020-10-28 MED ORDER — SODIUM CHLORIDE 0.9 % IV SOLN
2.0000 mg | Freq: Once | INTRAVENOUS | Status: AC
  Administered 2020-10-28: 2 mg via INTRAVENOUS
  Filled 2020-10-28: qty 2

## 2020-10-28 NOTE — Patient Instructions (Signed)
[facility] [place] [facility]  Discharge Instructions: Thank you for choosing [facility] to provide your oncology and hematology care.  If you have a lab appointment with the Cancer Center, please go directly to the Cancer Center and check in at the registration area.  Wear comfortable clothing and clothing appropriate for easy access to any Portacath or PICC line.   We strive to give you quality time with your provider. You may need to reschedule your appointment if you arrive late (15 or more minutes).  Arriving late affects you and other patients whose appointments are after yours.  Also, if you miss three or more appointments without notifying the office, you may be dismissed from the clinic at the provider's discretion.      For prescription refill requests, have your pharmacy contact our office and allow 72 hours for refills to be completed.    Today you received the following chemotherapy and/or immunotherapy agents       To help prevent nausea and vomiting after your treatment, we encourage you to take your nausea medication as directed.  BELOW ARE SYMPTOMS THAT SHOULD BE REPORTED IMMEDIATELY: *FEVER GREATER THAN 100.4 F (38 C) OR HIGHER *CHILLS OR SWEATING *NAUSEA AND VOMITING THAT IS NOT CONTROLLED WITH YOUR NAUSEA MEDICATION *UNUSUAL SHORTNESS OF BREATH *UNUSUAL BRUISING OR BLEEDING *URINARY PROBLEMS (pain or burning when urinating, or frequent urination) *BOWEL PROBLEMS (unusual diarrhea, constipation, pain near the anus) TENDERNESS IN MOUTH AND THROAT WITH OR WITHOUT PRESENCE OF ULCERS (sore throat, sores in mouth, or a toothache) UNUSUAL RASH, SWELLING OR PAIN  UNUSUAL VAGINAL DISCHARGE OR ITCHING   Items with * indicate a potential emergency and should be followed up as soon as possible or go to the Emergency Department if any problems should occur.  Please show the CHEMOTHERAPY ALERT CARD or IMMUNOTHERAPY ALERT CARD at check-in to the  Emergency Department and triage nurse.  Should you have questions after your visit or need to cancel or reschedule your appointment, please contact [facility] [place] [facility]  [phone] and follow the prompts.  Office hours are 8:00 a.m. to 4:30 p.m. Monday - Friday. Please note that voicemails left after 4:00 p.m. may not be returned until the following business day.  We are closed weekends and major holidays. You have access to a nurse at all times for urgent questions. Please call the main number to the clinic [phone] and follow the prompts.  For any non-urgent questions, you may also contact your provider using MyChart. We now offer e-Visits for anyone 18 and older to request care online for non-urgent symptoms. For details visit [email or web address].   Also download the MyChart app! Go to the app store, search "MyChart", open the app, select [facility], and log in with your MyChart username and password.  Due to Covid, a mask is required upon entering the hospital/clinic. If you do not have a mask, one will be given to you upon arrival. For doctor visits, patients may have 1 support person aged 18 or older with them. For treatment visits, patients cannot have anyone with them due to current Covid guidelines and our immunocompromised population.  

## 2020-10-28 NOTE — Progress Notes (Signed)
Patient has recent constipation that is relieved with adding Miralax.

## 2020-10-28 NOTE — Progress Notes (Signed)
[facility]  [address] [place], [place] [address] Phone: [phone]  Fax: [phone]   Clinic Day:  10/28/2020  Referring physician: [name], MD  Chief Complaint: ADELINE Farrell is a 75 y.o. female with grade II stage IVB follicular lymphoma who is seen for assessment prior to cycle 4 R-CHOP.  HPI: She was last seen in clinic on 10/07/2020 for cycle 3 R-CHOP.  Appears to tolerate treatment well.  She underwent ultrasound guided thoracentesis on 09/08/2020.  1.7 liters of bloody pleural fluid was removed. She received 1 unit of platelets prior to the procedure to ensure a platelet count > 50,000.   During the interim, she has been doing well.  Admits to constipation.  She is currently taking MiraLAX once per day.  She has a great appetite and feels she has gained weight.  Denies cough today.  She has questions about having some dental work done.  She has 2 teeth that need to be pulled.   Past Medical History:  Diagnosis Date  . Hx of hysterectomy   . Hyperlipidemia   . Hypertension   . Thyroid disease    rt thryroidectomy  09/25/2010    Past Surgical History:  Procedure Laterality Date  . BREAST BIOPSY Right 04/21/2020   hydromark 3 [place] bx path pending  . LYMPH NODE BIOPSY Left 08/01/2020   Procedure: LYMPH NODE BIOPSY;  Surgeon: Robert Bellow, MD;  Location: [facility] ORS;  Service: General;  Laterality: Left;  . PORTACATH PLACEMENT Left 08/22/2020   Procedure: INSERTION PORT-A-CATH;  Surgeon: Robert Bellow, MD;  Location: [facility] ORS;  Service: General;  Laterality: Left;    Family History  Problem Relation Age of Onset  . Prostate cancer Father   . Breast cancer Sister 77  . Colon cancer Sister   . Kidney cancer Sister     Social History:  reports that she has never smoked. She has never used smokeless tobacco. She reports that she does not drink alcohol and does not use drugs. She does not use alcohol or drugs. She  denies exposure to radiation or toxins. She is a retired Librarian, academic. Her husband's name is Josph Macho. The patient is accompanied by Josph Macho today.  Allergies:  Allergies  Allergen Reactions  . Lisinopril Swelling    angioedema    Current Medications: Current Outpatient Medications  Medication Sig Dispense Refill  . acetaminophen (TYLENOL) 500 MG tablet Take 500 mg by mouth every 6 (six) hours as needed for moderate pain, fever or headache.    . albuterol (VENTOLIN HFA) 108 (90 Base) MCG/ACT inhaler Inhale 2 puffs into the lungs every 6 (six) hours as needed for wheezing or shortness of breath.    . allopurinol (ZYLOPRIM) 300 MG tablet Take 1 tablet (300 mg total) by mouth daily. 30 tablet 2  . carvedilol (COREG) 25 MG tablet Take 1 tablet (25 mg total) by mouth 2 (two) times daily. 180 tablet 3  . cholecalciferol (VITAMIN D3) 25 MCG (1000 UNIT) tablet Take 2,000 Units by mouth daily.    . cyanocobalamin 1000 MCG tablet Take 1,000 mcg by mouth daily.    . feeding supplement (ENSURE ENLIVE / ENSURE PLUS) LIQD Take 237 mLs by mouth 2 (two) times daily between meals. 130 mL 12  . folic acid (FOLVITE) 1 MG tablet Take 1 tablet (1 mg total) by mouth daily. 30 tablet 1  . ipratropium (ATROVENT HFA) 17 MCG/ACT inhaler Inhale 2 puffs into the lungs every  6 (six) hours as needed for wheezing.    Marland Kitchen levothyroxine (SYNTHROID) 50 MCG tablet Take 50 mcg by mouth daily before breakfast.    . lidocaine-prilocaine (EMLA) cream Apply 1 application topically as needed. Apply small amount to port site at least 1 hour prior to it being accessed, cover with plastic wrap 30 g 1  . loratadine (CLARITIN) 10 MG tablet Take 10 mg by mouth daily as needed for allergies.    . metoprolol succinate (TOPROL-XL) 100 MG 24 hr tablet TAKE 1.5 TABLETS BY MOUTH ONCE DAILY.    . Multiple Vitamin (MULTIVITAMIN WITH MINERALS) TABS tablet Take 1 tablet by mouth daily.    Marland Kitchen nystatin (MYCOSTATIN) 100000 UNIT/ML suspension Take 5 mLs by mouth  2 (two) times daily.    Marland Kitchen omeprazole (PRILOSEC) 20 MG capsule Take 1 capsule (20 mg total) by mouth daily. 30 capsule 3  . ondansetron (ZOFRAN) 8 MG tablet Take 1 tablet (8 mg total) by mouth every 8 (eight) hours as needed for nausea or vomiting. 20 tablet 3  . polyethylene glycol powder (GLYCOLAX/MIRALAX) 17 GM/SCOOP powder Take by mouth daily as needed.    . pravastatin (PRAVACHOL) 40 MG tablet Take 40 mg by mouth daily.    . prochlorperazine (COMPAZINE) 10 MG tablet Take 1 tablet (10 mg total) by mouth every 6 (six) hours as needed (Nausea or vomiting). 30 tablet 6  . sodium chloride 1 g tablet Take 1 tablet (1 g total) by mouth 2 (two) times daily with a meal. 120 tablet 0  . spironolactone (ALDACTONE) 50 MG tablet Take 0.5 tablet (25 mg) by mouth once daily     . benzonatate (TESSALON) 100 MG capsule Take 1 capsule (100 mg total) by mouth 3 (three) times daily as needed for cough. (Patient not taking: Reported on 10/28/2020) 30 capsule 0  . guaiFENesin-codeine 100-10 MG/5ML syrup Take 5 mLs by mouth 3 (three) times daily as needed for cough. (Patient not taking: Reported on 10/28/2020) 120 mL 0  . predniSONE (DELTASONE) 50 MG tablet Take 2 tablets (100 mg total) by mouth daily for 5 days. Take with food on days 1-5 of chemotherapy 10 tablet 0   No current facility-administered medications for this visit.   Facility-Administered Medications Ordered in Other Visits  Medication Dose Route Frequency Provider Last Rate Last Admin  . cyclophosphamide (CYTOXAN) 1,380 mg in sodium chloride 0.9 % 250 mL chemo infusion  750 mg/m2 (Treatment Plan Recorded) Intravenous Once Cammie Sickle, MD      . DOXOrubicin (ADRIAMYCIN) chemo injection 92 mg  50 mg/m2 (Treatment Plan Recorded) Intravenous Once Charlaine Dalton R, MD      . heparin lock flush 100 unit/mL  500 Units Intracatheter Once PRN Cammie Sickle, MD      . riTUXimab-pvvr (RUXIENCE) 700 mg in sodium chloride 0.9 % 250 mL (2.1875  mg/mL) infusion  375 mg/m2 (Treatment Plan Recorded) Intravenous Once Charlaine Dalton R, MD      . vinCRIStine (ONCOVIN) 2 mg in sodium chloride 0.9 % 50 mL chemo infusion  2 mg Intravenous Once Cammie Sickle, MD        Review of Systems  Constitutional: Negative.  Negative for chills, fever, malaise/fatigue and weight loss.  HENT: Negative for congestion, ear pain and tinnitus.   Eyes: Negative.  Negative for blurred vision and double vision.  Respiratory: Negative.  Negative for cough, sputum production and shortness of breath.   Cardiovascular: Negative.  Negative for chest pain, palpitations and  leg swelling.  Gastrointestinal: Positive for constipation. Negative for abdominal pain, diarrhea, nausea and vomiting.  Genitourinary: Negative for dysuria, frequency and urgency.  Musculoskeletal: Negative for back pain and falls.  Skin: Negative.  Negative for rash.  Neurological: Negative.  Negative for weakness and headaches.  Endo/Heme/Allergies: Negative.  Does not bruise/bleed easily.  Psychiatric/Behavioral: Negative.  Negative for depression. The patient is not nervous/anxious and does not have insomnia.    Performance status (ECOG): 1  Vitals Blood pressure 131/89, pulse 85, temperature 97.9 F (36.6 C), resp. rate 16, weight 166 lb 8 oz (75.5 kg).   Physical Exam Constitutional:      Appearance: She is well-developed.  HENT:     Head: Normocephalic and atraumatic.  Eyes:     Pupils: Pupils are equal, round, and reactive to light.  Cardiovascular:     Rate and Rhythm: Normal rate and regular rhythm.     Heart sounds: No murmur heard.   Pulmonary:     Effort: Pulmonary effort is normal.     Breath sounds: Normal breath sounds. No wheezing.  Abdominal:     General: Bowel sounds are normal. There is no distension.     Palpations: Abdomen is soft. There is no mass.     Tenderness: There is no abdominal tenderness.  Musculoskeletal:        General: Normal  range of motion.     Cervical back: Normal range of motion.  Skin:    General: Skin is warm and dry.  Neurological:     Mental Status: She is alert and oriented to person, place, and time.  Psychiatric:        Behavior: Behavior normal.    Infusion on 10/28/2020  Component Date Value Ref Range Status  . Uric Acid, Serum 10/28/2020 3.1  2.5 - 7.1 mg/dL Final   Performed at [facility], [address] [place], [place] [address]  . LDH 10/28/2020 148  98 - 192 U/L Final   Performed at [facility], [address] [place], [place] [address]  . Sodium 10/28/2020 137  135 - 145 mmol/L Final  . Potassium 10/28/2020 3.9  3.5 - 5.1 mmol/L Final  . Chloride 10/28/2020 99  98 - 111 mmol/L Final  . CO2 10/28/2020 27  22 - 32 mmol/L Final  . Glucose, Bld 10/28/2020 110* 70 - 99 mg/dL Final   Glucose reference range applies only to samples taken after fasting for at least 8 hours.  . BUN 10/28/2020 7* 8 - 23 mg/dL Final  . Creatinine, Ser 10/28/2020 0.88  0.44 - 1.00 mg/dL Final  . Calcium 10/28/2020 9.7  8.9 - 10.3 mg/dL Final  . Total Protein 10/28/2020 7.3  6.5 - 8.1 g/dL Final  . Albumin 10/28/2020 4.6  3.5 - 5.0 g/dL Final  . AST 10/28/2020 29  15 - 41 U/L Final  . ALT 10/28/2020 20  0 - 44 U/L Final  . Alkaline Phosphatase 10/28/2020 72  38 - 126 U/L Final  . Total Bilirubin 10/28/2020 0.6  0.3 - 1.2 mg/dL Final  . GFR, Estimated 10/28/2020 >60  >60 mL/min Final   Comment: (NOTE) Calculated using the CKD-EPI Creatinine Equation (2021)   . Anion gap 10/28/2020 11  5 - 15 Final   Performed at [facility], [address] [place], [place] [address]  . WBC 10/28/2020 7.2  4.0 - 10.5 K/uL Final  . RBC 10/28/2020 3.46* 3.87 - 5.11 MIL/uL Final  . Hemoglobin 10/28/2020  10.8* 12.0 - 15.0 g/dL Final  . HCT 10/28/2020 32.3* 36.0 - 46.0 % Final  . MCV 10/28/2020 93.4  80.0 - 100.0 fL Final  . MCH 10/28/2020 31.2  26.0 - 34.0 pg Final  . MCHC 10/28/2020 33.4  30.0 -  36.0 g/dL Final  . RDW 10/28/2020 19.3* 11.5 - 15.5 % Final  . Platelets 10/28/2020 118* 150 - 400 K/uL Final  . nRBC 10/28/2020 0.0  0.0 - 0.2 % Final  . Neutrophils Relative % 10/28/2020 68  % Final  . Neutro Abs 10/28/2020 5.0  1.7 - 7.7 K/uL Final  . Lymphocytes Relative 10/28/2020 17  % Final  . Lymphs Abs 10/28/2020 1.2  0.7 - 4.0 K/uL Final  . Monocytes Relative 10/28/2020 14  % Final  . Monocytes Absolute 10/28/2020 1.0  0.1 - 1.0 K/uL Final  . Eosinophils Relative 10/28/2020 0  % Final  . Eosinophils Absolute 10/28/2020 0.0  0.0 - 0.5 K/uL Final  . Basophils Relative 10/28/2020 0  % Final  . Basophils Absolute 10/28/2020 0.0  0.0 - 0.1 K/uL Final  . Immature Granulocytes 10/28/2020 1  % Final  . Abs Immature Granulocytes 10/28/2020 0.06  0.00 - 0.07 K/uL Final   Performed at [facility], [address] [place], [place] [address]    Assessment:  Natasha Farrell is a 75 y.o. female with grade II stage IVB follicular lymphoma s/p excisional biopsy on 08/01/2020.  She presented with extensive adenopathy and massive splenomegaly.  Right axillary ultrasound guided biopsy on 04/21/2020 revealed fragments of lymph node with reactive follicular hyperplasia. There was no definite evidence of malignancy. Flow cytometry revealed a CD10+ B cell population. Clonality could not be evaluated due to non-specific light chain binding. There was no evidence of a T cell lymphoproliferative disorder.  Chest CT angiogram on 07/21/2020 revealed extensive lymphadenopathy throughout the neck, chest, and visualized portions of the abdomen, along with marked splenomegaly.  Largest right axillary lymph node was 2.1 x 1.6 cm and left axillary lymph node 3.0 x 1.5 cm.  There were enlarged lymph nodes interposed between the brachiocephalic vein and left subclavian measuring 2.1 x 1.6 cm.  Overall, findings favored leukemia or lymphoma. There was no evidence of pulmonary embolus. There was a small left  pleural effusion.   Work-up on 07/28/2020 revealed a hematocrit of 36.4, hemoglobin 12.3, platelets 63,000, WBC 8,500 ([place] 5200).  Calcium was 10.1 with an albumin of 4.5. Total protein was 9.0 (6.5-8.1). G6PD assay was 7.7 (normal). Normal studies included: hepatitis B surface antigen, hepatitis B core antibody, hepatitis C antibody, and HIV antibody.  LDH was 182. Uric acid was 9.3 (2.5-7.1); she began allopurinol.  Bone marrow on 08/04/2020 revealed variably cellular bone marrow with trilineage hematopoiesis and involvement by an atypical lymphoid proliferation  The bone marrow core biopsy demonstrated involvement by an atypical lymphoid proliferation with paratrabecular lymphoid aggregates and  patchy infiltration of the bone marrow space with accompanying fibrosis. The infiltrate was composed of mixed B-cells and T-cells. At least a subset of the B-cells co-expressed CD10 and BCL6 without definitive BCL2 expression. Scattered larger CD20-positive larger B-cells were also present. Flow cytometry showed no definitive atypical B-cell population. No immunophenotypically aberrant T-cell population was identified. There were no increase in blasts. Monocytes showed heterogeneous CD56 expression.  Cytogenetics were normal (74, XX).  PET scan on 08/12/2020 revealed widespread adenopathy, visceral involvement and signs of near diffuse, multifocal bony involvement, distribution most c/w lymphoma, Deauville category 5. There was  ileal involvement as well. Peritoneal involvement was suggested as well with nodular changes about the peritoneum but without significant FDG uptake. There was paraspinal soft tissue without frank bony destruction. Scattered areas of sclerosis underestimated the degree of bony involvement. Abdominal ascites may be related to anasarca and or lymphatic congestion with marked enlargement of LEFT-sided pleural effusion and mild enlargement of small RIGHT-sided pleural fluid.  Thoracentesis is  scheduled for 08/15/2020.  Echo on 07/24/2020 revealed an EF of 60-65%.  She has had a recurrent left sided pleural effusion.  She underwent thoracentesis on 08/15/2020 (1.6 liters), 08/23/2020 (1.5 liters), and 09/08/2020 (1.7 liters).  She is day 14 s/p RCHOP (08/27/2020) with Udencya support.  She received 2 unit of PRBCs and 1 pheresed platelet transfusion with cycle #1.  She has a family history of malignancy.  Her father died from prostate cancer. Her sister had breast cancer. Another sister had colon cancer and kidney cancer.  She was admitted to [facility] from 08/15/2020 - 08/20/2020 for acute respiratory failure with hypoxia and sepsis. She was felt to have re-expansion pulmonary edema.   She was admitted to [facility] from 08/22/2020 - 08/24/2020. She had increased shortness of breath after her port placement. She underwent thoracentesis for recurrent left pleural effusion.  The patient received the COVID-19 vaccine in 06/2019 and 07/2019. The Moderna Booster was on 03/17/2020.   Plan: Grade II stage IVB follicular lymphoma She is status post cycle 3 R-CHOP with Udenyca support. She is here today for cycle 4 Labs from 10/28/2020 show hemoglobin of 10.8, platelets 118,000 with an unremarkable CMP. Proceed with treatment today. She will return to clinic in 1 week for repeat labs. She would like to stay in [place]. Blood products are Leukopore and irradiated No evidence of tumor lysis.    Thrombocytopenia Labs from 10/28/2020 show platelet count of 118,000. Etiology secondary to hepatosplenomegaly and myelosuppression status postchemotherapy. Maintain platelet count greater than 20,000 secondary to recurrent hemorrhagic pleural effusion.  She requires a platelet count greater than 50,000 for a thoracentesis.  Normocytic anemia Labs from today show a hemoglobin of 10.8, MCV 93.4. Previous work-up included normal iron studies, low folate level and a low B12 level. She received 2 units of  packed red blood cells on 09/02/2020 for hemoglobin of 6.7. She is currently on B12 and folate oral supplements.  Recurrent left pleural effusion She has had several thoracentesis last on 09/08/2020. Maintain platelet count greater than 20,000 secondary to hemorrhagic pleural fluid.  Platelets must be above 50,000 for thoracentesis.  Constipation Continue MiraLAX.  Advised her she can take this twice per day if needed.  Dental work Patient needs two teeth pulled.  They are not causing her pain at this time. I advised patient to hold off on any dental work especially involving extractions until lab work stabilizes.  Plan: Proceed with treatment today (cycle 4 R-CHOP). RTC tomorrow for [employer]. RTC in 1 week for repeat labs. RTC in 3 weeks for repeat labs (CBC with diff, CMP, LDH, uric acid), and cycle #5 RCHOP with Udencya support and to establish care with oncologist.  Patient would like to remain in [place].  I discussed the assessment and treatment plan with the patient.  The patient was provided an opportunity to ask questions and all were answered.  The patient agreed with the plan and demonstrated an understanding of the instructions.  The patient was advised to call back if the symptoms worsen or if the condition fails to improve as anticipated.  Greater than 50% was spent in counseling and coordination of care with this patient including but not limited to discussion of the relevant topics above (See A&P) including, but not limited to diagnosis and management of acute and chronic medical conditions.   Natasha Casa, Natasha Farrell 10/28/2020 11:44 AM

## 2020-10-28 NOTE — Progress Notes (Signed)
Nutrition Assessment   Reason for Assessment:   Patient identified on Malnutrition Screening report for weight loss   ASSESSMENT:  75 year old female with stage IV follicular lymphoma.  Past medical history of HLD, HTN, right thyroidectomy 2021.  Patient receiving chemotherapy.   Met with patient during infusion.  Patient reports that for the first 1-2 months that she found out she had cancer she had poor appetite, felt full quickly.  Reports that after she has had a few treatments appetite has improved.  "It is too good now."  Usually eats oatmeal with walnuts around 7am and coffee, juice and water.  9am will drink ensure. 12:30 will have sandwich or vegetables or soup.  4pm will drink another ensure and 5pm will have sandwich.  Denies any nutrition impact symptoms at this time.    Medications: MVI, Vit D, Vit Z48, zofran, folic acid, prilosec, compazine, salt tabs   Labs: reviewed   Anthropometrics:   Height: 64 inches Weight: 166 lb today, increased 175 lb 08/14/20 BMI: 28  Initial weight loss down to 161 lb 4/26 but recent weight gain   Estimated Energy Needs  Kcals: 2707-8675 Protein: 94-113g Fluid: 1.8 L   NUTRITION DIAGNOSIS: Unintentional weight loss related to cancer as evidenced by poor appetite initially and weight loss down to 161 lb, now improving     INTERVENTION:  Encouraged patient to include protein food at every meal. Provided examples of protein foods.  Encouraged well balanced diet.   MONITORING, EVALUATION, GOAL: weight trends, intake   Next Visit: phone call in about 4-6 weeks  Natasha Farrell, [place], [place] Registered Dietitian [phone] (mobile)

## 2020-10-29 ENCOUNTER — Inpatient Hospital Stay: Payer: Medicare HMO

## 2020-10-29 ENCOUNTER — Other Ambulatory Visit: Payer: Self-pay

## 2020-10-29 DIAGNOSIS — Z5112 Encounter for antineoplastic immunotherapy: Secondary | ICD-10-CM | POA: Diagnosis not present

## 2020-10-29 DIAGNOSIS — C8219 Follicular lymphoma grade II, extranodal and solid organ sites: Secondary | ICD-10-CM

## 2020-10-29 MED ORDER — PEGFILGRASTIM-CBQV 6 MG/0.6ML ~~LOC~~ SOSY
6.0000 mg | PREFILLED_SYRINGE | Freq: Once | SUBCUTANEOUS | Status: AC
  Administered 2020-10-29: 6 mg via SUBCUTANEOUS
  Filled 2020-10-29: qty 0.6

## 2020-11-03 NOTE — Progress Notes (Signed)
Cardiology Office Note:    Date:  11/04/2020   ID:  Natasha Farrell, DOB March 19, 1946, MRN [id number]  PCP:  [name], MD  [facility] Cardiologist:  Nelva Bush, MD  [facility] Electrophysiologist:  None   Referring MD: [name], MD   Chief Complaint: 4-6 week follow-up  History of Present Illness:    Natasha Farrell is a 75 y.o. female with a hx of HTN, HLD, thyroid disease s/p right thyroidectomy 2012, G2 stage IV follicular lymphoma.  In February 2022 the patient presented to [facility] for tachycardia.  She reported it started in January and she had 4 days of headache, fever, chills, cough, fatigue, night sweats, dizziness.  In the ED CT of the chest showed widespread lymphadenopathy in the neck, chest, and visualized portions of the upper abdomen along with marked splenomegaly, no PE.  Findings were concerning for lymphoma or leukemia.  EKG showed sinus tachycardia.     She was seen by Dr. Saunders Revel 07/23/2020 in the clinic patient reported elevated heart rates, worse with activity.  She also reported occasional chest tightness and shortness of breath.  She had been on metoprolol and escalation was tried although this caused worsening fatigue.  Heart rate was up to 139 bpm.  ER evaluation was recommended, although patient deferred.  An emergent echo was ordered.  Metoprolol was switched to Coreg.  Concern for underlying possible cancer causing symptoms.   Echo 07/24/2020 showed LVEF 60 to 65%, no wall motion abnormality, grade 1 diastolic dysfunction, RV systolic normal function, left pleural effusion.   Patient had biopsy of the lymph nodes, suspect stage III lymphoma.   Patient was seen 08/06/2020 and was doing well from a cardiac standpoint.  Rate was better at 106 bpm on Coreg.  Coreg was increased to 25 mg twice daily.   Seen by surgeon Dr. Tollie Pizza 08/12/2020 with shortness of breath.  Thoracentesis performed on 3/25 yielding 1.6 L pleural fluid.  Patient went back  to the hospital at night for worsening shortness of breath.  Chest x-ray showed reexpansion of pulmonary edema versus infiltrate in the left lower lobe.  She was given IV Lasix and discharged.   Port-A-Cath placement for 08/22/20   Patient was last seen 08/29/2020 and volume was stable. Arlyce Harman was decreased for hyponatremia. Amlodipine d/c'd for swelling. H/o angioedema on ACE, cautious with ARB.  She was seen 09/26/20 and was overall doing well. She had 1 syncopal episode, suspected vasovagal in the setting of recent chemo, anemia, poor sleep.  Heart monitor was ordered.   Today, patient reports no further syncopal episodes. She is overall dong well. No chest pain or shortness. Patient is sleeping better after taking melatonin. Overall energy is better. She had blood work again today. Tomorrow she might go for transfusion for platelets. 5th chemo treatment, out of 6. Staying well hydrated. Appetite is good. Heart monitor reviewed.   Past Medical History:  Diagnosis Date   Hx of hysterectomy    Hyperlipidemia    Hypertension    Thyroid disease    rt thryroidectomy  09/25/2010    Past Surgical History:  Procedure Laterality Date   BREAST BIOPSY Right 04/21/2020   hydromark 3 [place] bx path pending   LYMPH NODE BIOPSY Left 08/01/2020   Procedure: LYMPH NODE BIOPSY;  Surgeon: Robert Bellow, MD;  Location: [facility] ORS;  Service: General;  Laterality: Left;   PORTACATH PLACEMENT Left 08/22/2020   Procedure: INSERTION PORT-A-CATH;  Surgeon: Robert Bellow,  MD;  Location: [facility] ORS;  Service: General;  Laterality: Left;    Current Medications: Current Meds  Medication Sig   acetaminophen (TYLENOL) 500 MG tablet Take 500 mg by mouth every 6 (six) hours as needed for moderate pain, fever or headache.   allopurinol (ZYLOPRIM) 300 MG tablet Take 1 tablet (300 mg total) by mouth daily.   carvedilol (COREG) 25 MG tablet Take 1 tablet (25 mg total) by mouth 2 (two) times daily.   cholecalciferol (VITAMIN  D3) 25 MCG (1000 UNIT) tablet Take 2,000 Units by mouth daily.   cyanocobalamin 1000 MCG tablet Take 1,000 mcg by mouth daily.   feeding supplement (ENSURE ENLIVE / ENSURE PLUS) LIQD Take 237 mLs by mouth 2 (two) times daily between meals.   folic acid (FOLVITE) 1 MG tablet TAKE 1 TABLET BY MOUTH EVERY DAY   levothyroxine (SYNTHROID) 50 MCG tablet Take 50 mcg by mouth daily before breakfast.   lidocaine-prilocaine (EMLA) cream Apply 1 application topically as needed. Apply small amount to port site at least 1 hour prior to it being accessed, cover with plastic wrap   loratadine (CLARITIN) 10 MG tablet Take 10 mg by mouth daily as needed for allergies.   Multiple Vitamin (MULTIVITAMIN WITH MINERALS) TABS tablet Take 1 tablet by mouth daily.   omeprazole (PRILOSEC) 20 MG capsule Take 1 capsule (20 mg total) by mouth daily.   ondansetron (ZOFRAN) 8 MG tablet Take 1 tablet (8 mg total) by mouth every 8 (eight) hours as needed for nausea or vomiting.   polyethylene glycol powder (GLYCOLAX/MIRALAX) 17 GM/SCOOP powder Take by mouth daily as needed.   pravastatin (PRAVACHOL) 40 MG tablet Take 40 mg by mouth daily.   prochlorperazine (COMPAZINE) 10 MG tablet Take 1 tablet (10 mg total) by mouth every 6 (six) hours as needed (Nausea or vomiting).   sodium chloride 1 g tablet Take 1 tablet (1 g total) by mouth 2 (two) times daily with a meal.   spironolactone (ALDACTONE) 50 MG tablet Take 0.5 tablet (25 mg) by mouth once daily      Allergies:   Lisinopril   Social History   Socioeconomic History   Marital status: Married    Spouse name: Not on file   Number of children: Not on file   Years of education: Not on file   Highest education level: Not on file  Occupational History   Not on file  Tobacco Use   Smoking status: Never   Smokeless tobacco: Never  Vaping Use   Vaping Use: Never used  Substance and Sexual Activity   Alcohol use: No   Drug use: No   Sexual activity: Not on file  Other  Topics Concern   Not on file  Social History Narrative   Not on file   Social Determinants of Health   Financial Resource Strain: Not on file  Food Insecurity: Not on file  Transportation Needs: Not on file  Physical Activity: Not on file  Stress: Not on file  Social Connections: Not on file     Family History: The patient's family history includes Breast cancer (age of onset: 23) in her sister; Colon cancer in her sister; Kidney cancer in her sister; Prostate cancer in her father.  ROS:   Please see the history of present illness.     All other systems reviewed and are negative.  EKGs/Labs/Other Studies Reviewed:    The following studies were reviewed today:  Echo 07/24/20  1. Left ventricular ejection fraction,  by estimation, is 60 to 65%. The  left ventricle has normal function. The left ventricle has no regional  wall motion abnormalities. Left ventricular diastolic parameters are  consistent with Grade I diastolic  dysfunction (impaired relaxation). The average left ventricular global  longitudinal strain is -11.7 %. The global longitudinal strain is  abnormal.   2. Right ventricular systolic function is normal. The right ventricular  size is normal. There is mildly elevated pulmonary artery systolic  pressure. The estimated right ventricular systolic pressure is 41.6 mmHg.   3. Left pleural effusion noted, 7 cm   Heart Monitor 09/2020  The patient was monitored for 13 days, 17 hours. The predominant rhythm was sinus with an average rate of 86 bpm (range 49-130 bpm and sinus). There were rare PACs and PVCs. A single episode of nonsustained ventricular tachycardia lasting 11 beats was observed with a maximum rate of 148 bpm. There were two atrial runs lasting up to 9 beats with a maximum rate of 150 bpm. No sustained arrhythmia or prolonged pause occurred. There were no patient triggered events.   Predominantly sinus rhythm with rare PACs and PVCs.  Two brief  episodes of PSVT and one run of NSVT were noted.    EKG:  EKG is  ordered today.  The ekg ordered today demonstrates  NSR, 79bpm, no ST/T wave changes  Recent Labs: 08/16/2020: TSH 3.890 08/22/2020: B Natriuretic Peptide 86.4 11/04/2020: ALT 17; BUN 10; Creatinine, Ser 0.56; Hemoglobin 9.9; Magnesium 1.9; Platelets 32; Potassium 4.1; Sodium 135  Recent Lipid Panel No results found for: CHOL, TRIG, HDL, CHOLHDL, VLDL, LDLCALC, LDLDIRECT   Physical Exam:    VS:  BP 130/70 (BP Location: Left Arm, Patient Position: Sitting, Cuff Size: Normal)   Pulse 79   Ht 5\' 4"  (1.626 m)   Wt 169 lb 6 oz (76.8 kg)   SpO2 98%   BMI 29.07 kg/m     Wt Readings from Last 3 Encounters:  11/04/20 169 lb 6 oz (76.8 kg)  10/28/20 166 lb 8 oz (75.5 kg)  10/07/20 162 lb 14.7 oz (73.9 kg)     GEN:  Well nourished, well developed in no acute distress HEENT: Normal NECK: No JVD; No carotid bruits LYMPHATICS: No lymphadenopathy CARDIAC: RRR, no murmurs, rubs, gallops RESPIRATORY:  Clear to auscultation without rales, wheezing or rhonchi  ABDOMEN: Soft, non-tender, non-distended MUSCULOSKELETAL:  No edema; No deformity  SKIN: Warm and dry NEUROLOGIC:  Alert and oriented x 3 PSYCHIATRIC:  Normal affect   ASSESSMENT:    1. Syncope and collapse   2. Essential hypertension   3. Hyponatremia   4. Shortness of breath   5. Follicular lymphoma grade II of extranodal and solid organ sites [facility])    PLAN:    In order of problems listed above:  Syncope No further episodes. Heart monitor reviewed, it showed Predominantly sinus rhythm with rare PACs and PVCs.  Two brief episodes of PSVT and one run of NSVT were noted. She is sleeping better with melatonin. Energy and appetite up. Overall doing much better.  Hyponatemia Spironolactone previously decreased. Recent labs with normal sodium levels.  Sinus tachycardia Resolved. EKG with NSR 79bpm. Continue coreg 25mg  BID.  Recurrent pleural effusions SOB No  worsening shortness of breath. Chronic diffuse breath sounds at bases. No further work-up.  Follicular lymphoma Normocytic Anemia/thrombocytopenia Undergoing chemotherapy, she is on her 5th of 6 chemo round. Followed closely by oncology. Undergoing intermittent transfusions.   Disposition: Follow up in 3 month(s)  with Dr. Saunders Revel    Signed, Brittin Janik Ninfa Meeker, PA-C  11/04/2020 3:51 PM    [place]

## 2020-11-04 ENCOUNTER — Other Ambulatory Visit: Payer: Self-pay

## 2020-11-04 ENCOUNTER — Other Ambulatory Visit: Payer: Medicare HMO

## 2020-11-04 ENCOUNTER — Other Ambulatory Visit: Payer: Self-pay | Admitting: Oncology

## 2020-11-04 ENCOUNTER — Ambulatory Visit (INDEPENDENT_AMBULATORY_CARE_PROVIDER_SITE_OTHER): Payer: Medicare HMO | Admitting: Medical

## 2020-11-04 ENCOUNTER — Encounter: Payer: Self-pay | Admitting: Medical

## 2020-11-04 ENCOUNTER — Inpatient Hospital Stay: Payer: Medicare HMO

## 2020-11-04 VITALS — BP 130/70 | HR 79 | Ht 64.0 in | Wt 169.4 lb

## 2020-11-04 DIAGNOSIS — I10 Essential (primary) hypertension: Secondary | ICD-10-CM

## 2020-11-04 DIAGNOSIS — E871 Hypo-osmolality and hyponatremia: Secondary | ICD-10-CM | POA: Diagnosis not present

## 2020-11-04 DIAGNOSIS — Z5112 Encounter for antineoplastic immunotherapy: Secondary | ICD-10-CM | POA: Diagnosis not present

## 2020-11-04 DIAGNOSIS — C8219 Follicular lymphoma grade II, extranodal and solid organ sites: Secondary | ICD-10-CM

## 2020-11-04 DIAGNOSIS — R0602 Shortness of breath: Secondary | ICD-10-CM

## 2020-11-04 DIAGNOSIS — R55 Syncope and collapse: Secondary | ICD-10-CM | POA: Diagnosis not present

## 2020-11-04 DIAGNOSIS — E538 Deficiency of other specified B group vitamins: Secondary | ICD-10-CM

## 2020-11-04 LAB — CBC WITH DIFFERENTIAL/PLATELET
Abs Immature Granulocytes: 0.14 10*3/uL — ABNORMAL HIGH (ref 0.00–0.07)
Basophils Absolute: 0 10*3/uL (ref 0.0–0.1)
Basophils Relative: 1 %
Eosinophils Absolute: 0.1 10*3/uL (ref 0.0–0.5)
Eosinophils Relative: 3 %
HCT: 29.7 % — ABNORMAL LOW (ref 36.0–46.0)
Hemoglobin: 9.9 g/dL — ABNORMAL LOW (ref 12.0–15.0)
Immature Granulocytes: 6 %
Lymphocytes Relative: 33 %
Lymphs Abs: 0.8 10*3/uL (ref 0.7–4.0)
MCH: 30.7 pg (ref 26.0–34.0)
MCHC: 33.3 g/dL (ref 30.0–36.0)
MCV: 92.2 fL (ref 80.0–100.0)
Monocytes Absolute: 0.1 10*3/uL (ref 0.1–1.0)
Monocytes Relative: 6 %
Neutro Abs: 1.2 10*3/uL — ABNORMAL LOW (ref 1.7–7.7)
Neutrophils Relative %: 51 %
Platelets: 32 10*3/uL — ABNORMAL LOW (ref 150–400)
RBC Morphology: NONE SEEN
RBC: 3.22 MIL/uL — ABNORMAL LOW (ref 3.87–5.11)
RDW: 18.7 % — ABNORMAL HIGH (ref 11.5–15.5)
Smear Review: DECREASED
WBC: 2.3 10*3/uL — ABNORMAL LOW (ref 4.0–10.5)
nRBC: 0 % (ref 0.0–0.2)

## 2020-11-04 LAB — COMPREHENSIVE METABOLIC PANEL
ALT: 17 U/L (ref 0–44)
AST: 23 U/L (ref 15–41)
Albumin: 4.5 g/dL (ref 3.5–5.0)
Alkaline Phosphatase: 86 U/L (ref 38–126)
Anion gap: 5 (ref 5–15)
BUN: 10 mg/dL (ref 8–23)
CO2: 33 mmol/L — ABNORMAL HIGH (ref 22–32)
Calcium: 9.8 mg/dL (ref 8.9–10.3)
Chloride: 97 mmol/L — ABNORMAL LOW (ref 98–111)
Creatinine, Ser: 0.56 mg/dL (ref 0.44–1.00)
GFR, Estimated: >60 mL/min (ref >60)
Glucose, Bld: 102 mg/dL — ABNORMAL HIGH (ref 70–99)
Potassium: 4.1 mmol/L (ref 3.5–5.1)
Sodium: 135 mmol/L (ref 135–145)
Total Bilirubin: 0.6 mg/dL (ref 0.3–1.2)
Total Protein: 7 g/dL (ref 6.5–8.1)

## 2020-11-04 LAB — SAMPLE TO BLOOD BANK

## 2020-11-04 LAB — MAGNESIUM: Magnesium: 1.9 mg/dL (ref 1.7–2.4)

## 2020-11-04 NOTE — Patient Instructions (Addendum)
Medication Instructions:  Please continue your current medication  *If you need a refill on your cardiac medications before your next appointment, please call your pharmacy*   Lab Work: None  Testing/Procedures: None  Follow-Up:  We recommend signing up for the patient portal called "MyChart".  Sign up information is provided on this After Visit Summary.  MyChart is used to connect with patients for Virtual Visits (Telemedicine).  Patients are able to view lab/test results, encounter notes, upcoming appointments, etc.  Non-urgent messages can be sent to your provider as well.   To learn more about what you can do with MyChart, go to [email or web address].    Your next appointment:   3 month(s)  The format for your next appointment:   In Person  Provider:   Cadence Kathlen Mody, PA-C

## 2020-11-05 ENCOUNTER — Ambulatory Visit: Payer: Medicare HMO

## 2020-11-09 ENCOUNTER — Other Ambulatory Visit: Payer: Self-pay | Admitting: *Deleted

## 2020-11-09 DIAGNOSIS — C8219 Follicular lymphoma grade II, extranodal and solid organ sites: Secondary | ICD-10-CM

## 2020-11-19 ENCOUNTER — Ambulatory Visit: Payer: Medicare HMO

## 2020-11-19 ENCOUNTER — Inpatient Hospital Stay: Payer: Medicare HMO

## 2020-11-19 ENCOUNTER — Other Ambulatory Visit: Payer: Self-pay

## 2020-11-19 ENCOUNTER — Inpatient Hospital Stay: Payer: Medicare HMO | Attending: Oncology

## 2020-11-19 ENCOUNTER — Inpatient Hospital Stay (HOSPITAL_BASED_OUTPATIENT_CLINIC_OR_DEPARTMENT_OTHER): Payer: Medicare HMO | Admitting: Oncology

## 2020-11-19 ENCOUNTER — Ambulatory Visit: Payer: Medicare HMO | Admitting: Oncology

## 2020-11-19 ENCOUNTER — Other Ambulatory Visit: Payer: Medicare HMO

## 2020-11-19 VITALS — BP 131/58 | HR 82 | Temp 96.6°F | Resp 20 | Wt 169.4 lb

## 2020-11-19 VITALS — BP 119/72 | HR 77

## 2020-11-19 DIAGNOSIS — Z79620 Long term (current) use of immunosuppressive biologic: Secondary | ICD-10-CM

## 2020-11-19 DIAGNOSIS — Z5181 Encounter for therapeutic drug level monitoring: Secondary | ICD-10-CM | POA: Diagnosis not present

## 2020-11-19 DIAGNOSIS — Z79899 Other long term (current) drug therapy: Secondary | ICD-10-CM | POA: Insufficient documentation

## 2020-11-19 DIAGNOSIS — E538 Deficiency of other specified B group vitamins: Secondary | ICD-10-CM

## 2020-11-19 DIAGNOSIS — C8219 Follicular lymphoma grade II, extranodal and solid organ sites: Secondary | ICD-10-CM

## 2020-11-19 DIAGNOSIS — Z5111 Encounter for antineoplastic chemotherapy: Secondary | ICD-10-CM

## 2020-11-19 DIAGNOSIS — D696 Thrombocytopenia, unspecified: Secondary | ICD-10-CM | POA: Diagnosis not present

## 2020-11-19 DIAGNOSIS — Z5112 Encounter for antineoplastic immunotherapy: Secondary | ICD-10-CM | POA: Insufficient documentation

## 2020-11-19 DIAGNOSIS — C8280 Other types of follicular lymphoma, unspecified site: Secondary | ICD-10-CM | POA: Insufficient documentation

## 2020-11-19 LAB — COMPREHENSIVE METABOLIC PANEL
ALT: 22 U/L (ref 0–44)
AST: 30 U/L (ref 15–41)
Albumin: 4.5 g/dL (ref 3.5–5.0)
Alkaline Phosphatase: 79 U/L (ref 38–126)
Anion gap: 6 (ref 5–15)
BUN: 12 mg/dL (ref 8–23)
CO2: 28 mmol/L (ref 22–32)
Calcium: 9.6 mg/dL (ref 8.9–10.3)
Chloride: 101 mmol/L (ref 98–111)
Creatinine, Ser: 0.6 mg/dL (ref 0.44–1.00)
GFR, Estimated: >60 mL/min (ref >60)
Glucose, Bld: 126 mg/dL — ABNORMAL HIGH (ref 70–99)
Potassium: 3.7 mmol/L (ref 3.5–5.1)
Sodium: 135 mmol/L (ref 135–145)
Total Bilirubin: 0.4 mg/dL (ref 0.3–1.2)
Total Protein: 7.3 g/dL (ref 6.5–8.1)

## 2020-11-19 LAB — CBC WITH DIFFERENTIAL/PLATELET
Abs Immature Granulocytes: 0.04 10*3/uL (ref 0.00–0.07)
Basophils Absolute: 0 10*3/uL (ref 0.0–0.1)
Basophils Relative: 0 %
Eosinophils Absolute: 0 10*3/uL (ref 0.0–0.5)
Eosinophils Relative: 0 %
HCT: 30.6 % — ABNORMAL LOW (ref 36.0–46.0)
Hemoglobin: 10.4 g/dL — ABNORMAL LOW (ref 12.0–15.0)
Immature Granulocytes: 1 %
Lymphocytes Relative: 21 %
Lymphs Abs: 1 10*3/uL (ref 0.7–4.0)
MCH: 31.2 pg (ref 26.0–34.0)
MCHC: 34 g/dL (ref 30.0–36.0)
MCV: 91.9 fL (ref 80.0–100.0)
Monocytes Absolute: 0.8 10*3/uL (ref 0.1–1.0)
Monocytes Relative: 17 %
Neutro Abs: 2.9 10*3/uL (ref 1.7–7.7)
Neutrophils Relative %: 61 %
Platelets: 106 10*3/uL — ABNORMAL LOW (ref 150–400)
RBC: 3.33 MIL/uL — ABNORMAL LOW (ref 3.87–5.11)
RDW: 18.8 % — ABNORMAL HIGH (ref 11.5–15.5)
WBC: 4.6 10*3/uL (ref 4.0–10.5)
nRBC: 0 % (ref 0.0–0.2)

## 2020-11-19 LAB — URIC ACID: Uric Acid, Serum: 3.2 mg/dL (ref 2.5–7.1)

## 2020-11-19 LAB — LACTATE DEHYDROGENASE: LDH: 155 U/L (ref 98–192)

## 2020-11-19 MED ORDER — DIPHENHYDRAMINE HCL 25 MG PO CAPS
50.0000 mg | ORAL_CAPSULE | Freq: Once | ORAL | Status: AC
  Administered 2020-11-19: 50 mg via ORAL
  Filled 2020-11-19: qty 2

## 2020-11-19 MED ORDER — ACETAMINOPHEN 325 MG PO TABS
650.0000 mg | ORAL_TABLET | Freq: Once | ORAL | Status: AC
  Administered 2020-11-19: 650 mg via ORAL
  Filled 2020-11-19: qty 2

## 2020-11-19 MED ORDER — DOXORUBICIN HCL CHEMO IV INJECTION 2 MG/ML
50.0000 mg/m2 | Freq: Once | INTRAVENOUS | Status: AC
  Administered 2020-11-19: 92 mg via INTRAVENOUS
  Filled 2020-11-19: qty 46

## 2020-11-19 MED ORDER — FOLIC ACID 1 MG PO TABS: 1.0000 mg | ORAL_TABLET | Freq: Every day | ORAL | 1 refills | Status: AC

## 2020-11-19 MED ORDER — PALONOSETRON HCL INJECTION 0.25 MG/5ML
0.2500 mg | Freq: Once | INTRAVENOUS | Status: AC
  Administered 2020-11-19: 0.25 mg via INTRAVENOUS
  Filled 2020-11-19: qty 5

## 2020-11-19 MED ORDER — DEXAMETHASONE SODIUM PHOSPHATE 100 MG/10ML IJ SOLN
10.0000 mg | Freq: Once | INTRAMUSCULAR | Status: AC
  Administered 2020-11-19: 10 mg via INTRAVENOUS
  Filled 2020-11-19: qty 1

## 2020-11-19 MED ORDER — SODIUM CHLORIDE 0.9 % IV SOLN
Freq: Once | INTRAVENOUS | Status: AC
Start: 2020-11-19 — End: 2020-11-19
  Filled 2020-11-19: qty 250

## 2020-11-19 MED ORDER — SODIUM CHLORIDE 0.9 % IV SOLN
150.0000 mg | Freq: Once | INTRAVENOUS | Status: AC
Start: 2020-11-19 — End: 2020-11-19
  Administered 2020-11-19: 150 mg via INTRAVENOUS
  Filled 2020-11-19: qty 5

## 2020-11-19 MED ORDER — SODIUM CHLORIDE 0.9 % IV SOLN
750.0000 mg/m2 | Freq: Once | INTRAVENOUS | Status: AC
  Administered 2020-11-19: 1380 mg via INTRAVENOUS
  Filled 2020-11-19: qty 69

## 2020-11-19 MED ORDER — PREDNISONE 50 MG PO TABS: ORAL_TABLET | ORAL | 0 refills | Status: DC

## 2020-11-19 MED ORDER — SODIUM CHLORIDE 0.9 % IV SOLN
375.0000 mg/m2 | Freq: Once | INTRAVENOUS | Status: AC
  Administered 2020-11-19: 700 mg via INTRAVENOUS
  Filled 2020-11-19: qty 70

## 2020-11-19 MED ORDER — VINCRISTINE SULFATE CHEMO INJECTION 1 MG/ML
2.0000 mg | Freq: Once | INTRAVENOUS | Status: AC
  Administered 2020-11-19: 2 mg via INTRAVENOUS
  Filled 2020-11-19: qty 2

## 2020-11-19 MED ORDER — HEPARIN SOD (PORK) LOCK FLUSH 100 UNIT/ML IV SOLN
500.0000 [IU] | Freq: Once | INTRAVENOUS | Status: AC | PRN
  Administered 2020-11-19: 500 [IU]
  Filled 2020-11-19: qty 5

## 2020-11-19 NOTE — Patient Instructions (Signed)
[facility]  Discharge Instructions: Thank you for choosing [facility] to provide your oncology and hematology care.  If you have a lab appointment with the Cancer Center, please go directly to the Cancer Center and check in at the registration area.  Wear comfortable clothing and clothing appropriate for easy access to any Portacath or PICC line.   We strive to give you quality time with your provider. You may need to reschedule your appointment if you arrive late (15 or more minutes).  Arriving late affects you and other patients whose appointments are after yours.  Also, if you miss three or more appointments without notifying the office, you may be dismissed from the clinic at the provider's discretion.      For prescription refill requests, have your pharmacy contact our office and allow 72 hours for refills to be completed.    Today you received the following chemotherapy and/or immunotherapy agents       To help prevent nausea and vomiting after your treatment, we encourage you to take your nausea medication as directed.  BELOW ARE SYMPTOMS THAT SHOULD BE REPORTED IMMEDIATELY: *FEVER GREATER THAN 100.4 F (38 C) OR HIGHER *CHILLS OR SWEATING *NAUSEA AND VOMITING THAT IS NOT CONTROLLED WITH YOUR NAUSEA MEDICATION *UNUSUAL SHORTNESS OF BREATH *UNUSUAL BRUISING OR BLEEDING *URINARY PROBLEMS (pain or burning when urinating, or frequent urination) *BOWEL PROBLEMS (unusual diarrhea, constipation, pain near the anus) TENDERNESS IN MOUTH AND THROAT WITH OR WITHOUT PRESENCE OF ULCERS (sore throat, sores in mouth, or a toothache) UNUSUAL RASH, SWELLING OR PAIN  UNUSUAL VAGINAL DISCHARGE OR ITCHING   Items with * indicate a potential emergency and should be followed up as soon as possible or go to the Emergency Department if any problems should occur.  Please show the CHEMOTHERAPY ALERT CARD or IMMUNOTHERAPY ALERT CARD at check-in to the Emergency  Department and triage nurse.  Should you have questions after your visit or need to cancel or reschedule your appointment, please contact [facility]  [phone] and follow the prompts.  Office hours are 8:00 a.m. to 4:30 p.m. Monday - Friday. Please note that voicemails left after 4:00 p.m. may not be returned until the following business day.  We are closed weekends and major holidays. You have access to a nurse at all times for urgent questions. Please call the main number to the clinic [phone] and follow the prompts.  For any non-urgent questions, you may also contact your provider using MyChart. We now offer e-Visits for anyone 18 and older to request care online for non-urgent symptoms. For details visit [email or web address].   Also download the MyChart app! Go to the app store, search "MyChart", open the app, select [place], and log in with your MyChart username and password.  Due to Covid, a mask is required upon entering the hospital/clinic. If you do not have a mask, one will be given to you upon arrival. For doctor visits, patients may have 1 support person aged 18 or older with them. For treatment visits, patients cannot have anyone with them due to current Covid guidelines and our immunocompromised population.  

## 2020-11-19 NOTE — Progress Notes (Signed)
Hematology/Oncology Consult note [facility]  [phone] [phone]  Patient Care Team: [name], MD as PCP - General (Physician Assistant) End, Harrell Gave, MD as PCP - Cardiology (Cardiology) Bary Castilla Forest Gleason, MD as Consulting Physician (General Surgery) Lequita Asal, MD as Referring Physician (Hematology and Oncology)   Name of the patient: Natasha Farrell  [id number]  February 25, 1946   Date of visit: 11/19/20  Diagnosis-stage IV follicular lymphoma  Chief complaint/ Reason for visit-on treatment assessment prior to cycle 5 of R-CHOP chemotherapy  Heme/Onc history: Patient is a 75 year old female now transfer of care from Dr. Mike Gip.  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  She had presented with extensive adenopathy and massive splenomegaly.  Right axillary ultrasound guided biopsy on 04/21/2020 revealed fragments of lymph node with reactive follicular hyperplasia. There was no definite evidence of malignancy. Flow cytometry revealed a CD10+ B cell population. Clonality could not be evaluated due to non-specific light chain binding. There was no evidence of a T cell lymphoproliferative disorder.    PET scan on 08/12/2020 revealed widespread adenopathy, visceral involvement and signs of near diffuse, multifocal bony involvement, distribution most c/w lymphoma, Deauville category 5. There was ileal involvement as well. Peritoneal involvement was suggested as well with nodular changes about the peritoneum but without significant FDG uptake. There was paraspinal soft tissue without frank bony destruction. Scattered areas of sclerosis underestimated the degree of bony involvement. Abdominal ascites may be related to anasarca and or lymphatic congestion with marked enlargement of LEFT-sided pleural effusion and mild enlargement of small RIGHT-sided pleural fluid.  Left-sided pleural effusion has been  attributed to follicular lymphoma and she has undergone multiple thoracentesis in the past with the last one that was done in April 2022.   Bone marrow on 08/04/2020 revealed variably cellular bone marrow with trilineage hematopoiesis and involvement by an atypical lymphoid proliferation  The bone marrow core biopsy demonstrated involvement by an atypical lymphoid proliferation with paratrabecular lymphoid aggregates and  patchy infiltration of the bone marrow space with accompanying fibrosis. The infiltrate was composed of mixed B-cells and T-cells. At least a subset of the B-cells co-expressed CD10 and BCL6 without definitive BCL2 expression. Scattered larger CD20-positive larger B-cells were also present. Flow cytometry showed no definitive atypical B-cell population. No immunophenotypically aberrant T-cell population was identified. There were no increase in blasts. Monocytes showed heterogeneous CD56 expression.  Cytogenetics were normal (4, XX).    Interval history-patient tolerated cycle 4 of chemotherapy well without any significant side effects.  She reports some ongoing fatigue but denies other complaints at this time.  ECOG PS- 1 Pain scale- 0   Review of systems- Review of Systems  Constitutional:  Positive for malaise/fatigue. Negative for chills, fever and weight loss.  HENT:  Negative for congestion, ear discharge and nosebleeds.   Eyes:  Negative for blurred vision.  Respiratory:  Negative for cough, hemoptysis, sputum production, shortness of breath and wheezing.   Cardiovascular:  Negative for chest pain, palpitations, orthopnea and claudication.  Gastrointestinal:  Negative for abdominal pain, blood in stool, constipation, diarrhea, heartburn, melena, nausea and vomiting.  Genitourinary:  Negative for dysuria, flank pain, frequency, hematuria and urgency.  Musculoskeletal:  Negative for back pain, joint pain and myalgias.  Skin:  Negative for rash.  Neurological:  Negative  for dizziness, tingling, focal weakness, seizures, weakness and headaches.  Endo/Heme/Allergies:  Does not bruise/bleed easily.  Psychiatric/Behavioral:  Negative for depression and suicidal ideas.  The patient does not have insomnia.      Allergies  Allergen Reactions   Lisinopril Swelling    angioedema     Past Medical History:  Diagnosis Date   Hx of hysterectomy    Hyperlipidemia    Hypertension    Thyroid disease    rt thryroidectomy  09/25/2010     Past Surgical History:  Procedure Laterality Date   BREAST BIOPSY Right 04/21/2020   hydromark 3 [place] bx path pending   LYMPH NODE BIOPSY Left 08/01/2020   Procedure: LYMPH NODE BIOPSY;  Surgeon: Robert Bellow, MD;  Location: [facility] ORS;  Service: General;  Laterality: Left;   PORTACATH PLACEMENT Left 08/22/2020   Procedure: INSERTION PORT-A-CATH;  Surgeon: Robert Bellow, MD;  Location: [facility] ORS;  Service: General;  Laterality: Left;    Social History   Socioeconomic History   Marital status: Married    Spouse name: Not on file   Number of children: Not on file   Years of education: Not on file   Highest education level: Not on file  Occupational History   Not on file  Tobacco Use   Smoking status: Never   Smokeless tobacco: Never  Vaping Use   Vaping Use: Never used  Substance and Sexual Activity   Alcohol use: No   Drug use: No   Sexual activity: Not on file  Other Topics Concern   Not on file  Social History Narrative   Not on file   Social Determinants of Health   Financial Resource Strain: Not on file  Food Insecurity: Not on file  Transportation Needs: Not on file  Physical Activity: Not on file  Stress: Not on file  Social Connections: Not on file  Intimate Partner Violence: Not on file    Family History  Problem Relation Age of Onset   Prostate cancer Father    Breast cancer Sister 24   Colon cancer Sister    Kidney cancer Sister      Current Outpatient Medications:    acetaminophen  (TYLENOL) 500 MG tablet, Take 500 mg by mouth every 6 (six) hours as needed for moderate pain, fever or headache., Disp: , Rfl:    allopurinol (ZYLOPRIM) 300 MG tablet, Take 1 tablet (300 mg total) by mouth daily., Disp: 30 tablet, Rfl: 2   carvedilol (COREG) 25 MG tablet, Take 1 tablet (25 mg total) by mouth 2 (two) times daily., Disp: 180 tablet, Rfl: 3   cholecalciferol (VITAMIN D3) 25 MCG (1000 UNIT) tablet, Take 2,000 Units by mouth daily., Disp: , Rfl:    cyanocobalamin 1000 MCG tablet, Take 1,000 mcg by mouth daily., Disp: , Rfl:    feeding supplement (ENSURE ENLIVE / ENSURE PLUS) LIQD, Take 237 mLs by mouth 2 (two) times daily between meals., Disp: 237 mL, Rfl: 12   folic acid (FOLVITE) 1 MG tablet, Take 1 tablet (1 mg total) by mouth daily., Disp: 30 tablet, Rfl: 1   levothyroxine (SYNTHROID) 50 MCG tablet, Take 50 mcg by mouth daily before breakfast., Disp: , Rfl:    lidocaine-prilocaine (EMLA) cream, Apply 1 application topically as needed. Apply small amount to port site at least 1 hour prior to it being accessed, cover with plastic wrap, Disp: 30 g, Rfl: 1   loratadine (CLARITIN) 10 MG tablet, Take 10 mg by mouth daily as needed for allergies., Disp: , Rfl:    Multiple Vitamin (MULTIVITAMIN WITH MINERALS) TABS tablet, Take 1 tablet by mouth daily., Disp: , Rfl:  omeprazole (PRILOSEC) 20 MG capsule, Take 1 capsule (20 mg total) by mouth daily., Disp: 30 capsule, Rfl: 3   ondansetron (ZOFRAN) 8 MG tablet, Take 1 tablet (8 mg total) by mouth every 8 (eight) hours as needed for nausea or vomiting., Disp: 20 tablet, Rfl: 3   polyethylene glycol powder (GLYCOLAX/MIRALAX) 17 GM/SCOOP powder, Take by mouth daily as needed., Disp: , Rfl:    pravastatin (PRAVACHOL) 40 MG tablet, Take 40 mg by mouth daily., Disp: , Rfl:    prochlorperazine (COMPAZINE) 10 MG tablet, Take 1 tablet (10 mg total) by mouth every 6 (six) hours as needed (Nausea or vomiting)., Disp: 30 tablet, Rfl: 6   sodium chloride 1  g tablet, Take 1 tablet (1 g total) by mouth 2 (two) times daily with a meal., Disp: 120 tablet, Rfl: 0   spironolactone (ALDACTONE) 50 MG tablet, Take 0.5 tablet (25 mg) by mouth once daily , Disp: , Rfl:    predniSONE (DELTASONE) 50 MG tablet, Sig: Take 2 tablets (100 mg total) by mouth daily for 5 days. Take with food on days 1-5 of chemotherapy, Disp: 10 tablet, Rfl: 0 No current facility-administered medications for this visit.  Facility-Administered Medications Ordered in Other Visits:    cyclophosphamide (CYTOXAN) 1,380 mg in sodium chloride 0.9 % 250 mL chemo infusion, 750 mg/m2 (Treatment Plan Recorded), Intravenous, Once, Sindy Guadeloupe, MD   heparin lock flush 100 unit/mL, 500 Units, Intracatheter, Once PRN, Sindy Guadeloupe, MD   riTUXimab-pvvr (RUXIENCE) 700 mg in sodium chloride 0.9 % 250 mL (2.1875 mg/mL) infusion, 375 mg/m2 (Treatment Plan Recorded), Intravenous, Once, Sindy Guadeloupe, MD  Physical exam:  Vitals:   11/19/20 0858  BP: (!) 131/58  Pulse: 82  Resp: 20  Temp: (!) 96.6 F (35.9 C)  TempSrc: Tympanic  SpO2: 100%  Weight: 169 lb 6.8 oz (76.9 kg)   Physical Exam Constitutional:      General: She is not in acute distress. Cardiovascular:     Rate and Rhythm: Normal rate and regular rhythm.     Heart sounds: Normal heart sounds.  Pulmonary:     Effort: Pulmonary effort is normal.     Breath sounds: Normal breath sounds.  Abdominal:     General: Bowel sounds are normal.     Palpations: Abdomen is soft.     Comments: Spleen tip is palpable  Lymphadenopathy:     Comments: No palpable cervical, supraclavicular, axillary or inguinal adenopathy    Skin:    General: Skin is warm and dry.  Neurological:     Mental Status: She is alert and oriented to person, place, and time.     CMP Latest Ref Rng & Units 11/19/2020  Glucose 70 - 99 mg/dL 126(H)  BUN 8 - 23 mg/dL 12  Creatinine 0.44 - 1.00 mg/dL 0.60  Sodium 135 - 145 mmol/L 135  Potassium 3.5 - 5.1 mmol/L  3.7  Chloride 98 - 111 mmol/L 101  CO2 22 - 32 mmol/L 28  Calcium 8.9 - 10.3 mg/dL 9.6  Total Protein 6.5 - 8.1 g/dL 7.3  Total Bilirubin 0.3 - 1.2 mg/dL 0.4  Alkaline Phos 38 - 126 U/L 79  AST 15 - 41 U/L 30  ALT 0 - 44 U/L 22   CBC Latest Ref Rng & Units 11/19/2020  WBC 4.0 - 10.5 K/uL 4.6  Hemoglobin 12.0 - 15.0 g/dL 10.4(L)  Hematocrit 36.0 - 46.0 % 30.6(L)  Platelets 150 - 400 K/uL 106(L)  Assessment and plan- Patient is a 75 y.o. female with stage IV follicular lymphoma here for on treatment assessment prior to cycle 5 of R-CHOP chemotherapy  Counts okay to proceed with cycle 5 of R-CHOP chemotherapy today.  Udenyca tomorrow.  Return to clinic in 3 weeks with labs for cycle 6.  Ideally she would have needed an interim scan after 3 cycles but since we are already had cycle 5 today I will plan to repeat scans after 6 cycles.  We will also repeat an echocardiogram at that time. Discussed pros and cons of maintenance Rituxan after 6 cycles of R-CHOP.  Patient has some ongoing thrombocytopenia which has been attributed to  Her splenomegaly as well as possible lymphoma involvement. Continue to monitor.  She has received R-CHOP at this platelet count before and has not required platelet transfusion so far.   Will also discuss Evushield with her at next visit.   Interval labs in 10 days for possible blood or platelet transfusion.  We will also check ferritin and iron studies B12 and folate at that time   Visit Diagnosis 1. Follicular lymphoma grade II of extranodal and solid organ sites ([place])   2. Encounter for antineoplastic chemotherapy   3. Encounter for monitoring rituximab therapy      Dr. Randa Evens, MD, MPH [facility] at [facility] [phone] 11/19/2020 11:54 AM

## 2020-11-20 ENCOUNTER — Ambulatory Visit: Payer: Medicare HMO

## 2020-11-20 ENCOUNTER — Inpatient Hospital Stay: Payer: Medicare HMO

## 2020-11-20 VITALS — BP 158/67 | HR 73 | Temp 97.1°F | Resp 18

## 2020-11-20 DIAGNOSIS — C8219 Follicular lymphoma grade II, extranodal and solid organ sites: Secondary | ICD-10-CM

## 2020-11-20 DIAGNOSIS — Z5112 Encounter for antineoplastic immunotherapy: Secondary | ICD-10-CM | POA: Diagnosis not present

## 2020-11-20 MED ORDER — PEGFILGRASTIM-CBQV 6 MG/0.6ML ~~LOC~~ SOSY
6.0000 mg | PREFILLED_SYRINGE | Freq: Once | SUBCUTANEOUS | Status: AC
  Administered 2020-11-20: 6 mg via SUBCUTANEOUS

## 2020-11-25 ENCOUNTER — Ambulatory Visit: Payer: Medicare HMO

## 2020-11-26 ENCOUNTER — Ambulatory Visit: Payer: Medicare HMO

## 2020-11-27 ENCOUNTER — Inpatient Hospital Stay: Payer: Medicare HMO | Attending: Oncology

## 2020-11-27 ENCOUNTER — Other Ambulatory Visit: Payer: Medicare HMO

## 2020-11-27 ENCOUNTER — Other Ambulatory Visit: Payer: Self-pay

## 2020-11-27 DIAGNOSIS — Z79899 Other long term (current) drug therapy: Secondary | ICD-10-CM | POA: Insufficient documentation

## 2020-11-27 DIAGNOSIS — Z5112 Encounter for antineoplastic immunotherapy: Secondary | ICD-10-CM | POA: Diagnosis present

## 2020-11-27 DIAGNOSIS — Z5111 Encounter for antineoplastic chemotherapy: Secondary | ICD-10-CM | POA: Insufficient documentation

## 2020-11-27 DIAGNOSIS — C8218 Follicular lymphoma grade II, lymph nodes of multiple sites: Secondary | ICD-10-CM | POA: Insufficient documentation

## 2020-11-27 DIAGNOSIS — Z5189 Encounter for other specified aftercare: Secondary | ICD-10-CM | POA: Insufficient documentation

## 2020-11-27 DIAGNOSIS — C8219 Follicular lymphoma grade II, extranodal and solid organ sites: Secondary | ICD-10-CM

## 2020-11-27 LAB — CBC WITH DIFFERENTIAL/PLATELET
Abs Immature Granulocytes: 0.18 10*3/uL — ABNORMAL HIGH (ref 0.00–0.07)
Basophils Absolute: 0.1 10*3/uL (ref 0.0–0.1)
Basophils Relative: 2 %
Eosinophils Absolute: 0 10*3/uL (ref 0.0–0.5)
Eosinophils Relative: 0 %
HCT: 30.2 % — ABNORMAL LOW (ref 36.0–46.0)
Hemoglobin: 10.1 g/dL — ABNORMAL LOW (ref 12.0–15.0)
Immature Granulocytes: 5 %
Lymphocytes Relative: 22 %
Lymphs Abs: 0.7 10*3/uL (ref 0.7–4.0)
MCH: 30.8 pg (ref 26.0–34.0)
MCHC: 33.4 g/dL (ref 30.0–36.0)
MCV: 92.1 fL (ref 80.0–100.0)
Monocytes Absolute: 0.4 10*3/uL (ref 0.1–1.0)
Monocytes Relative: 13 %
Neutro Abs: 2 10*3/uL (ref 1.7–7.7)
Neutrophils Relative %: 58 %
Platelets: 45 10*3/uL — ABNORMAL LOW (ref 150–400)
RBC: 3.28 MIL/uL — ABNORMAL LOW (ref 3.87–5.11)
RDW: 18 % — ABNORMAL HIGH (ref 11.5–15.5)
WBC: 3.3 10*3/uL — ABNORMAL LOW (ref 4.0–10.5)
nRBC: 0 % (ref 0.0–0.2)

## 2020-11-27 LAB — SAMPLE TO BLOOD BANK

## 2020-11-27 LAB — IRON AND TIBC
Iron: 95 ug/dL (ref 28–170)
Saturation Ratios: 29 % (ref 10.4–31.8)
TIBC: 329 ug/dL (ref 250–450)
UIBC: 234 ug/dL

## 2020-11-27 LAB — FERRITIN: Ferritin: 481 ng/mL — ABNORMAL HIGH (ref 11–307)

## 2020-11-27 LAB — VITAMIN B12: Vitamin B-12: 5297 pg/mL — ABNORMAL HIGH (ref 180–914)

## 2020-11-28 ENCOUNTER — Ambulatory Visit: Payer: Medicare HMO

## 2020-12-01 ENCOUNTER — Encounter: Payer: Self-pay | Admitting: Hematology and Oncology

## 2020-12-02 ENCOUNTER — Ambulatory Visit: Payer: Medicare HMO

## 2020-12-02 NOTE — Progress Notes (Signed)
Nutrition Follow-up:    Patient with stage IV follicular lymphoma.  Patient receiving chemotherapy.    Spoke with patient via phone for nutrition follow-up.  Patient reports that her appetite is "too good."  Reports that she is eating well.  Denies any nutrition impact symptoms.  Can't tolerate greasy foods.      Medications: reviewed  Labs: reviewed  Anthropometrics:   Weight 169 lb 6 oz on 6/29  166 lb on 6/7 175 lb on 08/14/20   NUTRITION DIAGNOSIS: Unintentional weight loss improved   INTERVENTION:  Encouraged patient to continue eating well balanced diet with good sources of protein.  Patient has RD contact information and will contact RD if changes in nutrition occur    NEXT VISIT:  No follow-up at this time.  RD available as needed  Natasha Farrell, [place], [place] Registered Dietitian [phone] (mobile)

## 2020-12-10 ENCOUNTER — Inpatient Hospital Stay: Payer: Medicare HMO

## 2020-12-10 ENCOUNTER — Other Ambulatory Visit: Payer: Self-pay

## 2020-12-10 ENCOUNTER — Encounter: Payer: Self-pay | Admitting: Oncology

## 2020-12-10 ENCOUNTER — Inpatient Hospital Stay (HOSPITAL_BASED_OUTPATIENT_CLINIC_OR_DEPARTMENT_OTHER): Payer: Medicare HMO | Admitting: Oncology

## 2020-12-10 ENCOUNTER — Ambulatory Visit: Payer: Medicare HMO

## 2020-12-10 VITALS — BP 124/55 | HR 86 | Temp 97.0°F | Resp 18 | Wt 169.8 lb

## 2020-12-10 VITALS — BP 129/80 | HR 85

## 2020-12-10 DIAGNOSIS — Z5111 Encounter for antineoplastic chemotherapy: Secondary | ICD-10-CM

## 2020-12-10 DIAGNOSIS — D701 Agranulocytosis secondary to cancer chemotherapy: Secondary | ICD-10-CM | POA: Diagnosis not present

## 2020-12-10 DIAGNOSIS — E790 Hyperuricemia without signs of inflammatory arthritis and tophaceous disease: Secondary | ICD-10-CM | POA: Diagnosis not present

## 2020-12-10 DIAGNOSIS — Z5112 Encounter for antineoplastic immunotherapy: Secondary | ICD-10-CM | POA: Diagnosis not present

## 2020-12-10 DIAGNOSIS — Z79899 Other long term (current) drug therapy: Secondary | ICD-10-CM

## 2020-12-10 DIAGNOSIS — C8219 Follicular lymphoma grade II, extranodal and solid organ sites: Secondary | ICD-10-CM | POA: Diagnosis not present

## 2020-12-10 DIAGNOSIS — Z5181 Encounter for therapeutic drug level monitoring: Secondary | ICD-10-CM | POA: Diagnosis not present

## 2020-12-10 DIAGNOSIS — T451X5A Adverse effect of antineoplastic and immunosuppressive drugs, initial encounter: Secondary | ICD-10-CM

## 2020-12-10 LAB — CBC WITH DIFFERENTIAL/PLATELET
Abs Immature Granulocytes: 0.02 10*3/uL (ref 0.00–0.07)
Basophils Absolute: 0 10*3/uL (ref 0.0–0.1)
Basophils Relative: 0 %
Eosinophils Absolute: 0 10*3/uL (ref 0.0–0.5)
Eosinophils Relative: 0 %
HCT: 31.7 % — ABNORMAL LOW (ref 36.0–46.0)
Hemoglobin: 10.9 g/dL — ABNORMAL LOW (ref 12.0–15.0)
Immature Granulocytes: 1 %
Lymphocytes Relative: 26 %
Lymphs Abs: 0.9 10*3/uL (ref 0.7–4.0)
MCH: 31.4 pg (ref 26.0–34.0)
MCHC: 34.4 g/dL (ref 30.0–36.0)
MCV: 91.4 fL (ref 80.0–100.0)
Monocytes Absolute: 0.9 10*3/uL (ref 0.1–1.0)
Monocytes Relative: 25 %
Neutro Abs: 1.8 10*3/uL (ref 1.7–7.7)
Neutrophils Relative %: 48 %
Platelets: 98 10*3/uL — ABNORMAL LOW (ref 150–400)
RBC: 3.47 MIL/uL — ABNORMAL LOW (ref 3.87–5.11)
RDW: 18.7 % — ABNORMAL HIGH (ref 11.5–15.5)
WBC: 3.7 10*3/uL — ABNORMAL LOW (ref 4.0–10.5)
nRBC: 0 % (ref 0.0–0.2)

## 2020-12-10 LAB — COMPREHENSIVE METABOLIC PANEL
ALT: 27 U/L (ref 0–44)
AST: 35 U/L (ref 15–41)
Albumin: 4.5 g/dL (ref 3.5–5.0)
Alkaline Phosphatase: 85 U/L (ref 38–126)
Anion gap: 6 (ref 5–15)
BUN: 9 mg/dL (ref 8–23)
CO2: 29 mmol/L (ref 22–32)
Calcium: 9.6 mg/dL (ref 8.9–10.3)
Chloride: 101 mmol/L (ref 98–111)
Creatinine, Ser: 0.9 mg/dL (ref 0.44–1.00)
GFR, Estimated: >60 mL/min (ref >60)
Glucose, Bld: 108 mg/dL — ABNORMAL HIGH (ref 70–99)
Potassium: 4 mmol/L (ref 3.5–5.1)
Sodium: 136 mmol/L (ref 135–145)
Total Bilirubin: 0.5 mg/dL (ref 0.3–1.2)
Total Protein: 7.2 g/dL (ref 6.5–8.1)

## 2020-12-10 LAB — LACTATE DEHYDROGENASE: LDH: 158 U/L (ref 98–192)

## 2020-12-10 MED ORDER — DOXORUBICIN HCL CHEMO IV INJECTION 2 MG/ML
50.0000 mg/m2 | Freq: Once | INTRAVENOUS | Status: AC
  Administered 2020-12-10: 92 mg via INTRAVENOUS
  Filled 2020-12-10: qty 46

## 2020-12-10 MED ORDER — HEPARIN SOD (PORK) LOCK FLUSH 100 UNIT/ML IV SOLN
500.0000 [IU] | Freq: Once | INTRAVENOUS | Status: AC | PRN
  Administered 2020-12-10: 500 [IU]
  Filled 2020-12-10: qty 5

## 2020-12-10 MED ORDER — ACETAMINOPHEN 325 MG PO TABS
650.0000 mg | ORAL_TABLET | Freq: Once | ORAL | Status: AC
  Administered 2020-12-10: 650 mg via ORAL

## 2020-12-10 MED ORDER — SODIUM CHLORIDE 0.9 % IV SOLN
150.0000 mg | Freq: Once | INTRAVENOUS | Status: AC
  Administered 2020-12-10: 150 mg via INTRAVENOUS
  Filled 2020-12-10: qty 150

## 2020-12-10 MED ORDER — ACETAMINOPHEN 325 MG PO TABS
ORAL_TABLET | ORAL | Status: AC
  Filled 2020-12-10: qty 2

## 2020-12-10 MED ORDER — PREDNISONE 50 MG PO TABS: ORAL_TABLET | ORAL | 0 refills | Status: DC

## 2020-12-10 MED ORDER — SODIUM CHLORIDE 0.9 % IV SOLN
Freq: Once | INTRAVENOUS | Status: AC
  Filled 2020-12-10: qty 250

## 2020-12-10 MED ORDER — SODIUM CHLORIDE 0.9 % IV SOLN
375.0000 mg/m2 | Freq: Once | INTRAVENOUS | Status: AC
  Administered 2020-12-10: 700 mg via INTRAVENOUS
  Filled 2020-12-10: qty 20

## 2020-12-10 MED ORDER — VINCRISTINE SULFATE CHEMO INJECTION 1 MG/ML
2.0000 mg | Freq: Once | INTRAVENOUS | Status: AC
  Administered 2020-12-10: 2 mg via INTRAVENOUS
  Filled 2020-12-10: qty 2

## 2020-12-10 MED ORDER — PALONOSETRON HCL INJECTION 0.25 MG/5ML
0.2500 mg | Freq: Once | INTRAVENOUS | Status: AC
  Administered 2020-12-10: 0.25 mg via INTRAVENOUS

## 2020-12-10 MED ORDER — DIPHENHYDRAMINE HCL 25 MG PO CAPS
50.0000 mg | ORAL_CAPSULE | Freq: Once | ORAL | Status: AC
  Administered 2020-12-10: 50 mg via ORAL

## 2020-12-10 MED ORDER — DIPHENHYDRAMINE HCL 25 MG PO CAPS
ORAL_CAPSULE | ORAL | Status: AC
  Filled 2020-12-10: qty 2

## 2020-12-10 MED ORDER — SODIUM CHLORIDE 0.9 % IV SOLN
10.0000 mg | Freq: Once | INTRAVENOUS | Status: AC
  Administered 2020-12-10: 10 mg via INTRAVENOUS
  Filled 2020-12-10: qty 10

## 2020-12-10 MED ORDER — SODIUM CHLORIDE 0.9 % IV SOLN
750.0000 mg/m2 | Freq: Once | INTRAVENOUS | Status: AC
  Administered 2020-12-10: 1380 mg via INTRAVENOUS
  Filled 2020-12-10: qty 50

## 2020-12-10 MED ORDER — ALLOPURINOL 300 MG PO TABS: 300.0000 mg | ORAL_TABLET | Freq: Every day | ORAL | 2 refills | Status: DC

## 2020-12-10 MED ORDER — SODIUM CHLORIDE 0.9% FLUSH
10.0000 mL | INTRAVENOUS | Status: AC | PRN
  Administered 2020-12-10: 10 mL
  Filled 2020-12-10: qty 10

## 2020-12-10 NOTE — Progress Notes (Signed)
Hematology/Oncology Consult note [facility]  [phone] [phone]  Patient Care Team: [name], MD as PCP - General (Physician Assistant) End, Harrell Gave, MD as PCP - Cardiology (Cardiology) Bary Castilla Forest Gleason, MD as Consulting Physician (General Surgery) Lequita Asal, MD as Referring Physician (Hematology and Oncology)   Name of the patient: Natasha Farrell  [id number]  1946-04-10   Date of visit: 12/10/20  Diagnosis- stage IV follicular lymphoma  Chief complaint/ Reason for visit-on treatment assessment prior to cycle 6 of R-CHOP chemotherapy  Heme/Onc history: Patient is a 75 year old female now transfer of care from Dr. Mike Gip.  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  She had presented with extensive adenopathy and massive splenomegaly.   Right axillary ultrasound guided biopsy on 04/21/2020 revealed fragments of lymph node with reactive follicular hyperplasia. There was no definite evidence of malignancy. Flow cytometry revealed a CD10+ B cell population. Clonality could not be evaluated due to non-specific light chain binding. There was no evidence of a T cell lymphoproliferative disorder.      PET scan on 08/12/2020 revealed widespread adenopathy, visceral involvement and signs of near diffuse, multifocal bony involvement, distribution most c/w lymphoma, Deauville category 5. There was ileal involvement as well. Peritoneal involvement was suggested as well with nodular changes about the peritoneum but without significant FDG uptake. There was paraspinal soft tissue without frank bony destruction. Scattered areas of sclerosis underestimated the degree of bony involvement. Abdominal ascites may be related to anasarca and or lymphatic congestion with marked enlargement of LEFT-sided pleural effusion and mild enlargement of small RIGHT-sided pleural fluid.  Left-sided pleural effusion has  been attributed to follicular lymphoma and she has undergone multiple thoracentesis in the past with the last one that was done in April 2022.     Bone marrow on 08/04/2020 revealed variably cellular bone marrow with trilineage hematopoiesis and involvement by an atypical lymphoid proliferation  The bone marrow core biopsy demonstrated involvement by an atypical lymphoid proliferation with paratrabecular lymphoid aggregates and  patchy infiltration of the bone marrow space with accompanying fibrosis. The infiltrate was composed of mixed B-cells and T-cells. At least a subset of the B-cells co-expressed CD10 and BCL6 without definitive BCL2 expression. Scattered larger CD20-positive larger B-cells were also present. Flow cytometry showed no definitive atypical B-cell population. No immunophenotypically aberrant T-cell population was identified. There were no increase in blasts. Monocytes showed heterogeneous CD56 expression.  Cytogenetics were normal (41, XX).   Patient was seen by me for the first time prior to cycle 5 of R-CHOP chemotherapy and did not get any interim scans after 3 cycles.  Plan is therefore to get a PET scan after 6 cycles of R-CHOP chemotherapy  Interval history-overall patient feels well and denies any specific complaints at this time other than mild fatigue.  ECOG PS- 1 Pain scale- 0   Review of systems- Review of Systems  Constitutional:  Negative for chills, fever, malaise/fatigue and weight loss.  HENT:  Negative for congestion, ear discharge and nosebleeds.   Eyes:  Negative for blurred vision.  Respiratory:  Negative for cough, hemoptysis, sputum production, shortness of breath and wheezing.   Cardiovascular:  Negative for chest pain, palpitations, orthopnea and claudication.  Gastrointestinal:  Negative for abdominal pain, blood in stool, constipation, diarrhea, heartburn, melena, nausea and vomiting.  Genitourinary:  Negative for dysuria, flank pain, frequency,  hematuria and urgency.  Musculoskeletal:  Negative for back pain, joint pain and  myalgias.  Skin:  Negative for rash.  Neurological:  Negative for dizziness, tingling, focal weakness, seizures, weakness and headaches.  Endo/Heme/Allergies:  Does not bruise/bleed easily.  Psychiatric/Behavioral:  Negative for depression and suicidal ideas. The patient does not have insomnia.      Allergies  Allergen Reactions   Lisinopril Swelling    angioedema     Past Medical History:  Diagnosis Date   Hx of hysterectomy    Hyperlipidemia    Hypertension    Thyroid disease    rt thryroidectomy  09/25/2010     Past Surgical History:  Procedure Laterality Date   BREAST BIOPSY Right 04/21/2020   hydromark 3 [place] bx path pending   LYMPH NODE BIOPSY Left 08/01/2020   Procedure: LYMPH NODE BIOPSY;  Surgeon: Robert Bellow, MD;  Location: [facility] ORS;  Service: General;  Laterality: Left;   PORTACATH PLACEMENT Left 08/22/2020   Procedure: INSERTION PORT-A-CATH;  Surgeon: Robert Bellow, MD;  Location: [facility] ORS;  Service: General;  Laterality: Left;    Social History   Socioeconomic History   Marital status: Married    Spouse name: Not on file   Number of children: Not on file   Years of education: Not on file   Highest education level: Not on file  Occupational History   Not on file  Tobacco Use   Smoking status: Never   Smokeless tobacco: Never  Vaping Use   Vaping Use: Never used  Substance and Sexual Activity   Alcohol use: No   Drug use: No   Sexual activity: Not on file  Other Topics Concern   Not on file  Social History Narrative   Not on file   Social Determinants of Health   Financial Resource Strain: Not on file  Food Insecurity: Not on file  Transportation Needs: Not on file  Physical Activity: Not on file  Stress: Not on file  Social Connections: Not on file  Intimate Partner Violence: Not on file    Family History  Problem Relation Age of Onset   Prostate  cancer Father    Breast cancer Sister 19   Colon cancer Sister    Kidney cancer Sister      Current Outpatient Medications:    acetaminophen (TYLENOL) 500 MG tablet, Take 500 mg by mouth every 6 (six) hours as needed for moderate pain, fever or headache., Disp: , Rfl:    allopurinol (ZYLOPRIM) 300 MG tablet, Take 1 tablet (300 mg total) by mouth daily., Disp: 30 tablet, Rfl: 2   carvedilol (COREG) 25 MG tablet, Take 1 tablet (25 mg total) by mouth 2 (two) times daily., Disp: 180 tablet, Rfl: 3   cholecalciferol (VITAMIN D3) 25 MCG (1000 UNIT) tablet, Take 2,000 Units by mouth daily., Disp: , Rfl:    cyanocobalamin 1000 MCG tablet, Take 1,000 mcg by mouth daily., Disp: , Rfl:    feeding supplement (ENSURE ENLIVE / ENSURE PLUS) LIQD, Take 237 mLs by mouth 2 (two) times daily between meals., Disp: 237 mL, Rfl: 12   folic acid (FOLVITE) 1 MG tablet, Take 1 tablet (1 mg total) by mouth daily., Disp: 30 tablet, Rfl: 1   levothyroxine (SYNTHROID) 50 MCG tablet, Take 50 mcg by mouth daily before breakfast., Disp: , Rfl:    lidocaine-prilocaine (EMLA) cream, Apply 1 application topically as needed. Apply small amount to port site at least 1 hour prior to it being accessed, cover with plastic wrap, Disp: 30 g, Rfl: 1   loratadine (CLARITIN)  10 MG tablet, Take 10 mg by mouth daily as needed for allergies., Disp: , Rfl:    Multiple Vitamin (MULTIVITAMIN WITH MINERALS) TABS tablet, Take 1 tablet by mouth daily., Disp: , Rfl:    omeprazole (PRILOSEC) 20 MG capsule, Take 1 capsule (20 mg total) by mouth daily., Disp: 30 capsule, Rfl: 3   ondansetron (ZOFRAN) 8 MG tablet, Take 1 tablet (8 mg total) by mouth every 8 (eight) hours as needed for nausea or vomiting., Disp: 20 tablet, Rfl: 3   polyethylene glycol powder (GLYCOLAX/MIRALAX) 17 GM/SCOOP powder, Take by mouth daily as needed., Disp: , Rfl:    pravastatin (PRAVACHOL) 40 MG tablet, Take 40 mg by mouth daily., Disp: , Rfl:    predniSONE (DELTASONE) 50 MG  tablet, Sig: Take 2 tablets (100 mg total) by mouth daily for 5 days. Take with food on days 1-5 of chemotherapy, Disp: 10 tablet, Rfl: 0   prochlorperazine (COMPAZINE) 10 MG tablet, Take 1 tablet (10 mg total) by mouth every 6 (six) hours as needed (Nausea or vomiting)., Disp: 30 tablet, Rfl: 6   sodium chloride 1 g tablet, Take 1 tablet (1 g total) by mouth 2 (two) times daily with a meal., Disp: 120 tablet, Rfl: 0   spironolactone (ALDACTONE) 50 MG tablet, Take 0.5 tablet (25 mg) by mouth once daily , Disp: , Rfl:   Physical exam:  Vitals:   12/10/20 0840 12/10/20 0842  BP:  (!) 124/55  Pulse:  86  Resp:  18  Temp:  (!) 97 F (36.1 C)  TempSrc:  Tympanic  SpO2:  100%  Weight: 169 lb 12.1 oz (77 kg)    Physical Exam Cardiovascular:     Rate and Rhythm: Normal rate and regular rhythm.     Heart sounds: Normal heart sounds.  Pulmonary:     Effort: Pulmonary effort is normal.     Breath sounds: Normal breath sounds.  Abdominal:     General: Bowel sounds are normal.     Palpations: Abdomen is soft.  Skin:    General: Skin is warm and dry.  Neurological:     Mental Status: She is alert and oriented to person, place, and time.     CMP Latest Ref Rng & Units 11/19/2020  Glucose 70 - 99 mg/dL 126(H)  BUN 8 - 23 mg/dL 12  Creatinine 0.44 - 1.00 mg/dL 0.60  Sodium 135 - 145 mmol/L 135  Potassium 3.5 - 5.1 mmol/L 3.7  Chloride 98 - 111 mmol/L 101  CO2 22 - 32 mmol/L 28  Calcium 8.9 - 10.3 mg/dL 9.6  Total Protein 6.5 - 8.1 g/dL 7.3  Total Bilirubin 0.3 - 1.2 mg/dL 0.4  Alkaline Phos 38 - 126 U/L 79  AST 15 - 41 U/L 30  ALT 0 - 44 U/L 22   CBC Latest Ref Rng & Units 12/10/2020  WBC 4.0 - 10.5 K/uL 3.7(L)  Hemoglobin 12.0 - 15.0 g/dL 10.9(L)  Hematocrit 36.0 - 46.0 % 31.7(L)  Platelets 150 - 400 K/uL 98(L)     Assessment and plan- Patient is a 75 y.o. female with stage IV follicular lymphoma here for on treatment assessment prior to cycle 6 of R-CHOP  chemotherapy  Clinically patient has responded well to treatment with no palpable cervical or axillary adenopathy.  She will proceed with cycle 6 of R-CHOP chemotherapy today with [facility] tomorrow.  ANC is mildly lower at 1.8.  Continue to monitor  She will be seen by covering NP in 10  days with CBC with differential and BMP for possible fluids.  Neutropenic precautions reviewed with the patient  Repeat PET CT scan and echocardiogram in 2 weeks and I will see her back in 3 weeks  Thrombocytopenia: Likely secondary to splenomegaly as well as chemotherapy.  Continue to monitor  Patient would also qualify for Evushield prophylaxis against COVID and we will look into how we can give it to her  I will renew her allopurinol and prednisone prescription today   Visit Diagnosis 1. Encounter for antineoplastic chemotherapy   2. Encounter for monitoring rituximab therapy   3. Follicular lymphoma grade II of extranodal and solid organ sites [facility])      Dr. Randa Evens, MD, MPH [facility] at [facility] [phone] 12/10/2020 8:41 AM

## 2020-12-10 NOTE — Progress Notes (Signed)
Plt 98 ok to proceed per MD

## 2020-12-11 ENCOUNTER — Inpatient Hospital Stay: Payer: Medicare HMO

## 2020-12-11 VITALS — BP 159/81 | HR 77 | Temp 96.7°F | Resp 18

## 2020-12-11 DIAGNOSIS — Z5112 Encounter for antineoplastic immunotherapy: Secondary | ICD-10-CM | POA: Diagnosis not present

## 2020-12-11 DIAGNOSIS — C8219 Follicular lymphoma grade II, extranodal and solid organ sites: Secondary | ICD-10-CM

## 2020-12-11 MED ORDER — PEGFILGRASTIM-CBQV 6 MG/0.6ML ~~LOC~~ SOSY
6.0000 mg | PREFILLED_SYRINGE | Freq: Once | SUBCUTANEOUS | Status: AC
  Administered 2020-12-11: 6 mg via SUBCUTANEOUS

## 2020-12-15 ENCOUNTER — Telehealth: Payer: Self-pay | Admitting: Oncology

## 2020-12-15 NOTE — Telephone Encounter (Signed)
Received authorization for pt's PET scan today. Scheduled and left VM with patient with day/time/instructions. Will send appt reminder via mail also.

## 2020-12-19 ENCOUNTER — Inpatient Hospital Stay: Payer: Medicare HMO

## 2020-12-19 ENCOUNTER — Other Ambulatory Visit: Payer: Self-pay

## 2020-12-19 ENCOUNTER — Encounter: Payer: Self-pay | Admitting: Nurse Practitioner

## 2020-12-19 ENCOUNTER — Inpatient Hospital Stay (HOSPITAL_BASED_OUTPATIENT_CLINIC_OR_DEPARTMENT_OTHER): Payer: Medicare HMO | Admitting: Nurse Practitioner

## 2020-12-19 VITALS — BP 121/75 | HR 89 | Temp 97.8°F | Resp 18 | Wt 170.5 lb

## 2020-12-19 DIAGNOSIS — D849 Immunodeficiency, unspecified: Secondary | ICD-10-CM

## 2020-12-19 DIAGNOSIS — C8219 Follicular lymphoma grade II, extranodal and solid organ sites: Secondary | ICD-10-CM | POA: Diagnosis not present

## 2020-12-19 DIAGNOSIS — E790 Hyperuricemia without signs of inflammatory arthritis and tophaceous disease: Secondary | ICD-10-CM

## 2020-12-19 DIAGNOSIS — Z79899 Other long term (current) drug therapy: Secondary | ICD-10-CM

## 2020-12-19 DIAGNOSIS — Z5111 Encounter for antineoplastic chemotherapy: Secondary | ICD-10-CM

## 2020-12-19 DIAGNOSIS — Z79620 Long term (current) use of immunosuppressive biologic: Secondary | ICD-10-CM

## 2020-12-19 DIAGNOSIS — T451X5A Adverse effect of antineoplastic and immunosuppressive drugs, initial encounter: Secondary | ICD-10-CM

## 2020-12-19 DIAGNOSIS — Z5112 Encounter for antineoplastic immunotherapy: Secondary | ICD-10-CM | POA: Diagnosis not present

## 2020-12-19 DIAGNOSIS — D701 Agranulocytosis secondary to cancer chemotherapy: Secondary | ICD-10-CM

## 2020-12-19 LAB — BASIC METABOLIC PANEL
Anion gap: 7 (ref 5–15)
BUN: 7 mg/dL — ABNORMAL LOW (ref 8–23)
CO2: 29 mmol/L (ref 22–32)
Calcium: 9.8 mg/dL (ref 8.9–10.3)
Chloride: 99 mmol/L (ref 98–111)
Creatinine, Ser: 0.74 mg/dL (ref 0.44–1.00)
GFR, Estimated: >60 mL/min (ref >60)
Glucose, Bld: 104 mg/dL — ABNORMAL HIGH (ref 70–99)
Potassium: 3.9 mmol/L (ref 3.5–5.1)
Sodium: 135 mmol/L (ref 135–145)

## 2020-12-19 LAB — CBC WITH DIFFERENTIAL/PLATELET
Abs Immature Granulocytes: 0.13 10*3/uL — ABNORMAL HIGH (ref 0.00–0.07)
Basophils Absolute: 0 10*3/uL (ref 0.0–0.1)
Basophils Relative: 1 %
Eosinophils Absolute: 0 10*3/uL (ref 0.0–0.5)
Eosinophils Relative: 0 %
HCT: 30.4 % — ABNORMAL LOW (ref 36.0–46.0)
Hemoglobin: 10.2 g/dL — ABNORMAL LOW (ref 12.0–15.0)
Immature Granulocytes: 4 %
Lymphocytes Relative: 24 %
Lymphs Abs: 0.8 10*3/uL (ref 0.7–4.0)
MCH: 30.4 pg (ref 26.0–34.0)
MCHC: 33.6 g/dL (ref 30.0–36.0)
MCV: 90.7 fL (ref 80.0–100.0)
Monocytes Absolute: 0.7 10*3/uL (ref 0.1–1.0)
Monocytes Relative: 20 %
Neutro Abs: 1.8 10*3/uL (ref 1.7–7.7)
Neutrophils Relative %: 51 %
Platelets: 43 10*3/uL — ABNORMAL LOW (ref 150–400)
RBC: 3.35 MIL/uL — ABNORMAL LOW (ref 3.87–5.11)
RDW: 18 % — ABNORMAL HIGH (ref 11.5–15.5)
WBC: 3.6 10*3/uL — ABNORMAL LOW (ref 4.0–10.5)
nRBC: 0 % (ref 0.0–0.2)

## 2020-12-19 MED ORDER — EPINEPHRINE 0.3 MG/0.3ML IJ SOAJ
0.3000 mg | Freq: Once | INTRAMUSCULAR | Status: DC | PRN
Start: 2020-12-19 — End: 2020-12-19

## 2020-12-19 MED ORDER — TIXAGEVIMAB (PART OF EVUSHELD) INJECTION: 300.0000 mg | Freq: Once | INTRAMUSCULAR | Status: DC

## 2020-12-19 MED ORDER — ALBUTEROL SULFATE HFA 108 (90 BASE) MCG/ACT IN AERS: 2.0000 | INHALATION_SPRAY | Freq: Once | RESPIRATORY_TRACT | Status: DC | PRN

## 2020-12-19 MED ORDER — CILGAVIMAB (PART OF EVUSHELD) INJECTION: 300.0000 mg | Freq: Once | INTRAMUSCULAR | Status: DC

## 2020-12-19 MED ORDER — METHYLPREDNISOLONE SODIUM SUCC 125 MG IJ SOLR: 125.0000 mg | Freq: Once | INTRAMUSCULAR | Status: DC | PRN

## 2020-12-19 MED ORDER — DIPHENHYDRAMINE HCL 50 MG/ML IJ SOLN: 50.0000 mg | Freq: Once | INTRAMUSCULAR | Status: DC | PRN

## 2020-12-19 MED ORDER — HEPARIN SOD (PORK) LOCK FLUSH 100 UNIT/ML IV SOLN
500.0000 [IU] | Freq: Once | INTRAVENOUS | Status: AC
Start: 2020-12-19 — End: 2020-12-19
  Administered 2020-12-19: 500 [IU] via INTRAVENOUS
  Filled 2020-12-19: qty 5

## 2020-12-19 NOTE — Progress Notes (Signed)
pt states that her last tx made her extremely tired and fatigue.

## 2020-12-19 NOTE — Addendum Note (Signed)
Addended by: Charlyn Minerva on: 12/19/2020 10:08 AM   Modules accepted: Orders

## 2020-12-19 NOTE — Progress Notes (Signed)
Hematology/Oncology Consult note [place] at [facility]  Patient Care Team: [name], MD as PCP - General (Physician Assistant) End, Harrell Gave, MD as PCP - Cardiology (Cardiology) Bary Castilla Forest Gleason, MD as Consulting Physician (General Surgery) Lequita Asal, MD as Referring Physician (Hematology and Oncology)   Name of the patient: Natasha Farrell  [id number]  1946/04/09   Date of visit: 12/19/20  Diagnosis- stage IV follicular lymphoma  Chief complaint/ Reason for visit-labs & evaluation after cycle 6 of R-CHOP chemotherapy  Heme/Onc history: Patient is a 75 year old female now transfer of care from Dr. Mike Gip.  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  She had presented with extensive adenopathy and massive splenomegaly.   Right axillary ultrasound guided biopsy on 04/21/2020 revealed fragments of lymph node with reactive follicular hyperplasia. There was no definite evidence of malignancy. Flow cytometry revealed a CD10+ B cell population. Clonality could not be evaluated due to non-specific light chain binding. There was no evidence of a T cell lymphoproliferative disorder.    PET scan on 08/12/2020 revealed widespread adenopathy, visceral involvement and signs of near diffuse, multifocal bony involvement, distribution most c/w lymphoma, Deauville category 5. There was ileal involvement as well. Peritoneal involvement was suggested as well with nodular changes about the peritoneum but without significant FDG uptake. There was paraspinal soft tissue without frank bony destruction. Scattered areas of sclerosis underestimated the degree of bony involvement. Abdominal ascites may be related to anasarca and or lymphatic congestion with marked enlargement of LEFT-sided pleural effusion and mild enlargement of small RIGHT-sided pleural fluid.  Left-sided pleural effusion has been attributed to follicular lymphoma and she has undergone multiple  thoracentesis in the past with the last one that was done in April 2022.   Bone marrow on 08/04/2020 revealed variably cellular bone marrow with trilineage hematopoiesis and involvement by an atypical lymphoid proliferation  The bone marrow core biopsy demonstrated involvement by an atypical lymphoid proliferation with paratrabecular lymphoid aggregates and  patchy infiltration of the bone marrow space with accompanying fibrosis. The infiltrate was composed of mixed B-cells and T-cells. At least a subset of the B-cells co-expressed CD10 and BCL6 without definitive BCL2 expression. Scattered larger CD20-positive larger B-cells were also present. Flow cytometry showed no definitive atypical B-cell population. No immunophenotypically aberrant T-cell population was identified. There were no increase in blasts. Monocytes showed heterogeneous CD56 expression.  Cytogenetics were normal (47, XX).   Patient was seen by me for the first time prior to cycle 5 of R-CHOP chemotherapy and did not get any interim scans after 3 cycles.  Plan is therefore to get a PET scan after 6 cycles of R-CHOP chemotherapy  Interval history- She continues to feel well. Fatigue is stable. Eating and drinking well. Going to start walking again for exercise. No other complaints today.   ECOG PS- 1 Pain scale- 0   Review of systems- Review of Systems  Constitutional:  Negative for chills, fever, malaise/fatigue and weight loss.  HENT:  Negative for congestion, ear discharge and nosebleeds.   Eyes:  Negative for blurred vision.  Respiratory:  Negative for cough, hemoptysis, sputum production, shortness of breath and wheezing.   Cardiovascular:  Negative for chest pain, palpitations, orthopnea and claudication.  Gastrointestinal:  Negative for abdominal pain, blood in stool, constipation, diarrhea, heartburn, melena, nausea and vomiting.  Genitourinary:  Negative for dysuria, flank pain, frequency, hematuria and urgency.   Musculoskeletal:  Negative for back pain, joint pain and myalgias.  Skin:  Negative for rash.  Neurological:  Negative for dizziness, tingling, focal weakness, seizures, weakness and headaches.  Endo/Heme/Allergies:  Does not bruise/bleed easily.  Psychiatric/Behavioral:  Negative for depression and suicidal ideas. The patient does not have insomnia.      Allergies  Allergen Reactions   Lisinopril Swelling    angioedema     Past Medical History:  Diagnosis Date   Hx of hysterectomy    Hyperlipidemia    Hypertension    Thyroid disease    rt thryroidectomy  09/25/2010     Past Surgical History:  Procedure Laterality Date   BREAST BIOPSY Right 04/21/2020   hydromark 3 [place] bx path pending   LYMPH NODE BIOPSY Left 08/01/2020   Procedure: LYMPH NODE BIOPSY;  Surgeon: Robert Bellow, MD;  Location: [facility] ORS;  Service: General;  Laterality: Left;   PORTACATH PLACEMENT Left 08/22/2020   Procedure: INSERTION PORT-A-CATH;  Surgeon: Robert Bellow, MD;  Location: [facility] ORS;  Service: General;  Laterality: Left;    Social History   Socioeconomic History   Marital status: Married    Spouse name: Not on file   Number of children: Not on file   Years of education: Not on file   Highest education level: Not on file  Occupational History   Not on file  Tobacco Use   Smoking status: Never   Smokeless tobacco: Never  Vaping Use   Vaping Use: Never used  Substance and Sexual Activity   Alcohol use: No   Drug use: No   Sexual activity: Not on file  Other Topics Concern   Not on file  Social History Narrative   Not on file   Social Determinants of Health   Financial Resource Strain: Not on file  Food Insecurity: Not on file  Transportation Needs: Not on file  Physical Activity: Not on file  Stress: Not on file  Social Connections: Not on file  Intimate Partner Violence: Not on file    Family History  Problem Relation Age of Onset   Prostate cancer Father    Breast  cancer Sister 80   Colon cancer Sister    Kidney cancer Sister      Current Outpatient Medications:    acetaminophen (TYLENOL) 500 MG tablet, Take 500 mg by mouth every 6 (six) hours as needed for moderate pain, fever or headache., Disp: , Rfl:    allopurinol (ZYLOPRIM) 300 MG tablet, Take 1 tablet (300 mg total) by mouth daily., Disp: 30 tablet, Rfl: 2   carvedilol (COREG) 25 MG tablet, Take 1 tablet (25 mg total) by mouth 2 (two) times daily., Disp: 180 tablet, Rfl: 3   cholecalciferol (VITAMIN D3) 25 MCG (1000 UNIT) tablet, Take 2,000 Units by mouth daily., Disp: , Rfl:    cyanocobalamin 1000 MCG tablet, Take 1,000 mcg by mouth daily., Disp: , Rfl:    feeding supplement (ENSURE ENLIVE / ENSURE PLUS) LIQD, Take 237 mLs by mouth 2 (two) times daily between meals., Disp: 237 mL, Rfl: 12   folic acid (FOLVITE) 1 MG tablet, Take 1 tablet (1 mg total) by mouth daily., Disp: 30 tablet, Rfl: 1   levothyroxine (SYNTHROID) 50 MCG tablet, Take 50 mcg by mouth daily before breakfast., Disp: , Rfl:    lidocaine-prilocaine (EMLA) cream, Apply 1 application topically as needed. Apply small amount to port site at least 1 hour prior to it being accessed, cover with plastic wrap, Disp: 30 g, Rfl: 1   loratadine (CLARITIN) 10 MG tablet, Take  10 mg by mouth daily as needed for allergies., Disp: , Rfl:    Melatonin 10 MG TABS, Take by mouth. Pt states she takes it every other day., Disp: , Rfl:    Multiple Vitamin (MULTIVITAMIN WITH MINERALS) TABS tablet, Take 1 tablet by mouth daily., Disp: , Rfl:    omeprazole (PRILOSEC) 20 MG capsule, Take 1 capsule (20 mg total) by mouth daily., Disp: 30 capsule, Rfl: 3   ondansetron (ZOFRAN) 8 MG tablet, Take 1 tablet (8 mg total) by mouth every 8 (eight) hours as needed for nausea or vomiting., Disp: 20 tablet, Rfl: 3   polyethylene glycol powder (GLYCOLAX/MIRALAX) 17 GM/SCOOP powder, Take by mouth daily as needed., Disp: , Rfl:    pravastatin (PRAVACHOL) 40 MG tablet,  Take 40 mg by mouth daily., Disp: , Rfl:    predniSONE (DELTASONE) 50 MG tablet, Sig: Take 2 tablets (100 mg total) by mouth daily for 5 days. Take with food on days 1-5 of chemotherapy, Disp: 10 tablet, Rfl: 0   prochlorperazine (COMPAZINE) 10 MG tablet, Take 1 tablet (10 mg total) by mouth every 6 (six) hours as needed (Nausea or vomiting)., Disp: 30 tablet, Rfl: 6   sodium chloride 1 g tablet, Take 1 tablet (1 g total) by mouth 2 (two) times daily with a meal., Disp: 120 tablet, Rfl: 0   spironolactone (ALDACTONE) 50 MG tablet, Take 0.5 tablet (25 mg) by mouth once daily , Disp: , Rfl:  No current facility-administered medications for this visit.  Facility-Administered Medications Ordered in Other Visits:    sodium chloride flush (NS) 0.9 % injection 10 mL, 10 mL, Intracatheter, PRN, Sindy Guadeloupe, MD, 10 mL at 12/10/20 0830  Physical exam:  Vitals:   12/19/20 0932  BP: 121/75  Pulse: 89  Resp: 18  Temp: 97.8 F (36.6 C)  Weight: 170 lb 8.4 oz (77.3 kg)   Physical Exam Constitutional:      General: She is not in acute distress.    Appearance: She is well-developed. She is not ill-appearing.  HENT:     Head: Atraumatic.     Nose: Nose normal.     Mouth/Throat:     Pharynx: No oropharyngeal exudate.  Eyes:     General: No scleral icterus.    Conjunctiva/sclera: Conjunctivae normal.  Cardiovascular:     Rate and Rhythm: Normal rate and regular rhythm.     Heart sounds: Normal heart sounds.  Pulmonary:     Effort: Pulmonary effort is normal.     Breath sounds: Normal breath sounds.  Abdominal:     General: Bowel sounds are normal. There is no distension.     Palpations: Abdomen is soft.     Tenderness: There is no abdominal tenderness.  Musculoskeletal:        General: No tenderness or deformity. Normal range of motion.     Cervical back: Normal range of motion and neck supple.  Skin:    General: Skin is warm and dry.     Coloration: Skin is not pale.  Neurological:      General: No focal deficit present.     Mental Status: She is alert and oriented to person, place, and time.  Psychiatric:        Mood and Affect: Mood normal.        Behavior: Behavior normal.     CMP Latest Ref Rng & Units 12/10/2020  Glucose 70 - 99 mg/dL 108(H)  BUN 8 - 23 mg/dL 9  Creatinine  0.44 - 1.00 mg/dL 0.90  Sodium 135 - 145 mmol/L 136  Potassium 3.5 - 5.1 mmol/L 4.0  Chloride 98 - 111 mmol/L 101  CO2 22 - 32 mmol/L 29  Calcium 8.9 - 10.3 mg/dL 9.6  Total Protein 6.5 - 8.1 g/dL 7.2  Total Bilirubin 0.3 - 1.2 mg/dL 0.5  Alkaline Phos 38 - 126 U/L 85  AST 15 - 41 U/L 35  ALT 0 - 44 U/L 27   CBC Latest Ref Rng & Units 12/19/2020  WBC 4.0 - 10.5 K/uL 3.6(L)  Hemoglobin 12.0 - 15.0 g/dL 10.2(L)  Hematocrit 36.0 - 46.0 % 30.4(L)  Platelets 150 - 400 K/uL 43(L)     Assessment and plan- Patient is a 75 y.o. female with stage IV follicular lymphoma, currently s/p cycle 6 of R-CHOP chemotherapy who returns to clinic for labs and consideration of IV fluids.   Eating and drinking well. Fatigue is stable.   ANC stable at 1.8. Continue neutropenic precautions. Continue allopurinol.   Thrombocytopenia- plt 43. No abnormal bleeding or bruising. Likely secondary to splenomegaly as well as chemotherapy. Monitor.   Anemia- hemoglobin 10.2. Stable. Normocytic. Monitor.   Will refer for evusheld. Orders placed.   No fluids today. Follow up as scheduled.    Visit Diagnosis 1. Follicular lymphoma grade II of extranodal and solid organ sites [facility])    Beckey Rutter, DNP, AGNP-C 12/19/2020 9:54 AM

## 2020-12-22 ENCOUNTER — Ambulatory Visit
Admission: RE | Admit: 2020-12-22 | Discharge: 2020-12-22 | Disposition: A | Discharge status: Home / Self Care | Payer: Medicare HMO | Source: Ambulatory Visit | Attending: Oncology | Admitting: Oncology

## 2020-12-22 ENCOUNTER — Other Ambulatory Visit: Payer: Self-pay

## 2020-12-22 DIAGNOSIS — D701 Agranulocytosis secondary to cancer chemotherapy: Secondary | ICD-10-CM | POA: Insufficient documentation

## 2020-12-22 DIAGNOSIS — C8219 Follicular lymphoma grade II, extranodal and solid organ sites: Secondary | ICD-10-CM | POA: Diagnosis present

## 2020-12-22 DIAGNOSIS — Z79899 Other long term (current) drug therapy: Secondary | ICD-10-CM | POA: Insufficient documentation

## 2020-12-22 DIAGNOSIS — Z5181 Encounter for therapeutic drug level monitoring: Secondary | ICD-10-CM | POA: Insufficient documentation

## 2020-12-22 DIAGNOSIS — T451X5A Adverse effect of antineoplastic and immunosuppressive drugs, initial encounter: Secondary | ICD-10-CM | POA: Diagnosis not present

## 2020-12-22 DIAGNOSIS — I10 Essential (primary) hypertension: Secondary | ICD-10-CM | POA: Diagnosis not present

## 2020-12-22 DIAGNOSIS — J90 Pleural effusion, not elsewhere classified: Secondary | ICD-10-CM | POA: Insufficient documentation

## 2020-12-22 DIAGNOSIS — E785 Hyperlipidemia, unspecified: Secondary | ICD-10-CM | POA: Insufficient documentation

## 2020-12-22 DIAGNOSIS — Z5111 Encounter for antineoplastic chemotherapy: Secondary | ICD-10-CM | POA: Diagnosis not present

## 2020-12-22 LAB — GLUCOSE, CAPILLARY: Glucose-Capillary: 98 mg/dL (ref 70–99)

## 2020-12-22 MED ORDER — FLUDEOXYGLUCOSE F - 18 (FDG) INJECTION
8.8000 | Freq: Once | INTRAVENOUS | Status: AC | PRN
  Administered 2020-12-22: 9.21 via INTRAVENOUS

## 2020-12-23 ENCOUNTER — Ambulatory Visit (HOSPITAL_BASED_OUTPATIENT_CLINIC_OR_DEPARTMENT_OTHER)
Admission: RE | Admit: 2020-12-23 | Discharge: 2020-12-23 | Disposition: A | Discharge status: Home / Self Care | Payer: Medicare HMO | Source: Ambulatory Visit | Attending: Oncology | Admitting: Oncology

## 2020-12-23 DIAGNOSIS — Z0189 Encounter for other specified special examinations: Secondary | ICD-10-CM

## 2020-12-23 DIAGNOSIS — Z79899 Other long term (current) drug therapy: Secondary | ICD-10-CM

## 2020-12-23 DIAGNOSIS — Z5181 Encounter for therapeutic drug level monitoring: Secondary | ICD-10-CM

## 2020-12-23 DIAGNOSIS — C8219 Follicular lymphoma grade II, extranodal and solid organ sites: Secondary | ICD-10-CM

## 2020-12-23 DIAGNOSIS — Z5111 Encounter for antineoplastic chemotherapy: Secondary | ICD-10-CM

## 2020-12-23 DIAGNOSIS — D701 Agranulocytosis secondary to cancer chemotherapy: Secondary | ICD-10-CM

## 2020-12-23 LAB — ECHOCARDIOGRAM COMPLETE
AR max vel: 2.64 cm2
AV Area VTI: 2.68 cm2
AV Area mean vel: 2.33 cm2
AV Mean grad: 2 mmHg
AV Peak grad: 3.7 mmHg
Ao pk vel: 0.96 m/s
Area-P 1/2: 4.6 cm2
S' Lateral: 3.29 cm

## 2020-12-23 NOTE — Progress Notes (Signed)
*  PRELIMINARY RESULTS* Echocardiogram 2D Echocardiogram has been performed.  Natasha Farrell 12/23/2020, 10:40 AM

## 2020-12-31 ENCOUNTER — Inpatient Hospital Stay: Payer: Medicare HMO | Attending: Oncology

## 2020-12-31 ENCOUNTER — Other Ambulatory Visit: Payer: Self-pay

## 2020-12-31 ENCOUNTER — Inpatient Hospital Stay (HOSPITAL_BASED_OUTPATIENT_CLINIC_OR_DEPARTMENT_OTHER): Payer: Medicare HMO | Admitting: Oncology

## 2020-12-31 ENCOUNTER — Encounter: Payer: Self-pay | Admitting: Oncology

## 2020-12-31 VITALS — BP 140/73 | HR 78 | Temp 97.3°F | Resp 16 | Wt 172.5 lb

## 2020-12-31 DIAGNOSIS — E790 Hyperuricemia without signs of inflammatory arthritis and tophaceous disease: Secondary | ICD-10-CM

## 2020-12-31 DIAGNOSIS — Z5181 Encounter for therapeutic drug level monitoring: Secondary | ICD-10-CM | POA: Diagnosis not present

## 2020-12-31 DIAGNOSIS — D701 Agranulocytosis secondary to cancer chemotherapy: Secondary | ICD-10-CM

## 2020-12-31 DIAGNOSIS — C8290 Follicular lymphoma, unspecified, unspecified site: Secondary | ICD-10-CM | POA: Insufficient documentation

## 2020-12-31 DIAGNOSIS — Z5111 Encounter for antineoplastic chemotherapy: Secondary | ICD-10-CM

## 2020-12-31 DIAGNOSIS — Z79899 Other long term (current) drug therapy: Secondary | ICD-10-CM

## 2020-12-31 DIAGNOSIS — C8219 Follicular lymphoma grade II, extranodal and solid organ sites: Secondary | ICD-10-CM | POA: Diagnosis not present

## 2020-12-31 DIAGNOSIS — Z79620 Long term (current) use of immunosuppressive biologic: Secondary | ICD-10-CM

## 2020-12-31 DIAGNOSIS — Z9221 Personal history of antineoplastic chemotherapy: Secondary | ICD-10-CM | POA: Insufficient documentation

## 2020-12-31 DIAGNOSIS — Z298 Encounter for other specified prophylactic measures: Secondary | ICD-10-CM | POA: Insufficient documentation

## 2020-12-31 LAB — COMPREHENSIVE METABOLIC PANEL
ALT: 31 U/L (ref 0–44)
AST: 38 U/L (ref 15–41)
Albumin: 4.7 g/dL (ref 3.5–5.0)
Alkaline Phosphatase: 86 U/L (ref 38–126)
Anion gap: 4 — ABNORMAL LOW (ref 5–15)
BUN: 10 mg/dL (ref 8–23)
CO2: 30 mmol/L (ref 22–32)
Calcium: 9.5 mg/dL (ref 8.9–10.3)
Chloride: 102 mmol/L (ref 98–111)
Creatinine, Ser: 0.72 mg/dL (ref 0.44–1.00)
GFR, Estimated: >60 mL/min (ref >60)
Glucose, Bld: 94 mg/dL (ref 70–99)
Potassium: 4 mmol/L (ref 3.5–5.1)
Sodium: 136 mmol/L (ref 135–145)
Total Bilirubin: 0.4 mg/dL (ref 0.3–1.2)
Total Protein: 7.5 g/dL (ref 6.5–8.1)

## 2020-12-31 LAB — CBC WITH DIFFERENTIAL/PLATELET
Abs Immature Granulocytes: 0.02 10*3/uL (ref 0.00–0.07)
Basophils Absolute: 0 10*3/uL (ref 0.0–0.1)
Basophils Relative: 0 %
Eosinophils Absolute: 0 10*3/uL (ref 0.0–0.5)
Eosinophils Relative: 0 %
HCT: 32.8 % — ABNORMAL LOW (ref 36.0–46.0)
Hemoglobin: 10.9 g/dL — ABNORMAL LOW (ref 12.0–15.0)
Immature Granulocytes: 1 %
Lymphocytes Relative: 30 %
Lymphs Abs: 1.1 10*3/uL (ref 0.7–4.0)
MCH: 30.1 pg (ref 26.0–34.0)
MCHC: 33.2 g/dL (ref 30.0–36.0)
MCV: 90.6 fL (ref 80.0–100.0)
Monocytes Absolute: 0.8 10*3/uL (ref 0.1–1.0)
Monocytes Relative: 22 %
Neutro Abs: 1.7 10*3/uL (ref 1.7–7.7)
Neutrophils Relative %: 47 %
Platelets: 95 10*3/uL — ABNORMAL LOW (ref 150–400)
RBC: 3.62 MIL/uL — ABNORMAL LOW (ref 3.87–5.11)
RDW: 18.6 % — ABNORMAL HIGH (ref 11.5–15.5)
WBC: 3.6 10*3/uL — ABNORMAL LOW (ref 4.0–10.5)
nRBC: 0 % (ref 0.0–0.2)

## 2020-12-31 NOTE — Progress Notes (Signed)
Calcium levels are high and PCP informed pt to speak to Dr. Janese Banks about continuing her D3 because they did not want to stop it unless Dr. Janese Banks said so.

## 2021-01-03 ENCOUNTER — Encounter: Payer: Self-pay | Admitting: Hematology and Oncology

## 2021-01-03 NOTE — Progress Notes (Signed)
Hematology/Oncology Consult note [facility]  Telephone:([phone] [phone]  Patient Care Team: [name], MD as PCP - General (Physician Assistant) End, Harrell Gave, MD as PCP - Cardiology (Cardiology) Bary Castilla Forest Gleason, MD as Consulting Physician (General Surgery) Lequita Asal, MD (Inactive) as Referring Physician (Hematology and Oncology)   Name of the patient: Natasha Farrell  [id number]  1945-07-22   Date of visit: 01/03/21  Diagnosis-stage IV follicular lymphoma  Chief complaint/ Reason for visit-discuss PET CT scan results and further management  Heme/Onc history: Patient is a 75 year old female now transfer of care from Dr. Mike Gip.  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  She had presented with extensive adenopathy and massive splenomegaly.   Right axillary ultrasound guided biopsy on 04/21/2020 revealed fragments of lymph node with reactive follicular hyperplasia. There was no definite evidence of malignancy. Flow cytometry revealed a CD10+ B cell population. Clonality could not be evaluated due to non-specific light chain binding. There was no evidence of a T cell lymphoproliferative disorder.      PET scan on 08/12/2020 revealed widespread adenopathy, visceral involvement and signs of near diffuse, multifocal bony involvement, distribution most c/w lymphoma, Deauville category 5. There was ileal involvement as well. Peritoneal involvement was suggested as well with nodular changes about the peritoneum but without significant FDG uptake. There was paraspinal soft tissue without frank bony destruction. Scattered areas of sclerosis underestimated the degree of bony involvement. Abdominal ascites may be related to anasarca and or lymphatic congestion with marked enlargement of LEFT-sided pleural effusion and mild enlargement of small RIGHT-sided pleural fluid.  Left-sided pleural effusion has  been attributed to follicular lymphoma and she has undergone multiple thoracentesis in the past with the last one that was done in April 2022.     Bone marrow on 08/04/2020 revealed variably cellular bone marrow with trilineage hematopoiesis and involvement by an atypical lymphoid proliferation  The bone marrow core biopsy demonstrated involvement by an atypical lymphoid proliferation with paratrabecular lymphoid aggregates and  patchy infiltration of the bone marrow space with accompanying fibrosis. The infiltrate was composed of mixed B-cells and T-cells. At least a subset of the B-cells co-expressed CD10 and BCL6 without definitive BCL2 expression. Scattered larger CD20-positive larger B-cells were also present. Flow cytometry showed no definitive atypical B-cell population. No immunophenotypically aberrant T-cell population was identified. There were no increase in blasts. Monocytes showed heterogeneous CD56 expression.  Cytogenetics were normal (40, XX).   Patient was seen by me for the first time prior to cycle 5 of R-CHOP chemotherapy and did not get any interim scans after 3 cycles.  Plan is therefore to get a PET scan after 6 cycles of R-CHOP chemotherapy  Scans after 6 cycles of R-CHOP chemotherapy showed decrease in the size of adenopathy both above and below the diaphragm with significant reduction/resolution in the abnormal hypermetabolic activity.  There was persistent low-level hypermetabolic activity noted in the abdominal and pelvic lymph nodes with an SUV between 1-3 but minimally about the background mediastinal blood pool activity.  Deauville 2/3  Interval history- Patient reports some fatigue but has otherwise tolerated treatment well  ECOG PS- 1 Pain scale- 0   Review of systems- Review of Systems  Constitutional:  Positive for malaise/fatigue. Negative for chills, fever and weight loss.  HENT:  Negative for congestion, ear discharge and nosebleeds.   Eyes:  Negative for  blurred vision.  Respiratory:  Negative for cough, hemoptysis, sputum production,  shortness of breath and wheezing.   Cardiovascular:  Negative for chest pain, palpitations, orthopnea and claudication.  Gastrointestinal:  Negative for abdominal pain, blood in stool, constipation, diarrhea, heartburn, melena, nausea and vomiting.  Genitourinary:  Negative for dysuria, flank pain, frequency, hematuria and urgency.  Musculoskeletal:  Negative for back pain, joint pain and myalgias.  Skin:  Negative for rash.  Neurological:  Negative for dizziness, tingling, focal weakness, seizures, weakness and headaches.  Endo/Heme/Allergies:  Does not bruise/bleed easily.  Psychiatric/Behavioral:  Negative for depression and suicidal ideas. The patient does not have insomnia.     Allergies  Allergen Reactions   Lisinopril Swelling    angioedema     Past Medical History:  Diagnosis Date   Hx of hysterectomy    Hyperlipidemia    Hypertension    Thyroid disease    rt thryroidectomy  09/25/2010     Past Surgical History:  Procedure Laterality Date   BREAST BIOPSY Right 04/21/2020   hydromark 3 [place] bx path pending   LYMPH NODE BIOPSY Left 08/01/2020   Procedure: LYMPH NODE BIOPSY;  Surgeon: Robert Bellow, MD;  Location: [facility] ORS;  Service: General;  Laterality: Left;   PORTACATH PLACEMENT Left 08/22/2020   Procedure: INSERTION PORT-A-CATH;  Surgeon: Robert Bellow, MD;  Location: [facility] ORS;  Service: General;  Laterality: Left;    Social History   Socioeconomic History   Marital status: Married    Spouse name: Not on file   Number of children: Not on file   Years of education: Not on file   Highest education level: Not on file  Occupational History   Not on file  Tobacco Use   Smoking status: Never   Smokeless tobacco: Never  Vaping Use   Vaping Use: Never used  Substance and Sexual Activity   Alcohol use: No   Drug use: No   Sexual activity: Not on file  Other Topics Concern    Not on file  Social History Narrative   Not on file   Social Determinants of Health   Financial Resource Strain: Not on file  Food Insecurity: Not on file  Transportation Needs: Not on file  Physical Activity: Not on file  Stress: Not on file  Social Connections: Not on file  Intimate Partner Violence: Not on file    Family History  Problem Relation Age of Onset   Prostate cancer Father    Breast cancer Sister 4   Colon cancer Sister    Kidney cancer Sister      Current Outpatient Medications:    acetaminophen (TYLENOL) 500 MG tablet, Take 500 mg by mouth every 6 (six) hours as needed for moderate pain, fever or headache., Disp: , Rfl:    allopurinol (ZYLOPRIM) 300 MG tablet, Take 1 tablet (300 mg total) by mouth daily., Disp: 30 tablet, Rfl: 2   carvedilol (COREG) 25 MG tablet, Take 1 tablet (25 mg total) by mouth 2 (two) times daily., Disp: 180 tablet, Rfl: 3   cholecalciferol (VITAMIN D3) 25 MCG (1000 UNIT) tablet, Take 2,000 Units by mouth daily., Disp: , Rfl:    cyanocobalamin 1000 MCG tablet, Take 1,000 mcg by mouth daily., Disp: , Rfl:    feeding supplement (ENSURE ENLIVE / ENSURE PLUS) LIQD, Take 237 mLs by mouth 2 (two) times daily between meals., Disp: 237 mL, Rfl: 12   folic acid (FOLVITE) 1 MG tablet, Take by mouth., Disp: , Rfl:    levothyroxine (SYNTHROID) 50 MCG tablet, Take 50 mcg  by mouth daily before breakfast., Disp: , Rfl:    lidocaine-prilocaine (EMLA) cream, Apply 1 application topically as needed. Apply small amount to port site at least 1 hour prior to it being accessed, cover with plastic wrap, Disp: 30 g, Rfl: 1   loratadine (CLARITIN) 10 MG tablet, Take 10 mg by mouth daily as needed for allergies., Disp: , Rfl:    Multiple Vitamin (MULTIVITAMIN WITH MINERALS) TABS tablet, Take 1 tablet by mouth daily., Disp: , Rfl:    omeprazole (PRILOSEC) 20 MG capsule, Take 1 capsule (20 mg total) by mouth daily., Disp: 30 capsule, Rfl: 3   pravastatin (PRAVACHOL)  40 MG tablet, Take 40 mg by mouth daily., Disp: , Rfl:    spironolactone (ALDACTONE) 50 MG tablet, Take 0.5 tablet (25 mg) by mouth once daily , Disp: , Rfl:    Melatonin 10 MG TABS, Take by mouth. Pt states she takes it every other day. (Patient not taking: Reported on 12/31/2020), Disp: , Rfl:    ondansetron (ZOFRAN) 8 MG tablet, Take 1 tablet (8 mg total) by mouth every 8 (eight) hours as needed for nausea or vomiting. (Patient not taking: Reported on 12/31/2020), Disp: 20 tablet, Rfl: 3   polyethylene glycol powder (GLYCOLAX/MIRALAX) 17 GM/SCOOP powder, Take by mouth daily as needed. (Patient not taking: Reported on 12/31/2020), Disp: , Rfl:    predniSONE (DELTASONE) 50 MG tablet, Sig: Take 2 tablets (100 mg total) by mouth daily for 5 days. Take with food on days 1-5 of chemotherapy (Patient not taking: Reported on 12/31/2020), Disp: 10 tablet, Rfl: 0   prochlorperazine (COMPAZINE) 10 MG tablet, Take 1 tablet (10 mg total) by mouth every 6 (six) hours as needed (Nausea or vomiting). (Patient not taking: Reported on 12/31/2020), Disp: 30 tablet, Rfl: 6   sodium chloride 1 g tablet, Take 1 tablet (1 g total) by mouth 2 (two) times daily with a meal. (Patient not taking: Reported on 12/31/2020), Disp: 120 tablet, Rfl: 0 No current facility-administered medications for this visit.  Facility-Administered Medications Ordered in Other Visits:    sodium chloride flush (NS) 0.9 % injection 10 mL, 10 mL, Intracatheter, PRN, Sindy Guadeloupe, MD, 10 mL at 12/10/20 0830  Physical exam:  Vitals:   12/31/20 1407  BP: 140/73  Pulse: 78  Resp: 16  Temp: (!) 97.3 F (36.3 C)  SpO2: 99%  Weight: 172 lb 8.2 oz (78.3 kg)   Physical Exam Constitutional:      General: She is not in acute distress. Cardiovascular:     Rate and Rhythm: Normal rate and regular rhythm.     Heart sounds: Normal heart sounds.  Pulmonary:     Effort: Pulmonary effort is normal.     Breath sounds: Normal breath sounds.  Abdominal:      General: Bowel sounds are normal.     Palpations: Abdomen is soft.     Comments: No palpable splenomegaly  Lymphadenopathy:     Comments: No palpable cervical, supraclavicular, axillary or inguinal adenopathy    Skin:    General: Skin is warm and dry.  Neurological:     Mental Status: She is alert and oriented to person, place, and time.     CMP Latest Ref Rng & Units 12/31/2020  Glucose 70 - 99 mg/dL 94  BUN 8 - 23 mg/dL 10  Creatinine 0.44 - 1.00 mg/dL 0.72  Sodium 135 - 145 mmol/L 136  Potassium 3.5 - 5.1 mmol/L 4.0  Chloride 98 - 111 mmol/L 102  CO2 22 - 32 mmol/L 30  Calcium 8.9 - 10.3 mg/dL 9.5  Total Protein 6.5 - 8.1 g/dL 7.5  Total Bilirubin 0.3 - 1.2 mg/dL 0.4  Alkaline Phos 38 - 126 U/L 86  AST 15 - 41 U/L 38  ALT 0 - 44 U/L 31   CBC Latest Ref Rng & Units 12/31/2020  WBC 4.0 - 10.5 K/uL 3.6(L)  Hemoglobin 12.0 - 15.0 g/dL 10.9(L)  Hematocrit 36.0 - 46.0 % 32.8(L)  Platelets 150 - 400 K/uL 95(L)    No images are attached to the encounter.  NM PET Image Restag (PS) Skull Base To Thigh  Result Date: 12/23/2020 CLINICAL DATA:  Subsequent treatment strategy for follicular lymphoma. EXAM: NUCLEAR MEDICINE PET SKULL BASE TO THIGH TECHNIQUE: 9.21 mCi F-18 FDG was injected intravenously. Full-ring PET imaging was performed from the skull base to thigh after the radiotracer. CT data was obtained and used for attenuation correction and anatomic localization. Fasting blood glucose: 98 mg/dl COMPARISON:  CT August 22, 2020 and PET-CT August 12, 2020. FINDINGS: Mediastinal blood pool activity: SUV max 2.3 Liver activity: SUV max 3.4 NECK: Resolution of the hypermetabolic lymphoid tissue in Waldeyer's ring and the lingual tonsils. Significant interval decrease in size of the now non metabolic cervical lymph nodes. For reference: -left cervical lymph node is now without measurable abnormal FDG avidity and measures 3 mm in short axis on image 25/3 previously measuring 3.6 x 2.6 with  a max SUV of 14.1. -previously indexed right level 2 cervical lymph node is now without measurable abnormal FDG avidity and is too small to accurately characterize previously measuring 2.7 cm. Decreased size of the thoracic inlet lymph nodes which are now without measurable residual hypermetabolic activity. For instance the previously indexed left supraclavicular lymph node now measures 3-4 mm on image 55/3 previously 13 mm. Incidental CT findings: Dental hardware. CHEST: Significant decrease in size of the mediastinal, hilar, axillary and subpectoral lymph nodes with interval resolution of the associated hypermetabolic activity. Index lymph nodes are as follows: -superior mediastinal lymph node is now without measurable abnormal hypermetabolic activity and now measuring 5 mm on image 62/3 previously measuring 1.6 cm with a max SUV of 12. -right axillary lymph node is now without measurable hypermetabolic activity now measuring 3 mm in short axis on image 60/3 previously measuring 1.8 cm with a max SUV of 12.3. Interval resolution of the hypermetabolic paraspinal soft tissue. Incidental CT findings: Left chest wall Port-A-Cath with tip in the SVC. Decreased size of now small left pleural effusion with persistent left basilar atelectasis. No right-sided pleural effusion. No new suspicious pulmonary nodules or masses. No focal consolidation. ABDOMEN/PELVIS: Interval resolution of the diffuse heterogeneous abnormal metabolic activity predominately involving the left lobe of the liver. No residual foci of abnormal hypermetabolic activity. Resolution of splenomegaly now with low level diffuse hypermetabolic activity with max SUV of 3.0, previous max SUV of 11.5. Decrease in size and hypermetabolic activity in the periportal common bulky retroperitoneal and pelvic lymph nodes. For reference: -decreased size of the rim of soft tissue which in cases the aorta now measuring approximately 1 cm in maximum thickness on image  159/3 with a max SUV of 2.37 previously measuring 2.2 cm with a max SUV of 14.2. -right inguinal lymph node now measures 6 mm with a max SUV of 1.2 previously measuring 2.2 cm with a max SUV of 15 Incidental CT findings: Unremarkable noncontrast appearance of the hepatic, pancreatic and splenic parenchyma. Nonobstructive left renal calculi. No  hydronephrosis. No evidence of bowel obstruction. Hysterectomy. Decreased mesenteric edema with resolution of the pelvic free fluid. SKELETON: No focal hypermetabolic activity to suggest skeletal metastasis. Mild low level hypermetabolic activity throughout the axial and proximal appendicular skeleton, favored to represent marrow reconstitution. Incidental CT findings: No aggressive lytic or blastic lesion of bone. Multifocal areas of bony sclerosis involving the thoracic spine, ribs and proximal right humerus, previously hypermetabolic on PET-CT. IMPRESSION: 1. Findings of excellent treatment response. No evidence of new or progressive hypermetabolic disease. 2. Significant decrease in size of the adenopathy above and below the diaphragm, with significant decreased/resolution of the associated abnormal hypermetabolic activity. Persistent low level hypermetabolic activity within abdominal and pelvic lymph nodes with a periaortic retroperitoneal lymph node demonstrating max SUV similar to minimally above that of background mediastinal blood pool (Deauville 2/3). 3. Resolution of splenomegaly and abnormal FDG uptake in the spleen, with resolution of the multifocal hepatic hypermetabolic foci. 4. Resolution of the multifocal areas of osseous hypermetabolic activity and hypermetabolic paraspinal soft tissue foci. Now with mild low level homogeneous hypermetabolic activity throughout the axial and proximal appendicular skeleton, favored to represent marrow reconstitution. 5. Decreased size of the now small left pleural effusion with adjacent atelectasis. Electronically Signed   By:  Dahlia Bailiff MD   On: 12/23/2020 12:56   ECHOCARDIOGRAM COMPLETE  Result Date: 12/23/2020    ECHOCARDIOGRAM REPORT   Patient Name:   NOLEEN BULICK Date of Exam: 12/23/2020 Medical Rec #:  [id number]      Height:       64.0 in Accession #:    [id number]     Weight:       170.5 lb Date of Birth:  15-Mar-1946     BSA:          1.828 m Patient Age:    75 years       BP:           121/75 mmHg Patient Gender: F              HR:           89 bpm. Exam Location:  [facility] Procedure: 2D Echo, Cardiac Doppler, Color Doppler and Strain Analysis Indications:     Z51.11 Encounter for antineoplastic chemotherapy                  Z51.81 Encounter for monitoring rixuximab therapy  History:         Patient has prior history of Echocardiogram examinations, most                  recent 07/24/2020. Risk Factors:Dyslipidemia and Hypertension.  Sonographer:     Sherrie Sport RDCS (AE) Referring Phys:  [id number] Weston Anna Britian Jentz Diagnosing Phys: Nelva Bush MD  Sonographer Comments: Global longitudinal strain was attempted. IMPRESSIONS  1. Left ventricular ejection fraction, by estimation, is 55 to 60%. The left ventricle has normal function. The left ventricle has no regional wall motion abnormalities. There is mild left ventricular hypertrophy. Left ventricular diastolic parameters are consistent with Grade I diastolic dysfunction (impaired relaxation). The average left ventricular global longitudinal strain is -16.2 %. The global longitudinal strain is normal.  2. Right ventricular systolic function is normal. The right ventricular size is normal. There is normal pulmonary artery systolic pressure.  3. The mitral valve is normal in structure. Mild mitral valve regurgitation. No evidence of mitral stenosis.  4. The aortic valve was not well visualized. Aortic valve regurgitation is not visualized. No  aortic stenosis is present.  5. The inferior vena cava is normal in size with greater than 50% respiratory variability, suggesting right atrial  pressure of 3 mmHg. FINDINGS  Left Ventricle: Left ventricular ejection fraction, by estimation, is 55 to 60%. The left ventricle has normal function. The left ventricle has no regional wall motion abnormalities. The average left ventricular global longitudinal strain is -16.2 %. The global longitudinal strain is normal. The left ventricular internal cavity size was normal in size. There is mild left ventricular hypertrophy. Left ventricular diastolic parameters are consistent with Grade I diastolic dysfunction (impaired relaxation). Right Ventricle: The right ventricular size is normal. No increase in right ventricular wall thickness. Right ventricular systolic function is normal. There is normal pulmonary artery systolic pressure. The tricuspid regurgitant velocity is 2.76 m/s, and  with an assumed right atrial pressure of 3 mmHg, the estimated right ventricular systolic pressure is [id number] mmHg. Left Atrium: Left atrial size was normal in size. Right Atrium: Right atrial size was normal in size. Pericardium: There is no evidence of pericardial effusion. Mitral Valve: The mitral valve is normal in structure. Mild mitral valve regurgitation. No evidence of mitral valve stenosis. Tricuspid Valve: The tricuspid valve is not well visualized. Tricuspid valve regurgitation is trivial. Aortic Valve: The aortic valve was not well visualized. Aortic valve regurgitation is not visualized. No aortic stenosis is present. Aortic valve mean gradient measures 2.0 mmHg. Aortic valve peak gradient measures 3.7 mmHg. Aortic valve area, by VTI measures 2.68 cm. Pulmonic Valve: The pulmonic valve was not well visualized. Pulmonic valve regurgitation is not visualized. No evidence of pulmonic stenosis. Aorta: The aortic root is normal in size and structure. Pulmonary Artery: The pulmonary artery is not well seen. Venous: The inferior vena cava is normal in size with greater than 50% respiratory variability, suggesting right atrial  pressure of 3 mmHg. IAS/Shunts: The interatrial septum was not well visualized.  LEFT VENTRICLE PLAX 2D LVIDd:         4.62 cm  Diastology LVIDs:         3.29 cm  LV e' medial:    7.18 cm/s LV PW:         1.05 cm  LV E/e' medial:  10.6 LV IVS:        0.87 cm  LV e' lateral:   8.16 cm/s LVOT diam:     2.00 cm  LV E/e' lateral: 9.3 LV SV:         54 LV SV Index:   30       2D Longitudinal Strain LVOT Area:     3.14 cm 2D Strain GLS Avg:     -16.2 %  RIGHT VENTRICLE RV Basal diam:  2.37 cm RV S prime:     13.40 cm/s TAPSE (M-mode): 3.2 cm LEFT ATRIUM             Index       RIGHT ATRIUM          Index LA diam:        2.40 cm 1.31 cm/m  RA Area:     9.38 cm LA Vol (A2C):   25.8 ml 14.11 ml/m RA Volume:   16.80 ml 9.19 ml/m LA Vol (A4C):   12.1 ml 6.62 ml/m LA Biplane Vol: 17.5 ml 9.57 ml/m  AORTIC VALVE                   PULMONIC VALVE AV Area (Vmax):    2.64  cm    PV Vmax:        0.52 m/s AV Area (Vmean):   2.33 cm    PV Peak grad:   1.1 mmHg AV Area (VTI):     2.68 cm    RVOT Peak grad: 1 mmHg AV Vmax:           96.10 cm/s AV Vmean:          69.100 cm/s AV VTI:            0.202 m AV Peak Grad:      3.7 mmHg AV Mean Grad:      2.0 mmHg LVOT Vmax:         80.90 cm/s LVOT Vmean:        51.300 cm/s LVOT VTI:          0.172 m LVOT/AV VTI ratio: 0.85  AORTA Ao Root diam: 2.60 cm MITRAL VALVE               TRICUSPID VALVE MV Area (PHT): 4.60 cm    TR Peak grad:   30.5 mmHg MV Decel Time: 165 msec    TR Vmax:        276.00 cm/s MV E velocity: 75.80 cm/s MV A velocity: 92.10 cm/s  SHUNTS MV E/A ratio:  0.82        Systemic VTI:  0.17 m                            Systemic Diam: 2.00 cm Nelva Bush MD Electronically signed by Nelva Bush MD Signature Date/Time: 12/23/2020/4:25:37 PM    Final      Assessment and plan- Patient is a 75 y.o. female with stage IV follicular lymphoma s/p 6 cycles of R-CHOP chemotherapy here for routine follow-up and discuss PET CT scan results and further management  I have  reviewed PET/CT scan images independently and discussed findings with the patient.   Patient which was excellent response to treatment.  She had bulky cervical adenopathy as well as thoracic inlet adenopathy which has resolved.  No measurable activity in the mediastinal lymph nodes and axillary lymph nodes.  Resolution of hypermetabolic paraspinal soft tissue.  Resolution of hypermetabolic activity in the spleen and liver.  There has been considerable decrease in the size of intra-abdominal lymph nodes which still show mild hypermetabolism about the level of mediastinal blood pool activity which is consistent with Deauville 2/3.  It is unlikely that these residual lymph nodes in the abdomen with minimal hypermetabolic activity represent any residual active disease.  I am inclined to monitor the lymph nodes with a repeat CT scan in about 3 months time.  We discussed that patient had aggressive presentation of her follicular lymphoma involving multistation adenopathy as well as paraspinal soft tissue liver and spleen involvement which has responded well to chemotherapy.R-CHOP chemotherapy was chosen as her induction regimen by previous oncologist.  We discussed the role of maintenance Rituxan which is typically given every 8 weeks for up to 2 years is not entirely clear.  Phase 3 prima study which looked at patients with untreated follicular lymphoma who were randomly assigned to maintenance Rituxan every 8 weeks for 24 months versus placebo showed higher rates of PFS at 36 months 75 versus 58%.  Longer PFS 10.5 years versus 4 years.  However there were overall higher grade of adverse related events in the Rituxan group including higher rate of infections.  Similar  survival and quality of life ratings.  It would be reasonable to try maintenance Rituxan in her case for 2 reasons:1.  Aggressive stage IV presentation 2.  Minimal persistent hypermetabolism in the intra-abdominal lymph nodes.  However Rituxan can be  associated with recurrent risks of infections and hypogammaglobulinemia.  We discussed getting a second opinion at [facility] before proceeding with maintenance rituxan Patient agrees for the same.I will see her back in 3 months with CT scan prior but sooner based on [facility] recommendations for maintenance rituxan  Will make arrangements for patient to receive evushield.   Expect anemia/ thrombocytopenia which is secondary to chemotherapy to improve over the next few weeks      Visit Diagnosis 1. Encounter for monitoring rituximab therapy   2. Follicular lymphoma grade II of extranodal and solid organ sites [facility])      Dr. Randa Evens, MD, MPH [facility] [facility] [id number] 01/03/2021 10:48 AM

## 2021-01-04 ENCOUNTER — Other Ambulatory Visit: Payer: Self-pay | Admitting: Oncology

## 2021-01-08 ENCOUNTER — Inpatient Hospital Stay: Payer: Medicare HMO | Attending: Hematology and Oncology | Admitting: Nurse Practitioner

## 2021-01-08 DIAGNOSIS — D849 Immunodeficiency, unspecified: Secondary | ICD-10-CM | POA: Diagnosis not present

## 2021-01-08 NOTE — Progress Notes (Signed)
[place] at [facility]  Virtual Visit Progress Note for Evusheld    I connected with Buena Irish on 01/08/21 at  2:20 PM EDT by telephone visit and verified that I am speaking with the correct person using two identifiers.   I discussed the limitations, risks, security and privacy concerns of performing an evaluation and management service by telemedicine and the availability of in-person appointments. I also discussed with the patient that there may be a patient responsible charge related to this service. The patient expressed understanding and agreed to proceed.   Other persons participating in the visit and their role in the encounter: None   Patient's location: home  Provider's location: [facility]   Chief Complaint: Immunocompromised, consideration of evusheld    Patient Care Team: [name], MD as PCP - General (Physician Assistant) End, Harrell Gave, MD as PCP - Cardiology (Cardiology) Bary Castilla, Forest Gleason, MD as Consulting Physician (General Surgery) Lequita Asal, MD (Inactive) as Referring Physician (Hematology and Oncology)   Name of the patient: Natasha Farrell  [id number]  Aug 02, 1945   Date of visit: 01/08/21  History of Presenting Illness- Patient present for evaluation and consideration of Evusheld. Patient is considered moderately to severely immunocompromised based on active cancer treatment or active cancer, history of organ transplant, having received a stem cell transplant within the last 2 years and taking immunosuppression, or active treatment with immunosuppressing medications.   Review of systems- Review of Systems  Constitutional:  Negative for chills, diaphoresis, fever, malaise/fatigue and weight loss.  HENT:  Negative for congestion, ear discharge, ear pain, hearing loss, nosebleeds, sinus pain, sore throat and tinnitus.   Eyes:  Negative for blurred vision, double vision, pain and redness.  Respiratory:  Negative for cough,  hemoptysis, sputum production, shortness of breath, wheezing and stridor.   Cardiovascular:  Negative for chest pain, palpitations and leg swelling.  Gastrointestinal:  Negative for abdominal pain, blood in stool, constipation, diarrhea, melena, nausea and vomiting.  Genitourinary:  Negative for dysuria and urgency.  Musculoskeletal:  Negative for back pain, falls, joint pain and myalgias.  Skin:  Negative for itching and rash.  Neurological:  Negative for dizziness, tingling, sensory change, loss of consciousness, weakness and headaches.  Endo/Heme/Allergies:  Negative for environmental allergies. Does not bruise/bleed easily.  Psychiatric/Behavioral:  Negative for depression. The patient is not nervous/anxious and does not have insomnia.   All other systems reviewed and are negative.   Allergies  Allergen Reactions   Lisinopril Swelling    angioedema    Past Medical History:  Diagnosis Date   Hx of hysterectomy    Hyperlipidemia    Hypertension    Thyroid disease    rt thryroidectomy  09/25/2010    Past Surgical History:  Procedure Laterality Date   BREAST BIOPSY Right 04/21/2020   hydromark 3 [place] bx path pending   LYMPH NODE BIOPSY Left 08/01/2020   Procedure: LYMPH NODE BIOPSY;  Surgeon: Robert Bellow, MD;  Location: [facility] ORS;  Service: General;  Laterality: Left;   PORTACATH PLACEMENT Left 08/22/2020   Procedure: INSERTION PORT-A-CATH;  Surgeon: Robert Bellow, MD;  Location: [facility] ORS;  Service: General;  Laterality: Left;    Social History   Socioeconomic History   Marital status: Married    Spouse name: Not on file   Number of children: Not on file   Years of education: Not on file   Highest education level: Not on file  Occupational History   Not on file  Tobacco Use   Smoking status: Never   Smokeless tobacco: Never  Vaping Use   Vaping Use: Never used  Substance and Sexual Activity   Alcohol use: No   Drug use: No   Sexual activity: Not on file   Other Topics Concern   Not on file  Social History Narrative   Not on file   Social Determinants of Health   Financial Resource Strain: Not on file  Food Insecurity: Not on file  Transportation Needs: Not on file  Physical Activity: Not on file  Stress: Not on file  Social Connections: Not on file  Intimate Partner Violence: Not on file    Immunization History  Administered Date(s) Administered   Influenza Inj Mdck Quad Pf 02/23/2017   Influenza, High Dose Seasonal PF 02/03/2018, 01/09/2019, 01/09/2019   Influenza,inj,Quad PF,6+ Mos 02/07/2020   Influenza-Unspecified 02/21/2014, 02/22/2015, 02/23/2017, 01/09/2019   Moderna Sars-Covid-2 Vaccination 07/21/2019, 08/18/2019, 03/17/2020   Pneumococcal Conjugate-13 02/22/2015, 12/22/2018   Pneumococcal Polysaccharide-23 09/11/2020   Pneumococcal-Unspecified 07/15/2008   Tdap 07/31/2015   Zoster, Live 09/07/2013    Family History  Problem Relation Age of Onset   Prostate cancer Father    Breast cancer Sister 42   Colon cancer Sister    Kidney cancer Sister      Current Outpatient Medications:    acetaminophen (TYLENOL) 500 MG tablet, Take 500 mg by mouth every 6 (six) hours as needed for moderate pain, fever or headache., Disp: , Rfl:    allopurinol (ZYLOPRIM) 300 MG tablet, Take 1 tablet (300 mg total) by mouth daily., Disp: 30 tablet, Rfl: 2   carvedilol (COREG) 25 MG tablet, Take 1 tablet (25 mg total) by mouth 2 (two) times daily., Disp: 180 tablet, Rfl: 3   cholecalciferol (VITAMIN D3) 25 MCG (1000 UNIT) tablet, Take 2,000 Units by mouth daily., Disp: , Rfl:    cyanocobalamin 1000 MCG tablet, Take 1,000 mcg by mouth daily., Disp: , Rfl:    feeding supplement (ENSURE ENLIVE / ENSURE PLUS) LIQD, Take 237 mLs by mouth 2 (two) times daily between meals., Disp: 237 mL, Rfl: 12   folic acid (FOLVITE) 1 MG tablet, Take by mouth., Disp: , Rfl:    levothyroxine (SYNTHROID) 50 MCG tablet, Take 50 mcg by mouth daily before  breakfast., Disp: , Rfl:    lidocaine-prilocaine (EMLA) cream, Apply 1 application topically as needed. Apply small amount to port site at least 1 hour prior to it being accessed, cover with plastic wrap, Disp: 30 g, Rfl: 1   loratadine (CLARITIN) 10 MG tablet, Take 10 mg by mouth daily as needed for allergies., Disp: , Rfl:    Melatonin 10 MG TABS, Take by mouth. Pt states she takes it every other day. (Patient not taking: Reported on 12/31/2020), Disp: , Rfl:    Multiple Vitamin (MULTIVITAMIN WITH MINERALS) TABS tablet, Take 1 tablet by mouth daily., Disp: , Rfl:    omeprazole (PRILOSEC) 20 MG capsule, TAKE 1 CAPSULE BY MOUTH EVERY DAY, Disp: 30 capsule, Rfl: 3   ondansetron (ZOFRAN) 8 MG tablet, Take 1 tablet (8 mg total) by mouth every 8 (eight) hours as needed for nausea or vomiting. (Patient not taking: Reported on 12/31/2020), Disp: 20 tablet, Rfl: 3   polyethylene glycol powder (GLYCOLAX/MIRALAX) 17 GM/SCOOP powder, Take by mouth daily as needed. (Patient not taking: Reported on 12/31/2020), Disp: , Rfl:    pravastatin (PRAVACHOL) 40 MG tablet, Take 40 mg by mouth daily., Disp: , Rfl:    predniSONE (DELTASONE)  50 MG tablet, Sig: Take 2 tablets (100 mg total) by mouth daily for 5 days. Take with food on days 1-5 of chemotherapy (Patient not taking: Reported on 12/31/2020), Disp: 10 tablet, Rfl: 0   prochlorperazine (COMPAZINE) 10 MG tablet, Take 1 tablet (10 mg total) by mouth every 6 (six) hours as needed (Nausea or vomiting). (Patient not taking: Reported on 12/31/2020), Disp: 30 tablet, Rfl: 6   sodium chloride 1 g tablet, Take 1 tablet (1 g total) by mouth 2 (two) times daily with a meal. (Patient not taking: Reported on 12/31/2020), Disp: 120 tablet, Rfl: 0   spironolactone (ALDACTONE) 50 MG tablet, Take 0.5 tablet (25 mg) by mouth once daily , Disp: , Rfl:  No current facility-administered medications for this visit.  Facility-Administered Medications Ordered in Other Visits:    sodium  chloride flush (NS) 0.9 % injection 10 mL, 10 mL, Intracatheter, PRN, Sindy Guadeloupe, MD, 10 mL at 12/10/20 0830  Physical exam: Exam limited due to telemedicine Physical Exam Constitutional:      General: She is not in acute distress. Pulmonary:     Effort: No respiratory distress.  Neurological:     Mental Status: She is alert and oriented to person, place, and time.  Psychiatric:        Mood and Affect: Mood normal.        Behavior: Behavior normal.     Assessment and plan- Patient is a 75 y.o. female who presents to consideration of Evusheld.   We discussed that patient was referred to receive Evusheld (Tixagevimab and Cilgavimab). The FDA has issued an emergency use authorization (EUA) for this injection. Evusheld is a pre-exposure prevention of COVID-19 infection in adults who are at high risk for severe disease. Today we reviewed patient's risk and eligibility for Evusheld. We discussed that consent is voluntary and patient can refuse at any time. An FDA fact sheet will be given to patient. We reviewed potential risks, benefits, and alternatives today. We reviewed that some risks and benefits are unknown. She wishes to proceed. Advised not to receive a covid vaccine until 2 weeks after receiving Evusheld. Advised that patient should receive Evusheld every 6 months. Advised that receiving Evusheld is not a guarantee in the prevention of getting Covid-19 and that patient should still follow other infection prevention measures.   Will send message to get patient scheduled for evusheld in the next few days.    Visit Diagnosis 1. Immunosuppression ([place])    I discussed the assessment and treatment plan with the patient. The patient was provided an opportunity to ask questions and all were answered. The patient agreed with the plan and demonstrated an understanding of the instructions.   The patient was advised to call back or seek an in-person evaluation if the symptoms worsen or if the  condition fails to improve as anticipated.   I spent 15 minutes on this telephone encounter.    Beckey Rutter, DNP, AGNP-C [facility] [place]: Dr. Janese Banks

## 2021-01-13 ENCOUNTER — Other Ambulatory Visit: Payer: Self-pay

## 2021-01-13 ENCOUNTER — Inpatient Hospital Stay: Payer: Medicare HMO

## 2021-01-13 VITALS — BP 122/70 | HR 71 | Temp 97.0°F | Resp 18

## 2021-01-13 DIAGNOSIS — Z9221 Personal history of antineoplastic chemotherapy: Secondary | ICD-10-CM | POA: Diagnosis not present

## 2021-01-13 DIAGNOSIS — C8290 Follicular lymphoma, unspecified, unspecified site: Secondary | ICD-10-CM | POA: Diagnosis present

## 2021-01-13 DIAGNOSIS — D849 Immunodeficiency, unspecified: Secondary | ICD-10-CM

## 2021-01-13 DIAGNOSIS — Z298 Encounter for other specified prophylactic measures: Secondary | ICD-10-CM | POA: Diagnosis not present

## 2021-01-13 MED ORDER — TIXAGEVIMAB (PART OF EVUSHELD) INJECTION
300.0000 mg | Freq: Once | INTRAMUSCULAR | Status: AC
  Administered 2021-01-13: 300 mg via INTRAMUSCULAR
  Filled 2021-01-13: qty 3

## 2021-01-13 MED ORDER — CILGAVIMAB (PART OF EVUSHELD) INJECTION
300.0000 mg | Freq: Once | INTRAMUSCULAR | Status: AC
  Administered 2021-01-13: 300 mg via INTRAMUSCULAR
  Filled 2021-01-13: qty 3

## 2021-02-03 NOTE — Progress Notes (Signed)
Cardiology Office Note:    Date:  02/06/2021   ID:  Natasha Farrell, DOB 08-23-1945, MRN [id number]  PCP:  [name], MD  [facility] Cardiologist:  Dr. Chuck Hint [facility] Electrophysiologist:  None   Referring MD: [name], MD   Chief Complaint: 3 month follow-up  History of Present Illness:    Natasha Farrell is a 75 y.o. female with a hx of HTN, HLD, thyroid disease s/p right thyroidectomy 2012, G2 stage IV follicular lymphoma.   In February 2022 the patient presented to [facility] for tachycardia.  She reported it started in January and she had 4 days of headache, fever, chills, cough, fatigue, night sweats, dizziness.  In the ED CT of the chest showed widespread lymphadenopathy in the neck, chest, and visualized portions of the upper abdomen along with marked splenomegaly, no PE.  Findings were concerning for lymphoma or leukemia.  EKG showed sinus tachycardia.     She was seen by Dr. Saunders Revel 07/23/2020 in the clinic patient reported elevated heart rates, worse with activity.  She also reported occasional chest tightness and shortness of breath.  She had been on metoprolol and escalation was tried although this caused worsening fatigue.  Heart rate was up to 139 bpm.  ER evaluation was recommended, although patient deferred.  An emergent echo was ordered.  Metoprolol was switched to Coreg.  Concern for underlying possible cancer causing symptoms.   Echo 07/24/2020 showed LVEF 60 to 65%, no wall motion abnormality, grade 1 diastolic dysfunction, RV systolic normal function, left pleural effusion.   Patient had biopsy of the lymph nodes, suspect stage III lymphoma.   Patient was seen 08/06/2020 and was doing well from a cardiac standpoint.  Rate was better at 106 bpm on Coreg.  Coreg was increased to 25 mg twice daily.   Seen by surgeon Dr. Tollie Pizza 08/12/2020 with shortness of breath.  Thoracentesis performed on 3/25 yielding 1.6 L pleural fluid.  Patient went back to the  hospital at night for worsening shortness of breath.  Chest x-ray showed reexpansion of pulmonary edema versus infiltrate in the left lower lobe.  She was given IV Lasix and discharged.   Port-A-Cath placement for 08/22/20   Patient was last seen 08/29/2020 and volume was stable. Arlyce Harman was decreased for hyponatremia. Amlodipine d/c'd for swelling. H/o angioedema on ACE, cautious with ARB.   She was seen 09/26/20 and was overall doing well. She had 1 syncopal episode, suspected vasovagal in the setting of recent chemo, anemia, poor sleep.  Heart monitor was ordered.   Seen 11/04/20 and heart monitor showed Heart monitor reviewed, it showed Predominantly sinus rhythm with rare PACs and PVCs.    Today, the patient reports she is overall doing well. BP a little up, had her medications this AM. No dizziness or lightheadedness. She is done with the chemo, she goes back November 3rd for another CT scan and has subsequent visit on the 16th. She also saw a specialist. Has gained some weight, which maybe why BP a little high. No chest pain or SOB. She is walking and feels good, no shortness of breath. BP at home 120s/50-60s.   Past Medical History:  Diagnosis Date   Hx of hysterectomy    Hyperlipidemia    Hypertension    Thyroid disease    rt thryroidectomy  09/25/2010    Past Surgical History:  Procedure Laterality Date   BREAST BIOPSY Right 04/21/2020   hydromark 3 [place] bx path pending  LYMPH NODE BIOPSY Left 08/01/2020   Procedure: LYMPH NODE BIOPSY;  Surgeon: Robert Bellow, MD;  Location: [facility] ORS;  Service: General;  Laterality: Left;   PORTACATH PLACEMENT Left 08/22/2020   Procedure: INSERTION PORT-A-CATH;  Surgeon: Robert Bellow, MD;  Location: [facility] ORS;  Service: General;  Laterality: Left;    Current Medications: Current Meds  Medication Sig   acetaminophen (TYLENOL) 500 MG tablet Take 500 mg by mouth every 6 (six) hours as needed for moderate pain, fever or headache.   allopurinol  (ZYLOPRIM) 300 MG tablet Take 1 tablet (300 mg total) by mouth daily.   carvedilol (COREG) 25 MG tablet Take 1 tablet (25 mg total) by mouth 2 (two) times daily.   cyanocobalamin 1000 MCG tablet Take 1,000 mcg by mouth daily.   feeding supplement (ENSURE ENLIVE / ENSURE PLUS) LIQD Take 237 mLs by mouth 2 (two) times daily between meals.   folic acid (FOLVITE) 1 MG tablet Take by mouth.   levothyroxine (SYNTHROID) 50 MCG tablet Take 50 mcg by mouth daily before breakfast.   lidocaine-prilocaine (EMLA) cream Apply 1 application topically as needed. Apply small amount to port site at least 1 hour prior to it being accessed, cover with plastic wrap   loratadine (CLARITIN) 10 MG tablet Take 10 mg by mouth daily as needed for allergies.   Melatonin 10 MG TABS Take by mouth. Pt states she takes it every other day.   Multiple Vitamin (MULTIVITAMIN WITH MINERALS) TABS tablet Take 1 tablet by mouth daily.   pravastatin (PRAVACHOL) 40 MG tablet Take 40 mg by mouth daily.   spironolactone (ALDACTONE) 50 MG tablet Take 0.5 tablet (25 mg) by mouth once daily    [DISCONTINUED] carvedilol (COREG) 25 MG tablet Take 1 tablet (25 mg total) by mouth 2 (two) times daily.     Allergies:   Lisinopril   Social History   Socioeconomic History   Marital status: Married    Spouse name: Not on file   Number of children: Not on file   Years of education: Not on file   Highest education level: Not on file  Occupational History   Not on file  Tobacco Use   Smoking status: Never   Smokeless tobacco: Never  Vaping Use   Vaping Use: Never used  Substance and Sexual Activity   Alcohol use: No   Drug use: No   Sexual activity: Not on file  Other Topics Concern   Not on file  Social History Narrative   Not on file   Social Determinants of Health   Financial Resource Strain: Not on file  Food Insecurity: Not on file  Transportation Needs: Not on file  Physical Activity: Not on file  Stress: Not on file   Social Connections: Not on file     Family History: The patient's family history includes Breast cancer (age of onset: 53) in her sister; Colon cancer in her sister; Kidney cancer in her sister; Prostate cancer in her father.  ROS:   Please see the history of present illness.     All other systems reviewed and are negative.  EKGs/Labs/Other Studies Reviewed:    The following studies were reviewed today:  Echo 07/24/20  1. Left ventricular ejection fraction, by estimation, is 60 to 65%. The  left ventricle has normal function. The left ventricle has no regional  wall motion abnormalities. Left ventricular diastolic parameters are  consistent with Grade I diastolic  dysfunction (impaired relaxation). The average left ventricular  global  longitudinal strain is -11.7 %. The global longitudinal strain is  abnormal.   2. Right ventricular systolic function is normal. The right ventricular  size is normal. There is mildly elevated pulmonary artery systolic  pressure. The estimated right ventricular systolic pressure is 123456 mmHg.   3. Left pleural effusion noted, 7 cm    Heart Monitor 09/2020   The patient was monitored for 13 days, 17 hours. The predominant rhythm was sinus with an average rate of 86 bpm (range 49-130 bpm and sinus). There were rare PACs and PVCs. A single episode of nonsustained ventricular tachycardia lasting 11 beats was observed with a maximum rate of 148 bpm. There were two atrial runs lasting up to 9 beats with a maximum rate of 150 bpm. No sustained arrhythmia or prolonged pause occurred. There were no patient triggered events.   Predominantly sinus rhythm with rare PACs and PVCs.  Two brief episodes of PSVT and one run of NSVT were noted.  EKG:  EKG is  ordered today.  The ekg ordered today demonstrates NSR, 64bpm, nonspecific T wave changes  Recent Labs: 08/16/2020: TSH 3.890 08/22/2020: B Natriuretic Peptide 86.4 11/04/2020: Magnesium 1.9 12/31/2020: ALT  31; BUN 10; Creatinine, Ser 0.72; Hemoglobin 10.9; Platelets 95; Potassium 4.0; Sodium 136  Recent Lipid Panel No results found for: CHOL, TRIG, HDL, CHOLHDL, VLDL, LDLCALC, LDLDIRECT   Physical Exam:    VS:  BP 140/80 (BP Location: Left Arm, Patient Position: Sitting, Cuff Size: Normal)   Pulse 64   Ht '5\' 4"'$  (1.626 m)   Wt 175 lb (79.4 kg)   SpO2 98%   BMI 30.04 kg/m     Wt Readings from Last 3 Encounters:  02/06/21 175 lb (79.4 kg)  12/31/20 172 lb 8.2 oz (78.3 kg)  12/19/20 170 lb 8.4 oz (77.3 kg)     GEN:  Well nourished, well developed in no acute distress HEENT: Normal NECK: No JVD; No carotid bruits LYMPHATICS: No lymphadenopathy CARDIAC: RRR, no murmurs, rubs, gallops RESPIRATORY:  Clear to auscultation without rales, wheezing or rhonchi  ABDOMEN: Soft, non-tender, non-distended MUSCULOSKELETAL:  No edema; No deformity  SKIN: Warm and dry NEUROLOGIC:  Alert and oriented x 3 PSYCHIATRIC:  Normal affect   ASSESSMENT:    1. Essential hypertension   2. Syncope and collapse   3. Hyponatremia   4. Shortness of breath   5. Sinus tachycardia    PLAN:    In order of problems listed above:  Syncope  She denies further episodes. Prior heart monitor showedPredominantly sinus rhythm with rare PACs and PVCs., 2 brief episodes of PSVT and one run of NSVT were noted. No further work-up at this time.  Hyponatremia Resolved with lower dose spironolactone.   Sinus tachycardia BP wnl today. EKG with SR with heart rate of 64bpm. Continue BB.   Recurrent pleural effusions/SOB This is resolved. She is ambulating without significant SOB.   Follicular lymphoma Normocytic Anemia/thrombocytopenia She finished chemotherapy. She is following with oncology.  HTN BP today 140/80. AT home she reports is 120s/70-80s. She is on Coreg '25mg'$  BID, will refill. Continue current medications.   Disposition: Follow up in 6 month(s) with MD/APP    Signed, Seyon Strader Ninfa Meeker, PA-C   02/06/2021 2:56 PM    [facility]

## 2021-02-06 ENCOUNTER — Encounter: Payer: Self-pay | Admitting: Medical

## 2021-02-06 ENCOUNTER — Ambulatory Visit (INDEPENDENT_AMBULATORY_CARE_PROVIDER_SITE_OTHER): Payer: Medicare HMO | Admitting: Medical

## 2021-02-06 ENCOUNTER — Other Ambulatory Visit: Payer: Self-pay

## 2021-02-06 VITALS — BP 140/80 | HR 64 | Ht 64.0 in | Wt 175.0 lb

## 2021-02-06 DIAGNOSIS — R0602 Shortness of breath: Secondary | ICD-10-CM

## 2021-02-06 DIAGNOSIS — E871 Hypo-osmolality and hyponatremia: Secondary | ICD-10-CM

## 2021-02-06 DIAGNOSIS — I10 Essential (primary) hypertension: Secondary | ICD-10-CM

## 2021-02-06 DIAGNOSIS — R000 Tachycardia, unspecified: Secondary | ICD-10-CM

## 2021-02-06 DIAGNOSIS — R55 Syncope and collapse: Secondary | ICD-10-CM

## 2021-02-06 MED ORDER — CARVEDILOL 25 MG PO TABS: 25.0000 mg | ORAL_TABLET | Freq: Two times a day (BID) | ORAL | 3 refills | Status: DC

## 2021-02-06 NOTE — Patient Instructions (Addendum)
Medication Instructions:  Please continue your current medications  *If you need a refill on your cardiac medications before your next appointment, please call your pharmacy*  Lab Work: None  Testing/Procedures: None  Follow-Up: At [facility], you and your health needs are our priority.  As part of our continuing mission to provide you with exceptional heart care, we have created designated Provider Care Teams.  These Care Teams include your primary Cardiologist (physician) and Advanced Practice Providers (APPs -  Physician Assistants and Nurse Practitioners) who all work together to provide you with the care you need, when you need it.  Your next appointment:   6 month(s)  The format for your next appointment:   In Person  Provider:   Nelva Bush, MD or Cadence Kathlen Mody, [place]

## 2021-02-11 ENCOUNTER — Other Ambulatory Visit: Payer: Self-pay | Admitting: *Deleted

## 2021-02-11 MED ORDER — LIDOCAINE-PRILOCAINE 2.5-2.5 % EX CREA: 1.0000 "application " | TOPICAL_CREAM | CUTANEOUS | 2 refills | Status: DC | PRN

## 2021-02-24 ENCOUNTER — Other Ambulatory Visit: Payer: Self-pay | Admitting: Physician Assistant

## 2021-02-24 DIAGNOSIS — Z1231 Encounter for screening mammogram for malignant neoplasm of breast: Secondary | ICD-10-CM

## 2021-03-24 ENCOUNTER — Other Ambulatory Visit: Payer: Self-pay | Admitting: *Deleted

## 2021-03-24 MED ORDER — FOLIC ACID 1 MG PO TABS: 1.0000 mg | ORAL_TABLET | Freq: Every day | ORAL | 1 refills | Status: DC

## 2021-03-26 ENCOUNTER — Other Ambulatory Visit: Payer: Self-pay

## 2021-03-26 ENCOUNTER — Ambulatory Visit
Admission: RE | Admit: 2021-03-26 | Discharge: 2021-03-26 | Disposition: A | Discharge status: Home / Self Care | Payer: Medicare HMO | Source: Ambulatory Visit | Attending: Oncology | Admitting: Oncology

## 2021-03-26 DIAGNOSIS — C8219 Follicular lymphoma grade II, extranodal and solid organ sites: Secondary | ICD-10-CM | POA: Diagnosis present

## 2021-03-26 LAB — POCT I-STAT CREATININE: Creatinine, Ser: 0.7 mg/dL (ref 0.44–1.00)

## 2021-03-26 MED ORDER — IOHEXOL 300 MG/ML  SOLN
100.0000 mL | Freq: Once | INTRAMUSCULAR | Status: AC | PRN
  Administered 2021-03-26: 100 mL via INTRAVENOUS

## 2021-03-30 ENCOUNTER — Ambulatory Visit
Admission: RE | Admit: 2021-03-30 | Discharge: 2021-03-30 | Disposition: A | Discharge status: Home / Self Care | Payer: Medicare HMO | Source: Ambulatory Visit | Attending: Physician Assistant | Admitting: Physician Assistant

## 2021-03-30 ENCOUNTER — Other Ambulatory Visit: Payer: Self-pay

## 2021-03-30 DIAGNOSIS — Z1231 Encounter for screening mammogram for malignant neoplasm of breast: Secondary | ICD-10-CM | POA: Insufficient documentation

## 2021-04-08 ENCOUNTER — Other Ambulatory Visit: Payer: Self-pay

## 2021-04-08 ENCOUNTER — Encounter: Payer: Self-pay | Admitting: Oncology

## 2021-04-08 ENCOUNTER — Other Ambulatory Visit: Payer: Medicare HMO

## 2021-04-08 ENCOUNTER — Inpatient Hospital Stay: Payer: Medicare HMO | Attending: Oncology

## 2021-04-08 ENCOUNTER — Ambulatory Visit: Payer: Medicare HMO | Admitting: Oncology

## 2021-04-08 ENCOUNTER — Inpatient Hospital Stay (HOSPITAL_BASED_OUTPATIENT_CLINIC_OR_DEPARTMENT_OTHER): Payer: Medicare HMO | Admitting: Oncology

## 2021-04-08 VITALS — BP 144/68 | HR 75 | Temp 97.4°F | Resp 16 | Wt 180.0 lb

## 2021-04-08 DIAGNOSIS — Z08 Encounter for follow-up examination after completed treatment for malignant neoplasm: Secondary | ICD-10-CM

## 2021-04-08 DIAGNOSIS — C8290 Follicular lymphoma, unspecified, unspecified site: Secondary | ICD-10-CM | POA: Insufficient documentation

## 2021-04-08 DIAGNOSIS — Z803 Family history of malignant neoplasm of breast: Secondary | ICD-10-CM | POA: Diagnosis not present

## 2021-04-08 DIAGNOSIS — Z5181 Encounter for therapeutic drug level monitoring: Secondary | ICD-10-CM

## 2021-04-08 DIAGNOSIS — Z9221 Personal history of antineoplastic chemotherapy: Secondary | ICD-10-CM | POA: Insufficient documentation

## 2021-04-08 DIAGNOSIS — Z8572 Personal history of non-Hodgkin lymphomas: Secondary | ICD-10-CM

## 2021-04-08 DIAGNOSIS — Z95828 Presence of other vascular implants and grafts: Secondary | ICD-10-CM

## 2021-04-08 DIAGNOSIS — Z79620 Long term (current) use of immunosuppressive biologic: Secondary | ICD-10-CM

## 2021-04-08 LAB — CBC WITH DIFFERENTIAL/PLATELET
Abs Immature Granulocytes: 0.01 10*3/uL (ref 0.00–0.07)
Basophils Absolute: 0 10*3/uL (ref 0.0–0.1)
Basophils Relative: 0 %
Eosinophils Absolute: 0 10*3/uL (ref 0.0–0.5)
Eosinophils Relative: 0 %
HCT: 39.8 % (ref 36.0–46.0)
Hemoglobin: 13.4 g/dL (ref 12.0–15.0)
Immature Granulocytes: 0 %
Lymphocytes Relative: 42 %
Lymphs Abs: 1.6 10*3/uL (ref 0.7–4.0)
MCH: 28.9 pg (ref 26.0–34.0)
MCHC: 33.7 g/dL (ref 30.0–36.0)
MCV: 85.8 fL (ref 80.0–100.0)
Monocytes Absolute: 0.8 10*3/uL (ref 0.1–1.0)
Monocytes Relative: 21 %
Neutro Abs: 1.4 10*3/uL — ABNORMAL LOW (ref 1.7–7.7)
Neutrophils Relative %: 37 %
Platelets: 75 10*3/uL — ABNORMAL LOW (ref 150–400)
RBC: 4.64 MIL/uL (ref 3.87–5.11)
RDW: 15.3 % (ref 11.5–15.5)
WBC: 3.9 10*3/uL — ABNORMAL LOW (ref 4.0–10.5)
nRBC: 0 % (ref 0.0–0.2)

## 2021-04-08 LAB — COMPREHENSIVE METABOLIC PANEL
ALT: 26 U/L (ref 0–44)
AST: 32 U/L (ref 15–41)
Albumin: 4.7 g/dL (ref 3.5–5.0)
Alkaline Phosphatase: 86 U/L (ref 38–126)
Anion gap: 8 (ref 5–15)
BUN: 10 mg/dL (ref 8–23)
CO2: 27 mmol/L (ref 22–32)
Calcium: 9.5 mg/dL (ref 8.9–10.3)
Chloride: 98 mmol/L (ref 98–111)
Creatinine, Ser: 0.89 mg/dL (ref 0.44–1.00)
GFR, Estimated: >60 mL/min (ref >60)
Glucose, Bld: 98 mg/dL (ref 70–99)
Potassium: 3.8 mmol/L (ref 3.5–5.1)
Sodium: 133 mmol/L — ABNORMAL LOW (ref 135–145)
Total Bilirubin: 0.5 mg/dL (ref 0.3–1.2)
Total Protein: 7.4 g/dL (ref 6.5–8.1)

## 2021-04-08 LAB — LACTATE DEHYDROGENASE: LDH: 184 U/L (ref 98–192)

## 2021-04-08 MED ORDER — HEPARIN SOD (PORK) LOCK FLUSH 100 UNIT/ML IV SOLN
500.0000 [IU] | Freq: Once | INTRAVENOUS | Status: AC
  Administered 2021-04-08: 500 [IU] via INTRAVENOUS
  Filled 2021-04-08: qty 5

## 2021-04-08 MED ORDER — SODIUM CHLORIDE 0.9% FLUSH
10.0000 mL | INTRAVENOUS | Status: DC | PRN
  Administered 2021-04-08: 10 mL via INTRAVENOUS
  Filled 2021-04-08: qty 10

## 2021-04-08 NOTE — Progress Notes (Signed)
Pt states that her left ear has been ringing at times going on a week; mostly at nighttime when she lays down (states she can hear a pulsing sound)

## 2021-04-08 NOTE — Progress Notes (Signed)
Hematology/Oncology Consult note [facility]  [phone] [phone]  Patient Care Team: [name], MD as PCP - General (Physician Assistant) Bary Castilla Forest Gleason, MD as Consulting Physician (General Surgery) Lequita Asal, MD (Inactive) as Referring Physician (Hematology and Oncology) End, Harrell Gave, MD as Consulting Physician (Cardiology) Sindy Guadeloupe, MD as Consulting Physician (Hematology and Oncology)   Name of the patient: Natasha Farrell  [id number]  25-Jul-1945   Date of visit: 04/08/21  Diagnosis- stage IV follicular lymphoma  Chief complaint/ Reason for visit-routine follow-up of follicular lymphoma currently in CR 1  Heme/Onc history: Patient is a 75 year old female now transfer of care from Dr. Mike Gip.  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  She had presented with extensive adenopathy and massive splenomegaly.   Right axillary ultrasound guided biopsy on 04/21/2020 revealed fragments of lymph node with reactive follicular hyperplasia. There was no definite evidence of malignancy. Flow cytometry revealed a CD10+ B cell population. Clonality could not be evaluated due to non-specific light chain binding. There was no evidence of a T cell lymphoproliferative disorder.      PET scan on 08/12/2020 revealed widespread adenopathy, visceral involvement and signs of near diffuse, multifocal bony involvement, distribution most c/w lymphoma, Deauville category 5. There was ileal involvement as well. Peritoneal involvement was suggested as well with nodular changes about the peritoneum but without significant FDG uptake. There was paraspinal soft tissue without frank bony destruction. Scattered areas of sclerosis underestimated the degree of bony involvement. Abdominal ascites may be related to anasarca and or lymphatic congestion with marked enlargement of LEFT-sided pleural effusion and mild  enlargement of small RIGHT-sided pleural fluid.  Left-sided pleural effusion has been attributed to follicular lymphoma and she has undergone multiple thoracentesis in the past with the last one that was done in April 2022.     Bone marrow on 08/04/2020 revealed variably cellular bone marrow with trilineage hematopoiesis and involvement by an atypical lymphoid proliferation  The bone marrow core biopsy demonstrated involvement by an atypical lymphoid proliferation with paratrabecular lymphoid aggregates and  patchy infiltration of the bone marrow space with accompanying fibrosis. The infiltrate was composed of mixed B-cells and T-cells. At least a subset of the B-cells co-expressed CD10 and BCL6 without definitive BCL2 expression. Scattered larger CD20-positive larger B-cells were also present. Flow cytometry showed no definitive atypical B-cell population. No immunophenotypically aberrant T-cell population was identified. There were no increase in blasts. Monocytes showed heterogeneous CD56 expression.  Cytogenetics were normal (25, XX).   Patient was seen by me for the first time prior to cycle 5 of R-CHOP chemotherapy and did not get any interim scans after 3 cycles.  Plan is therefore to get a PET scan after 6 cycles of R-CHOP chemotherapy   Scans after 6 cycles of R-CHOP chemotherapy showed decrease in the size of adenopathy both above and below the diaphragm with significant reduction/resolution in the abnormal hypermetabolic activity.  There was persistent low-level hypermetabolic activity noted in the abdominal and pelvic lymph nodes with an SUV between 1-3 but minimally about the background mediastinal blood pool activity.  Deauville 2/3  Patient also went for second opinion to [facility] and overall decision was made against maintenance Rituxan.  Interval history-currently patient reports feeling well.  Her appetite is improved and she has gained weight.  Denies any new lumps or bumps anywhere.  Denies  any unintentional weight loss, drenching night sweats  ECOG PS- 0 Pain  scale- 0   Review of systems- Review of Systems  Constitutional:  Negative for chills, fever, malaise/fatigue and weight loss.  HENT:  Negative for congestion, ear discharge and nosebleeds.   Eyes:  Negative for blurred vision.  Respiratory:  Negative for cough, hemoptysis, sputum production, shortness of breath and wheezing.   Cardiovascular:  Negative for chest pain, palpitations, orthopnea and claudication.  Gastrointestinal:  Negative for abdominal pain, blood in stool, constipation, diarrhea, heartburn, melena, nausea and vomiting.  Genitourinary:  Negative for dysuria, flank pain, frequency, hematuria and urgency.  Musculoskeletal:  Negative for back pain, joint pain and myalgias.  Skin:  Negative for rash.  Neurological:  Negative for dizziness, tingling, focal weakness, seizures, weakness and headaches.  Endo/Heme/Allergies:  Does not bruise/bleed easily.  Psychiatric/Behavioral:  Negative for depression and suicidal ideas. The patient does not have insomnia.      Allergies  Allergen Reactions   Lisinopril Swelling    angioedema     Past Medical History:  Diagnosis Date   Hx of hysterectomy    Hyperlipidemia    Hypertension    Thyroid disease    rt thryroidectomy  09/25/2010     Past Surgical History:  Procedure Laterality Date   BREAST BIOPSY Right 04/21/2020   hydromark 3 [place] bx FRAGMENTS OF LYMPH NODE WITH REACTIVE FOLLICULARHYPERPLASIA of the RIGHT axilla. There is no evidence of a T cell lymphoproliferative disorder   LYMPH NODE BIOPSY Left 08/01/2020   Procedure: LYMPH NODE BIOPSY;  Surgeon: Robert Bellow, MD;  Location: [facility] ORS;  Service: General;  Laterality: Left;   PORTACATH PLACEMENT Left 08/22/2020   Procedure: INSERTION PORT-A-CATH;  Surgeon: Robert Bellow, MD;  Location: [facility] ORS;  Service: General;  Laterality: Left;    Social History   Socioeconomic History    Marital status: Married    Spouse name: Not on file   Number of children: Not on file   Years of education: Not on file   Highest education level: Not on file  Occupational History   Not on file  Tobacco Use   Smoking status: Never   Smokeless tobacco: Never  Vaping Use   Vaping Use: Never used  Substance and Sexual Activity   Alcohol use: No   Drug use: No   Sexual activity: Not on file  Other Topics Concern   Not on file  Social History Narrative   Not on file   Social Determinants of Health   Financial Resource Strain: Not on file  Food Insecurity: Not on file  Transportation Needs: Not on file  Physical Activity: Not on file  Stress: Not on file  Social Connections: Not on file  Intimate Partner Violence: Not on file    Family History  Problem Relation Age of Onset   Prostate cancer Father    Breast cancer Sister 40   Colon cancer Sister    Kidney cancer Sister      Current Outpatient Medications:    acetaminophen (TYLENOL) 500 MG tablet, Take 500 mg by mouth every 6 (six) hours as needed for moderate pain, fever or headache., Disp: , Rfl:    carvedilol (COREG) 25 MG tablet, Take 1 tablet (25 mg total) by mouth 2 (two) times daily., Disp: 180 tablet, Rfl: 3   cyanocobalamin 1000 MCG tablet, Take 1,000 mcg by mouth daily., Disp: , Rfl:    feeding supplement (ENSURE ENLIVE / ENSURE PLUS) LIQD, Take 237 mLs by mouth 2 (two) times daily between meals., Disp: 237 mL,  Rfl: 12   folic acid (FOLVITE) 1 MG tablet, Take 1 tablet (1 mg total) by mouth daily., Disp: 90 tablet, Rfl: 1   levothyroxine (SYNTHROID) 50 MCG tablet, Take 50 mcg by mouth daily before breakfast., Disp: , Rfl:    lidocaine-prilocaine (EMLA) cream, Apply 1 application topically as needed. Apply small amount to port site at least 1 hour prior to it being accessed, cover with plastic wrap, Disp: 30 g, Rfl: 2   Melatonin 10 MG TABS, Take by mouth. Pt states she takes it every other day., Disp: , Rfl:     Multiple Vitamin (MULTIVITAMIN WITH MINERALS) TABS tablet, Take 1 tablet by mouth daily., Disp: , Rfl:    pravastatin (PRAVACHOL) 40 MG tablet, Take 40 mg by mouth daily., Disp: , Rfl:    spironolactone (ALDACTONE) 50 MG tablet, Take 0.5 tablet (25 mg) by mouth once daily , Disp: , Rfl:    allopurinol (ZYLOPRIM) 300 MG tablet, Take 1 tablet (300 mg total) by mouth daily. (Patient not taking: Reported on 04/08/2021), Disp: 30 tablet, Rfl: 2   cholecalciferol (VITAMIN D3) 25 MCG (1000 UNIT) tablet, Take 2,000 Units by mouth daily. (Patient not taking: No sig reported), Disp: , Rfl:    loratadine (CLARITIN) 10 MG tablet, Take 10 mg by mouth daily as needed for allergies. (Patient not taking: Reported on 04/08/2021), Disp: , Rfl:    omeprazole (PRILOSEC) 20 MG capsule, TAKE 1 CAPSULE BY MOUTH EVERY DAY (Patient not taking: No sig reported), Disp: 30 capsule, Rfl: 3   ondansetron (ZOFRAN) 8 MG tablet, Take 1 tablet (8 mg total) by mouth every 8 (eight) hours as needed for nausea or vomiting. (Patient not taking: No sig reported), Disp: 20 tablet, Rfl: 3   polyethylene glycol powder (GLYCOLAX/MIRALAX) 17 GM/SCOOP powder, Take by mouth daily as needed. (Patient not taking: No sig reported), Disp: , Rfl:    predniSONE (DELTASONE) 50 MG tablet, Sig: Take 2 tablets (100 mg total) by mouth daily for 5 days. Take with food on days 1-5 of chemotherapy (Patient not taking: No sig reported), Disp: 10 tablet, Rfl: 0   prochlorperazine (COMPAZINE) 10 MG tablet, Take 1 tablet (10 mg total) by mouth every 6 (six) hours as needed (Nausea or vomiting). (Patient not taking: No sig reported), Disp: 30 tablet, Rfl: 6   sodium chloride 1 g tablet, Take 1 tablet (1 g total) by mouth 2 (two) times daily with a meal. (Patient not taking: No sig reported), Disp: 120 tablet, Rfl: 0 No current facility-administered medications for this visit.  Facility-Administered Medications Ordered in Other Visits:    sodium chloride flush (NS)  0.9 % injection 10 mL, 10 mL, Intracatheter, PRN, Sindy Guadeloupe, MD, 10 mL at 12/10/20 0830  Physical exam:  Vitals:   04/08/21 1025  BP: (!) 144/68  Pulse: 75  Resp: 16  Temp: (!) 97.4 F (36.3 C)  SpO2: 98%  Weight: 180 lb (81.6 kg)   Physical Exam Constitutional:      General: She is not in acute distress. Cardiovascular:     Rate and Rhythm: Normal rate and regular rhythm.     Heart sounds: Normal heart sounds.  Pulmonary:     Effort: Pulmonary effort is normal.     Breath sounds: Normal breath sounds.  Abdominal:     General: Bowel sounds are normal.     Palpations: Abdomen is soft.  Lymphadenopathy:     Comments: No palpable cervical, supraclavicular, axillary or inguinal adenopathy  Skin:    General: Skin is warm and dry.  Neurological:     Mental Status: She is alert and oriented to person, place, and time.     CMP Latest Ref Rng & Units 04/08/2021  Glucose 70 - 99 mg/dL 98  BUN 8 - 23 mg/dL 10  Creatinine 0.44 - 1.00 mg/dL 0.89  Sodium 135 - 145 mmol/L 133(L)  Potassium 3.5 - 5.1 mmol/L 3.8  Chloride 98 - 111 mmol/L 98  CO2 22 - 32 mmol/L 27  Calcium 8.9 - 10.3 mg/dL 9.5  Total Protein 6.5 - 8.1 g/dL 7.4  Total Bilirubin 0.3 - 1.2 mg/dL 0.5  Alkaline Phos 38 - 126 U/L 86  AST 15 - 41 U/L 32  ALT 0 - 44 U/L 26   CBC Latest Ref Rng & Units 04/08/2021  WBC 4.0 - 10.5 K/uL 3.9(L)  Hemoglobin 12.0 - 15.0 g/dL 13.4  Hematocrit 36.0 - 46.0 % 39.8  Platelets 150 - 400 K/uL 75(L)    No images are attached to the encounter.  CT CHEST ABDOMEN PELVIS W CONTRAST  Result Date: 03/26/2021 CLINICAL DATA:  Follicular lymphoma, chemotherapy complete EXAM: CT CHEST, ABDOMEN, AND PELVIS WITH CONTRAST TECHNIQUE: Multidetector CT imaging of the chest, abdomen and pelvis was performed following the standard protocol during bolus administration of intravenous contrast. CONTRAST:  133mL OMNIPAQUE IOHEXOL 300 MG/ML SOLN, additional oral enteric contrast COMPARISON:   PET-CT, 12/22/2020, 08/12/2020 FINDINGS: CT CHEST FINDINGS Cardiovascular: Left chest port catheter. Aortic atherosclerosis. Normal heart size. Left coronary artery calcifications. No pericardial effusion. Mediastinum/Nodes: No enlarged mediastinal, hilar, or axillary lymph nodes. Status post right lobe thyroidectomy. Trachea, and esophagus demonstrate no significant findings. Lungs/Pleura: Mild, bandlike scarring and volume loss of the left lung base (series 5, image 114). Trace left pleural effusion, almost completely resolved in comparison to prior examination. Musculoskeletal: No chest wall mass or suspicious bone lesions identified. CT ABDOMEN PELVIS FINDINGS Hepatobiliary: No solid liver abnormality is seen. No gallstones, gallbladder wall thickening, or biliary dilatation. Pancreas: Unremarkable. No pancreatic ductal dilatation or surrounding inflammatory changes. Spleen: Normal in size without significant abnormality. Adrenals/Urinary Tract: Adrenal glands are unremarkable. Small nonobstructive calculus of the lateral midportion of the left kidney (series 2, image 62). Is unremarkable. Stomach/Bowel: Stomach is within normal limits. Appendix is not clearly visualized and may be surgically absent. No evidence of bowel wall thickening, distention, or inflammatory changes. Vascular/Lymphatic: Aortic atherosclerosis. No significant change in post treatment appearance of prominent, matted appearing retroperitoneal lymph nodes, index preaortic node measuring 1.4 x 0.8 cm (series 2, image 67). Reproductive: Status post hysterectomy. Other: No abdominal wall hernia or abnormality. No abdominopelvic ascites. Musculoskeletal: No acute or significant osseous findings. IMPRESSION: 1. No significant change in post treatment appearance of prominent, matted appearing retroperitoneal lymph nodes. 2. No persistently enlarged lymph nodes in the chest. 3. Trace left pleural effusion, almost completely resolved in comparison to  prior examination. 4. Findings are consistent with sustained treatment response. 5. Nonobstructive left nephrolithiasis. 6. Coronary artery disease. Aortic Atherosclerosis (ICD10-I70.0). Electronically Signed   By: Delanna Ahmadi M.D.   On: 03/26/2021 15:51   MM 3D SCREEN BREAST BILATERAL  Result Date: 03/30/2021 CLINICAL DATA:  Screening. EXAM: DIGITAL SCREENING BILATERAL MAMMOGRAM WITH TOMOSYNTHESIS AND CAD TECHNIQUE: Bilateral screening digital craniocaudal and mediolateral oblique mammograms were obtained. Bilateral screening digital breast tomosynthesis was performed. The images were evaluated with computer-aided detection. COMPARISON:  Previous exam(s). ACR Breast Density Category c: The breast tissue is heterogeneously dense, which may obscure  small masses. FINDINGS: There are no findings suspicious for malignancy. IMPRESSION: No mammographic evidence of malignancy. A result letter of this screening mammogram will be mailed directly to the patient. RECOMMENDATION: Screening mammogram in one year. (Code:SM-B-01Y) BI-RADS CATEGORY  1: Negative. Electronically Signed   By: Lillia Mountain M.D.   On: 03/30/2021 16:14    Assessment and plan- Patient is a 75 y.o. female  with stage IV follicular lymphoma s/p 6 cycles of R-CHOP.  She is currently in CR 1 and this is a routine follow-up visit  Recent CT chest abdomen and pelvis with contrast shows stable posttreatment appearance of retroperitoneal lymph nodes measuring 1.4 x 0.8 cm and no significant changes compared to prior PET.  No other persistently enlarged lymph nodes or pleural effusions noted.  Overall her follicular lymphoma is in remission and we will continue to do CT chest abdomen pelvis with contrast every 6 months.  I will see her back in 3 months with CBC with differential CMP and LDH   Visit Diagnosis 1. Encounter for follow-up surveillance of lymphoma      Dr. Randa Evens, MD, MPH [facility] [phone] 04/08/2021 3:33 PM

## 2021-07-10 ENCOUNTER — Encounter: Payer: Self-pay | Admitting: Hematology and Oncology

## 2021-07-10 NOTE — Telephone Encounter (Signed)
Signing encounter, See previous note 3/14

## 2021-07-14 ENCOUNTER — Inpatient Hospital Stay: Payer: Medicare HMO

## 2021-07-14 ENCOUNTER — Inpatient Hospital Stay: Payer: Medicare HMO | Attending: Oncology | Admitting: Oncology

## 2021-07-14 ENCOUNTER — Other Ambulatory Visit: Payer: Self-pay

## 2021-07-14 ENCOUNTER — Other Ambulatory Visit: Payer: Self-pay | Admitting: *Deleted

## 2021-07-14 ENCOUNTER — Encounter: Payer: Self-pay | Admitting: Oncology

## 2021-07-14 VITALS — BP 131/66 | HR 77 | Temp 97.8°F | Resp 16 | Ht 64.0 in | Wt 182.0 lb

## 2021-07-14 DIAGNOSIS — I10 Essential (primary) hypertension: Secondary | ICD-10-CM | POA: Insufficient documentation

## 2021-07-14 DIAGNOSIS — E079 Disorder of thyroid, unspecified: Secondary | ICD-10-CM | POA: Insufficient documentation

## 2021-07-14 DIAGNOSIS — C8288 Other types of follicular lymphoma, lymph nodes of multiple sites: Secondary | ICD-10-CM | POA: Diagnosis not present

## 2021-07-14 DIAGNOSIS — E790 Hyperuricemia without signs of inflammatory arthritis and tophaceous disease: Secondary | ICD-10-CM

## 2021-07-14 DIAGNOSIS — Z08 Encounter for follow-up examination after completed treatment for malignant neoplasm: Secondary | ICD-10-CM | POA: Diagnosis not present

## 2021-07-14 DIAGNOSIS — Z8572 Personal history of non-Hodgkin lymphomas: Secondary | ICD-10-CM | POA: Diagnosis not present

## 2021-07-14 DIAGNOSIS — D696 Thrombocytopenia, unspecified: Secondary | ICD-10-CM | POA: Diagnosis not present

## 2021-07-14 DIAGNOSIS — Z79899 Other long term (current) drug therapy: Secondary | ICD-10-CM | POA: Insufficient documentation

## 2021-07-14 LAB — CBC WITH DIFFERENTIAL/PLATELET
Abs Immature Granulocytes: 0.02 10*3/uL (ref 0.00–0.07)
Basophils Absolute: 0 10*3/uL (ref 0.0–0.1)
Basophils Relative: 0 %
Eosinophils Absolute: 0 10*3/uL (ref 0.0–0.5)
Eosinophils Relative: 0 %
HCT: 39.5 % (ref 36.0–46.0)
Hemoglobin: 13.4 g/dL (ref 12.0–15.0)
Immature Granulocytes: 0 %
Lymphocytes Relative: 34 %
Lymphs Abs: 1.7 10*3/uL (ref 0.7–4.0)
MCH: 30 pg (ref 26.0–34.0)
MCHC: 33.9 g/dL (ref 30.0–36.0)
MCV: 88.6 fL (ref 80.0–100.0)
Monocytes Absolute: 0.8 10*3/uL (ref 0.1–1.0)
Monocytes Relative: 16 %
Neutro Abs: 2.5 10*3/uL (ref 1.7–7.7)
Neutrophils Relative %: 50 %
Platelets: 86 10*3/uL — ABNORMAL LOW (ref 150–400)
RBC: 4.46 MIL/uL (ref 3.87–5.11)
RDW: 15.1 % (ref 11.5–15.5)
WBC: 5.1 10*3/uL (ref 4.0–10.5)
nRBC: 0 % (ref 0.0–0.2)

## 2021-07-14 LAB — COMPREHENSIVE METABOLIC PANEL
ALT: 22 U/L (ref 0–44)
AST: 31 U/L (ref 15–41)
Albumin: 4.8 g/dL (ref 3.5–5.0)
Alkaline Phosphatase: 84 U/L (ref 38–126)
Anion gap: 8 (ref 5–15)
BUN: 9 mg/dL (ref 8–23)
CO2: 27 mmol/L (ref 22–32)
Calcium: 9.8 mg/dL (ref 8.9–10.3)
Chloride: 101 mmol/L (ref 98–111)
Creatinine, Ser: 0.91 mg/dL (ref 0.44–1.00)
GFR, Estimated: >60 mL/min (ref >60)
Glucose, Bld: 114 mg/dL — ABNORMAL HIGH (ref 70–99)
Potassium: 3.7 mmol/L (ref 3.5–5.1)
Sodium: 136 mmol/L (ref 135–145)
Total Bilirubin: 0.7 mg/dL (ref 0.3–1.2)
Total Protein: 7.5 g/dL (ref 6.5–8.1)

## 2021-07-14 LAB — LACTATE DEHYDROGENASE: LDH: 146 U/L (ref 98–192)

## 2021-07-14 MED ORDER — SODIUM CHLORIDE 0.9% FLUSH
10.0000 mL | Freq: Once | INTRAVENOUS | Status: AC
  Administered 2021-07-14: 10 mL via INTRAVENOUS
  Filled 2021-07-14: qty 10

## 2021-07-14 MED ORDER — HEPARIN SOD (PORK) LOCK FLUSH 100 UNIT/ML IV SOLN
500.0000 [IU] | Freq: Once | INTRAVENOUS | Status: AC
  Administered 2021-07-14: 500 [IU] via INTRAVENOUS
  Filled 2021-07-14: qty 5

## 2021-07-14 NOTE — Progress Notes (Signed)
Hematology/Oncology Consult note [facility]  Telephone:([phone] [phone]  Patient Care Team: [name], MD as PCP - General (Physician Assistant) Bary Castilla Forest Gleason, MD as Consulting Physician (General Surgery) End, Harrell Gave, MD as Consulting Physician (Cardiology) Sindy Guadeloupe, MD as Consulting Physician (Hematology and Oncology)   Name of the patient: Natasha Farrell  [id number]  1945-06-19   Date of visit: 07/14/21  Diagnosis- stage IV follicular lymphoma  Chief complaint/ Reason for visit-routine follow-up of follicular lymphoma currently in CR 1  Heme/Onc history: Patient is a 76 year old female now transfer of care from Dr. Mike Gip.  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  She had presented with extensive adenopathy and massive splenomegaly.   Right axillary ultrasound guided biopsy on 04/21/2020 revealed fragments of lymph node with reactive follicular hyperplasia. There was no definite evidence of malignancy. Flow cytometry revealed a CD10+ B cell population. Clonality could not be evaluated due to non-specific light chain binding. There was no evidence of a T cell lymphoproliferative disorder.      PET scan on 08/12/2020 revealed widespread adenopathy, visceral involvement and signs of near diffuse, multifocal bony involvement, distribution most c/w lymphoma, Deauville category 5. There was ileal involvement as well. Peritoneal involvement was suggested as well with nodular changes about the peritoneum but without significant FDG uptake. There was paraspinal soft tissue without frank bony destruction. Scattered areas of sclerosis underestimated the degree of bony involvement. Abdominal ascites may be related to anasarca and or lymphatic congestion with marked enlargement of LEFT-sided pleural effusion and mild enlargement of small RIGHT-sided pleural fluid.  Left-sided pleural effusion has been  attributed to follicular lymphoma and she has undergone multiple thoracentesis in the past with the last one that was done in April 2022.     Bone marrow on 08/04/2020 revealed variably cellular bone marrow with trilineage hematopoiesis and involvement by an atypical lymphoid proliferation  The bone marrow core biopsy demonstrated involvement by an atypical lymphoid proliferation with paratrabecular lymphoid aggregates and  patchy infiltration of the bone marrow space with accompanying fibrosis. The infiltrate was composed of mixed B-cells and T-cells. At least a subset of the B-cells co-expressed CD10 and BCL6 without definitive BCL2 expression. Scattered larger CD20-positive larger B-cells were also present. Flow cytometry showed no definitive atypical B-cell population. No immunophenotypically aberrant T-cell population was identified. There were no increase in blasts. Monocytes showed heterogeneous CD56 expression.  Cytogenetics were normal (39, XX).   Patient was seen by me for the first time prior to cycle 5 of R-CHOP chemotherapy and did not get any interim scans after 3 cycles.  Plan is therefore to get a PET scan after 6 cycles of R-CHOP chemotherapy   Scans after 6 cycles of R-CHOP chemotherapy showed decrease in the size of adenopathy both above and below the diaphragm with significant reduction/resolution in the abnormal hypermetabolic activity.  There was persistent low-level hypermetabolic activity noted in the abdominal and pelvic lymph nodes with an SUV between 1-3 but minimally about the background mediastinal blood pool activity.  Deauville 2/3   Patient also went for second opinion to [facility] and overall decision was made against maintenance Rituxan.    Interval history-patient is doing well overall.  She is concerned about her weight gain.  Denies any cough or shortness of breath or lumps or bumps anywhere.  Appetite has remained stable.  ECOG PS- 0 Pain scale- 0   Review of  systems- Review of  Systems  Constitutional:  Negative for chills, fever, malaise/fatigue and weight loss.  HENT:  Negative for congestion, ear discharge and nosebleeds.   Eyes:  Negative for blurred vision.  Respiratory:  Negative for cough, hemoptysis, sputum production, shortness of breath and wheezing.   Cardiovascular:  Negative for chest pain, palpitations, orthopnea and claudication.  Gastrointestinal:  Negative for abdominal pain, blood in stool, constipation, diarrhea, heartburn, melena, nausea and vomiting.  Genitourinary:  Negative for dysuria, flank pain, frequency, hematuria and urgency.  Musculoskeletal:  Negative for back pain, joint pain and myalgias.  Skin:  Negative for rash.  Neurological:  Negative for dizziness, tingling, focal weakness, seizures, weakness and headaches.  Endo/Heme/Allergies:  Does not bruise/bleed easily.  Psychiatric/Behavioral:  Negative for depression and suicidal ideas. The patient does not have insomnia.      Allergies  Allergen Reactions   Lisinopril Swelling    angioedema     Past Medical History:  Diagnosis Date   Hx of hysterectomy    Hyperlipidemia    Hypertension    Thyroid disease    rt thryroidectomy  09/25/2010     Past Surgical History:  Procedure Laterality Date   BREAST BIOPSY Right 04/21/2020   hydromark 3 [place] bx FRAGMENTS OF LYMPH NODE WITH REACTIVE FOLLICULARHYPERPLASIA of the RIGHT axilla. There is no evidence of a T cell lymphoproliferative disorder   LYMPH NODE BIOPSY Left 08/01/2020   Procedure: LYMPH NODE BIOPSY;  Surgeon: Robert Bellow, MD;  Location: [facility] ORS;  Service: General;  Laterality: Left;   PORTACATH PLACEMENT Left 08/22/2020   Procedure: INSERTION PORT-A-CATH;  Surgeon: Robert Bellow, MD;  Location: [facility] ORS;  Service: General;  Laterality: Left;    Social History   Socioeconomic History   Marital status: Married    Spouse name: Not on file   Number of children: Not on file   Years of  education: Not on file   Highest education level: Not on file  Occupational History   Not on file  Tobacco Use   Smoking status: Never   Smokeless tobacco: Never  Vaping Use   Vaping Use: Never used  Substance and Sexual Activity   Alcohol use: No   Drug use: No   Sexual activity: Not on file  Other Topics Concern   Not on file  Social History Narrative   Not on file   Social Determinants of Health   Financial Resource Strain: Not on file  Food Insecurity: Not on file  Transportation Needs: Not on file  Physical Activity: Not on file  Stress: Not on file  Social Connections: Not on file  Intimate Partner Violence: Not on file    Family History  Problem Relation Age of Onset   Prostate cancer Father    Breast cancer Sister 71   Colon cancer Sister    Kidney cancer Sister      Current Outpatient Medications:    carvedilol (COREG) 25 MG tablet, Take 1 tablet (25 mg total) by mouth 2 (two) times daily., Disp: 180 tablet, Rfl: 3   cyanocobalamin 1000 MCG tablet, Take 1,000 mcg by mouth daily., Disp: , Rfl:    feeding supplement (ENSURE ENLIVE / ENSURE PLUS) LIQD, Take 237 mLs by mouth 2 (two) times daily between meals., Disp: 237 mL, Rfl: 12   folic acid (FOLVITE) 1 MG tablet, Take 1 tablet (1 mg total) by mouth daily., Disp: 90 tablet, Rfl: 1   levothyroxine (SYNTHROID) 50 MCG tablet, Take 50 mcg by mouth daily  before breakfast., Disp: , Rfl:    lidocaine-prilocaine (EMLA) cream, Apply 1 application topically as needed. Apply small amount to port site at least 1 hour prior to it being accessed, cover with plastic wrap, Disp: 30 g, Rfl: 2   loratadine (CLARITIN) 10 MG tablet, Take 10 mg by mouth daily as needed for allergies., Disp: , Rfl:    Multiple Vitamin (MULTIVITAMIN WITH MINERALS) TABS tablet, Take 1 tablet by mouth daily., Disp: , Rfl:    omeprazole (PRILOSEC) 20 MG capsule, TAKE 1 CAPSULE BY MOUTH EVERY DAY, Disp: 30 capsule, Rfl: 3   pravastatin (PRAVACHOL) 40 MG  tablet, Take 40 mg by mouth daily., Disp: , Rfl:    spironolactone (ALDACTONE) 50 MG tablet, Take 0.5 tablet (25 mg) by mouth once daily , Disp: , Rfl:    acetaminophen (TYLENOL) 500 MG tablet, Take 500 mg by mouth every 6 (six) hours as needed for moderate pain, fever or headache. (Patient not taking: Reported on 07/14/2021), Disp: , Rfl:    allopurinol (ZYLOPRIM) 300 MG tablet, Take 1 tablet (300 mg total) by mouth daily. (Patient not taking: Reported on 04/08/2021), Disp: 30 tablet, Rfl: 2   cholecalciferol (VITAMIN D3) 25 MCG (1000 UNIT) tablet, Take 2,000 Units by mouth daily. (Patient not taking: Reported on 07/14/2021), Disp: , Rfl:    Melatonin 10 MG TABS, Take by mouth. Pt states she takes it every other day. (Patient not taking: Reported on 07/14/2021), Disp: , Rfl:    ondansetron (ZOFRAN) 8 MG tablet, Take 1 tablet (8 mg total) by mouth every 8 (eight) hours as needed for nausea or vomiting. (Patient not taking: Reported on 12/31/2020), Disp: 20 tablet, Rfl: 3   polyethylene glycol powder (GLYCOLAX/MIRALAX) 17 GM/SCOOP powder, Take by mouth daily as needed. (Patient not taking: Reported on 12/31/2020), Disp: , Rfl:    predniSONE (DELTASONE) 50 MG tablet, Sig: Take 2 tablets (100 mg total) by mouth daily for 5 days. Take with food on days 1-5 of chemotherapy (Patient not taking: Reported on 12/31/2020), Disp: 10 tablet, Rfl: 0   prochlorperazine (COMPAZINE) 10 MG tablet, Take 1 tablet (10 mg total) by mouth every 6 (six) hours as needed (Nausea or vomiting). (Patient not taking: Reported on 12/31/2020), Disp: 30 tablet, Rfl: 6   sodium chloride 1 g tablet, Take 1 tablet (1 g total) by mouth 2 (two) times daily with a meal. (Patient not taking: Reported on 12/31/2020), Disp: 120 tablet, Rfl: 0 No current facility-administered medications for this visit.  Facility-Administered Medications Ordered in Other Visits:    sodium chloride flush (NS) 0.9 % injection 10 mL, 10 mL, Intracatheter, PRN, Sindy Guadeloupe, MD, 10 mL at 12/10/20 0830  Physical exam:  Vitals:   07/14/21 1033  BP: 131/66  Pulse: 77  Resp: 16  Temp: 97.8 F (36.6 C)  TempSrc: Tympanic  SpO2: 99%  Weight: 182 lb (82.6 kg)  Height: 5\' 4"  (1.626 m)   Physical Exam Constitutional:      General: She is not in acute distress. Cardiovascular:     Rate and Rhythm: Normal rate and regular rhythm.     Heart sounds: Normal heart sounds.  Pulmonary:     Effort: Pulmonary effort is normal.     Breath sounds: Normal breath sounds.  Abdominal:     General: Bowel sounds are normal.     Palpations: Abdomen is soft.     Comments: No palpable hepatosplenomegaly  Lymphadenopathy:     Comments: No palpable cervical, supraclavicular,  axillary or inguinal adenopathy    Skin:    General: Skin is warm and dry.  Neurological:     Mental Status: She is alert and oriented to person, place, and time.     CMP Latest Ref Rng & Units 07/14/2021  Glucose 70 - 99 mg/dL 114(H)  BUN 8 - 23 mg/dL 9  Creatinine 0.44 - 1.00 mg/dL 0.91  Sodium 135 - 145 mmol/L 136  Potassium 3.5 - 5.1 mmol/L 3.7  Chloride 98 - 111 mmol/L 101  CO2 22 - 32 mmol/L 27  Calcium 8.9 - 10.3 mg/dL 9.8  Total Protein 6.5 - 8.1 g/dL 7.5  Total Bilirubin 0.3 - 1.2 mg/dL 0.7  Alkaline Phos 38 - 126 U/L 84  AST 15 - 41 U/L 31  ALT 0 - 44 U/L 22   CBC Latest Ref Rng & Units 07/14/2021  WBC 4.0 - 10.5 K/uL 5.1  Hemoglobin 12.0 - 15.0 g/dL 13.4  Hematocrit 36.0 - 46.0 % 39.5  Platelets 150 - 400 K/uL 86(L)     Assessment and plan- Patient is a 76 y.o. female with stage IV follicular lymphoma s/p 6 cycles of R-CHOP currently in CR 1 and this is a routine follow-up visit  Clinically patient is doing well with no concerning signs and symptoms of recurrence based on today's exam.  No B symptoms, palpable splenomegaly or lymphadenopathy.  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  Overall platelet counts are stable which we will continue to monitor.  I will see her back in 3 months time with CT chest abdomen and pelvis with contrast and labs CBC with differential CMP and LDH.  She has a port in place which will be flushed every 3 months   Visit Diagnosis 1. Encounter for follow-up surveillance of lymphoma      Dr. Randa Evens, MD, MPH [facility] [phone] 07/14/2021 3:57 PM

## 2021-08-10 ENCOUNTER — Encounter: Payer: Self-pay | Admitting: Internal Medicine

## 2021-08-10 NOTE — Progress Notes (Signed)
? ?Follow-up Outpatient Visit ?Date: 08/12/2021 ? ?Primary Care Provider: ?[name], MD ?[place] [address] ? ?Chief Complaint: Follow-up tachycardia ? ?HPI:  Natasha Farrell is a 76 y.o. female with history of sinus tachycardia, hypertension, hyperlipidemia, follicular lymphoma, and thyroid disease status post right thyroidectomy (2012), who presents for follow-up of tachycardia and hypertension.  She was last seen in our office by [place], [place], in 01/2021, at which time she was doing well, having completed chemotherapy for lymphoma.  No medication changes or additional testing were pursued. ? ?Today, Natasha Farrell reports that she feels fairly well though she has noticed some persistent fatigue.  It actually gets better when she exercises.  She has been trying to walk more.  She denies chest pain, shortness of breath, and palpitations.  She endorses occasional orthostatic lightheadedness.  Home blood pressures are usually in the 952W systolic.  She reports that her lymphoma has been stable and that she remains under close surveillance by her oncology teams here and at [facility]. ? ?-------------------------------------------------------------------------------------------------- ? ?Past Medical History:  ?Diagnosis Date  ? Follicular lymphoma ([place]) 2022  ? Hx of hysterectomy   ? Hyperlipidemia   ? Hypertension   ? Thyroid disease   ? rt thryroidectomy  09/25/2010  ? ?Past Surgical History:  ?Procedure Laterality Date  ? BREAST BIOPSY Right 04/21/2020  ? hydromark 3 [place] bx FRAGMENTS OF LYMPH NODE WITH REACTIVE FOLLICULARHYPERPLASIA of the RIGHT axilla. There is no evidence of a T cell lymphoproliferative disorder  ? LYMPH NODE BIOPSY Left 08/01/2020  ? Procedure: LYMPH NODE BIOPSY;  Surgeon: Robert Bellow, MD;  Location: [facility] ORS;  Service: General;  Laterality: Left;  ? PORTACATH PLACEMENT Left 08/22/2020  ? Procedure: INSERTION PORT-A-CATH;  Surgeon: Robert Bellow, MD;  Location: [facility] ORS;  Service: General;  Laterality: Left;  ? ? ?Current Meds  ?Medication Sig  ? acetaminophen (TYLENOL) 500 MG tablet Take 500 mg by mouth every 6 (six) hours as needed for moderate pain, fever or headache.  ? carvedilol (COREG) 25 MG tablet Take 1 tablet (25 mg total) by mouth 2 (two) times daily.  ? cyanocobalamin 1000 MCG tablet Take 1,000 mcg by mouth daily.  ? feeding supplement (ENSURE ENLIVE / ENSURE PLUS) LIQD Take 237 mLs by mouth 2 (two) times daily between meals.  ? folic acid (FOLVITE) 1 MG tablet Take 1 tablet (1 mg total) by mouth daily.  ? levothyroxine (SYNTHROID) 50 MCG tablet Take 50 mcg by mouth daily before breakfast.  ? lidocaine-prilocaine (EMLA) cream Apply 1 application topically as needed. Apply small amount to port site at least 1 hour prior to it being accessed, cover with plastic wrap  ? loratadine (CLARITIN) 10 MG tablet Take 10 mg by mouth daily as needed for allergies.  ? Melatonin 10 MG TABS Take by mouth. Pt states she takes it every other day.  ? Multiple Vitamin (MULTIVITAMIN WITH MINERALS) TABS tablet Take 1 tablet by mouth daily.  ? pravastatin (PRAVACHOL) 40 MG tablet Take 40 mg by mouth daily.  ? spironolactone (ALDACTONE) 50 MG tablet Take 0.5 tablet (25 mg) by mouth once daily   ? ? ?Allergies: Lisinopril ? ?Social History  ? ?Tobacco Use  ? Smoking status: Never  ? Smokeless tobacco: Never  ?Vaping Use  ? Vaping Use: Never used  ?Substance Use Topics  ? Alcohol use: No  ? Drug use: No  ? ? ?Family History  ?Problem Relation Age  of Onset  ? Prostate cancer Father   ? Breast cancer Sister 96  ? Colon cancer Sister   ? Kidney cancer Sister   ? Breast cancer Sister   ? ? ?Review of Systems: ?A 12-system review of systems was performed and was negative except as noted in the HPI. ? ?-------------------------------------------------------------------------------------------------- ? ?Physical Exam: ?BP 140/84 (BP Location: Left Arm, Patient Position:  Sitting, Cuff Size: Normal)   Pulse 75   Ht '5\' 4"'$  (1.626 m)   Wt 181 lb (82.1 kg)   SpO2 99%   BMI 31.07 kg/m?  ? ?General:  NAD. ?Neck: No JVD or HJR. ?Lungs: Clear to auscultation bilaterally without wheezes or crackles. ?Heart: Regular rate and rhythm without murmurs, rubs, or gallops. ?Abdomen: Soft, nontender, nondistended. ?Extremities: No lower extremity edema. ? ?EKG: Normal sinus rhythm without significant abnormality.  No significant change from prior tracing on 02/06/2021. ? ?Lab Results  ?Component Value Date  ? WBC 5.1 07/14/2021  ? HGB 13.4 07/14/2021  ? HCT 39.5 07/14/2021  ? MCV 88.6 07/14/2021  ? PLT 86 (L) 07/14/2021  ? ? ?Lab Results  ?Component Value Date  ? NA 136 07/14/2021  ? K 3.7 07/14/2021  ? CL 101 07/14/2021  ? CO2 27 07/14/2021  ? BUN 9 07/14/2021  ? CREATININE 0.91 07/14/2021  ? GLUCOSE 114 (H) 07/14/2021  ? ALT 22 07/14/2021  ? ?-------------------------------------------------------------------------------------------------- ? ?ASSESSMENT AND PLAN: ?Sinus tachycardia: ?Heart rate well controlled today on current regimen of carvedilol 25 mg twice daily.  Defer further medication changes at this time, though we may need to consider de-escalation in the future if fatigue persist.  Most recent echo in 12/2020 showed preserved LVEF. ? ?Hypertension: ?Blood pressure borderline elevated today but typically better at home.  Defer medication changes today. ? ?Hyperlipidemia: ?Continue pravastatin with ongoing follow-up through PCP. ? ?Follicular lymphoma: ?Continue ongoing surveillance through oncology.  Patient is at risk for cardiomyopathy having received an anthracycline as part of R-CHOP.  Most recent echo in 12/2020 showed preserved LVEF. ? ?Follow-up: Return to clinic in 6 months. ? ?Nelva Bush, MD ?08/12/2021 ?10:28 AM ? ?

## 2021-08-12 ENCOUNTER — Encounter: Payer: Self-pay | Admitting: Internal Medicine

## 2021-08-12 ENCOUNTER — Ambulatory Visit: Payer: Medicare HMO | Admitting: Internal Medicine

## 2021-08-12 ENCOUNTER — Other Ambulatory Visit: Payer: Self-pay

## 2021-08-12 VITALS — BP 140/84 | HR 75 | Ht 64.0 in | Wt 181.0 lb

## 2021-08-12 DIAGNOSIS — E785 Hyperlipidemia, unspecified: Secondary | ICD-10-CM | POA: Diagnosis not present

## 2021-08-12 DIAGNOSIS — R000 Tachycardia, unspecified: Secondary | ICD-10-CM

## 2021-08-12 DIAGNOSIS — C8219 Follicular lymphoma grade II, extranodal and solid organ sites: Secondary | ICD-10-CM

## 2021-08-12 DIAGNOSIS — I10 Essential (primary) hypertension: Secondary | ICD-10-CM

## 2021-08-12 NOTE — Patient Instructions (Signed)
Medication Instructions:  ? ?Your physician recommends that you continue on your current medications as directed. Please refer to the Current Medication list given to you today. ? ?*If you need a refill on your cardiac medications before your next appointment, please call your pharmacy* ? ? ?Lab Work: ? ?None ordered ? ?Testing/Procedures: ? ?None ordered ? ? ?Follow-Up: ?At [facility], you and your health needs are our priority.  As part of our continuing mission to provide you with exceptional heart care, we have created designated Provider Care Teams.  These Care Teams include your primary Cardiologist (physician) and Advanced Practice Providers (APPs -  Physician Assistants and Nurse Practitioners) who all work together to provide you with the care you need, when you need it. ? ?We recommend signing up for the patient portal called "MyChart".  Sign up information is provided on this After Visit Summary.  MyChart is used to connect with patients for Virtual Visits (Telemedicine).  Patients are able to view lab/test results, encounter notes, upcoming appointments, etc.  Non-urgent messages can be sent to your provider as well.   ?To learn more about what you can do with MyChart, go to [email or web address].   ? ?Your next appointment:   ?6 month(s) ? ?The format for your next appointment:   ?In Person ? ?Provider:   ?You may see Dr. Harrell Gave End or one of the following Advanced Practice Providers on your designated Care Team:   ?Murray Hodgkins, NP ?Christell Faith, PA-C ?Cadence Kathlen Mody, PA-C ?

## 2021-09-18 ENCOUNTER — Other Ambulatory Visit: Payer: Self-pay | Admitting: Oncology

## 2021-09-22 ENCOUNTER — Other Ambulatory Visit: Payer: Self-pay | Admitting: Oncology

## 2021-09-28 ENCOUNTER — Other Ambulatory Visit: Payer: Self-pay | Admitting: *Deleted

## 2021-09-28 MED ORDER — FOLIC ACID 1 MG PO TABS: 1.0000 mg | ORAL_TABLET | Freq: Every day | ORAL | 1 refills | Status: DC

## 2021-09-30 ENCOUNTER — Encounter: Payer: Self-pay | Admitting: Hematology and Oncology

## 2021-10-05 ENCOUNTER — Ambulatory Visit
Admission: RE | Admit: 2021-10-05 | Discharge: 2021-10-05 | Disposition: A | Discharge status: Home / Self Care | Payer: Medicare HMO | Source: Ambulatory Visit | Attending: Oncology | Admitting: Oncology

## 2021-10-05 DIAGNOSIS — Z8572 Personal history of non-Hodgkin lymphomas: Secondary | ICD-10-CM | POA: Diagnosis present

## 2021-10-05 DIAGNOSIS — Z08 Encounter for follow-up examination after completed treatment for malignant neoplasm: Secondary | ICD-10-CM | POA: Diagnosis present

## 2021-10-05 LAB — POCT I-STAT CREATININE: Creatinine, Ser: 0.8 mg/dL (ref 0.44–1.00)

## 2021-10-05 MED ORDER — IOHEXOL 300 MG/ML  SOLN
100.0000 mL | Freq: Once | INTRAMUSCULAR | Status: AC | PRN
  Administered 2021-10-05: 100 mL via INTRAVENOUS

## 2021-10-12 ENCOUNTER — Encounter: Payer: Self-pay | Admitting: Oncology

## 2021-10-12 ENCOUNTER — Inpatient Hospital Stay: Payer: Medicare HMO | Attending: Oncology

## 2021-10-12 ENCOUNTER — Inpatient Hospital Stay: Payer: Medicare HMO | Admitting: Oncology

## 2021-10-12 VITALS — BP 127/80 | HR 70 | Temp 96.7°F | Resp 18 | Wt 182.8 lb

## 2021-10-12 DIAGNOSIS — Z08 Encounter for follow-up examination after completed treatment for malignant neoplasm: Secondary | ICD-10-CM

## 2021-10-12 DIAGNOSIS — Z79899 Other long term (current) drug therapy: Secondary | ICD-10-CM | POA: Diagnosis not present

## 2021-10-12 DIAGNOSIS — Z8572 Personal history of non-Hodgkin lymphomas: Secondary | ICD-10-CM | POA: Diagnosis not present

## 2021-10-12 DIAGNOSIS — C8280 Other types of follicular lymphoma, unspecified site: Secondary | ICD-10-CM | POA: Insufficient documentation

## 2021-10-12 DIAGNOSIS — Z95828 Presence of other vascular implants and grafts: Secondary | ICD-10-CM

## 2021-10-12 LAB — COMPREHENSIVE METABOLIC PANEL
ALT: 23 U/L (ref 0–44)
AST: 29 U/L (ref 15–41)
Albumin: 5 g/dL (ref 3.5–5.0)
Alkaline Phosphatase: 92 U/L (ref 38–126)
Anion gap: 7 (ref 5–15)
BUN: 9 mg/dL (ref 8–23)
CO2: 29 mmol/L (ref 22–32)
Calcium: 9.7 mg/dL (ref 8.9–10.3)
Chloride: 100 mmol/L (ref 98–111)
Creatinine, Ser: 0.63 mg/dL (ref 0.44–1.00)
GFR, Estimated: >60 mL/min (ref >60)
Glucose, Bld: 107 mg/dL — ABNORMAL HIGH (ref 70–99)
Potassium: 3.8 mmol/L (ref 3.5–5.1)
Sodium: 136 mmol/L (ref 135–145)
Total Bilirubin: 0.7 mg/dL (ref 0.3–1.2)
Total Protein: 7.8 g/dL (ref 6.5–8.1)

## 2021-10-12 LAB — CBC WITH DIFFERENTIAL/PLATELET
Abs Immature Granulocytes: 0.02 10*3/uL (ref 0.00–0.07)
Basophils Absolute: 0 10*3/uL (ref 0.0–0.1)
Basophils Relative: 1 %
Eosinophils Absolute: 0 10*3/uL (ref 0.0–0.5)
Eosinophils Relative: 0 %
HCT: 42 % (ref 36.0–46.0)
Hemoglobin: 14.2 g/dL (ref 12.0–15.0)
Immature Granulocytes: 0 %
Lymphocytes Relative: 32 %
Lymphs Abs: 1.7 10*3/uL (ref 0.7–4.0)
MCH: 30 pg (ref 26.0–34.0)
MCHC: 33.8 g/dL (ref 30.0–36.0)
MCV: 88.6 fL (ref 80.0–100.0)
Monocytes Absolute: 0.9 10*3/uL (ref 0.1–1.0)
Monocytes Relative: 17 %
Neutro Abs: 2.5 10*3/uL (ref 1.7–7.7)
Neutrophils Relative %: 50 %
Platelets: 90 10*3/uL — ABNORMAL LOW (ref 150–400)
RBC: 4.74 MIL/uL (ref 3.87–5.11)
RDW: 13.6 % (ref 11.5–15.5)
WBC: 5.2 10*3/uL (ref 4.0–10.5)
nRBC: 0 % (ref 0.0–0.2)

## 2021-10-12 LAB — LACTATE DEHYDROGENASE: LDH: 154 U/L (ref 98–192)

## 2021-10-12 MED ORDER — HEPARIN SOD (PORK) LOCK FLUSH 100 UNIT/ML IV SOLN
500.0000 [IU] | Freq: Once | INTRAVENOUS | Status: AC
  Administered 2021-10-12: 500 [IU] via INTRAVENOUS
  Filled 2021-10-12: qty 5

## 2021-10-12 MED ORDER — SODIUM CHLORIDE 0.9% FLUSH
10.0000 mL | Freq: Once | INTRAVENOUS | Status: AC
  Administered 2021-10-12: 10 mL via INTRAVENOUS
  Filled 2021-10-12: qty 10

## 2021-10-12 NOTE — Progress Notes (Signed)
Survivorship Care Plan visit completed.  Treatment summary reviewed and given to patient.  ASCO answers booklet reviewed and given to patient.  CARE program and Cancer Transitions discussed with patient along with other resources cancer center offers to patients and caregivers.  Patient verbalized understanding.    

## 2021-10-14 ENCOUNTER — Other Ambulatory Visit: Payer: Self-pay | Admitting: Medical

## 2021-10-15 ENCOUNTER — Encounter: Payer: Self-pay | Admitting: Hematology and Oncology

## 2021-10-15 NOTE — Progress Notes (Signed)
Hematology/Oncology Consult note [facility]  [phone] [phone]  Patient Care Team: [name], MD as PCP - General (Physician Assistant) Bary Castilla Forest Gleason, MD as Consulting Physician (General Surgery) End, Harrell Gave, MD as Consulting Physician (Cardiology) Sindy Guadeloupe, MD as Consulting Physician (Hematology and Oncology)   Name of the patient: Natasha Farrell  [id number]  1945-07-16   Date of visit: 10/15/21  Diagnosis-stage IV follicular lymphoma currently in remission  Chief complaint/ Reason for visit-routine follow-up of follicular lymphoma currently in CR 1  Heme/Onc history: Patient is a 76 year old female now transfer of care from Dr. Mike Gip.  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  She had presented with extensive adenopathy and massive splenomegaly.   Right axillary ultrasound guided biopsy on 04/21/2020 revealed fragments of lymph node with reactive follicular hyperplasia. There was no definite evidence of malignancy. Flow cytometry revealed a CD10+ B cell population. Clonality could not be evaluated due to non-specific light chain binding. There was no evidence of a T cell lymphoproliferative disorder.      PET scan on 08/12/2020 revealed widespread adenopathy, visceral involvement and signs of near diffuse, multifocal bony involvement, distribution most c/w lymphoma, Deauville category 5. There was ileal involvement as well. Peritoneal involvement was suggested as well with nodular changes about the peritoneum but without significant FDG uptake. There was paraspinal soft tissue without frank bony destruction. Scattered areas of sclerosis underestimated the degree of bony involvement. Abdominal ascites may be related to anasarca and or lymphatic congestion with marked enlargement of LEFT-sided pleural effusion and mild enlargement of small RIGHT-sided pleural fluid.  Left-sided  pleural effusion has been attributed to follicular lymphoma and she has undergone multiple thoracentesis in the past with the last one that was done in April 2022.     Bone marrow on 08/04/2020 revealed variably cellular bone marrow with trilineage hematopoiesis and involvement by an atypical lymphoid proliferation  The bone marrow core biopsy demonstrated involvement by an atypical lymphoid proliferation with paratrabecular lymphoid aggregates and  patchy infiltration of the bone marrow space with accompanying fibrosis. The infiltrate was composed of mixed B-cells and T-cells. At least a subset of the B-cells co-expressed CD10 and BCL6 without definitive BCL2 expression. Scattered larger CD20-positive larger B-cells were also present. Flow cytometry showed no definitive atypical B-cell population. No immunophenotypically aberrant T-cell population was identified. There were no increase in blasts. Monocytes showed heterogeneous CD56 expression.  Cytogenetics were normal (70, XX).   Patient was seen by me for the first time prior to cycle 5 of R-CHOP chemotherapy and did not get any interim scans after 3 cycles.  Plan is therefore to get a PET scan after 6 cycles of R-CHOP chemotherapy   Scans after 6 cycles of R-CHOP chemotherapy showed decrease in the size of adenopathy both above and below the diaphragm with significant reduction/resolution in the abnormal hypermetabolic activity.  There was persistent low-level hypermetabolic activity noted in the abdominal and pelvic lymph nodes with an SUV between 1-3 but minimally about the background mediastinal blood pool activity.  Deauville 2/3   Patient also went for second opinion to [facility] and overall decision was made against maintenance Rituxan.  Interval history-patient is doing well overall and denies any specific complaints at this time.  Appetite and weight have remained stable.  Denies any significant fatigue or unintentional weight loss or drenching  night sweats.  ECOG PS- 1 Pain scale- 0   Review of systems-  Review of Systems  Constitutional:  Negative for chills, fever, malaise/fatigue and weight loss.  HENT:  Negative for congestion, ear discharge and nosebleeds.   Eyes:  Negative for blurred vision.  Respiratory:  Negative for cough, hemoptysis, sputum production, shortness of breath and wheezing.   Cardiovascular:  Negative for chest pain, palpitations, orthopnea and claudication.  Gastrointestinal:  Negative for abdominal pain, blood in stool, constipation, diarrhea, heartburn, melena, nausea and vomiting.  Genitourinary:  Negative for dysuria, flank pain, frequency, hematuria and urgency.  Musculoskeletal:  Negative for back pain, joint pain and myalgias.  Skin:  Negative for rash.  Neurological:  Negative for dizziness, tingling, focal weakness, seizures, weakness and headaches.  Endo/Heme/Allergies:  Does not bruise/bleed easily.  Psychiatric/Behavioral:  Negative for depression and suicidal ideas. The patient does not have insomnia.      Allergies  Allergen Reactions   Lisinopril Swelling    angioedema     Past Medical History:  Diagnosis Date   Follicular lymphoma ([place]) 2022   Hx of hysterectomy    Hyperlipidemia    Hypertension    Thyroid disease    rt thryroidectomy  09/25/2010     Past Surgical History:  Procedure Laterality Date   BREAST BIOPSY Right 04/21/2020   hydromark 3 [place] bx FRAGMENTS OF LYMPH NODE WITH REACTIVE FOLLICULARHYPERPLASIA of the RIGHT axilla. There is no evidence of a T cell lymphoproliferative disorder   LYMPH NODE BIOPSY Left 08/01/2020   Procedure: LYMPH NODE BIOPSY;  Surgeon: Robert Bellow, MD;  Location: [facility] ORS;  Service: General;  Laterality: Left;   PORTACATH PLACEMENT Left 08/22/2020   Procedure: INSERTION PORT-A-CATH;  Surgeon: Robert Bellow, MD;  Location: [facility] ORS;  Service: General;  Laterality: Left;    Social History   Socioeconomic History   Marital  status: Married    Spouse name: Not on file   Number of children: Not on file   Years of education: Not on file   Highest education level: Not on file  Occupational History   Not on file  Tobacco Use   Smoking status: Never   Smokeless tobacco: Never  Vaping Use   Vaping Use: Never used  Substance and Sexual Activity   Alcohol use: No   Drug use: No   Sexual activity: Not on file  Other Topics Concern   Not on file  Social History Narrative   Not on file   Social Determinants of Health   Financial Resource Strain: Not on file  Food Insecurity: Not on file  Transportation Needs: Not on file  Physical Activity: Not on file  Stress: Not on file  Social Connections: Not on file  Intimate Partner Violence: Not on file    Family History  Problem Relation Age of Onset   Prostate cancer Father    Breast cancer Sister 42   Colon cancer Sister    Kidney cancer Sister    Breast cancer Sister      Current Outpatient Medications:    acetaminophen (TYLENOL) 500 MG tablet, Take 500 mg by mouth every 6 (six) hours as needed for moderate pain, fever or headache., Disp: , Rfl:    cyanocobalamin 1000 MCG tablet, Take 1,000 mcg by mouth daily., Disp: , Rfl:    feeding supplement (ENSURE ENLIVE / ENSURE PLUS) LIQD, Take 237 mLs by mouth 2 (two) times daily between meals., Disp: 237 mL, Rfl: 12   levothyroxine (SYNTHROID) 50 MCG tablet, Take 50 mcg by mouth daily before breakfast., Disp: ,  Rfl:    lidocaine-prilocaine (EMLA) cream, Apply 1 application topically as needed. Apply small amount to port site at least 1 hour prior to it being accessed, cover with plastic wrap, Disp: 30 g, Rfl: 2   loratadine (CLARITIN) 10 MG tablet, Take 10 mg by mouth daily as needed for allergies., Disp: , Rfl:    Melatonin 10 MG TABS, Take by mouth. Pt states she takes it every other day., Disp: , Rfl:    Multiple Vitamin (MULTIVITAMIN WITH MINERALS) TABS tablet, Take 1 tablet by mouth daily., Disp: , Rfl:     pravastatin (PRAVACHOL) 40 MG tablet, Take 40 mg by mouth daily., Disp: , Rfl:    spironolactone (ALDACTONE) 50 MG tablet, Take 0.5 tablet (25 mg) by mouth once daily , Disp: , Rfl:    carvedilol (COREG) 25 MG tablet, TAKE 1 TABLET BY MOUTH TWICE A DAY, Disp: 180 tablet, Rfl: 3   folic acid (FOLVITE) 1 MG tablet, Take 1 tablet (1 mg total) by mouth daily. (Patient not taking: Reported on 10/12/2021), Disp: 90 tablet, Rfl: 1 No current facility-administered medications for this visit.  Facility-Administered Medications Ordered in Other Visits:    sodium chloride flush (NS) 0.9 % injection 10 mL, 10 mL, Intracatheter, PRN, Sindy Guadeloupe, MD, 10 mL at 12/10/20 0830  Physical exam:  Vitals:   10/12/21 1053  BP: 127/80  Pulse: 70  Resp: 18  Temp: (!) 96.7 F (35.9 C)  SpO2: 97%  Weight: 182 lb 12.8 oz (82.9 kg)   Physical Exam Constitutional:      General: She is not in acute distress. Cardiovascular:     Rate and Rhythm: Normal rate and regular rhythm.     Heart sounds: Normal heart sounds.  Pulmonary:     Effort: Pulmonary effort is normal.     Breath sounds: Normal breath sounds.  Abdominal:     General: Bowel sounds are normal.     Palpations: Abdomen is soft.  Lymphadenopathy:     Comments: No palpable cervical, supraclavicular, axillary or inguinal adenopathy    Skin:    General: Skin is warm and dry.  Neurological:     Mental Status: She is alert and oriented to person, place, and time.        Latest Ref Rng & Units 10/12/2021   10:32 AM  CMP  Glucose 70 - 99 mg/dL 107    BUN 8 - 23 mg/dL 9    Creatinine 0.44 - 1.00 mg/dL 0.63    Sodium 135 - 145 mmol/L 136    Potassium 3.5 - 5.1 mmol/L 3.8    Chloride 98 - 111 mmol/L 100    CO2 22 - 32 mmol/L 29    Calcium 8.9 - 10.3 mg/dL 9.7    Total Protein 6.5 - 8.1 g/dL 7.8    Total Bilirubin 0.3 - 1.2 mg/dL 0.7    Alkaline Phos 38 - 126 U/L 92    AST 15 - 41 U/L 29    ALT 0 - 44 U/L 23        Latest Ref Rng &  Units 10/12/2021   10:32 AM  CBC  WBC 4.0 - 10.5 K/uL 5.2    Hemoglobin 12.0 - 15.0 g/dL 14.2    Hematocrit 36.0 - 46.0 % 42.0    Platelets 150 - 400 K/uL 90      No images are attached to the encounter.  CT ABDOMEN PELVIS W CONTRAST  Result Date: 10/06/2021 CLINICAL DATA:  Follicular lymphoma, restaging. * Tracking Code: BO * EXAM: CT ABDOMEN AND PELVIS WITH CONTRAST TECHNIQUE: Multidetector CT imaging of the abdomen and pelvis was performed using the standard protocol following bolus administration of intravenous contrast. RADIATION DOSE REDUCTION: This exam was performed according to the departmental dose-optimization program which includes automated exposure control, adjustment of the mA and/or kV according to patient size and/or use of iterative reconstruction technique. CONTRAST:  176m OMNIPAQUE IOHEXOL 300 MG/ML  SOLN COMPARISON:  Multiple priors including most recent CT March 26, 2021 FINDINGS: Lower chest: Similar trace left pleural effusion. Hepatobiliary: No solid enhancing hepatic lesion. Gallbladder is unremarkable. Prominence of the biliary tree with the common bile duct measuring 7 mm is similar to prior and within normal limits for patient's age likely reflecting senescent change. Pancreas: No pancreatic ductal dilation or evidence of acute inflammation. Spleen: No splenomegaly or focal splenic lesion. Adrenals/Urinary Tract: Bilateral adrenal glands appear normal. No hydronephrosis. Nonobstructive 2 mm left interpolar renal calculus. Kidneys demonstrate symmetric enhancement and excretion of contrast material. Urinary bladder is unremarkable for degree of distension. Stomach/Bowel: Radiopaque enteric contrast material traverses the splenic flexure. Mild symmetric wall thickening at the GE junction for instance on image 16/2. No pathologic dilation of small or large bowel. Terminal ileum appears normal. Appendix is not confidently identified may be surgically absent. No evidence of  acute bowel inflammation. Vascular/Lymphatic: Normal caliber abdominal aorta. Aortic atherosclerosis. No significant interval change in the post treatment appearance of the prominent, matted appearing retroperitoneal lymph nodes with the index preaortic lymph node measuring 13 x 7 mm on image 31/2 previously 14 x 7 mm when remeasured for consistency. No new or enlarging abdominal or pelvic lymph nodes identified. Reproductive: Status post hysterectomy. No adnexal masses. Other: Trace pelvic free fluid is new from prior. No discrete omental or peritoneal nodularity. Musculoskeletal: No aggressive lytic or blastic lesion of bone. Lower lumbar spondylosis. Degenerative changes bilateral SI joints with chronic osseous changes of the pubic symphysis. IMPRESSION: 1. No significant interval change in the post treatment appearance of the prominent, matted appearing retroperitoneal lymph nodes. No evidence of new or progressive disease in the abdomen or pelvis. 2. Trace pelvic free fluid, new from prior and nonspecific. 3. Mild symmetric wall thickening at the GE junction, nonspecific. Consider further evaluation with upper endoscopy versus attention on follow-up imaging. 4. Nonobstructive left nephrolithiasis. 5. Similar trace left pleural effusion. Electronically Signed   By: JDahlia BailiffM.D.   On: 10/06/2021 11:13     Assessment and plan- Patient is a 76y.o. female with stage IV follicular lymphoma s/p 6 cycles of R-CHOP currently in CR 1.  This is a routine follow-up visit  Clinically patient is doing well with no concerning signs and symptoms of recurrence based on today's exam.I will plan to get a repeat CT chest abdomen and pelvis with contrast in 6 months from now.  Her recent CT scan from 10/05/2021 did not reveal any evidence of progressive or recurrent disease.  I will see her back in 4 months with labs.  She has a port in place which will be flushed every 3 months   Visit Diagnosis 1. Encounter for  follow-up surveillance of lymphoma      Dr. ARanda Evens MD, MPH [id number]/25/2023 7:02 PM

## 2021-10-26 ENCOUNTER — Other Ambulatory Visit: Payer: Self-pay | Admitting: Oncology

## 2022-01-04 ENCOUNTER — Inpatient Hospital Stay: Payer: Medicare HMO | Attending: Oncology

## 2022-01-04 ENCOUNTER — Ambulatory Visit: Payer: Medicare HMO

## 2022-01-04 DIAGNOSIS — Z95828 Presence of other vascular implants and grafts: Secondary | ICD-10-CM

## 2022-01-04 DIAGNOSIS — C8280 Other types of follicular lymphoma, unspecified site: Secondary | ICD-10-CM | POA: Insufficient documentation

## 2022-01-04 MED ORDER — SODIUM CHLORIDE 0.9% FLUSH
10.0000 mL | Freq: Once | INTRAVENOUS | Status: AC
  Administered 2022-01-04: 10 mL via INTRAVENOUS
  Filled 2022-01-04: qty 10

## 2022-01-04 MED ORDER — HEPARIN SOD (PORK) LOCK FLUSH 100 UNIT/ML IV SOLN
500.0000 [IU] | Freq: Once | INTRAVENOUS | Status: AC
  Administered 2022-01-04: 500 [IU] via INTRAVENOUS
  Filled 2022-01-04: qty 5

## 2022-02-09 ENCOUNTER — Ambulatory Visit
Admission: RE | Admit: 2022-02-09 | Discharge: 2022-02-09 | Disposition: A | Discharge status: Home / Self Care | Payer: Medicare HMO | Source: Ambulatory Visit | Attending: Oncology | Admitting: Oncology

## 2022-02-09 DIAGNOSIS — Z8572 Personal history of non-Hodgkin lymphomas: Secondary | ICD-10-CM | POA: Insufficient documentation

## 2022-02-09 DIAGNOSIS — Z08 Encounter for follow-up examination after completed treatment for malignant neoplasm: Secondary | ICD-10-CM | POA: Insufficient documentation

## 2022-02-09 LAB — POCT I-STAT CREATININE: Creatinine, Ser: 0.8 mg/dL (ref 0.44–1.00)

## 2022-02-09 MED ORDER — IOHEXOL 300 MG/ML  SOLN
100.0000 mL | Freq: Once | INTRAMUSCULAR | Status: AC | PRN
  Administered 2022-02-09: 100 mL via INTRAVENOUS

## 2022-02-12 ENCOUNTER — Encounter: Payer: Self-pay | Admitting: Oncology

## 2022-02-12 ENCOUNTER — Other Ambulatory Visit: Payer: Self-pay

## 2022-02-12 ENCOUNTER — Inpatient Hospital Stay (HOSPITAL_BASED_OUTPATIENT_CLINIC_OR_DEPARTMENT_OTHER): Payer: Medicare HMO | Admitting: Oncology

## 2022-02-12 ENCOUNTER — Inpatient Hospital Stay: Payer: Medicare HMO | Attending: Oncology

## 2022-02-12 VITALS — BP 119/46 | HR 79 | Temp 96.9°F | Resp 18 | Wt 182.9 lb

## 2022-02-12 DIAGNOSIS — C8280 Other types of follicular lymphoma, unspecified site: Secondary | ICD-10-CM | POA: Insufficient documentation

## 2022-02-12 DIAGNOSIS — Z95828 Presence of other vascular implants and grafts: Secondary | ICD-10-CM

## 2022-02-12 DIAGNOSIS — Z08 Encounter for follow-up examination after completed treatment for malignant neoplasm: Secondary | ICD-10-CM

## 2022-02-12 DIAGNOSIS — Z8572 Personal history of non-Hodgkin lymphomas: Secondary | ICD-10-CM | POA: Diagnosis not present

## 2022-02-12 LAB — CBC WITH DIFFERENTIAL/PLATELET
Abs Immature Granulocytes: 0.03 10*3/uL (ref 0.00–0.07)
Basophils Absolute: 0 10*3/uL (ref 0.0–0.1)
Basophils Relative: 1 %
Eosinophils Absolute: 0 10*3/uL (ref 0.0–0.5)
Eosinophils Relative: 0 %
HCT: 39.5 % (ref 36.0–46.0)
Hemoglobin: 13.7 g/dL (ref 12.0–15.0)
Immature Granulocytes: 1 %
Lymphocytes Relative: 33 %
Lymphs Abs: 1.5 10*3/uL (ref 0.7–4.0)
MCH: 30.7 pg (ref 26.0–34.0)
MCHC: 34.7 g/dL (ref 30.0–36.0)
MCV: 88.6 fL (ref 80.0–100.0)
Monocytes Absolute: 0.8 10*3/uL (ref 0.1–1.0)
Monocytes Relative: 18 %
Neutro Abs: 2.2 10*3/uL (ref 1.7–7.7)
Neutrophils Relative %: 47 %
Platelets: 73 10*3/uL — ABNORMAL LOW (ref 150–400)
RBC: 4.46 MIL/uL (ref 3.87–5.11)
RDW: 13.6 % (ref 11.5–15.5)
WBC: 4.6 10*3/uL (ref 4.0–10.5)
nRBC: 0 % (ref 0.0–0.2)

## 2022-02-12 LAB — COMPREHENSIVE METABOLIC PANEL
ALT: 22 U/L (ref 0–44)
AST: 33 U/L (ref 15–41)
Albumin: 4.5 g/dL (ref 3.5–5.0)
Alkaline Phosphatase: 86 U/L (ref 38–126)
Anion gap: 5 (ref 5–15)
BUN: 7 mg/dL — ABNORMAL LOW (ref 8–23)
CO2: 28 mmol/L (ref 22–32)
Calcium: 9.2 mg/dL (ref 8.9–10.3)
Chloride: 106 mmol/L (ref 98–111)
Creatinine, Ser: 0.82 mg/dL (ref 0.44–1.00)
GFR, Estimated: >60 mL/min (ref >60)
Glucose, Bld: 108 mg/dL — ABNORMAL HIGH (ref 70–99)
Potassium: 3.7 mmol/L (ref 3.5–5.1)
Sodium: 139 mmol/L (ref 135–145)
Total Bilirubin: 0.6 mg/dL (ref 0.3–1.2)
Total Protein: 7.2 g/dL (ref 6.5–8.1)

## 2022-02-12 LAB — LACTATE DEHYDROGENASE: LDH: 150 U/L (ref 98–192)

## 2022-02-12 MED ORDER — HEPARIN SOD (PORK) LOCK FLUSH 100 UNIT/ML IV SOLN
500.0000 [IU] | Freq: Once | INTRAVENOUS | Status: AC
  Administered 2022-02-12: 500 [IU] via INTRAVENOUS
  Filled 2022-02-12: qty 5

## 2022-02-12 MED ORDER — SODIUM CHLORIDE 0.9% FLUSH
10.0000 mL | Freq: Once | INTRAVENOUS | Status: AC
  Administered 2022-02-12: 10 mL via INTRAVENOUS
  Filled 2022-02-12: qty 10

## 2022-02-12 MED ORDER — LIDOCAINE-PRILOCAINE 2.5-2.5 % EX CREA: 1.0000 | TOPICAL_CREAM | CUTANEOUS | 2 refills | Status: DC | PRN

## 2022-02-12 NOTE — Progress Notes (Signed)
Hematology/Oncology Consult note [facility]  Telephone:([phone] [phone]  Patient Care Team: [name], MD as PCP - General (Physician Assistant) Bary Castilla Forest Gleason, MD as Consulting Physician (General Surgery) End, Harrell Gave, MD as Consulting Physician (Cardiology) Sindy Guadeloupe, MD as Consulting Physician (Hematology and Oncology)   Name of the patient: Natasha Farrell  [id number]  02-09-1946   Date of visit: 02/12/22  Diagnosis- stage IV follicular lymphoma currently in remission  Chief complaint/ Reason for visit-routine follow-up of follicular lymphoma currently in CR 1  Heme/Onc history: Patient is a 76 year old female now transfer of care from Dr. Mike Gip.  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  She had presented with extensive adenopathy and massive splenomegaly.   Right axillary ultrasound guided biopsy on 04/21/2020 revealed fragments of lymph node with reactive follicular hyperplasia. There was no definite evidence of malignancy. Flow cytometry revealed a CD10+ B cell population. Clonality could not be evaluated due to non-specific light chain binding. There was no evidence of a T cell lymphoproliferative disorder.      PET scan on 08/12/2020 revealed widespread adenopathy, visceral involvement and signs of near diffuse, multifocal bony involvement, distribution most c/w lymphoma, Deauville category 5. There was ileal involvement as well. Peritoneal involvement was suggested as well with nodular changes about the peritoneum but without significant FDG uptake. There was paraspinal soft tissue without frank bony destruction. Scattered areas of sclerosis underestimated the degree of bony involvement. Abdominal ascites may be related to anasarca and or lymphatic congestion with marked enlargement of LEFT-sided pleural effusion and mild enlargement of small RIGHT-sided pleural fluid.  Left-sided  pleural effusion has been attributed to follicular lymphoma and she has undergone multiple thoracentesis in the past with the last one that was done in April 2022.     Bone marrow on 08/04/2020 revealed variably cellular bone marrow with trilineage hematopoiesis and involvement by an atypical lymphoid proliferation  The bone marrow core biopsy demonstrated involvement by an atypical lymphoid proliferation with paratrabecular lymphoid aggregates and  patchy infiltration of the bone marrow space with accompanying fibrosis. The infiltrate was composed of mixed B-cells and T-cells. At least a subset of the B-cells co-expressed CD10 and BCL6 without definitive BCL2 expression. Scattered larger CD20-positive larger B-cells were also present. Flow cytometry showed no definitive atypical B-cell population. No immunophenotypically aberrant T-cell population was identified. There were no increase in blasts. Monocytes showed heterogeneous CD56 expression.  Cytogenetics were normal (53, XX).   Patient was seen by me for the first time prior to cycle 5 of R-CHOP chemotherapy and did not get any interim scans after 3 cycles.  Plan is therefore to get a PET scan after 6 cycles of R-CHOP chemotherapy   Scans after 6 cycles of R-CHOP chemotherapy showed decrease in the size of adenopathy both above and below the diaphragm with significant reduction/resolution in the abnormal hypermetabolic activity.  There was persistent low-level hypermetabolic activity noted in the abdominal and pelvic lymph nodes with an SUV between 1-3 but minimally about the background mediastinal blood pool activity.  Deauville 2/3   Patient also went for second opinion to [facility] and overall decision was made against maintenance Rituxan.    Interval history-patient is doing well at this time and denies any specific complaints.  Appetite is good and she has been gaining weight.  ECOG PS- 0 Pain scale- 0   Review of systems- Review of Systems   Constitutional:  Negative for  chills, fever, malaise/fatigue and weight loss.  HENT:  Negative for congestion, ear discharge and nosebleeds.   Eyes:  Negative for blurred vision.  Respiratory:  Negative for cough, hemoptysis, sputum production, shortness of breath and wheezing.   Cardiovascular:  Negative for chest pain, palpitations, orthopnea and claudication.  Gastrointestinal:  Negative for abdominal pain, blood in stool, constipation, diarrhea, heartburn, melena, nausea and vomiting.  Genitourinary:  Negative for dysuria, flank pain, frequency, hematuria and urgency.  Musculoskeletal:  Negative for back pain, joint pain and myalgias.  Skin:  Negative for rash.  Neurological:  Negative for dizziness, tingling, focal weakness, seizures, weakness and headaches.  Endo/Heme/Allergies:  Does not bruise/bleed easily.  Psychiatric/Behavioral:  Negative for depression and suicidal ideas. The patient does not have insomnia.       Allergies  Allergen Reactions   Lisinopril Swelling    angioedema     Past Medical History:  Diagnosis Date   Follicular lymphoma ([place]) 2022   Hx of hysterectomy    Hyperlipidemia    Hypertension    Thyroid disease    rt thryroidectomy  09/25/2010     Past Surgical History:  Procedure Laterality Date   BREAST BIOPSY Right 04/21/2020   hydromark 3 [place] bx FRAGMENTS OF LYMPH NODE WITH REACTIVE FOLLICULARHYPERPLASIA of the RIGHT axilla. There is no evidence of a T cell lymphoproliferative disorder   LYMPH NODE BIOPSY Left 08/01/2020   Procedure: LYMPH NODE BIOPSY;  Surgeon: Robert Bellow, MD;  Location: [facility] ORS;  Service: General;  Laterality: Left;   PORTACATH PLACEMENT Left 08/22/2020   Procedure: INSERTION PORT-A-CATH;  Surgeon: Robert Bellow, MD;  Location: [facility] ORS;  Service: General;  Laterality: Left;    Social History   Socioeconomic History   Marital status: Married    Spouse name: Not on file   Number of children: Not on file    Years of education: Not on file   Highest education level: Not on file  Occupational History   Not on file  Tobacco Use   Smoking status: Never   Smokeless tobacco: Never  Vaping Use   Vaping Use: Never used  Substance and Sexual Activity   Alcohol use: No   Drug use: No   Sexual activity: Not on file  Other Topics Concern   Not on file  Social History Narrative   Not on file   Social Determinants of Health   Financial Resource Strain: Not on file  Food Insecurity: Not on file  Transportation Needs: Not on file  Physical Activity: Not on file  Stress: Not on file  Social Connections: Not on file  Intimate Partner Violence: Not on file    Family History  Problem Relation Age of Onset   Prostate cancer Father    Breast cancer Sister 61   Colon cancer Sister    Kidney cancer Sister    Breast cancer Sister      Current Outpatient Medications:    acetaminophen (TYLENOL) 500 MG tablet, Take 500 mg by mouth every 6 (six) hours as needed for moderate pain, fever or headache., Disp: , Rfl:    carvedilol (COREG) 25 MG tablet, TAKE 1 TABLET BY MOUTH TWICE A DAY, Disp: 180 tablet, Rfl: 3   cyanocobalamin 1000 MCG tablet, Take 1,000 mcg by mouth daily., Disp: , Rfl:    feeding supplement (ENSURE ENLIVE / ENSURE PLUS) LIQD, Take 237 mLs by mouth 2 (two) times daily between meals., Disp: 237 mL, Rfl: 12   levothyroxine (SYNTHROID)  50 MCG tablet, Take 50 mcg by mouth daily before breakfast., Disp: , Rfl:    loratadine (CLARITIN) 10 MG tablet, Take 10 mg by mouth daily as needed for allergies., Disp: , Rfl:    Melatonin 10 MG TABS, Take by mouth. Pt states she takes it every other day., Disp: , Rfl:    Multiple Vitamin (MULTIVITAMIN WITH MINERALS) TABS tablet, Take 1 tablet by mouth daily., Disp: , Rfl:    pravastatin (PRAVACHOL) 40 MG tablet, Take 40 mg by mouth daily., Disp: , Rfl:    spironolactone (ALDACTONE) 50 MG tablet, Take 0.5 tablet (25 mg) by mouth once daily , Disp: , Rfl:     folic acid (FOLVITE) 1 MG tablet, Take 1 tablet (1 mg total) by mouth daily. (Patient not taking: Reported on 10/12/2021), Disp: 90 tablet, Rfl: 1   lidocaine-prilocaine (EMLA) cream, Apply 1 Application topically as needed. Apply small amount to port site at least 1 hour prior to it being accessed, cover with plastic wrap, Disp: 30 g, Rfl: 2 No current facility-administered medications for this visit.  Facility-Administered Medications Ordered in Other Visits:    sodium chloride flush (NS) 0.9 % injection 10 mL, 10 mL, Intracatheter, PRN, Sindy Guadeloupe, MD, 10 mL at 12/10/20 0830  Physical exam:  Vitals:   02/12/22 1013  BP: (!) 119/46  Pulse: 79  Resp: 18  Temp: (!) 96.9 F (36.1 C)  SpO2: 97%  Weight: 182 lb 14.4 oz (83 kg)   Physical Exam Constitutional:      General: She is not in acute distress. Cardiovascular:     Rate and Rhythm: Normal rate and regular rhythm.     Heart sounds: Normal heart sounds.  Pulmonary:     Effort: Pulmonary effort is normal.     Breath sounds: Normal breath sounds.  Lymphadenopathy:     Comments: No palpable cervical, supraclavicular, axillary or inguinal adenopathy    Skin:    General: Skin is warm and dry.  Neurological:     Mental Status: She is alert and oriented to person, place, and time.         Latest Ref Rng & Units 02/12/2022    9:58 AM  CMP  Glucose 70 - 99 mg/dL 108   BUN 8 - 23 mg/dL 7   Creatinine 0.44 - 1.00 mg/dL 0.82   Sodium 135 - 145 mmol/L 139   Potassium 3.5 - 5.1 mmol/L 3.7   Chloride 98 - 111 mmol/L 106   CO2 22 - 32 mmol/L 28   Calcium 8.9 - 10.3 mg/dL 9.2   Total Protein 6.5 - 8.1 g/dL 7.2   Total Bilirubin 0.3 - 1.2 mg/dL 0.6   Alkaline Phos 38 - 126 U/L 86   AST 15 - 41 U/L 33   ALT 0 - 44 U/L 22       Latest Ref Rng & Units 02/12/2022    9:58 AM  CBC  WBC 4.0 - 10.5 K/uL 4.6   Hemoglobin 12.0 - 15.0 g/dL 13.7   Hematocrit 36.0 - 46.0 % 39.5   Platelets 150 - 400 K/uL 73     No images are  attached to the encounter.  CT CHEST ABDOMEN PELVIS W CONTRAST  Result Date: 02/09/2022 CLINICAL DATA:  Restaging follicular lymphoma. * Tracking Code: BO * EXAM: CT CHEST, ABDOMEN, AND PELVIS WITH CONTRAST TECHNIQUE: Multidetector CT imaging of the chest, abdomen and pelvis was performed following the standard protocol during bolus administration of intravenous contrast.  RADIATION DOSE REDUCTION: This exam was performed according to the departmental dose-optimization program which includes automated exposure control, adjustment of the mA and/or kV according to patient size and/or use of iterative reconstruction technique. CONTRAST:  127m OMNIPAQUE IOHEXOL 300 MG/ML  SOLN COMPARISON:  CT C AP 03/26/2021; CT abdomen pelvis Oct 06, 2018 FINDINGS: CT CHEST FINDINGS Cardiovascular: Left anterior chest wall Port-A-Cath is present with tip terminating in the superior vena cava. Thoracic aortic vascular calcifications. Trace fluid superior pericardial recess. Mediastinum/Nodes: No enlarged axillary, mediastinal or hilar lymphadenopathy. Lungs/Pleura: Central airways are patent. Dependent atelectasis within the bilateral lower lobes. No large area pulmonary consolidation. No pleural effusion or pneumothorax. Musculoskeletal: Thoracic spine degenerative changes. No aggressive or acute appearing osseous lesions. CT ABDOMEN PELVIS FINDINGS Hepatobiliary: Normal hepatic contour. Gallbladder is unremarkable. Stable mild intrahepatic biliary ductal dilatation. Common bile duct remains prominent measuring 8 mm. Pancreas: Unremarkable. No pancreatic ductal dilatation or surrounding inflammatory changes. Spleen: Normal in size without focal abnormality. Adrenals/Urinary Tract: Adrenal glands are normal. Kidneys enhance symmetrically with contrast. Punctate stones within the left kidney. Urinary bladder is unremarkable. Stomach/Bowel: Oral contrast material within the small and large bowel. Normal morphology of the stomach. No  evidence for bowel obstruction. No free fluid or free intraperitoneal air. Vascular/Lymphatic: Similar 12 x 6 mm pre aortic lymph node (image 67; series 2). Similar 6 mm aortocaval node (image 74; series 2). Similar matted appearing retroperitoneal nodes within the upper abdomen. No pelvic adenopathy identified. Reproductive: Hysterectomy.  No pelvic masses. Other: None. Musculoskeletal: No aggressive or acute appearing osseous lesions. Lumbar spine degenerative changes. IMPRESSION: 1. No significant interval change in post treatment appearance of the retroperitoneal lymph nodes. No evidence for new or progressive disease in the abdomen or pelvis. 2. Nonobstructive left nephrolithiasis. Electronically Signed   By: DLovey NewcomerM.D.   On: 02/09/2022 21:38     Assessment and plan- Patient is a 76y.o. female with stage IV follicular lymphoma s/p 6 cycles of R-CHOP currently in CR 1.  This is a routine follow-up visit  Clinically patient is doing well with no signs and symptoms of recurrenceIs on today's exam.  I have reviewed CT images independently and discussed findings with the patient.  CT scans also did not show any evidence of lymphoma recurrence.  I will see her back in 3 months with labs.  Scans to be done every 6 months.  She still has a port which will be flushed every 6 to 12 weeks.   Visit Diagnosis 1. Encounter for follow-up surveillance of lymphoma      Dr. ARanda Evens MD, MPH [facility] [id number]/22/2023 12:49 PM

## 2022-02-17 ENCOUNTER — Encounter: Payer: Self-pay | Admitting: Internal Medicine

## 2022-02-17 ENCOUNTER — Ambulatory Visit: Payer: Medicare HMO | Attending: Internal Medicine | Admitting: Internal Medicine

## 2022-02-17 VITALS — BP 134/76 | HR 70 | Ht 64.0 in | Wt 183.0 lb

## 2022-02-17 DIAGNOSIS — R000 Tachycardia, unspecified: Secondary | ICD-10-CM

## 2022-02-17 DIAGNOSIS — I10 Essential (primary) hypertension: Secondary | ICD-10-CM | POA: Diagnosis not present

## 2022-02-17 DIAGNOSIS — R002 Palpitations: Secondary | ICD-10-CM | POA: Diagnosis not present

## 2022-02-17 DIAGNOSIS — E785 Hyperlipidemia, unspecified: Secondary | ICD-10-CM

## 2022-02-17 DIAGNOSIS — I700 Atherosclerosis of aorta: Secondary | ICD-10-CM

## 2022-02-17 NOTE — Progress Notes (Signed)
Follow-up Outpatient Visit Date: 02/17/2022  Primary Care Provider: [name], [place] [id number]  Chief Complaint: Follow-up tachycardia and hypertension  HPI:  Natasha Farrell is a 76 y.o. female with history of sinus tachycardia, hypertension, hyperlipidemia, follicular lymphoma, and thyroid disease status post right thyroidectomy (2012), who presents for follow-up of tachycardia and hypertension.  I last saw her in March, at which time she complained of persistent fatigue that improved with regular exercise.  She also reported occasional orthostatic lightheadedness.  She last saw Dr. Janese Banks for monitoring of her lymphoma last week.  There was no evidence of recurrent lymphoma.  Today, Natasha Farrell reports that she is feeling well.  She denies palpitations as well as chest pain, shortness of breath, lightheadedness, and edema.  She is frustrated by her inability to lose weight despite walking 3 miles at least 4 days a week.  She admits to some dietary indiscretion, often eating sweets.  --------------------------------------------------------------------------------------------------  Past Medical History:  Diagnosis Date   Follicular lymphoma ([place]) 2022   Hx of hysterectomy    Hyperlipidemia    Hypertension    Thyroid disease    rt thryroidectomy  09/25/2010   Past Surgical History:  Procedure Laterality Date   BREAST BIOPSY Right 04/21/2020   hydromark 3 [place] bx FRAGMENTS OF LYMPH NODE WITH REACTIVE FOLLICULARHYPERPLASIA of the RIGHT axilla. There is no evidence of a T cell lymphoproliferative disorder   LYMPH NODE BIOPSY Left 08/01/2020   Procedure: LYMPH NODE BIOPSY;  Surgeon: Robert Bellow, MD;  Location: [facility] ORS;  Service: General;  Laterality: Left;   PORTACATH PLACEMENT Left 08/22/2020   Procedure: INSERTION PORT-A-CATH;  Surgeon: Robert Bellow, MD;  Location: [facility] ORS;  Service: General;  Laterality: Left;      Current Meds  Medication Sig   acetaminophen (TYLENOL) 500 MG tablet Take 500 mg by mouth every 6 (six) hours as needed for moderate pain, fever or headache.   carvedilol (COREG) 25 MG tablet TAKE 1 TABLET BY MOUTH TWICE A DAY   cyanocobalamin 1000 MCG tablet Take 1,000 mcg by mouth daily.   levothyroxine (SYNTHROID) 50 MCG tablet Take 50 mcg by mouth daily before breakfast.   lidocaine-prilocaine (EMLA) cream Apply 1 Application topically as needed. Apply small amount to port site at least 1 hour prior to it being accessed, cover with plastic wrap   loratadine (CLARITIN) 10 MG tablet Take 10 mg by mouth daily as needed for allergies.   Multiple Vitamin (MULTIVITAMIN WITH MINERALS) TABS tablet Take 1 tablet by mouth daily.   pravastatin (PRAVACHOL) 40 MG tablet Take 40 mg by mouth daily.   spironolactone (ALDACTONE) 50 MG tablet Take 0.5 tablet (25 mg) by mouth once daily     Allergies: Lisinopril  Social History   Tobacco Use   Smoking status: Never   Smokeless tobacco: Never  Vaping Use   Vaping Use: Never used  Substance Use Topics   Alcohol use: No   Drug use: No    Family History  Problem Relation Age of Onset   Prostate cancer Father    Breast cancer Sister 85   Colon cancer Sister    Kidney cancer Sister    Breast cancer Sister     Review of Systems: A 12-system review of systems was performed and was negative except as noted in the HPI.  --------------------------------------------------------------------------------------------------  Physical Exam: BP 134/76 (BP Location: Left Arm, Patient Position: Sitting, Cuff Size: Large)  Pulse 70   Ht '5\' 4"'$  (1.626 m)   Wt 183 lb (83 kg)   SpO2 94%   BMI 31.41 kg/m   General:  NAD. Neck: No JVD or HJR. Lungs: Clear to auscultation bilaterally without wheezes or crackles. Heart: Regular rate and rhythm without murmurs, rubs, or gallops. Abdomen: Soft, nontender, nondistended. Extremities: No lower  extremity edema.  EKG: Normal sinus rhythm without significant abnormality or change from prior tracing on 08/12/2021.  Lab Results  Component Value Date   WBC 4.6 02/12/2022   HGB 13.7 02/12/2022   HCT 39.5 02/12/2022   MCV 88.6 02/12/2022   PLT 73 (L) 02/12/2022    Lab Results  Component Value Date   NA 139 02/12/2022   K 3.7 02/12/2022   CL 106 02/12/2022   CO2 28 02/12/2022   BUN 7 (L) 02/12/2022   CREATININE 0.82 02/12/2022   GLUCOSE 108 (H) 02/12/2022   ALT 22 02/12/2022   Lipid panel (02/15/2022): Total cholesterol 182, triglycerides 106, HDL 47, LDL 114  --------------------------------------------------------------------------------------------------  ASSESSMENT AND PLAN: Palpitations/tachycardia: No symptoms reported.  EKG today shows normal sinus rhythm with a heart rate of 70 bpm.  Continue carvedilol 25 mg twice daily.  Hypertension: Blood pressure borderline elevated today.  We will not make any medication changes at this time.  Lifestyle modifications including continued exercise and dietary changes were encouraged.  Hyperlipidemia and aortic atherosclerosis: Most recent lipid panel 2 days ago through her PCPs office is notable for an LDL of 114.  Aortic atherosclerotic calcifications again noted on CT chest, abdomen, and pelvis done earlier this month.  We will continue pravastatin 40 mg daily and work on lifestyle modifications to help target an LDL less than 100.  We will we will check a fasting lipid panel in about 6 months to assess her progress.  Follow-up: Return to clinic in 1 year.  Nelva Bush, MD 02/17/2022 10:22 AM

## 2022-02-17 NOTE — Patient Instructions (Signed)
Medication Instructions:  - Your physician recommends that you continue on your current medications as directed. Please refer to the Current Medication list given to you today.  *If you need a refill on your cardiac medications before your next appointment, please call your pharmacy*   Lab Work: - Your physician recommends that you return for lab work in: 6 months (around 08/17/21)  Lipid panel  - Please don't eat/ drink anything for 8 hours prior to your lab draw, except for water/ black coffee   [facility] Mall Entrance at [facility] 1st desk on the right to check in (REGISTRATION)  Lab hours: Monday- Friday (7:30 am- 5:30 pm)   If you have labs (blood work) drawn today and your tests are completely normal, you will receive your results only by: MyChart Message (if you have MyChart) OR A paper copy in the mail If you have any lab test that is abnormal or we need to change your treatment, we will call you to review the results.   Testing/Procedures: - none ordered   Follow-Up: At [facility], you and your health needs are our priority.  As part of our continuing mission to provide you with exceptional heart care, we have created designated Provider Care Teams.  These Care Teams include your primary Cardiologist (physician) and Advanced Practice Providers (APPs -  Physician Assistants and Nurse Practitioners) who all work together to provide you with the care you need, when you need it.  We recommend signing up for the patient portal called "MyChart".  Sign up information is provided on this After Visit Summary.  MyChart is used to connect with patients for Virtual Visits (Telemedicine).  Patients are able to view lab/test results, encounter notes, upcoming appointments, etc.  Non-urgent messages can be sent to your provider as well.   To learn more about what you can do with MyChart, go to [email or web address].    Your next appointment:   1 year(s)  The format for your next  appointment:   In Person  Provider:   You may see Nelva Bush, MD or one of the following Advanced Practice Providers on your designated Care Team:   Murray Hodgkins, NP Christell Faith, PA-C Cadence Kathlen Mody, PA-C Gerrie Nordmann, NP    Other Instructions N/a  Important Information About Sugar

## 2022-02-22 ENCOUNTER — Other Ambulatory Visit: Payer: Self-pay | Admitting: Physician Assistant

## 2022-02-22 DIAGNOSIS — Z1231 Encounter for screening mammogram for malignant neoplasm of breast: Secondary | ICD-10-CM

## 2022-03-29 ENCOUNTER — Inpatient Hospital Stay: Payer: Medicare HMO

## 2022-03-31 ENCOUNTER — Ambulatory Visit
Admission: RE | Admit: 2022-03-31 | Discharge: 2022-03-31 | Disposition: A | Discharge status: Home / Self Care | Payer: Medicare HMO | Source: Ambulatory Visit | Attending: Physician Assistant | Admitting: Physician Assistant

## 2022-03-31 DIAGNOSIS — Z1231 Encounter for screening mammogram for malignant neoplasm of breast: Secondary | ICD-10-CM | POA: Insufficient documentation

## 2022-05-12 ENCOUNTER — Encounter: Payer: Self-pay | Admitting: Ophthalmology

## 2022-05-20 NOTE — Discharge Instructions (Signed)

## 2022-05-21 ENCOUNTER — Encounter: Payer: Self-pay | Admitting: Oncology

## 2022-05-21 ENCOUNTER — Inpatient Hospital Stay: Payer: Medicare HMO | Attending: Oncology

## 2022-05-21 ENCOUNTER — Inpatient Hospital Stay (HOSPITAL_BASED_OUTPATIENT_CLINIC_OR_DEPARTMENT_OTHER): Payer: Medicare HMO | Admitting: Oncology

## 2022-05-21 VITALS — BP 145/57 | HR 67 | Temp 96.3°F | Resp 14 | Ht 64.0 in | Wt 181.8 lb

## 2022-05-21 DIAGNOSIS — Z95828 Presence of other vascular implants and grafts: Secondary | ICD-10-CM

## 2022-05-21 DIAGNOSIS — Z08 Encounter for follow-up examination after completed treatment for malignant neoplasm: Secondary | ICD-10-CM

## 2022-05-21 DIAGNOSIS — C8280 Other types of follicular lymphoma, unspecified site: Secondary | ICD-10-CM | POA: Insufficient documentation

## 2022-05-21 DIAGNOSIS — D696 Thrombocytopenia, unspecified: Secondary | ICD-10-CM | POA: Diagnosis not present

## 2022-05-21 DIAGNOSIS — C8219 Follicular lymphoma grade II, extranodal and solid organ sites: Secondary | ICD-10-CM | POA: Diagnosis not present

## 2022-05-21 LAB — CBC WITH DIFFERENTIAL/PLATELET
Abs Immature Granulocytes: 0.03 10*3/uL (ref 0.00–0.07)
Basophils Absolute: 0 10*3/uL (ref 0.0–0.1)
Basophils Relative: 1 %
Eosinophils Absolute: 0.1 10*3/uL (ref 0.0–0.5)
Eosinophils Relative: 1 %
HCT: 40.9 % (ref 36.0–46.0)
Hemoglobin: 13.8 g/dL (ref 12.0–15.0)
Immature Granulocytes: 1 %
Lymphocytes Relative: 28 %
Lymphs Abs: 1.7 10*3/uL (ref 0.7–4.0)
MCH: 30.2 pg (ref 26.0–34.0)
MCHC: 33.7 g/dL (ref 30.0–36.0)
MCV: 89.5 fL (ref 80.0–100.0)
Monocytes Absolute: 1.1 10*3/uL — ABNORMAL HIGH (ref 0.1–1.0)
Monocytes Relative: 17 %
Neutro Abs: 3.4 10*3/uL (ref 1.7–7.7)
Neutrophils Relative %: 52 %
Platelets: 73 10*3/uL — ABNORMAL LOW (ref 150–400)
RBC: 4.57 MIL/uL (ref 3.87–5.11)
RDW: 13.5 % (ref 11.5–15.5)
Smear Review: NORMAL
WBC: 6.3 10*3/uL (ref 4.0–10.5)
nRBC: 0 % (ref 0.0–0.2)

## 2022-05-21 LAB — COMPREHENSIVE METABOLIC PANEL
ALT: 23 U/L (ref 0–44)
AST: 30 U/L (ref 15–41)
Albumin: 4.8 g/dL (ref 3.5–5.0)
Alkaline Phosphatase: 82 U/L (ref 38–126)
Anion gap: 9 (ref 5–15)
BUN: 10 mg/dL (ref 8–23)
CO2: 28 mmol/L (ref 22–32)
Calcium: 9.9 mg/dL (ref 8.9–10.3)
Chloride: 102 mmol/L (ref 98–111)
Creatinine, Ser: 0.8 mg/dL (ref 0.44–1.00)
GFR, Estimated: >60 mL/min (ref >60)
Glucose, Bld: 105 mg/dL — ABNORMAL HIGH (ref 70–99)
Potassium: 3.9 mmol/L (ref 3.5–5.1)
Sodium: 139 mmol/L (ref 135–145)
Total Bilirubin: 0.7 mg/dL (ref 0.3–1.2)
Total Protein: 7.5 g/dL (ref 6.5–8.1)

## 2022-05-21 MED ORDER — HEPARIN SOD (PORK) LOCK FLUSH 100 UNIT/ML IV SOLN
500.0000 [IU] | Freq: Once | INTRAVENOUS | Status: AC
  Administered 2022-05-21: 500 [IU] via INTRAVENOUS
  Filled 2022-05-21: qty 5

## 2022-05-21 MED ORDER — SODIUM CHLORIDE 0.9% FLUSH
10.0000 mL | Freq: Once | INTRAVENOUS | Status: AC
  Administered 2022-05-21: 10 mL via INTRAVENOUS
  Filled 2022-05-21: qty 10

## 2022-05-21 NOTE — Progress Notes (Signed)
Hematology/Oncology Consult note [facility]  Telephone:([phone] [phone]  Patient Care Team: [name], MD as PCP - General (Physician Assistant) Bary Castilla Forest Gleason, MD as Consulting Physician (General Surgery) End, Harrell Gave, MD as Consulting Physician (Cardiology) Sindy Guadeloupe, MD as Consulting Physician (Hematology and Oncology)   Name of the patient: Natasha Farrell  [id number]  29-Sep-1945   Date of visit: 05/21/22  Diagnosis- stage IV follicular lymphoma currently in remission   Chief complaint/ Reason for visit- r routine follow-up of follicular lymphoma  Heme/Onc history: Patient is a 76 year old female now transfer of care from Dr. Mike Gip.  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  She had presented with extensive adenopathy and massive splenomegaly.   Right axillary ultrasound guided biopsy on 04/21/2020 revealed fragments of lymph node with reactive follicular hyperplasia. There was no definite evidence of malignancy. Flow cytometry revealed a CD10+ B cell population. Clonality could not be evaluated due to non-specific light chain binding. There was no evidence of a T cell lymphoproliferative disorder.      PET scan on 08/12/2020 revealed widespread adenopathy, visceral involvement and signs of near diffuse, multifocal bony involvement, distribution most c/w lymphoma, Deauville category 5. There was ileal involvement as well. Peritoneal involvement was suggested as well with nodular changes about the peritoneum but without significant FDG uptake. There was paraspinal soft tissue without frank bony destruction. Scattered areas of sclerosis underestimated the degree of bony involvement. Abdominal ascites may be related to anasarca and or lymphatic congestion with marked enlargement of LEFT-sided pleural effusion and mild enlargement of small RIGHT-sided pleural fluid.  Left-sided pleural effusion  has been attributed to follicular lymphoma and she has undergone multiple thoracentesis in the past with the last one that was done in April 2022.     Bone marrow on 08/04/2020 revealed variably cellular bone marrow with trilineage hematopoiesis and involvement by an atypical lymphoid proliferation  The bone marrow core biopsy demonstrated involvement by an atypical lymphoid proliferation with paratrabecular lymphoid aggregates and  patchy infiltration of the bone marrow space with accompanying fibrosis. The infiltrate was composed of mixed B-cells and T-cells. At least a subset of the B-cells co-expressed CD10 and BCL6 without definitive BCL2 expression. Scattered larger CD20-positive larger B-cells were also present. Flow cytometry showed no definitive atypical B-cell population. No immunophenotypically aberrant T-cell population was identified. There were no increase in blasts. Monocytes showed heterogeneous CD56 expression.  Cytogenetics were normal (20, XX).   Patient was seen by me for the first time prior to cycle 5 of R-CHOP chemotherapy and did not get any interim scans after 3 cycles.  Plan is therefore to get a PET scan after 6 cycles of R-CHOP chemotherapy   Scans after 6 cycles of R-CHOP chemotherapy showed decrease in the size of adenopathy both above and below the diaphragm with significant reduction/resolution in the abnormal hypermetabolic activity.  There was persistent low-level hypermetabolic activity noted in the abdominal and pelvic lymph nodes with an SUV between 1-3 but minimally about the background mediastinal blood pool activity.  Deauville 2/3   Patient also went for second opinion to [facility] and overall decision was made against maintenance Rituxan.    Interval history-patient is doing well overall.  Denies any specific complaints at this time.  She has not had any recurrent infections.  Appetite and weight have remained stable.  Denies any fatigue or drenching night  sweats.  ECOG PS- 0 Pain scale- 0  Review of systems- Review of Systems  Constitutional:  Negative for chills, fever, malaise/fatigue and weight loss.  HENT:  Negative for congestion, ear discharge and nosebleeds.   Eyes:  Negative for blurred vision.  Respiratory:  Negative for cough, hemoptysis, sputum production, shortness of breath and wheezing.   Cardiovascular:  Negative for chest pain, palpitations, orthopnea and claudication.  Gastrointestinal:  Negative for abdominal pain, blood in stool, constipation, diarrhea, heartburn, melena, nausea and vomiting.  Genitourinary:  Negative for dysuria, flank pain, frequency, hematuria and urgency.  Musculoskeletal:  Negative for back pain, joint pain and myalgias.  Skin:  Negative for rash.  Neurological:  Negative for dizziness, tingling, focal weakness, seizures, weakness and headaches.  Endo/Heme/Allergies:  Does not bruise/bleed easily.  Psychiatric/Behavioral:  Negative for depression and suicidal ideas. The patient does not have insomnia.       Allergies  Allergen Reactions   Lisinopril Swelling    angioedema     Past Medical History:  Diagnosis Date   Arthritis    Follicular lymphoma ([place]) 2022   Hx of hysterectomy    Hyperlipidemia    Hypertension    Thyroid disease    rt thryroidectomy  09/25/2010   Wears dentures    partial upper and lower     Past Surgical History:  Procedure Laterality Date   ABDOMINAL HYSTERECTOMY     BREAST BIOPSY Right 04/21/2020   hydromark 3 [place] bx FRAGMENTS OF LYMPH NODE WITH REACTIVE FOLLICULARHYPERPLASIA of the RIGHT axilla. There is no evidence of a T cell lymphoproliferative disorder   LYMPH NODE BIOPSY Left 08/01/2020   Procedure: LYMPH NODE BIOPSY;  Surgeon: Robert Bellow, MD;  Location: [facility] ORS;  Service: General;  Laterality: Left;   PORTACATH PLACEMENT Left 08/22/2020   Procedure: INSERTION PORT-A-CATH;  Surgeon: Robert Bellow, MD;  Location: [facility] ORS;  Service:  General;  Laterality: Left;    Social History   Socioeconomic History   Marital status: Married    Spouse name: Not on file   Number of children: Not on file   Years of education: Not on file   Highest education level: Not on file  Occupational History   Not on file  Tobacco Use   Smoking status: Never   Smokeless tobacco: Never  Vaping Use   Vaping Use: Never used  Substance and Sexual Activity   Alcohol use: No   Drug use: No   Sexual activity: Not on file  Other Topics Concern   Not on file  Social History Narrative   Not on file   Social Determinants of Health   Financial Resource Strain: Not on file  Food Insecurity: Not on file  Transportation Needs: Not on file  Physical Activity: Not on file  Stress: Not on file  Social Connections: Not on file  Intimate Partner Violence: Not on file    Family History  Problem Relation Age of Onset   Prostate cancer Father    Breast cancer Sister 46   Colon cancer Sister    Kidney cancer Sister    Breast cancer Sister      Current Outpatient Medications:    acetaminophen (TYLENOL) 500 MG tablet, Take 500 mg by mouth every 6 (six) hours as needed for moderate pain, fever or headache., Disp: , Rfl:    carvedilol (COREG) 25 MG tablet, TAKE 1 TABLET BY MOUTH TWICE A DAY, Disp: 180 tablet, Rfl: 3   Cholecalciferol (VITAMIN D3) 50 MCG (2000 UT) TABS, Take by mouth daily.,  Disp: , Rfl:    COLLAGEN PO, Take by mouth daily., Disp: , Rfl:    cyanocobalamin 1000 MCG tablet, Take 1,000 mcg by mouth daily., Disp: , Rfl:    levothyroxine (SYNTHROID) 50 MCG tablet, Take 50 mcg by mouth daily before breakfast., Disp: , Rfl:    lidocaine-prilocaine (EMLA) cream, Apply 1 Application topically as needed. Apply small amount to port site at least 1 hour prior to it being accessed, cover with plastic wrap, Disp: 30 g, Rfl: 2   loratadine (CLARITIN) 10 MG tablet, Take 10 mg by mouth daily as needed for allergies., Disp: , Rfl:    Multiple  Vitamin (MULTIVITAMIN WITH MINERALS) TABS tablet, Take 1 tablet by mouth daily., Disp: , Rfl:    pravastatin (PRAVACHOL) 40 MG tablet, Take 40 mg by mouth daily., Disp: , Rfl:    spironolactone (ALDACTONE) 50 MG tablet, Take 0.5 tablet (25 mg) by mouth once daily , Disp: , Rfl:  No current facility-administered medications for this visit.  Facility-Administered Medications Ordered in Other Visits:    sodium chloride flush (NS) 0.9 % injection 10 mL, 10 mL, Intracatheter, PRN, Sindy Guadeloupe, MD, 10 mL at 12/10/20 0830  Physical exam:  Vitals:   05/21/22 0944  BP: (!) 145/57  Pulse: 67  Resp: 14  Temp: (!) 96.3 F (35.7 C)  Weight: 181 lb 12.8 oz (82.5 kg)  Height: '5\' 4"'$  (1.626 m)   Physical Exam Constitutional:      General: She is not in acute distress. Cardiovascular:     Rate and Rhythm: Normal rate and regular rhythm.     Heart sounds: Normal heart sounds.  Pulmonary:     Effort: Pulmonary effort is normal.     Breath sounds: Normal breath sounds.  Abdominal:     General: Bowel sounds are normal.     Palpations: Abdomen is soft.  Lymphadenopathy:     Comments: No palpable cervical, supraclavicular, axillary or inguinal adenopathy    Skin:    General: Skin is warm and dry.  Neurological:     Mental Status: She is alert and oriented to person, place, and time.         Latest Ref Rng & Units 05/21/2022    9:25 AM  CMP  Glucose 70 - 99 mg/dL 105   BUN 8 - 23 mg/dL 10   Creatinine 0.44 - 1.00 mg/dL 0.80   Sodium 135 - 145 mmol/L 139   Potassium 3.5 - 5.1 mmol/L 3.9   Chloride 98 - 111 mmol/L 102   CO2 22 - 32 mmol/L 28   Calcium 8.9 - 10.3 mg/dL 9.9   Total Protein 6.5 - 8.1 g/dL 7.5   Total Bilirubin 0.3 - 1.2 mg/dL 0.7   Alkaline Phos 38 - 126 U/L 82   AST 15 - 41 U/L 30   ALT 0 - 44 U/L 23       Latest Ref Rng & Units 05/21/2022    9:25 AM  CBC  WBC 4.0 - 10.5 K/uL 6.3   Hemoglobin 12.0 - 15.0 g/dL 13.8   Hematocrit 36.0 - 46.0 % 40.9   Platelets  150 - 400 K/uL 73      Assessment and plan- Patient is a 76 y.o. female with stage IV follicular lymphoma s/p 6 cycles of R-CHOP currently in CR1.  This is a routine follow-up visit for follicular lymphoma  Clinically patient is doing well with no concerning signs and symptoms of recurrence on today's exam.  I will see her back in 3 months with scans.  She continues to get port flushes every 3 months.  Plan to get her port out after her next CT scan.  Thrombocytopenia: Likely secondary to prior chemotherapy and Rituxan use.  Continue to monitor overall stable   Visit Diagnosis 1. Follicular lymphoma grade II of extranodal and solid organ sites ([place])   2. Thrombocytopenia ([place])      Dr. Randa Evens, MD, MPH [facility] [phone] 05/21/2022 11:09 AM

## 2022-05-21 NOTE — Addendum Note (Signed)
Addended by: Luella Cook on: 05/21/2022 11:33 AM   Modules accepted: Orders

## 2022-05-25 ENCOUNTER — Ambulatory Visit: Payer: Medicare HMO | Admitting: Anesthesiology

## 2022-05-25 ENCOUNTER — Other Ambulatory Visit: Payer: Self-pay

## 2022-05-25 ENCOUNTER — Encounter: Payer: Self-pay | Admitting: Ophthalmology

## 2022-05-25 ENCOUNTER — Encounter
Admission: RE | Disposition: A | Discharge status: Home / Self Care | Payer: Self-pay | Source: Home / Self Care | Attending: Ophthalmology

## 2022-05-25 ENCOUNTER — Ambulatory Visit
Admission: RE | Admit: 2022-05-25 | Discharge: 2022-05-25 | Disposition: A | Discharge status: Home / Self Care | Payer: Medicare HMO | Attending: Ophthalmology | Admitting: Ophthalmology

## 2022-05-25 DIAGNOSIS — H2511 Age-related nuclear cataract, right eye: Secondary | ICD-10-CM | POA: Insufficient documentation

## 2022-05-25 DIAGNOSIS — I10 Essential (primary) hypertension: Secondary | ICD-10-CM | POA: Diagnosis not present

## 2022-05-25 DIAGNOSIS — E079 Disorder of thyroid, unspecified: Secondary | ICD-10-CM | POA: Insufficient documentation

## 2022-05-25 HISTORY — DX: Unspecified osteoarthritis, unspecified site: M19.90

## 2022-05-25 HISTORY — PX: CATARACT EXTRACTION W/PHACO: SHX586

## 2022-05-25 HISTORY — DX: Presence of dental prosthetic device (complete) (partial): Z97.2

## 2022-05-25 SURGERY — PHACOEMULSIFICATION, CATARACT, WITH IOL INSERTION
Anesthesia: Monitor Anesthesia Care | Site: Eye | Laterality: Right

## 2022-05-25 MED ORDER — ARMC OPHTHALMIC DILATING DROPS
1.0000 | OPHTHALMIC | Status: DC | PRN
  Administered 2022-05-25 (×3): 1 via OPHTHALMIC

## 2022-05-25 MED ORDER — TRYPAN BLUE 0.06 % IO SOSY
PREFILLED_SYRINGE | INTRAOCULAR | Status: DC | PRN
  Administered 2022-05-25: .5 mL via INTRAOCULAR

## 2022-05-25 MED ORDER — FENTANYL CITRATE (PF) 100 MCG/2ML IJ SOLN
INTRAMUSCULAR | Status: DC | PRN
  Administered 2022-05-25: 50 ug via INTRAVENOUS

## 2022-05-25 MED ORDER — LACTATED RINGERS IV SOLN: INTRAVENOUS | Status: DC

## 2022-05-25 MED ORDER — SIGHTPATH DOSE#1 NA CHONDROIT SULF-NA HYALURON 40-17 MG/ML IO SOLN
INTRAOCULAR | Status: DC | PRN
  Administered 2022-05-25: 1 mL via INTRAOCULAR

## 2022-05-25 MED ORDER — SIGHTPATH DOSE#1 BSS IO SOLN: INTRAOCULAR | Status: DC | PRN

## 2022-05-25 MED ORDER — MIDAZOLAM HCL 2 MG/2ML IJ SOLN
INTRAMUSCULAR | Status: DC | PRN
  Administered 2022-05-25: 1 mg via INTRAVENOUS

## 2022-05-25 MED ORDER — MOXIFLOXACIN HCL 0.5 % OP SOLN
OPHTHALMIC | Status: DC | PRN
  Administered 2022-05-25: .2 mL via OPHTHALMIC

## 2022-05-25 MED ORDER — TETRACAINE HCL 0.5 % OP SOLN
1.0000 [drp] | OPHTHALMIC | Status: DC | PRN
  Administered 2022-05-25 (×3): 1 [drp] via OPHTHALMIC

## 2022-05-25 MED ORDER — SIGHTPATH DOSE#1 BSS IO SOLN
INTRAOCULAR | Status: DC | PRN
  Administered 2022-05-25: 15 mL

## 2022-05-25 MED ORDER — BRIMONIDINE TARTRATE-TIMOLOL 0.2-0.5 % OP SOLN
OPHTHALMIC | Status: DC | PRN
  Administered 2022-05-25: 1 [drp] via OPHTHALMIC

## 2022-05-25 MED ORDER — SIGHTPATH DOSE#1 BSS IO SOLN
INTRAOCULAR | Status: DC | PRN
  Administered 2022-05-25: 63 mL via OPHTHALMIC

## 2022-05-25 SURGICAL SUPPLY — 15 items
CANNULA ANT/CHMB 27G (MISCELLANEOUS) IMPLANT
CANNULA ANT/CHMB 27GA (MISCELLANEOUS) ×1 IMPLANT
CATARACT SUITE SIGHTPATH (MISCELLANEOUS) ×1 IMPLANT
FEE CATARACT SUITE SIGHTPATH (MISCELLANEOUS) ×1 IMPLANT
GLOVE SURG ENC TEXT LTX SZ8 (GLOVE) ×1 IMPLANT
GLOVE SURG TRIUMPH 8.0 PF LTX (GLOVE) ×1 IMPLANT
LENS IOL TECNIS EYHANCE 19.0 (Intraocular Lens) IMPLANT
NDL FILTER BLUNT 18X1 1/2 (NEEDLE) ×1 IMPLANT
NEEDLE FILTER BLUNT 18X1 1/2 (NEEDLE) ×1 IMPLANT
PACK VIT ANT 23G (MISCELLANEOUS) IMPLANT
RING MALYGIN (MISCELLANEOUS) IMPLANT
SUT ETHILON 10-0 CS-B-6CS-B-6 (SUTURE)
SUTURE EHLN 10-0 CS-B-6CS-B-6 (SUTURE) IMPLANT
SYR 3ML LL SCALE MARK (SYRINGE) ×1 IMPLANT
WATER STERILE IRR 250ML POUR (IV SOLUTION) ×1 IMPLANT

## 2022-05-25 NOTE — Anesthesia Preprocedure Evaluation (Signed)
Anesthesia Evaluation  Patient identified by MRN, date of birth, ID band Patient awake    Reviewed: Allergy & Precautions, H&P , NPO status , Patient's Chart, lab work & pertinent test results  History of Anesthesia Complications Negative for: history of anesthetic complications  Airway Mallampati: III  TM Distance: >3 FB Neck ROM: limited    Dental  (+) Chipped, Poor Dentition, Missing, Implants, Dental Advidsory Given, Partial Upper, Partial Lower   Pulmonary neg pulmonary ROS, neg shortness of breath, neg COPD, neg recent URI   Pulmonary exam normal        Cardiovascular Exercise Tolerance: Good hypertension, (-) angina +CHF  (-) Past MI and (-) Cardiac Stents Normal cardiovascular exam+ dysrhythmias (-) Valvular Problems/Murmurs     Neuro/Psych negative neurological ROS  negative psych ROS   GI/Hepatic negative GI ROS, Neg liver ROS,,,  Endo/Other  diabetes (borderline)Hypothyroidism    Renal/GU negative Renal ROS  negative genitourinary   Musculoskeletal   Abdominal   Peds  Hematology negative hematology ROS (+)   Anesthesia Other Findings Past Medical History: No date: Hx of hysterectomy No date: Hyperlipidemia No date: Hypertension No date: Thyroid disease     Comment:  rt thryroidectomy  09/25/2010  Past Surgical History: 04/21/2020: BREAST BIOPSY; Right     Comment:  hydromark 3 [place] bx path pending 08/01/2020: LYMPH NODE BIOPSY; Left     Comment:  Procedure: LYMPH NODE BIOPSY;  Surgeon: Robert Bellow, MD;  Location: [facility] ORS;  Service: General;                Laterality: Left;     Reproductive/Obstetrics negative OB ROS                             Anesthesia Physical Anesthesia Plan  ASA: 2  Anesthesia Plan: MAC   Post-op Pain Management:    Induction: Intravenous  PONV Risk Score and Plan: 2 and Midazolam and Treatment may vary due to age or  medical condition  Airway Management Planned: Natural Airway and Nasal Cannula  Additional Equipment:   Intra-op Plan:   Post-operative Plan:   Informed Consent: I have reviewed the patients History and Physical, chart, labs and discussed the procedure including the risks, benefits and alternatives for the proposed anesthesia with the patient or authorized representative who has indicated his/her understanding and acceptance.     Dental Advisory Given  Plan Discussed with: Anesthesiologist, CRNA and Surgeon  Anesthesia Plan Comments: (Patient consented for risks of anesthesia including but not limited to:  - adverse reactions to medications - risk of airway placement if required - damage to eyes, teeth, lips or other oral mucosa - nerve damage due to positioning  - sore throat or hoarseness - Damage to heart, brain, nerves, lungs, other parts of body or loss of life  Patient voiced understanding.)        Anesthesia Quick Evaluation

## 2022-05-25 NOTE — Transfer of Care (Signed)
Immediate Anesthesia Transfer of Care Note  Patient: Natasha Farrell  Procedure(s) Performed: CATARACT EXTRACTION PHACO AND INTRAOCULAR LENS PLACEMENT (IOC) RIGHT (Right: Eye)  Patient Location: PACU  Anesthesia Type: MAC  Level of Consciousness: awake, alert  and patient cooperative  Airway and Oxygen Therapy: Patient Spontanous Breathing and Patient connected to supplemental oxygen  Post-op Assessment: Post-op Vital signs reviewed, Patient's Cardiovascular Status Stable, Respiratory Function Stable, Patent Airway and No signs of Nausea or vomiting  Post-op Vital Signs: Reviewed and stable  Complications: No notable events documented.

## 2022-05-25 NOTE — Op Note (Signed)
PREOPERATIVE DIAGNOSIS:  Nuclear sclerotic cataract of the right eye.   POSTOPERATIVE DIAGNOSIS:  CATARACT   OPERATIVE PROCEDURE:ORPROCALL@   SURGEON:  Birder Robson, MD.   ANESTHESIA:  Anesthesiologist: Martha Clan, MD CRNA: Moises Blood, CRNA  1.      Managed anesthesia care. 2.      0.44m of Shugarcaine was instilled in the eye following the paracentesis.   COMPLICATIONS: Vision Blue was used to stain the anterior capsule due to very poor/ no visualization of the red reflex.    TECHNIQUE:   Stop and chop   DESCRIPTION OF PROCEDURE:  The patient was examined and consented in the preoperative holding area where the aforementioned topical anesthesia was applied to the right eye and then brought back to the Operating Room where the right eye was prepped and draped in the usual sterile ophthalmic fashion and a lid speculum was placed. A paracentesis was created with the side port blade and the anterior chamber was filled with viscoelastic. A near clear corneal incision was performed with the steel keratome. A continuous curvilinear capsulorrhexis was performed with a cystotome followed by the capsulorrhexis forceps. Hydrodissection and hydrodelineation were carried out with BSS on a blunt cannula. The lens was removed in a stop and chop  technique and the remaining cortical material was removed with the irrigation-aspiration handpiece. The capsular bag was inflated with viscoelastic and the Technis [id number]  lens was placed in the capsular bag without complication. The remaining viscoelastic was removed from the eye with the irrigation-aspiration handpiece. The wounds were hydrated. The anterior chamber was flushed with BSS and the eye was inflated to physiologic pressure. 0.126mof Vigamox was placed in the anterior chamber. The wounds were found to be water tight. The eye was dressed with Combigan. The patient was given protective glasses to wear throughout the day and a shield with which to  sleep tonight. The patient was also given drops with which to begin a drop regimen today and will follow-up with me in one day. Implant Name Type Inv. Item Serial No. Manufacturer Lot No. LRB No. Used Action  LENS IOL TECNIS EYHANCE 19.0 - [id number] Lens LENS IOL TECNIS EYHANCE 19.0 [id number]  Right 1 Implanted   Procedure(s) with comments: CATARACT EXTRACTION PHACO AND INTRAOCULAR LENS PLACEMENT (IOC) RIGHT (Right) - 6.39 0:38.5  Electronically signed: WiBirder Robson/06/2022 11:45 AM

## 2022-05-25 NOTE — H&P (Signed)
[facility]   Primary Care Physician:  [name], MD Ophthalmologist: Dr. George Ina  Pre-Procedure History & Physical: HPI:  Natasha Farrell is a 77 y.o. female here for cataract surgery.   Past Medical History:  Diagnosis Date   Arthritis    Follicular lymphoma ([place]) 2022   Hx of hysterectomy    Hyperlipidemia    Hypertension    Thyroid disease    rt thryroidectomy  09/25/2010   Wears dentures    partial upper and lower    Past Surgical History:  Procedure Laterality Date   ABDOMINAL HYSTERECTOMY     BREAST BIOPSY Right 04/21/2020   hydromark 3 [place] bx FRAGMENTS OF LYMPH NODE WITH REACTIVE FOLLICULARHYPERPLASIA of the RIGHT axilla. There is no evidence of a T cell lymphoproliferative disorder   LYMPH NODE BIOPSY Left 08/01/2020   Procedure: LYMPH NODE BIOPSY;  Surgeon: Robert Bellow, MD;  Location: [facility] ORS;  Service: General;  Laterality: Left;   PORTACATH PLACEMENT Left 08/22/2020   Procedure: INSERTION PORT-A-CATH;  Surgeon: Robert Bellow, MD;  Location: [facility] ORS;  Service: General;  Laterality: Left;    Prior to Admission medications   Medication Sig Start Date End Date Taking? Authorizing Provider  acetaminophen (TYLENOL) 500 MG tablet Take 500 mg by mouth every 6 (six) hours as needed for moderate pain, fever or headache.   Yes [provider]  carvedilol (COREG) 25 MG tablet TAKE 1 TABLET BY MOUTH TWICE A DAY 10/14/21  Yes End, Harrell Gave, MD  Cholecalciferol (VITAMIN D3) 50 MCG (2000 UT) TABS Take by mouth daily.   Yes [provider]  COLLAGEN PO Take by mouth daily.   Yes [provider]  cyanocobalamin 1000 MCG tablet Take 1,000 mcg by mouth daily.   Yes [provider]  levothyroxine (SYNTHROID) 50 MCG tablet Take 50 mcg by mouth daily before breakfast.   Yes [provider]  loratadine (CLARITIN) 10 MG tablet Take 10 mg by mouth daily as needed for allergies.   Yes [provider]   Multiple Vitamin (MULTIVITAMIN WITH MINERALS) TABS tablet Take 1 tablet by mouth daily. 08/21/20  Yes Enzo Bi, MD  pravastatin (PRAVACHOL) 40 MG tablet Take 40 mg by mouth daily.   Yes [provider]  spironolactone (ALDACTONE) 50 MG tablet Take 0.5 tablet (25 mg) by mouth once daily  07/25/20  Yes [provider]  lidocaine-prilocaine (EMLA) cream Apply 1 Application topically as needed. Apply small amount to port site at least 1 hour prior to it being accessed, cover with plastic wrap 02/12/22   Sindy Guadeloupe, MD    Allergies as of 04/20/2022 - Review Complete 02/17/2022  Allergen Reaction Noted   Lisinopril Swelling 01/05/2018    Family History  Problem Relation Age of Onset   Prostate cancer Father    Breast cancer Sister 89   Colon cancer Sister    Kidney cancer Sister    Breast cancer Sister     Social History   Socioeconomic History   Marital status: Married    Spouse name: Not on file   Number of children: Not on file   Years of education: Not on file   Highest education level: Not on file  Occupational History   Not on file  Tobacco Use   Smoking status: Never   Smokeless tobacco: Never  Vaping Use   Vaping Use: Never used  Substance and Sexual Activity   Alcohol use: No   Drug use:  No   Sexual activity: Not on file  Other Topics Concern   Not on file  Social History Narrative   Not on file   Social Determinants of Health   Financial Resource Strain: Not on file  Food Insecurity: Not on file  Transportation Needs: Not on file  Physical Activity: Not on file  Stress: Not on file  Social Connections: Not on file  Intimate Partner Violence: Not on file    Review of Systems: See HPI, otherwise negative ROS  Physical Exam: BP (!) 168/99   Pulse 76   Temp 98 F (36.7 C) (Temporal)   Resp 18   Ht '5\' 4"'$  (1.626 m)   Wt 82.1 kg   SpO2 99%   BMI 31.07 kg/m  General:   Alert, cooperative in NAD Head:  Normocephalic and  atraumatic. Respiratory:  Normal work of breathing. Cardiovascular:  RRR  Impression/Plan: Natasha Farrell is here for cataract surgery.  Risks, benefits, limitations, and alternatives regarding cataract surgery have been reviewed with the patient.  Questions have been answered.  All parties agreeable.   Birder Robson, MD  05/25/2022, 11:17 AM

## 2022-05-25 NOTE — Anesthesia Postprocedure Evaluation (Signed)
Anesthesia Post Note  Patient: Natasha Farrell  Procedure(s) Performed: CATARACT EXTRACTION PHACO AND INTRAOCULAR LENS PLACEMENT (IOC) RIGHT (Right: Eye)  Patient location during evaluation: PACU Anesthesia Type: MAC Level of consciousness: awake and alert Pain management: pain level controlled Vital Signs Assessment: post-procedure vital signs reviewed and stable Respiratory status: spontaneous breathing, nonlabored ventilation, respiratory function stable and patient connected to nasal cannula oxygen Cardiovascular status: stable and blood pressure returned to baseline Postop Assessment: no apparent nausea or vomiting Anesthetic complications: no   No notable events documented.   Last Vitals:  Vitals:   05/25/22 1146 05/25/22 1151  BP: 130/63 139/66  Pulse: (!) 56 (!) 59  Resp: 14 18  Temp: (!) 36.2 C (!) 36.2 C  SpO2: 97% 98%    Last Pain:  Vitals:   05/25/22 1151  TempSrc:   PainSc: 0-No pain                 Martha Clan

## 2022-05-26 ENCOUNTER — Other Ambulatory Visit: Payer: Self-pay

## 2022-05-26 ENCOUNTER — Encounter: Payer: Self-pay | Admitting: Ophthalmology

## 2022-06-04 NOTE — Discharge Instructions (Signed)
   Cataract Surgery, Care After ? ?This sheet gives you information about how to care for yourself after your surgery.  Your ophthalmologist may also give you more specific instructions.  If you have problems or questions, contact your doctor at [facility], [phone]. ? ?What can I expect after the surgery? ?It is common to have: ?Itching ?Foreign body sensation (feels like a grain of sand in the eye) ?Watery discharge (excess tearing) ?Sensitivity to light and touch ?Bruising in or around the eye ?Mild blurred vision ? ?Follow these instructions at home: ?Do not touch or rub your eyes. ?You may be told to wear a protective shield or sunglasses to protect your eyes. ?Do not put a contact lens in the operative eye unless your doctor approves. ?Keep the lids and face clean and dry. ?Do not allow water to hit you directly in the face while showering. ?Keep soap and shampoo out of your eyes. ?Do not use eye makeup for 1 week. ? ?Check your eye every day for signs of infection.  Watch for: ?Redness, swelling, or pain. ?Fluid, blood or pus. ?Worsening vision. ?Worsening sensitivity to light or touch. ? ?Activity: ?During the first day, avoid bending over and reading.  You may resume reading and bending the next day. ?Do not drive or use heavy machinery for at least 24 hours. ?Avoid strenuous activities for 1 week.  Activities such as walking, treadmill, exercise bike, and climbing stairs are okay. ?Do not lift heavy (>20 pound) objects for 1 week. ?Do not do yardwork, gardening, or dirty housework (mopping, cleaning bathrooms, vacuuming, etc.) for 1 week. ?Do not swim or use a hot tub for 2 weeks. ?Ask your doctor when you can return to work. ? ?General Instructions: ?Take or apply prescription and over-the-counter medicines as directed by your doctor, including eyedrops and ointments. ?Resume medications discontinued prior to surgery, unless told otherwise by your doctor. ?Keep all follow up appointments as  scheduled. ? ?Contact a health care provider if: ?You have increased bruising around your eye. ?You have pain that is not helped with medication. ?You have a fever. ?You have fluid, pus, or blood coming from your eye or incision. ?Your sensitivity to light gets worse. ?You have spots (floaters) of flashing lights in your vision. ?You have nausea or vomiting. ? ?Go to the nearest emergency room or call 911 if: ?You have sudden loss of vision. ?You have severe, worsening eye pain. ? ?

## 2022-06-08 ENCOUNTER — Ambulatory Visit: Payer: Medicare HMO | Admitting: Anesthesiology

## 2022-06-08 ENCOUNTER — Ambulatory Visit
Admission: RE | Admit: 2022-06-08 | Discharge: 2022-06-08 | Disposition: A | Discharge status: Home / Self Care | Payer: Medicare HMO | Attending: Ophthalmology | Admitting: Ophthalmology

## 2022-06-08 ENCOUNTER — Encounter: Payer: Self-pay | Admitting: Ophthalmology

## 2022-06-08 ENCOUNTER — Other Ambulatory Visit: Payer: Self-pay

## 2022-06-08 ENCOUNTER — Encounter
Admission: RE | Disposition: A | Discharge status: Home / Self Care | Payer: Self-pay | Source: Home / Self Care | Attending: Ophthalmology

## 2022-06-08 DIAGNOSIS — I509 Heart failure, unspecified: Secondary | ICD-10-CM | POA: Diagnosis not present

## 2022-06-08 DIAGNOSIS — I110 Hypertensive heart disease with heart failure: Secondary | ICD-10-CM | POA: Diagnosis not present

## 2022-06-08 DIAGNOSIS — E039 Hypothyroidism, unspecified: Secondary | ICD-10-CM | POA: Insufficient documentation

## 2022-06-08 DIAGNOSIS — R7303 Prediabetes: Secondary | ICD-10-CM | POA: Insufficient documentation

## 2022-06-08 DIAGNOSIS — H2512 Age-related nuclear cataract, left eye: Secondary | ICD-10-CM | POA: Diagnosis present

## 2022-06-08 HISTORY — PX: CATARACT EXTRACTION W/PHACO: SHX586

## 2022-06-08 SURGERY — PHACOEMULSIFICATION, CATARACT, WITH IOL INSERTION
Anesthesia: Monitor Anesthesia Care | Site: Eye | Laterality: Left

## 2022-06-08 MED ORDER — BRIMONIDINE TARTRATE-TIMOLOL 0.2-0.5 % OP SOLN
OPHTHALMIC | Status: DC | PRN
  Administered 2022-06-08: 1 [drp] via OPHTHALMIC

## 2022-06-08 MED ORDER — MOXIFLOXACIN HCL 0.5 % OP SOLN
OPHTHALMIC | Status: DC | PRN
  Administered 2022-06-08: .2 mL via OPHTHALMIC

## 2022-06-08 MED ORDER — TETRACAINE HCL 0.5 % OP SOLN
1.0000 [drp] | OPHTHALMIC | Status: DC | PRN
  Administered 2022-06-08 (×3): 1 [drp] via OPHTHALMIC

## 2022-06-08 MED ORDER — MIDAZOLAM HCL 2 MG/2ML IJ SOLN
INTRAMUSCULAR | Status: DC | PRN
  Administered 2022-06-08: 1 mg via INTRAVENOUS

## 2022-06-08 MED ORDER — ARMC OPHTHALMIC DILATING DROPS
1.0000 | OPHTHALMIC | Status: DC | PRN
Start: 2022-06-08 — End: 2022-06-08
  Administered 2022-06-08 (×3): 1 via OPHTHALMIC

## 2022-06-08 MED ORDER — SIGHTPATH DOSE#1 BSS IO SOLN
INTRAOCULAR | Status: DC | PRN
  Administered 2022-06-08: 72 mL via OPHTHALMIC

## 2022-06-08 MED ORDER — SIGHTPATH DOSE#1 BSS IO SOLN
INTRAOCULAR | Status: DC | PRN
  Administered 2022-06-08: 1 mL via INTRAMUSCULAR

## 2022-06-08 MED ORDER — SIGHTPATH DOSE#1 BSS IO SOLN
INTRAOCULAR | Status: DC | PRN
  Administered 2022-06-08: 15 mL

## 2022-06-08 MED ORDER — SIGHTPATH DOSE#1 NA CHONDROIT SULF-NA HYALURON 40-17 MG/ML IO SOLN
INTRAOCULAR | Status: DC | PRN
  Administered 2022-06-08: 1 mL via INTRAOCULAR

## 2022-06-08 MED ORDER — FENTANYL CITRATE (PF) 100 MCG/2ML IJ SOLN
INTRAMUSCULAR | Status: DC | PRN
  Administered 2022-06-08 (×2): 50 ug via INTRAVENOUS

## 2022-06-08 SURGICAL SUPPLY — 9 items
CATARACT SUITE SIGHTPATH (MISCELLANEOUS) ×1 IMPLANT
FEE CATARACT SUITE SIGHTPATH (MISCELLANEOUS) ×1 IMPLANT
GLOVE BIOGEL PI IND STRL 8 (GLOVE) ×1 IMPLANT
GLOVE SURG ENC TEXT LTX SZ8 (GLOVE) ×1 IMPLANT
LENS IOL TECNIS EYHANCE 21.0 (Intraocular Lens) IMPLANT
NDL FILTER BLUNT 18X1 1/2 (NEEDLE) ×1 IMPLANT
NEEDLE FILTER BLUNT 18X1 1/2 (NEEDLE) ×1 IMPLANT
SYR 3ML LL SCALE MARK (SYRINGE) ×1 IMPLANT
WATER STERILE IRR 250ML POUR (IV SOLUTION) ×1 IMPLANT

## 2022-06-08 NOTE — Transfer of Care (Signed)
Immediate Anesthesia Transfer of Care Note  Patient: Natasha Farrell  Procedure(s) Performed: CATARACT EXTRACTION PHACO AND INTRAOCULAR LENS PLACEMENT (IOC) LEFT  6.05  00:45.4 (Left: Eye)  Patient Location: PACU  Anesthesia Type: MAC  Level of Consciousness: awake, alert  and patient cooperative  Airway and Oxygen Therapy: Patient Spontanous Breathing and Patient connected to supplemental oxygen  Post-op Assessment: Post-op Vital signs reviewed, Patient's Cardiovascular Status Stable, Respiratory Function Stable, Patent Airway and No signs of Nausea or vomiting  Post-op Vital Signs: Reviewed and stable  Complications: No notable events documented.

## 2022-06-08 NOTE — Op Note (Signed)
PREOPERATIVE DIAGNOSIS:  Nuclear sclerotic cataract of the left eye.   POSTOPERATIVE DIAGNOSIS:  Nuclear sclerotic cataract of the left eye.   OPERATIVE PROCEDURE:ORPROCALL@   SURGEON:  Birder Robson, MD.   ANESTHESIA:  Anesthesiologist: Ilene Qua, MD CRNA: Moises Blood, CRNA  1.      Managed anesthesia care. 2.     0.43m of Shugarcaine was instilled following the paracentesis   COMPLICATIONS:  None.   TECHNIQUE:   Stop and chop   DESCRIPTION OF PROCEDURE:  The patient was examined and consented in the preoperative holding area where the aforementioned topical anesthesia was applied to the left eye and then brought back to the Operating Room where the left eye was prepped and draped in the usual sterile ophthalmic fashion and a lid speculum was placed. A paracentesis was created with the side port blade and the anterior chamber was filled with viscoelastic. A near clear corneal incision was performed with the steel keratome. A continuous curvilinear capsulorrhexis was performed with a cystotome followed by the capsulorrhexis forceps. Hydrodissection and hydrodelineation were carried out with BSS on a blunt cannula. The lens was removed in a stop and chop  technique and the remaining cortical material was removed with the irrigation-aspiration handpiece. The capsular bag was inflated with viscoelastic and the Technis [id number] lens was placed in the capsular bag without complication. The remaining viscoelastic was removed from the eye with the irrigation-aspiration handpiece. The wounds were hydrated. The anterior chamber was flushed with BSS and the eye was inflated to physiologic pressure. 0.128mVigamox was placed in the anterior chamber. The wounds were found to be water tight. The eye was dressed with Combigan. The patient was given protective glasses to wear throughout the day and a shield with which to sleep tonight. The patient was also given drops with which to begin a drop regimen  today and will follow-up with me in one day. Implant Name Type Inv. Item Serial No. Manufacturer Lot No. LRB No. Used Action  LENS IOL TECNIS EYHANCE 21.0 - [id number] Lens LENS IOL TECNIS EYHANCE 21.0 [id number]  Left 1 Implanted    Procedure(s): CATARACT EXTRACTION PHACO AND INTRAOCULAR LENS PLACEMENT (IOC) LEFT  6.05  00:45.4 (Left)  Electronically signed: WiBirder Robson/16/2024 9:59 AM

## 2022-06-08 NOTE — H&P (Signed)
[facility]   Primary Care Physician:  [name], MD Ophthalmologist: Dr. George Ina Pre-Procedure History & Physical: HPI:  Natasha Farrell is a 77 y.o. female here for cataract surgery.   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 There is no evidence of a T cell lymphoproliferative disorder   CATARACT EXTRACTION W/PHACO Right 05/25/2022   Procedure: CATARACT EXTRACTION PHACO AND INTRAOCULAR LENS PLACEMENT ([place]) RIGHT;  Surgeon: Birder Robson, MD;  Location: [place];  Service: Ophthalmology;  Laterality: Right;  6.39 0:38.5   LYMPH NODE BIOPSY Left 08/01/2020   Procedure: LYMPH NODE BIOPSY;  Surgeon: Robert Bellow, MD;  Location: [facility];  Service: General;  Laterality: Left;   PORTACATH PLACEMENT Left 08/22/2020   Procedure: INSERTION PORT-A-CATH;  Surgeon: Robert Bellow, MD;  Location: [facility];  Service: General;  Laterality: Left;    Prior to Admission medications   Medication Sig Start Date End Date Taking? Authorizing Provider  carvedilol (COREG) 25 MG tablet TAKE 1 TABLET BY MOUTH TWICE A DAY 10/14/21  Yes End, Harrell Gave, MD  Cholecalciferol (VITAMIN D3) 50 MCG (2000 UT) TABS Take by mouth daily.   Yes [provider]  COLLAGEN PO Take by mouth daily.   Yes [provider]  cyanocobalamin 1000 MCG tablet Take 1,000 mcg by mouth daily.   Yes [provider]  levothyroxine (SYNTHROID) 50 MCG tablet Take 50 mcg by mouth daily before breakfast.   Yes [provider]  loratadine (CLARITIN) 10 MG  tablet Take 10 mg by mouth daily as needed for allergies.   Yes [provider]  Multiple Vitamin (MULTIVITAMIN WITH MINERALS) TABS tablet Take 1 tablet by mouth daily. 08/21/20  Yes Enzo Bi, MD  pravastatin (PRAVACHOL) 40 MG tablet Take 40 mg by mouth daily.   Yes [provider]  spironolactone (ALDACTONE) 50 MG tablet Take 0.5 tablet (25 mg) by mouth once daily  07/25/20  Yes [provider]  acetaminophen (TYLENOL) 500 MG tablet Take 500 mg by mouth every 6 (six) hours as needed for moderate pain, fever or headache.    [provider]  lidocaine-prilocaine (EMLA) cream Apply 1 Application topically as needed. Apply small amount to port site at least 1 hour prior to it being accessed, cover with plastic wrap 02/12/22   Sindy Guadeloupe, MD    Allergies as of 04/20/2022 - Review Complete 02/17/2022  Allergen Reaction Noted   Lisinopril Swelling 01/05/2018    Family History  Problem Relation Age of Onset   Prostate cancer Father    Breast cancer Sister 70   Colon cancer Sister    Kidney cancer Sister    Breast cancer Sister     Social History   Socioeconomic History   Marital status: Married    Spouse name: Not on file   Number of children: Not on file   Years of education: Not on file   Highest education level: Not on file  Occupational History   Not on  file  Tobacco Use   Smoking status: Never   Smokeless tobacco: Never  Vaping Use   Vaping Use: Never used  Substance and Sexual Activity   Alcohol use: No   Drug use: No   Sexual activity: Not on file  Other Topics Concern   Not on file  Social History Narrative   Not on file   Social Determinants of Health   Financial Resource Strain: Not on file  Food Insecurity: Not on file  Transportation Needs: Not on file  Physical Activity: Not on file  Stress: Not on file  Social Connections: Not on file  Intimate Partner Violence: Not on file    Review of Systems: See HPI, otherwise  negative ROS  Physical Exam: BP (!) 142/64   Temp 98.5 F (36.9 C) (Tympanic)   Resp 17   Ht 5' 4.02" (1.626 m)   Wt 82.6 kg   SpO2 97%   BMI 31.22 kg/m  General:   Alert, cooperative in NAD Head:  Normocephalic and atraumatic. Respiratory:  Normal work of breathing. Cardiovascular:  RRR  Impression/Plan: CALYSTA CRAIGO is here for cataract surgery.  Risks, benefits, limitations, and alternatives regarding cataract surgery have been reviewed with the patient.  Questions have been answered.  All parties agreeable.   Birder Robson, MD  06/08/2022, 9:35 AM

## 2022-06-08 NOTE — Anesthesia Preprocedure Evaluation (Signed)
Anesthesia Evaluation  Patient identified by MRN, date of birth, ID band Patient awake    Reviewed: Allergy & Precautions, H&P , NPO status , Patient's Chart, lab work & pertinent test results  History of Anesthesia Complications Negative for: history of anesthetic complications  Airway Mallampati: III  TM Distance: >3 FB Neck ROM: limited    Dental  (+) Chipped, Poor Dentition, Missing, Implants, Dental Advidsory Given, Partial Upper, Partial Lower   Pulmonary neg pulmonary ROS, neg shortness of breath, neg COPD, neg recent URI   Pulmonary exam normal        Cardiovascular Exercise Tolerance: Good hypertension, (-) angina +CHF  (-) Past MI and (-) Cardiac Stents Normal cardiovascular exam+ dysrhythmias (-) Valvular Problems/Murmurs     Neuro/Psych negative neurological ROS  negative psych ROS   GI/Hepatic negative GI ROS, Neg liver ROS,,,  Endo/Other  diabetes (borderline)Hypothyroidism    Renal/GU negative Renal ROS  negative genitourinary   Musculoskeletal   Abdominal   Peds  Hematology negative hematology ROS (+)   Anesthesia Other Findings Past Medical History: No date: Hx of hysterectomy No date: Hyperlipidemia No date: Hypertension No date: Thyroid disease     Comment:  rt thryroidectomy  09/25/2010  Past Surgical History: 04/21/2020: BREAST BIOPSY; Right     Comment:  hydromark 3 [place] bx path pending 08/01/2020: LYMPH NODE BIOPSY; Left     Comment:  Procedure: LYMPH NODE BIOPSY;  Surgeon: Byrnett, Jeffrey              W, MD;  Location: [facility];  Service: General;                Laterality: Left;     Reproductive/Obstetrics negative OB ROS                             Anesthesia Physical Anesthesia Plan  ASA: 2  Anesthesia Plan: MAC   Post-op Pain Management:    Induction: Intravenous  PONV Risk Score and Plan: 2 and Midazolam and Treatment may vary due to age or  medical condition  Airway Management Planned: Natural Airway and Nasal Cannula  Additional Equipment:   Intra-op Plan:   Post-operative Plan:   Informed Consent: I have reviewed the patients History and Physical, chart, labs and discussed the procedure including the risks, benefits and alternatives for the proposed anesthesia with the patient or authorized representative who has indicated his/her understanding and acceptance.     Dental Advisory Given  Plan Discussed with: Anesthesiologist, CRNA and Surgeon  Anesthesia Plan Comments: (Patient consented for risks of anesthesia including but not limited to:  - adverse reactions to medications - risk of airway placement if required - damage to eyes, teeth, lips or other oral mucosa - nerve damage due to positioning  - sore throat or hoarseness - Damage to heart, brain, nerves, lungs, other parts of body or loss of life  Patient voiced understanding.)        Anesthesia Quick Evaluation  

## 2022-06-08 NOTE — Anesthesia Postprocedure Evaluation (Signed)
Anesthesia Post Note  Patient: ASHMI BLAS  Procedure(s) Performed: CATARACT EXTRACTION PHACO AND INTRAOCULAR LENS PLACEMENT (IOC) LEFT  6.05  00:45.4 (Left: Eye)  Patient location during evaluation: PACU Anesthesia Type: MAC Level of consciousness: awake and alert Pain management: pain level controlled Vital Signs Assessment: post-procedure vital signs reviewed and stable Respiratory status: spontaneous breathing, nonlabored ventilation, respiratory function stable and patient connected to nasal cannula oxygen Cardiovascular status: stable and blood pressure returned to baseline Postop Assessment: no apparent nausea or vomiting Anesthetic complications: no   No notable events documented.   Last Vitals:  Vitals:   06/08/22 1001 06/08/22 1004  BP: 137/67 136/72  Pulse: 64 64  Resp:  15  Temp:    SpO2: 100% 97%    Last Pain:  Vitals:   06/08/22 1001  TempSrc:   PainSc: 0-No pain                 Ilene Qua

## 2022-06-09 ENCOUNTER — Encounter: Payer: Self-pay | Admitting: Ophthalmology

## 2022-06-21 ENCOUNTER — Inpatient Hospital Stay: Payer: Medicare HMO

## 2022-07-19 ENCOUNTER — Other Ambulatory Visit: Payer: Self-pay | Admitting: Internal Medicine

## 2022-07-21 ENCOUNTER — Other Ambulatory Visit
Admission: RE | Admit: 2022-07-21 | Discharge: 2022-07-21 | Disposition: A | Discharge status: Home / Self Care | Payer: Medicare HMO | Attending: Internal Medicine | Admitting: Internal Medicine

## 2022-07-21 DIAGNOSIS — E785 Hyperlipidemia, unspecified: Secondary | ICD-10-CM

## 2022-07-21 LAB — LIPID PANEL
Cholesterol: 166 mg/dL (ref 0–200)
HDL: 51 mg/dL (ref >40)
LDL Cholesterol: 98 mg/dL (ref 0–99)
Total CHOL/HDL Ratio: 3.3 RATIO
Triglycerides: 85 mg/dL (ref <150)
VLDL: 17 mg/dL (ref 0–40)

## 2022-08-13 ENCOUNTER — Ambulatory Visit
Admission: RE | Admit: 2022-08-13 | Discharge: 2022-08-13 | Disposition: A | Discharge status: Home / Self Care | Payer: Medicare HMO | Source: Ambulatory Visit | Attending: Oncology | Admitting: Oncology

## 2022-08-13 DIAGNOSIS — C8219 Follicular lymphoma grade II, extranodal and solid organ sites: Secondary | ICD-10-CM | POA: Insufficient documentation

## 2022-08-13 MED ORDER — IOHEXOL 300 MG/ML  SOLN
100.0000 mL | Freq: Once | INTRAMUSCULAR | Status: AC | PRN
  Administered 2022-08-13: 100 mL via INTRAVENOUS

## 2022-08-20 ENCOUNTER — Inpatient Hospital Stay: Payer: Medicare HMO

## 2022-08-25 ENCOUNTER — Inpatient Hospital Stay (HOSPITAL_BASED_OUTPATIENT_CLINIC_OR_DEPARTMENT_OTHER): Payer: Medicare HMO | Admitting: Oncology

## 2022-08-25 ENCOUNTER — Inpatient Hospital Stay: Payer: Medicare HMO | Attending: Oncology

## 2022-08-25 ENCOUNTER — Encounter: Payer: Self-pay | Admitting: Oncology

## 2022-08-25 ENCOUNTER — Other Ambulatory Visit: Payer: Self-pay | Admitting: *Deleted

## 2022-08-25 VITALS — BP 120/54 | HR 72 | Temp 96.7°F | Ht 64.0 in | Wt 180.0 lb

## 2022-08-25 DIAGNOSIS — C8290 Follicular lymphoma, unspecified, unspecified site: Secondary | ICD-10-CM | POA: Diagnosis present

## 2022-08-25 DIAGNOSIS — C8219 Follicular lymphoma grade II, extranodal and solid organ sites: Secondary | ICD-10-CM

## 2022-08-25 DIAGNOSIS — Z8572 Personal history of non-Hodgkin lymphomas: Secondary | ICD-10-CM

## 2022-08-25 DIAGNOSIS — Z95828 Presence of other vascular implants and grafts: Secondary | ICD-10-CM

## 2022-08-25 DIAGNOSIS — D696 Thrombocytopenia, unspecified: Secondary | ICD-10-CM

## 2022-08-25 DIAGNOSIS — Z08 Encounter for follow-up examination after completed treatment for malignant neoplasm: Secondary | ICD-10-CM | POA: Diagnosis not present

## 2022-08-25 LAB — CBC WITH DIFFERENTIAL/PLATELET
Abs Immature Granulocytes: 0.03 10*3/uL (ref 0.00–0.07)
Basophils Absolute: 0 10*3/uL (ref 0.0–0.1)
Basophils Relative: 1 %
Eosinophils Absolute: 0 10*3/uL (ref 0.0–0.5)
Eosinophils Relative: 1 %
HCT: 39.5 % (ref 36.0–46.0)
Hemoglobin: 13.3 g/dL (ref 12.0–15.0)
Immature Granulocytes: 1 %
Lymphocytes Relative: 32 %
Lymphs Abs: 1.5 10*3/uL (ref 0.7–4.0)
MCH: 30.7 pg (ref 26.0–34.0)
MCHC: 33.7 g/dL (ref 30.0–36.0)
MCV: 91.2 fL (ref 80.0–100.0)
Monocytes Absolute: 1 10*3/uL (ref 0.1–1.0)
Monocytes Relative: 21 %
Neutro Abs: 2.2 10*3/uL (ref 1.7–7.7)
Neutrophils Relative %: 44 %
Platelets: 74 10*3/uL — ABNORMAL LOW (ref 150–400)
RBC: 4.33 MIL/uL (ref 3.87–5.11)
RDW: 13.6 % (ref 11.5–15.5)
WBC: 4.9 10*3/uL (ref 4.0–10.5)
nRBC: 0 % (ref 0.0–0.2)

## 2022-08-25 LAB — COMPREHENSIVE METABOLIC PANEL
ALT: 23 U/L (ref 0–44)
AST: 31 U/L (ref 15–41)
Albumin: 4.7 g/dL (ref 3.5–5.0)
Alkaline Phosphatase: 79 U/L (ref 38–126)
Anion gap: 9 (ref 5–15)
BUN: 7 mg/dL — ABNORMAL LOW (ref 8–23)
CO2: 29 mmol/L (ref 22–32)
Calcium: 9.6 mg/dL (ref 8.9–10.3)
Chloride: 101 mmol/L (ref 98–111)
Creatinine, Ser: 0.76 mg/dL (ref 0.44–1.00)
GFR, Estimated: >60 mL/min (ref >60)
Glucose, Bld: 99 mg/dL (ref 70–99)
Potassium: 3.8 mmol/L (ref 3.5–5.1)
Sodium: 139 mmol/L (ref 135–145)
Total Bilirubin: 0.5 mg/dL (ref 0.3–1.2)
Total Protein: 7.2 g/dL (ref 6.5–8.1)

## 2022-08-25 LAB — LACTATE DEHYDROGENASE: LDH: 146 U/L (ref 98–192)

## 2022-08-25 MED ORDER — HEPARIN SOD (PORK) LOCK FLUSH 100 UNIT/ML IV SOLN
500.0000 [IU] | Freq: Once | INTRAVENOUS | Status: DC
  Filled 2022-08-25: qty 5

## 2022-08-25 MED ORDER — SODIUM CHLORIDE 0.9% FLUSH
10.0000 mL | INTRAVENOUS | Status: DC | PRN
  Filled 2022-08-25: qty 10

## 2022-08-25 NOTE — Patient Instructions (Signed)

## 2022-08-25 NOTE — Progress Notes (Signed)
No concerns for the provider today. 

## 2022-08-28 NOTE — Progress Notes (Signed)
Hematology/Oncology Consult note [facility]  [phone] [phone]  Patient Care Team: Patrice Paradise, MD as PCP - General (Physician Assistant) Lemar Livings Merrily Pew, MD as Consulting Physician (General Surgery) End, Cristal Deer, MD as Consulting Physician (Cardiology) Creig Hines, MD as Consulting Physician (Hematology and Oncology)   Name of the patient: Natasha Farrell  [id number]  06/19/1945   Date of visit: 08/28/22  Diagnosis- stage IV follicular lymphoma currently in remission   Chief complaint/ Reason for visit- routine f/u of follicular lymphoma  Heme/Onc history: Patient is a 77 year old female now transfer of care from Dr. Merlene Pulling.  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  She had presented with extensive adenopathy and massive splenomegaly.   Right axillary ultrasound guided biopsy on 04/21/2020 revealed fragments of lymph node with reactive follicular hyperplasia. There was no definite evidence of malignancy. Flow cytometry revealed a CD10+ B cell population. Clonality could not be evaluated due to non-specific light chain binding. There was no evidence of a T cell lymphoproliferative disorder.      PET scan on 08/12/2020 revealed widespread adenopathy, visceral involvement and signs of near diffuse, multifocal bony involvement, distribution most c/w lymphoma, Deauville category 5. There was ileal involvement as well. Peritoneal involvement was suggested as well with nodular changes about the peritoneum but without significant FDG uptake. There was paraspinal soft tissue without frank bony destruction. Scattered areas of sclerosis underestimated the degree of bony involvement. Abdominal ascites may be related to anasarca and or lymphatic congestion with marked enlargement of LEFT-sided pleural effusion and mild enlargement of small RIGHT-sided pleural fluid.  Left-sided pleural effusion has been  attributed to follicular lymphoma and she has undergone multiple thoracentesis in the past with the last one that was done in April 2022.     Bone marrow on 08/04/2020 revealed variably cellular bone marrow with trilineage hematopoiesis and involvement by an atypical lymphoid proliferation  The bone marrow core biopsy demonstrated involvement by an atypical lymphoid proliferation with paratrabecular lymphoid aggregates and  patchy infiltration of the bone marrow space with accompanying fibrosis. The infiltrate was composed of mixed B-cells and T-cells. At least a subset of the B-cells co-expressed CD10 and BCL6 without definitive BCL2 expression. Scattered larger CD20-positive larger B-cells were also present. Flow cytometry showed no definitive atypical B-cell population. No immunophenotypically aberrant T-cell population was identified. There were no increase in blasts. Monocytes showed heterogeneous CD56 expression.  Cytogenetics were normal (46, XX).   Patient was seen by me for the first time prior to cycle 5 of R-CHOP chemotherapy and did not get any interim scans after 3 cycles.  Plan is therefore to get a PET scan after 6 cycles of R-CHOP chemotherapy   Scans after 6 cycles of R-CHOP chemotherapy showed decrease in the size of adenopathy both above and below the diaphragm with significant reduction/resolution in the abnormal hypermetabolic activity.  There was persistent low-level hypermetabolic activity noted in the abdominal and pelvic lymph nodes with an SUV between 1-3 but minimally about the background mediastinal blood pool activity.  Deauville 2/3   Patient also went for second opinion to [facility] and overall decision was made against maintenance Rituxan.    Interval history-she is doing well presently and denies any complaints at this time.  Appetite and weight have remained stable.  Denies any fatigue or drenching night sweats.  Denies any lumps or bumps anywhere.  ECOG PS- 0 Pain scale-  0  Review of  systems- Review of Systems  Constitutional:  Negative for chills, fever, malaise/fatigue and weight loss.  HENT:  Negative for congestion, ear discharge and nosebleeds.   Eyes:  Negative for blurred vision.  Respiratory:  Negative for cough, hemoptysis, sputum production, shortness of breath and wheezing.   Cardiovascular:  Negative for chest pain, palpitations, orthopnea and claudication.  Gastrointestinal:  Negative for abdominal pain, blood in stool, constipation, diarrhea, heartburn, melena, nausea and vomiting.  Genitourinary:  Negative for dysuria, flank pain, frequency, hematuria and urgency.  Musculoskeletal:  Negative for back pain, joint pain and myalgias.  Skin:  Negative for rash.  Neurological:  Negative for dizziness, tingling, focal weakness, seizures, weakness and headaches.  Endo/Heme/Allergies:  Does not bruise/bleed easily.  Psychiatric/Behavioral:  Negative for depression and suicidal ideas. The patient does not have insomnia.       Allergies  Allergen Reactions   Lisinopril Swelling    angioedema     Past Medical History:  Diagnosis Date   Arthritis    Follicular lymphoma 2022   Hx of hysterectomy    Hyperlipidemia    Hypertension    Thyroid disease    rt thryroidectomy  09/25/2010   Wears dentures    partial upper and lower     Past Surgical History:  Procedure Laterality Date   ABDOMINAL HYSTERECTOMY     BREAST BIOPSY Right 04/21/2020   hydromark 3 [place] bx FRAGMENTS OF LYMPH NODE WITH REACTIVE FOLLICULARHYPERPLASIA of the RIGHT axilla. There is no evidence of a T cell lymphoproliferative disorder   CATARACT EXTRACTION W/PHACO Right 05/25/2022   Procedure: CATARACT EXTRACTION PHACO AND INTRAOCULAR LENS PLACEMENT (IOC) RIGHT;  Surgeon: Galen Manila, MD;  Location: [facility];  Service: Ophthalmology;  Laterality: Right;  6.39 0:38.5   CATARACT EXTRACTION W/PHACO Left 06/08/2022   Procedure: CATARACT EXTRACTION PHACO AND  INTRAOCULAR LENS PLACEMENT (IOC) LEFT  6.05  00:45.4;  Surgeon: Galen Manila, MD;  Location: [facility];  Service: Ophthalmology;  Laterality: Left;   LYMPH NODE BIOPSY Left 08/01/2020   Procedure: LYMPH NODE BIOPSY;  Surgeon: Earline Mayotte, MD;  Location: [facility];  Service: General;  Laterality: Left;   PORTACATH PLACEMENT Left 08/22/2020   Procedure: INSERTION PORT-A-CATH;  Surgeon: Earline Mayotte, MD;  Location: [facility];  Service: General;  Laterality: Left;    Social History   Socioeconomic History   Marital status: Married    Spouse name: Not on file   Number of children: Not on file   Years of education: Not on file   Highest education level: Not on file  Occupational History   Not on file  Tobacco Use   Smoking status: Never   Smokeless tobacco: Never  Vaping Use   Vaping Use: Never used  Substance and Sexual Activity   Alcohol use: No   Drug use: No   Sexual activity: Not on file  Other Topics Concern   Not on file  Social History Narrative   Not on file   Social Determinants of Health   Financial Resource Strain: Not on file  Food Insecurity: Not on file  Transportation Needs: Not on file  Physical Activity: Not on file  Stress: Not on file  Social Connections: Not on file  Intimate Partner Violence: Not on file    Family History  Problem Relation Age of Onset   Prostate cancer Father    Breast cancer Sister 51   Colon cancer Sister    Kidney cancer Sister  Breast cancer Sister      Current Outpatient Medications:    acetaminophen (TYLENOL) 500 MG tablet, Take 500 mg by mouth every 6 (six) hours as needed for moderate pain, fever or headache., Disp: , Rfl:    carvedilol (COREG) 25 MG tablet, TAKE 1 TABLET BY MOUTH TWICE A DAY, Disp: 180 tablet, Rfl: 1   Cholecalciferol (VITAMIN D3) 50 MCG (2000 UT) TABS, Take by mouth daily., Disp: , Rfl:    COLLAGEN PO, Take by mouth daily., Disp: , Rfl:    cyanocobalamin 1000 MCG tablet,  Take 1,000 mcg by mouth daily., Disp: , Rfl:    levothyroxine (SYNTHROID) 50 MCG tablet, Take 50 mcg by mouth daily before breakfast., Disp: , Rfl:    lidocaine-prilocaine (EMLA) cream, Apply 1 Application topically as needed. Apply small amount to port site at least 1 hour prior to it being accessed, cover with plastic wrap, Disp: 30 g, Rfl: 2   loratadine (CLARITIN) 10 MG tablet, Take 10 mg by mouth daily as needed for allergies., Disp: , Rfl:    Multiple Vitamin (MULTIVITAMIN WITH MINERALS) TABS tablet, Take 1 tablet by mouth daily., Disp: , Rfl:    pravastatin (PRAVACHOL) 40 MG tablet, Take 40 mg by mouth daily., Disp: , Rfl:    spironolactone (ALDACTONE) 50 MG tablet, Take 0.5 tablet (25 mg) by mouth once daily , Disp: , Rfl:  No current facility-administered medications for this visit.  Facility-Administered Medications Ordered in Other Visits:    sodium chloride flush (NS) 0.9 % injection 10 mL, 10 mL, Intracatheter, PRN, Creig Hinesao, Leeam Cedrone C, MD, 10 mL at 12/10/20 0830  Physical exam:  Vitals:   08/25/22 1026  BP: (!) 120/54  Pulse: 72  Temp: (!) 96.7 F (35.9 C)  TempSrc: Tympanic  SpO2: 100%  Weight: 180 lb (81.6 kg)  Height: 5\' 4"  (1.626 m)   Physical Exam Cardiovascular:     Rate and Rhythm: Normal rate and regular rhythm.     Heart sounds: Normal heart sounds.  Pulmonary:     Effort: Pulmonary effort is normal.     Breath sounds: Normal breath sounds.  Abdominal:     General: Bowel sounds are normal.     Palpations: Abdomen is soft.     Comments: No Palpable hepatosplenomegaly  Lymphadenopathy:     Comments: No palpable cervical, supraclavicular, axillary or inguinal adenopathy    Skin:    General: Skin is warm and dry.  Neurological:     Mental Status: She is alert and oriented to person, place, and time.         Latest Ref Rng & Units 08/25/2022   10:04 AM  CMP  Glucose 70 - 99 mg/dL 99   BUN 8 - 23 mg/dL 7   Creatinine 1.610.44 - 0.961.00 mg/dL 0.450.76   Sodium 409135 -  811145 mmol/L 139   Potassium 3.5 - 5.1 mmol/L 3.8   Chloride 98 - 111 mmol/L 101   CO2 22 - 32 mmol/L 29   Calcium 8.9 - 10.3 mg/dL 9.6   Total Protein 6.5 - 8.1 g/dL 7.2   Total Bilirubin 0.3 - 1.2 mg/dL 0.5   Alkaline Phos 38 - 126 U/L 79   AST 15 - 41 U/L 31   ALT 0 - 44 U/L 23       Latest Ref Rng & Units 08/25/2022   10:04 AM  CBC  WBC 4.0 - 10.5 K/uL 4.9   Hemoglobin 12.0 - 15.0 g/dL 13.3  Hematocrit 36.0 - 46.0 % 39.5   Platelets 150 - 400 K/uL 74     No images are attached to the encounter.  CT CHEST ABDOMEN PELVIS W CONTRAST  Result Date: 08/13/2022 CLINICAL DATA:  Follicular lymphoma in remission, restaging * Tracking Code: BO * EXAM: CT CHEST, ABDOMEN, AND PELVIS WITH CONTRAST TECHNIQUE: Multidetector CT imaging of the chest, abdomen and pelvis was performed following the standard protocol during bolus administration of intravenous contrast. RADIATION DOSE REDUCTION: This exam was performed according to the departmental dose-optimization program which includes automated exposure control, adjustment of the mA and/or kV according to patient size and/or use of iterative reconstruction technique. CONTRAST:  100mL OMNIPAQUE IOHEXOL 300 MG/ML SOLN additional oral enteric contrast COMPARISON:  02/09/2022 FINDINGS: CT CHEST FINDINGS Cardiovascular: Left chest port catheter. Aortic atherosclerosis. Normal heart size. Left coronary artery calcifications. No pericardial effusion. Mediastinum/Nodes: No enlarged mediastinal, hilar, or axillary lymph nodes. Thyroid gland, trachea, and esophagus demonstrate no significant findings. Lungs/Pleura: Lungs are clear. No pleural effusion or pneumothorax. Musculoskeletal: No chest wall abnormality. No acute osseous findings. CT ABDOMEN PELVIS FINDINGS Hepatobiliary: No solid liver abnormality is seen. No gallstones, gallbladder wall thickening, or biliary dilatation. Pancreas: Unremarkable. No pancreatic ductal dilatation or surrounding inflammatory  changes. Spleen: Normal in size without significant abnormality. Adrenals/Urinary Tract: Adrenal glands are unremarkable. Small nonobstructive calculus of the superior pole of the left kidney. No right-sided calculi, ureteral calculi, or hydronephrosis bladder is unremarkable. Stomach/Bowel: Stomach is within normal limits. Appendix is not clearly visualized. No evidence of bowel wall thickening, distention, or inflammatory changes. Vascular/Lymphatic: Aortic atherosclerosis. Unchanged small, matted appearing treated retroperitoneal lymph nodes, index node measuring 1.2 x 0.7 cm (series 2, image 69). Reproductive: Status post hysterectomy. Other: No abdominal wall hernia or abnormality. No ascites. Musculoskeletal: No acute osseous findings. IMPRESSION: 1. Unchanged small, matted appearing treated retroperitoneal lymph nodes. No new lymphadenopathy in the chest, abdomen, or pelvis. 2. Nonobstructive left nephrolithiasis. 3. Coronary artery disease. Aortic Atherosclerosis (ICD10-I70.0). Electronically Signed   By: Jearld LeschAlex D Bibbey M.D.   On: 08/13/2022 17:24     Assessment and plan- Patient is a 77 y.o. female with stage IV follicular lymphoma s/p 6 cycles of R-CHOP currently in CR1.  She is here for routine follow-up  Patient has no B symptoms and on exam she does not have any palpable adenopathy or palpable hepatosplenomegaly.  Labs are unremarkable.  She remains clinically in remission.  I reviewed her CT chest abdomen and pelvis images independently and discussedWith the patient which shows no progression of her follicular lymphoma.  She has small retroperitoneal lymph nodes measuring up to 1.2 cm which have remained stable and does not require treatment.  She can get her port taken out at this time.  I will see her back in 6 months with labs.  I will move to yearly scans at this time   Visit Diagnosis 1. Encounter for follow-up surveillance of lymphoma   2. Thrombocytopenia      Dr. Owens SharkArchana Bonny Egger, MD,  [facility] [phone] 08/28/2022 9:51 AM

## 2022-09-06 ENCOUNTER — Encounter: Payer: Self-pay | Admitting: Oncology

## 2022-12-27 ENCOUNTER — Encounter: Payer: Self-pay | Admitting: Oncology

## 2023-01-11 IMAGING — US US BREAST*R* LIMITED INC AXILLA
1 series · 11 of 11 positions shown · non-contrast
Comparison: Previous exam(s).

CLINICAL DATA: Three-month follow-up of bilateral lymphadenopathy.
Patient was biopsied in [REDACTED] with benign results. However there
has been continued increase in axillary lymphadenopathy and
splenomegaly.

EXAM:
ULTRASOUND OF THE BILATERAL BREAST

[Series 1: us breast*right* limited inc axilla · 0.06mm/px · 11 of 11 slices shown]
[im 1/11]
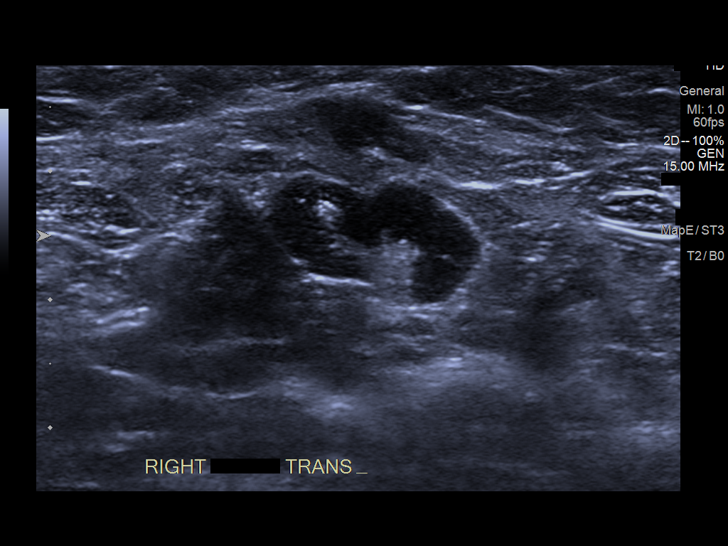
[im 2/11]
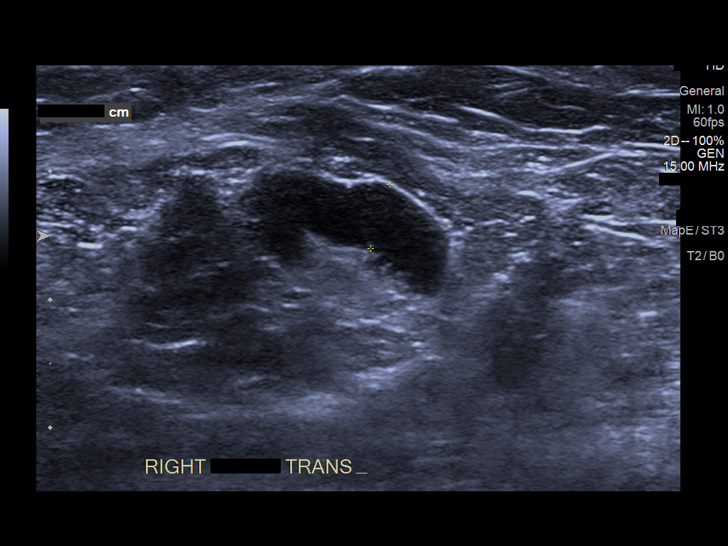
[im 3/11]
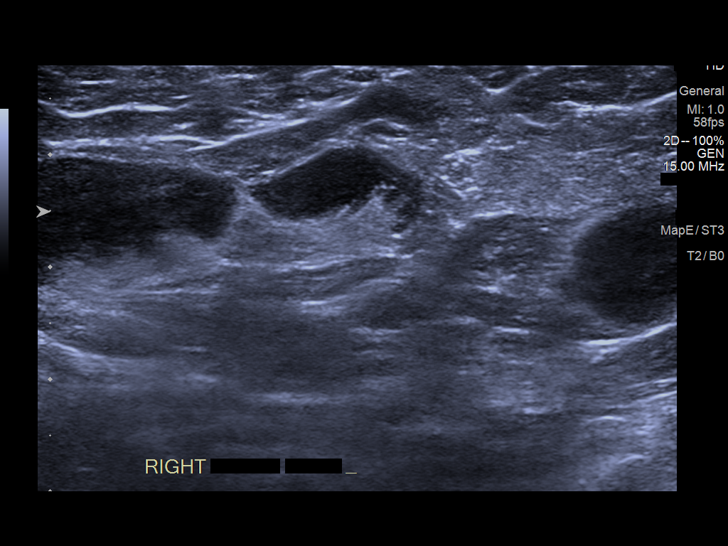
[im 4/11]
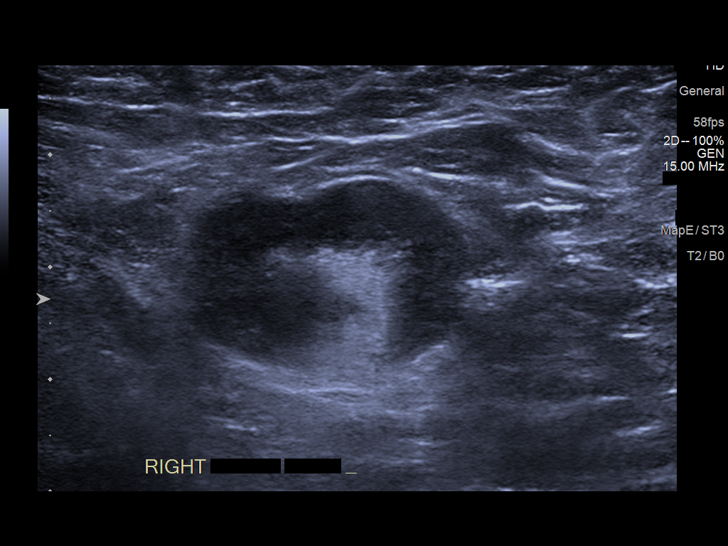
[im 5/11]
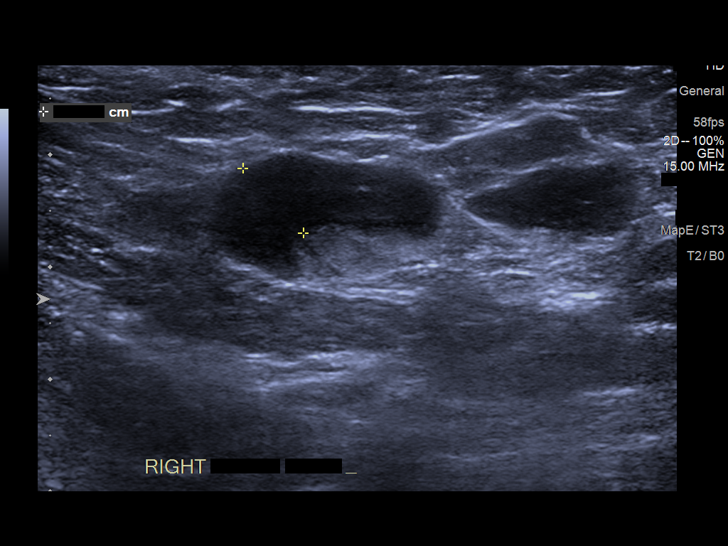
[im 6/11]
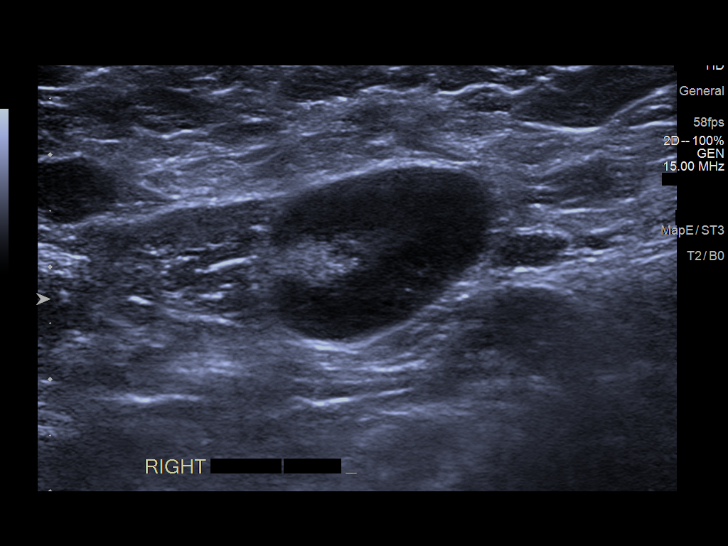
[im 7/11]
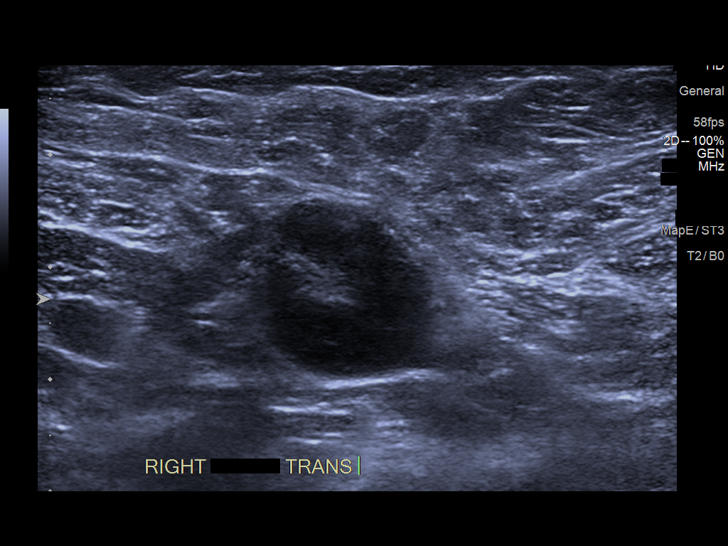
[im 8/11]
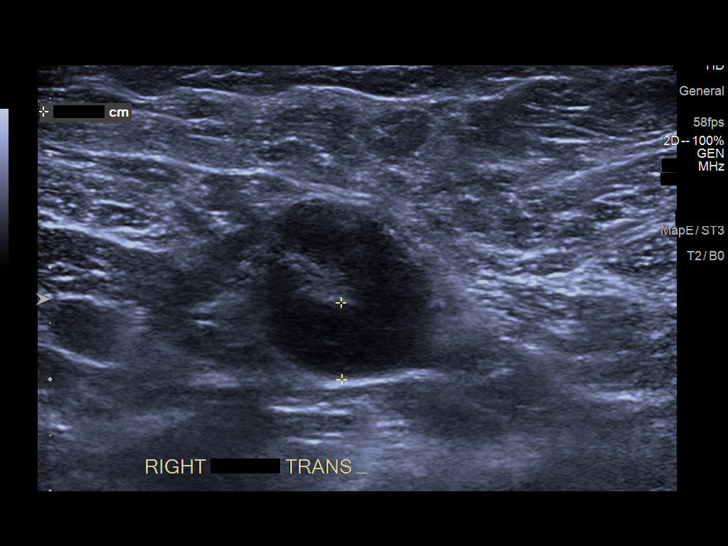
[im 9/11]
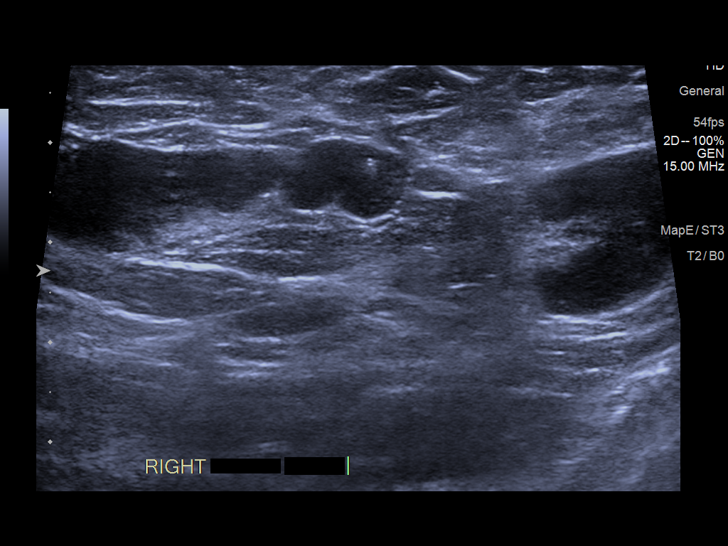
[im 10/11]
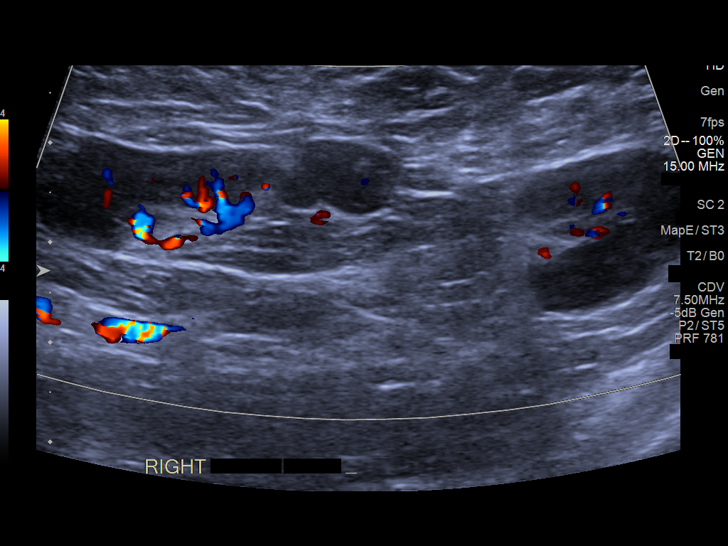
[im 11/11]
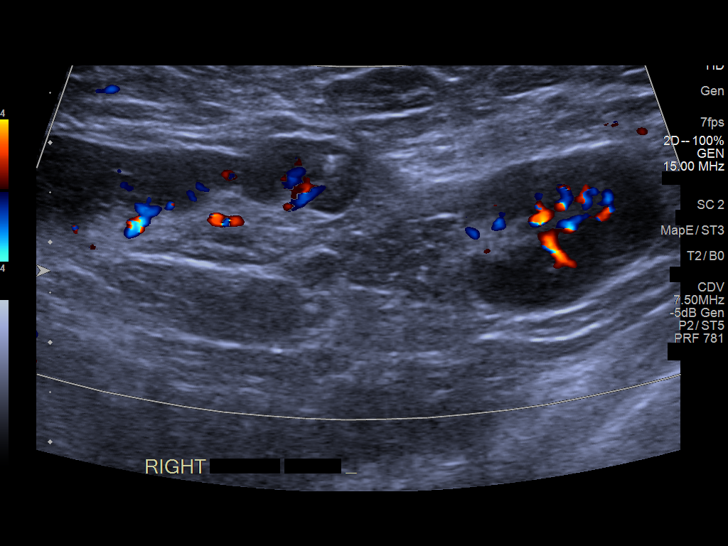

[11 of 11 positions shown; findings below may reference images not displayed]

FINDINGS: Targeted ultrasound was performed of the RIGHT axilla. There are
multiple enlarged RIGHT axillary lymph nodes with diffusely
thickened cortices and benign echogenic hila. Representative
cortical thickness measures up to 8 mm. This is increased in
comparison to prior. Biopsy clip is noted within an axillary lymph
node with a diffusely thickened cortex which was previously biopsied
with benign results.

Targeted ultrasound was performed of the LEFT axilla. There are
multiple enlarged LEFT axillary lymph nodes with diffusely thickened
cortices and benign echogenic hila. Representative cortex measures 8
mm in thickness. This is increased in comparison to prior.
IMPRESSION: 1. There is continued increase in bilateral axillary
lymphadenopathy. Given systemic increase in adenopathy, this remains
concerning for an underlying lymphoproliferative process. Previous
ultrasound-guided biopsy of a RIGHT axillary lymph node was
nonrevealing. Patient is scheduled to meet with Dr. Vanniekerk
(oncologist) next week. Recommend management of axillary lymph nodes
be determined by oncologist. The axillary lymph nodes are amenable
for repeat ultrasound-guided biopsy if clinically desired.

RECOMMENDATION:
Recommend management of axillary lymphadenopathy be determined by
oncologist. Axillary lymph nodes are amenable for ultrasound-guided
biopsy. Recommend sampling of the LEFT axilla if ultrasound-guided
biopsy is reattempted.

I have discussed the findings and recommendations with the patient.
If applicable, a reminder letter will be sent to the patient
regarding the next appointment.

BI-RADS CATEGORY  4: Suspicious.

## 2023-01-11 IMAGING — US US BREAST*L* LIMITED INC AXILLA
1 series · 9 of 9 positions shown · non-contrast
Comparison: Previous exam(s).

CLINICAL DATA: Three-month follow-up of bilateral lymphadenopathy.
Patient was biopsied in [REDACTED] with benign results. However there
has been continued increase in axillary lymphadenopathy and
splenomegaly.

EXAM:
ULTRASOUND OF THE BILATERAL BREAST

[Series 1: us breast*left* limited inc axilla · 0.08mm/px · 9 of 9 slices shown]
[im 1/9]
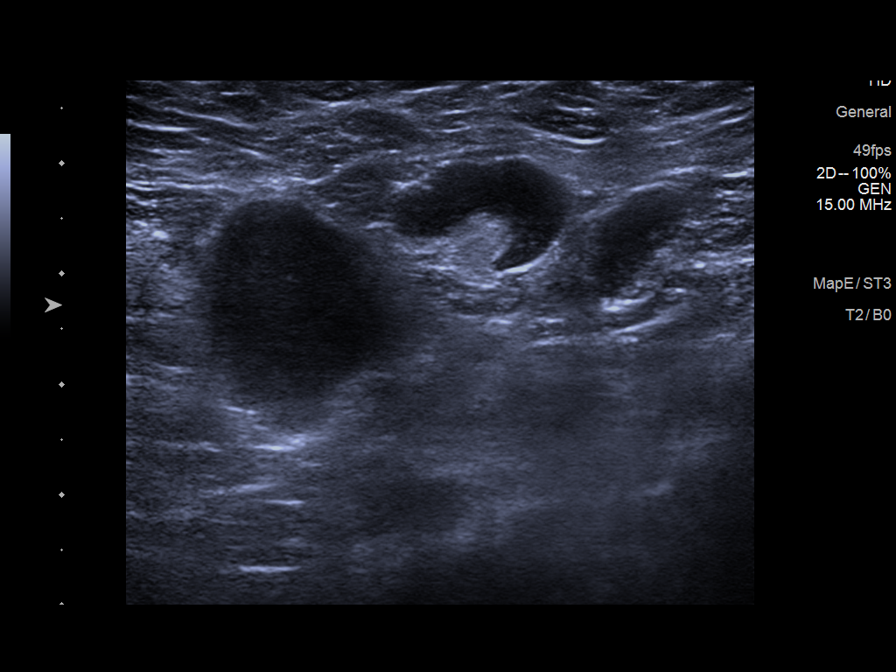
[im 2/9]
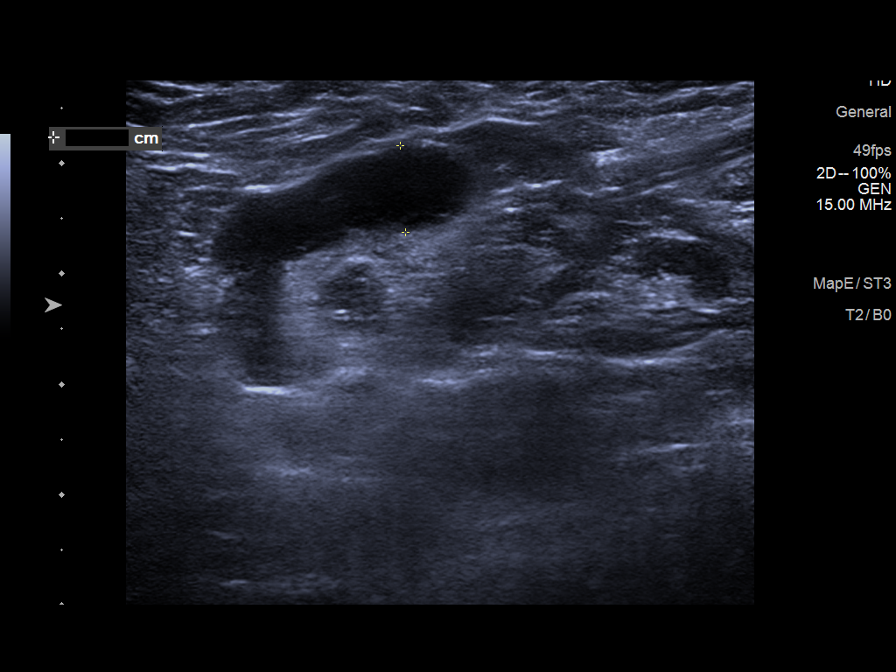
[im 3/9]
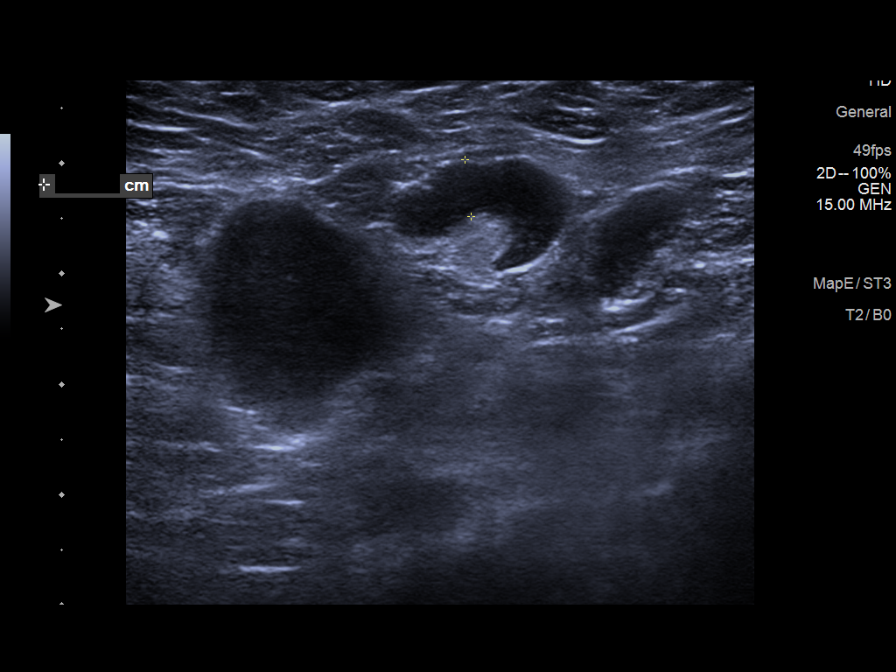
[im 4/9]
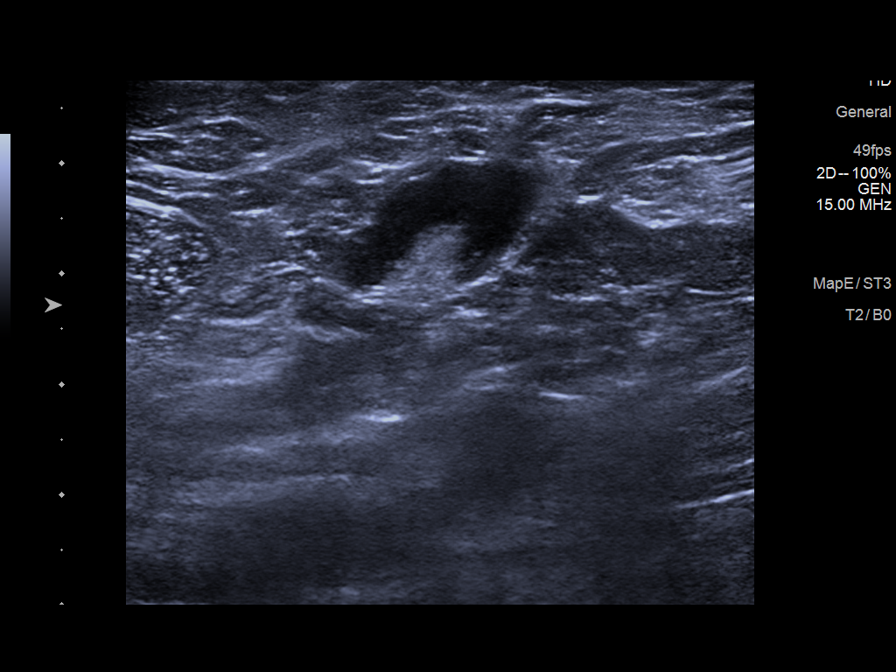
[im 5/9]
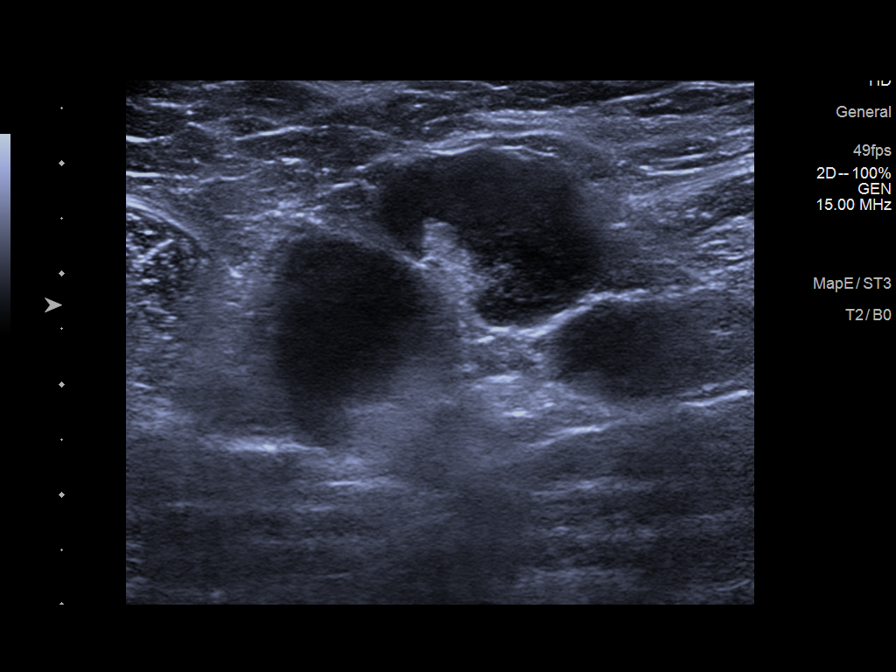
[im 6/9]
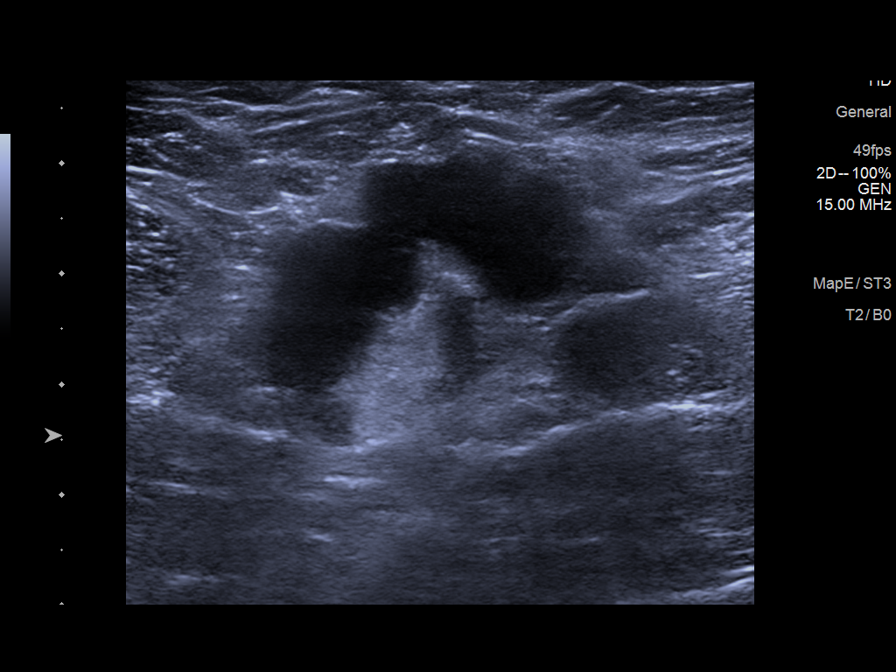
[im 7/9]
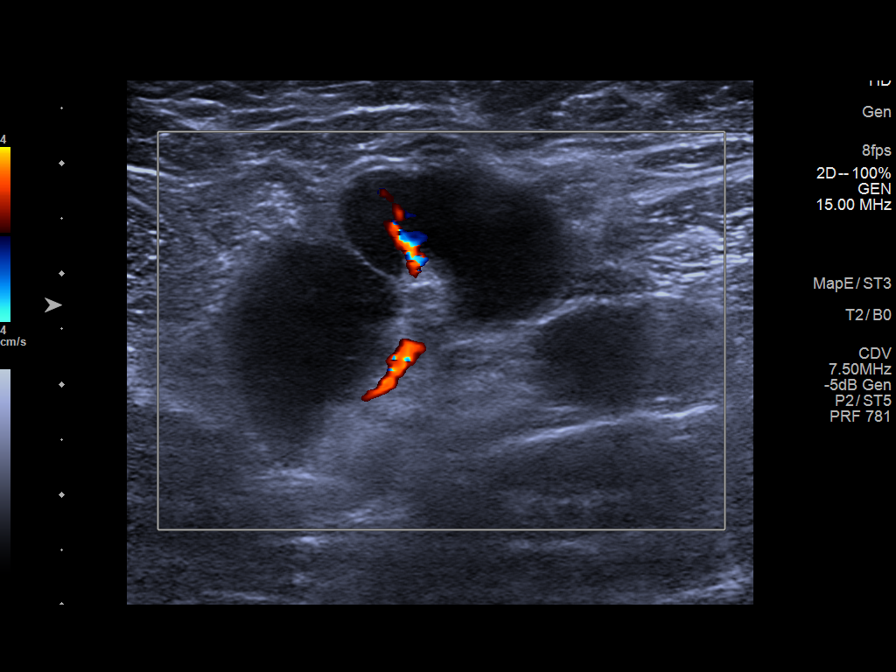
[im 8/9]
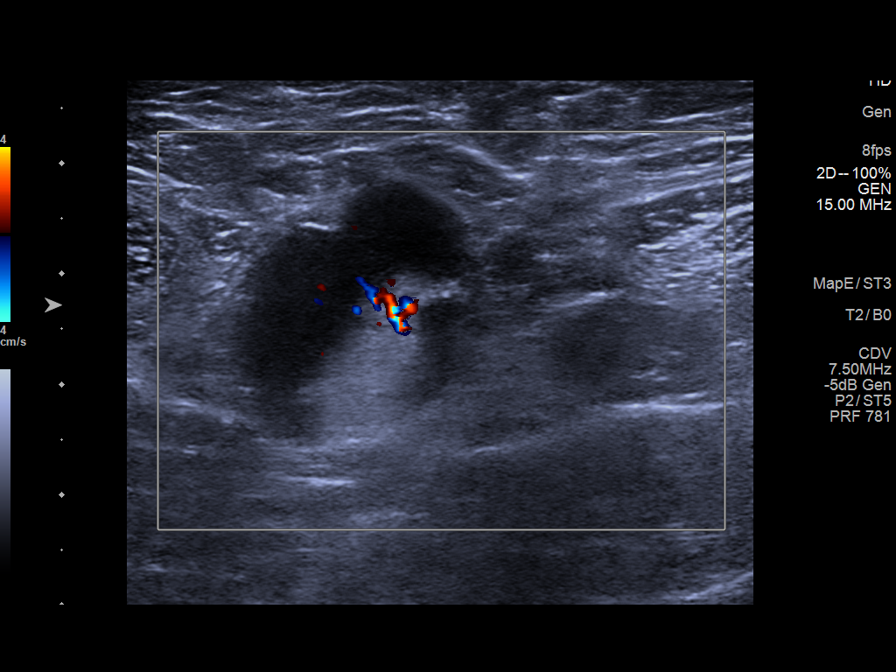
[im 9/9]
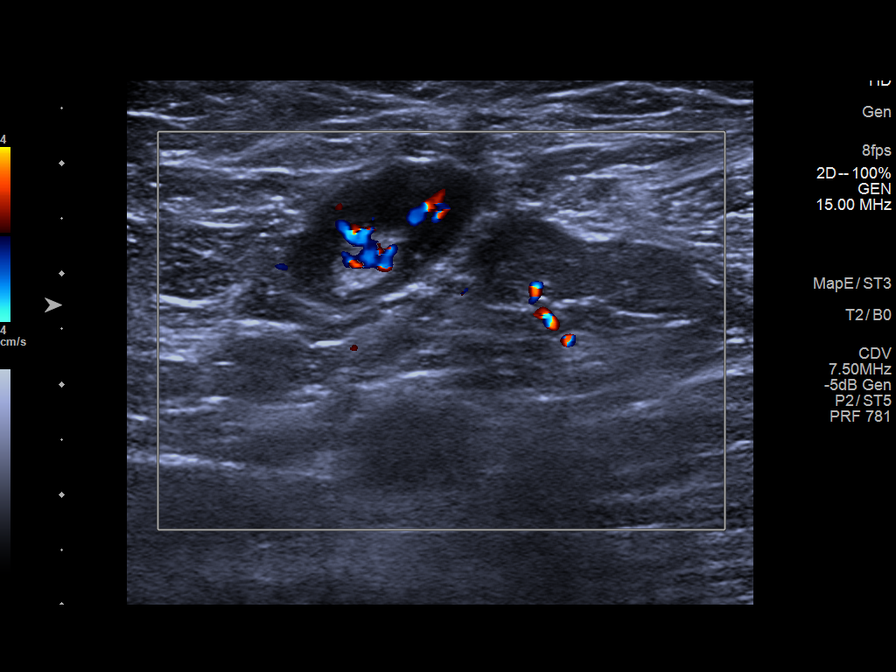

[9 of 9 positions shown; findings below may reference images not displayed]

FINDINGS: Targeted ultrasound was performed of the RIGHT axilla. There are
multiple enlarged RIGHT axillary lymph nodes with diffusely
thickened cortices and benign echogenic hila. Representative
cortical thickness measures up to 8 mm. This is increased in
comparison to prior. Biopsy clip is noted within an axillary lymph
node with a diffusely thickened cortex which was previously biopsied
with benign results.

Targeted ultrasound was performed of the LEFT axilla. There are
multiple enlarged LEFT axillary lymph nodes with diffusely thickened
cortices and benign echogenic hila. Representative cortex measures 8
mm in thickness. This is increased in comparison to prior.
IMPRESSION: 1. There is continued increase in bilateral axillary
lymphadenopathy. Given systemic increase in adenopathy, this remains
concerning for an underlying lymphoproliferative process. Previous
ultrasound-guided biopsy of a RIGHT axillary lymph node was
nonrevealing. Patient is scheduled to meet with Dr. Vanniekerk
(oncologist) next week. Recommend management of axillary lymph nodes
be determined by oncologist. The axillary lymph nodes are amenable
for repeat ultrasound-guided biopsy if clinically desired.

RECOMMENDATION:
Recommend management of axillary lymphadenopathy be determined by
oncologist. Axillary lymph nodes are amenable for ultrasound-guided
biopsy. Recommend sampling of the LEFT axilla if ultrasound-guided
biopsy is reattempted.

I have discussed the findings and recommendations with the patient.
If applicable, a reminder letter will be sent to the patient
regarding the next appointment.

BI-RADS CATEGORY  4: Suspicious.

## 2023-02-15 ENCOUNTER — Other Ambulatory Visit: Payer: Self-pay | Admitting: Physician Assistant

## 2023-02-15 DIAGNOSIS — Z1231 Encounter for screening mammogram for malignant neoplasm of breast: Secondary | ICD-10-CM

## 2023-02-25 ENCOUNTER — Encounter: Payer: Self-pay | Admitting: Oncology

## 2023-02-25 ENCOUNTER — Inpatient Hospital Stay: Payer: Medicare HMO | Attending: Oncology

## 2023-02-25 ENCOUNTER — Inpatient Hospital Stay: Payer: Medicare HMO | Admitting: Oncology

## 2023-02-25 VITALS — BP 134/49 | HR 85 | Temp 97.4°F | Resp 18 | Ht 64.0 in | Wt 170.8 lb

## 2023-02-25 DIAGNOSIS — D696 Thrombocytopenia, unspecified: Secondary | ICD-10-CM | POA: Diagnosis not present

## 2023-02-25 DIAGNOSIS — Z08 Encounter for follow-up examination after completed treatment for malignant neoplasm: Secondary | ICD-10-CM

## 2023-02-25 DIAGNOSIS — C828A Other types of follicular lymphoma, in remission: Secondary | ICD-10-CM | POA: Insufficient documentation

## 2023-02-25 DIAGNOSIS — Z8572 Personal history of non-Hodgkin lymphomas: Secondary | ICD-10-CM | POA: Diagnosis not present

## 2023-02-25 LAB — COMPREHENSIVE METABOLIC PANEL
ALT: 25 U/L (ref 0–44)
AST: 32 U/L (ref 15–41)
Albumin: 4.9 g/dL (ref 3.5–5.0)
Alkaline Phosphatase: 87 U/L (ref 38–126)
Anion gap: 8 (ref 5–15)
BUN: 7 mg/dL — ABNORMAL LOW (ref 8–23)
CO2: 30 mmol/L (ref 22–32)
Calcium: 10 mg/dL (ref 8.9–10.3)
Chloride: 102 mmol/L (ref 98–111)
Creatinine, Ser: 0.83 mg/dL (ref 0.44–1.00)
GFR, Estimated: >60 mL/min (ref >60)
Glucose, Bld: 100 mg/dL — ABNORMAL HIGH (ref 70–99)
Potassium: 3.9 mmol/L (ref 3.5–5.1)
Sodium: 140 mmol/L (ref 135–145)
Total Bilirubin: 0.6 mg/dL (ref 0.3–1.2)
Total Protein: 7.7 g/dL (ref 6.5–8.1)

## 2023-02-25 LAB — CBC WITH DIFFERENTIAL/PLATELET
Abs Immature Granulocytes: 0 10*3/uL (ref 0.00–0.07)
Basophils Absolute: 0 10*3/uL (ref 0.0–0.1)
Basophils Relative: 0 %
Eosinophils Absolute: 0 10*3/uL (ref 0.0–0.5)
Eosinophils Relative: 1 %
HCT: 41.1 % (ref 36.0–46.0)
Hemoglobin: 13.9 g/dL (ref 12.0–15.0)
Lymphocytes Relative: 34 %
Lymphs Abs: 1.6 10*3/uL (ref 0.7–4.0)
MCH: 31.2 pg (ref 26.0–34.0)
MCHC: 33.8 g/dL (ref 30.0–36.0)
MCV: 92.4 fL (ref 80.0–100.0)
Monocytes Absolute: 0.6 10*3/uL (ref 0.1–1.0)
Monocytes Relative: 14 %
Neutro Abs: 2.3 10*3/uL (ref 1.7–7.7)
Neutrophils Relative %: 51 %
Platelets: 69 10*3/uL — ABNORMAL LOW (ref 150–400)
RBC: 4.45 MIL/uL (ref 3.87–5.11)
RDW: 14.3 % (ref 11.5–15.5)
Smear Review: NORMAL
WBC: 4.6 10*3/uL (ref 4.0–10.5)
nRBC: 0 % (ref 0.0–0.2)

## 2023-02-25 LAB — LACTATE DEHYDROGENASE: LDH: 154 U/L (ref 98–192)

## 2023-02-25 NOTE — Progress Notes (Signed)
Hematology/Oncology Consult note [facility]  Telephone:([phone] [phone]) [phone]  Patient Care Team: Patrice Paradise, MD as PCP - General (Physician Assistant) Lemar Livings Merrily Pew, MD as Consulting Physician (General Surgery) End, Cristal Deer, MD as Consulting Physician (Cardiology) Creig Hines, MD as Consulting Physician (Hematology and Oncology)   Name of the patient: Natasha Farrell  [id number]  1945-06-21   Date of visit: 02/25/23  Diagnosis- stage IV follicular lymphoma currently in remission   Chief complaint/ Reason for visit-routine follow-up of follicular lymphoma  Heme/Onc history:  Patient is a 77 year old female now transfer of care from Dr. Merlene Pulling.  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  She had presented with extensive adenopathy and massive splenomegaly.   Right axillary ultrasound guided biopsy on 04/21/2020 revealed fragments of lymph node with reactive follicular hyperplasia. There was no definite evidence of malignancy. Flow cytometry revealed a CD10+ B cell population. Clonality could not be evaluated due to non-specific light chain binding. There was no evidence of a T cell lymphoproliferative disorder.      PET scan on 08/12/2020 revealed widespread adenopathy, visceral involvement and signs of near diffuse, multifocal bony involvement, distribution most c/w lymphoma, Deauville category 5. There was ileal involvement as well. Peritoneal involvement was suggested as well with nodular changes about the peritoneum but without significant FDG uptake. There was paraspinal soft tissue without frank bony destruction. Scattered areas of sclerosis underestimated the degree of bony involvement. Abdominal ascites may be related to anasarca and or lymphatic congestion with marked enlargement of LEFT-sided pleural effusion and mild enlargement of small RIGHT-sided pleural fluid.  Left-sided pleural effusion  has been attributed to follicular lymphoma and she has undergone multiple thoracentesis in the past with the last one that was done in April 2022.     Bone marrow on 08/04/2020 revealed variably cellular bone marrow with trilineage hematopoiesis and involvement by an atypical lymphoid proliferation  The bone marrow core biopsy demonstrated involvement by an atypical lymphoid proliferation with paratrabecular lymphoid aggregates and  patchy infiltration of the bone marrow space with accompanying fibrosis. The infiltrate was composed of mixed B-cells and T-cells. At least a subset of the B-cells co-expressed CD10 and BCL6 without definitive BCL2 expression. Scattered larger CD20-positive larger B-cells were also present. Flow cytometry showed no definitive atypical B-cell population. No immunophenotypically aberrant T-cell population was identified. There were no increase in blasts. Monocytes showed heterogeneous CD56 expression.  Cytogenetics were normal (46, XX).   Patient had complete response to therapy after 6 cycles of R-CHOP ending in July 2022 and she remains in CR 1 since then.Scans after 6 cycles of R-CHOP chemotherapy showed decrease in the size of adenopathy both above and below the diaphragm with significant reduction/resolution in the abnormal hypermetabolic activity.  There was persistent low-level hypermetabolic activity noted in the abdominal and pelvic lymph nodes with an SUV between 1-3 but minimally about the background mediastinal blood pool activity.  Deauville 2/3   Patient also went for second opinion to [facility] and overall decision was made against maintenance Rituxan.  Interval history-patient is doing well at this time.  Appetite and weight have remained stable.  Denies any new aches and pains anywhere.  Denies any swelling or lumps or bumps anywhere.  ECOG PS- 0 Pain scale- 0   Review of systems- Review of Systems  Constitutional:  Negative for chills, fever, malaise/fatigue and  weight loss.  HENT:  Negative for congestion, ear discharge and nosebleeds.  Eyes:  Negative for blurred vision.  Respiratory:  Negative for cough, hemoptysis, sputum production, shortness of breath and wheezing.   Cardiovascular:  Negative for chest pain, palpitations, orthopnea and claudication.  Gastrointestinal:  Negative for abdominal pain, blood in stool, constipation, diarrhea, heartburn, melena, nausea and vomiting.  Genitourinary:  Negative for dysuria, flank pain, frequency, hematuria and urgency.  Musculoskeletal:  Negative for back pain, joint pain and myalgias.  Skin:  Negative for rash.  Neurological:  Negative for dizziness, tingling, focal weakness, seizures, weakness and headaches.  Endo/Heme/Allergies:  Does not bruise/bleed easily.  Psychiatric/Behavioral:  Negative for depression and suicidal ideas. The patient does not have insomnia.       Allergies  Allergen Reactions   Lisinopril Swelling    angioedema     Past Medical History:  Diagnosis Date   Arthritis    Follicular lymphoma (HCC) 2022   Hx of hysterectomy    Hyperlipidemia    Hypertension    Thyroid disease    rt thryroidectomy  09/25/2010   Wears dentures    partial upper and lower     Past Surgical History:  Procedure Laterality Date   ABDOMINAL HYSTERECTOMY     BREAST BIOPSY Right 04/21/2020   hydromark 3 [place] bx FRAGMENTS OF LYMPH NODE WITH REACTIVE FOLLICULARHYPERPLASIA of the RIGHT axilla. There is no evidence of a T cell lymphoproliferative disorder   CATARACT EXTRACTION W/PHACO Right 05/25/2022   Procedure: CATARACT EXTRACTION PHACO AND INTRAOCULAR LENS PLACEMENT (IOC) RIGHT;  Surgeon: Galen Manila, MD;  Location: [facility];  Service: Ophthalmology;  Laterality: Right;  6.39 0:38.5   CATARACT EXTRACTION W/PHACO Left 06/08/2022   Procedure: CATARACT EXTRACTION PHACO AND INTRAOCULAR LENS PLACEMENT (IOC) LEFT  6.05  00:45.4;  Surgeon: Galen Manila, MD;  Location: [facility];  Service: Ophthalmology;  Laterality: Left;   LYMPH NODE BIOPSY Left 08/01/2020   Procedure: LYMPH NODE BIOPSY;  Surgeon: Earline Mayotte, MD;  Location: [facility];  Service: General;  Laterality: Left;   PORTACATH PLACEMENT Left 08/22/2020   Procedure: INSERTION PORT-A-CATH;  Surgeon: Earline Mayotte, MD;  Location: [facility];  Service: General;  Laterality: Left;    Social History   Socioeconomic History   Marital status: Married    Spouse name: Not on file   Number of children: Not on file   Years of education: Not on file   Highest education level: Not on file  Occupational History   Not on file  Tobacco Use   Smoking status: Never   Smokeless tobacco: Never  Vaping Use   Vaping status: Never Used  Substance and Sexual Activity   Alcohol use: No   Drug use: No   Sexual activity: Not on file  Other Topics Concern   Not on file  Social History Narrative   Not on file   Social Determinants of Health   Financial Resource Strain: Low Risk  (02/24/2023)   Received from [facility]   Overall Financial Resource Strain (CARDIA)    Difficulty of Paying Living Expenses: Not hard at all  Food Insecurity: No Food Insecurity (02/24/2023)   [facility] from [facility]   Hunger Vital Sign    Worried About Running Out of Food in the Last Year: Never true    Ran Out of Food in the Last Year: Never true  Transportation Needs: No Transportation Needs (02/24/2023)   Received from [facility]   [facility] - Transportation  In the past 12 months, has lack of transportation kept you from medical appointments or from getting medications?: No    Lack of Transportation (Non-Medical): No  Physical Activity: Not on file  Stress: Not on file  Social Connections: Unknown (02/07/2019)   [facility] from [facility], [facility]   Social Connection and Isolation Panel [NHANES]    Frequency of  Communication with Friends and Family: Not on file    Frequency of Social Gatherings with Friends and Family: Not on file    Attends Religious Services: Not on file    Active Member of Clubs or Organizations: Not on file    Attends Banker Meetings: Not on file    Marital Status: Married  Catering manager Violence: Not on file    Family History  Problem Relation Age of Onset   Prostate cancer Father    Breast cancer Sister 3   Colon cancer Sister    Kidney cancer Sister    Breast cancer Sister      Current Outpatient Medications:    acetaminophen (TYLENOL) 500 MG tablet, Take 500 mg by mouth every 6 (six) hours as needed for moderate pain, fever or headache., Disp: , Rfl:    carvedilol (COREG) 25 MG tablet, TAKE 1 TABLET BY MOUTH TWICE A DAY, Disp: 180 tablet, Rfl: 1   Cholecalciferol (VITAMIN D3) 50 MCG (2000 UT) TABS, Take by mouth daily., Disp: , Rfl:    cyanocobalamin 1000 MCG tablet, Take 1,000 mcg by mouth daily., Disp: , Rfl:    levothyroxine (SYNTHROID) 50 MCG tablet, Take 50 mcg by mouth daily before breakfast., Disp: , Rfl:    loratadine (CLARITIN) 10 MG tablet, Take 10 mg by mouth daily as needed for allergies., Disp: , Rfl:    Multiple Vitamin (MULTIVITAMIN WITH MINERALS) TABS tablet, Take 1 tablet by mouth daily., Disp: , Rfl:    pravastatin (PRAVACHOL) 40 MG tablet, Take 40 mg by mouth daily., Disp: , Rfl:    spironolactone (ALDACTONE) 50 MG tablet, Take 0.5 tablet (25 mg) by mouth once daily , Disp: , Rfl:    COLLAGEN PO, Take by mouth daily. (Patient not taking: Reported on 02/25/2023), Disp: , Rfl:    lidocaine-prilocaine (EMLA) cream, Apply 1 Application topically as needed. Apply small amount to port site at least 1 hour prior to it being accessed, cover with plastic wrap (Patient not taking: Reported on 02/25/2023), Disp: 30 g, Rfl: 2 No current facility-administered medications for this visit.  Facility-Administered Medications Ordered in Other  Visits:    sodium chloride flush (NS) 0.9 % injection 10 mL, 10 mL, Intracatheter, PRN, Creig Hines, MD, 10 mL at 12/10/20 0830  Physical exam:  Vitals:   02/25/23 1028  BP: (!) 134/49  Pulse: 85  Resp: 18  Temp: (!) 97.4 F (36.3 C)  TempSrc: Tympanic  SpO2: 98%  Weight: 170 lb 12.8 oz (77.5 kg)  Height: 5\' 4"  (1.626 m)   Physical Exam Cardiovascular:     Rate and Rhythm: Normal rate and regular rhythm.     Heart sounds: Normal heart sounds.  Pulmonary:     Effort: Pulmonary effort is normal.     Breath sounds: Normal breath sounds.  Abdominal:     General: Bowel sounds are normal. There is no distension.     Palpations: Abdomen is soft.     Tenderness: There is no abdominal tenderness.     Comments: No palpable hepatosplenomegaly  Lymphadenopathy:  Comments: No palpable cervical, supraclavicular, axillary or inguinal adenopathy    Skin:    General: Skin is warm and dry.  Neurological:     Mental Status: She is alert and oriented to person, place, and time.         Latest Ref Rng & Units 02/25/2023   10:09 AM  CMP  Glucose 70 - 99 mg/dL 147   BUN 8 - 23 mg/dL 7   Creatinine 8.29 - 5.62 mg/dL 1.30   Sodium 865 - 784 mmol/L 140   Potassium 3.5 - 5.1 mmol/L 3.9   Chloride 98 - 111 mmol/L 102   CO2 22 - 32 mmol/L 30   Calcium 8.9 - 10.3 mg/dL 69.6   Total Protein 6.5 - 8.1 g/dL 7.7   Total Bilirubin 0.3 - 1.2 mg/dL 0.6   Alkaline Phos 38 - 126 U/L 87   AST 15 - 41 U/L 32   ALT 0 - 44 U/L 25       Latest Ref Rng & Units 02/25/2023   10:09 AM  CBC  WBC 4.0 - 10.5 K/uL 4.6   Hemoglobin 12.0 - 15.0 g/dL 29.5   Hematocrit 28.4 - 46.0 % 41.1   Platelets 150 - 400 K/uL 69     Assessment and plan- Patient is a 77 y.o. female with stage IV follicular lymphoma s/p 6 cycles of R-CHOP currently in CR1.   Clinically patient is doing well with no concerning signs and symptoms of recurrence based on today's exam.  She has chronic thrombocytopenia with a platelet  count has fluctuated between 60s 200s over the last 2 years.  White count and hemoglobin have remained normal.  Delay thrombocytopenia can be seen from prior Rituxan therapy which I will continue to monitor at this time.  I will see her back in 6 months with scans prior   Visit Diagnosis 1. Encounter for follow-up surveillance of lymphoma      Dr. Owens Shark, MD, MPH [facility] [phone] 02/25/2023 12:40 PM

## 2023-03-10 ENCOUNTER — Encounter: Payer: Self-pay | Admitting: Internal Medicine

## 2023-03-10 ENCOUNTER — Ambulatory Visit: Payer: Medicare HMO | Attending: Internal Medicine | Admitting: Internal Medicine

## 2023-03-10 VITALS — BP 132/64 | HR 74 | Ht 64.0 in | Wt 177.0 lb

## 2023-03-10 DIAGNOSIS — R42 Dizziness and giddiness: Secondary | ICD-10-CM

## 2023-03-10 DIAGNOSIS — R002 Palpitations: Secondary | ICD-10-CM

## 2023-03-10 DIAGNOSIS — E785 Hyperlipidemia, unspecified: Secondary | ICD-10-CM

## 2023-03-10 DIAGNOSIS — R000 Tachycardia, unspecified: Secondary | ICD-10-CM

## 2023-03-10 DIAGNOSIS — I5032 Chronic diastolic (congestive) heart failure: Secondary | ICD-10-CM | POA: Diagnosis not present

## 2023-03-10 DIAGNOSIS — I700 Atherosclerosis of aorta: Secondary | ICD-10-CM

## 2023-03-10 DIAGNOSIS — I10 Essential (primary) hypertension: Secondary | ICD-10-CM | POA: Diagnosis not present

## 2023-03-10 NOTE — Progress Notes (Signed)
Cardiology Office Note:  .   Date:  03/10/2023  ID:  Natasha Farrell, DOB 09-19-1945, MRN [id number] PCP: Patrice Paradise, MD  [facility] Providers Cardiologist:  None     History of Present Illness: .   Discussed the use of AI scribe software for clinical note transcription with the patient, who gave verbal consent to proceed.  Natasha Farrell is a 77 y.o. female with history of sinus tachycardia, hypertension, hyperlipidemia, follicular lymphoma, and thyroid disease status post right thyroidectomy (2012), who presents for follow-up of tachycardia and hypertension.  I last saw her a year ago, at which time she was feeling well.  She was frustrated by inability to lose weight despite walking on a regular basis.  We did not make any medication changes or pursue additional testing.  Today, [name] reports that she has been experiencing some intermittent heart fluttering, but it is not significantly different from her baseline. She denies chest pain, shortness of breath, and leg swelling. However, she has been experiencing episodes of lightheadedness, particularly upon sudden standing. These episodes are brief, lasting only a few seconds, but have been increasing in frequency. The patient has not fallen or lost consciousness due to these episodes. In addition to her cardiac concerns, the patient has been dealing with dental issues. She recently had her dental implants removed and is struggling to adjust to dentures, particularly the lower set, which rubs on a sore spot and does not fit tightly.  The patient also recently experienced a significant personal loss, with the passing of her older sister due to metastatic breast cancer. This has been a source of emotional stress for the patient.     ROS: See HPI  Studies Reviewed: Marland Kitchen   EKG Interpretation Date/Time:  Thursday March 10 2023 08:31:23 EDT Ventricular Rate:  74 PR Interval:  136 QRS Duration:  74 QT Interval:  396 QTC  Calculation: 439 R Axis:   20  Text Interpretation: Normal sinus rhythm Normal ECG When compared with ECG of 17-Feb-2022 No significant change was found Confirmed by Tasmine Hipwell, Cristal Deer [phone]) on 03/10/2023 8:49:27 AM    Risk Assessment/Calculations:             Physical Exam:   VS:  BP 132/64 (BP Location: Left Arm, Patient Position: Sitting)   Pulse 74   Ht 5\' 4"  (1.626 m)   Wt 177 lb (80.3 kg)   SpO2 96%   BMI 30.38 kg/m    Wt Readings from Last 3 Encounters:  03/10/23 177 lb (80.3 kg)  02/25/23 170 lb 12.8 oz (77.5 kg)  08/25/22 180 lb (81.6 kg)    General:  NAD. Neck: No JVD or HJR. Lungs: Clear to auscultation bilaterally without wheezes or crackles. Heart: Regular rate and rhythm with 2/6 systolic murmur.  No rubs or gallops. Abdomen: Soft, nontender, nondistended. Extremities: No lower extremity edema.  ASSESSMENT AND PLAN: .    Orthostatic lightheadedness: Episodes are brief and self-limited.  No falls or syncope reported.  Blood pressure upper normal today.  We discussed de-escalation of carvedilol but have agreed to defer this given symptomatic improvement in regard to palpitations/tachycardia.  I have encouraged [name] to remain well-hydrated and to get up slowly to minimize risk for syncope/falls.  Palpitations/sinus tachycardia: Heart rate normal today.  Symptoms well-controlled with carvedilol.  Continue current medications.  Hypertension: Blood pressure borderline elevated today.  Defer medication changes.  Hyperlipidemia and aortic atherosclerosis: Continue pravastatin 40 mg daily under the direction  of Dr. Merlinda Frederick.    Dispo: Return to clinic in 1 year.  Signed, Yvonne Kendall, MD

## 2023-03-10 NOTE — Patient Instructions (Signed)
Medication Instructions:  Your physician recommends that you continue on your current medications as directed. Please refer to the Current Medication list given to you today.   *If you need a refill on your cardiac medications before your next appointment, please call your pharmacy*   Lab Work: No labs ordered today    Testing/Procedures: No test ordered today    Follow-Up: At [facility], you and your health needs are our priority.  As part of our continuing mission to provide you with exceptional heart care, we have created designated Provider Care Teams.  These Care Teams include your primary Cardiologist (physician) and Advanced Practice Providers (APPs -  Physician Assistants and Nurse Practitioners) who all work together to provide you with the care you need, when you need it.  We recommend signing up for the patient portal called "MyChart".  Sign up information is provided on this After Visit Summary.  MyChart is used to connect with patients for Virtual Visits (Telemedicine).  Patients are able to view lab/test results, encounter notes, upcoming appointments, etc.  Non-urgent messages can be sent to your provider as well.   To learn more about what you can do with MyChart, go to [email or web address].    Your next appointment:   1 year(s)  Provider:   You may see Yvonne Kendall, MD or one of the following Advanced Practice Providers on your designated Care Team:   Nicolasa Ducking, NP Eula Listen, PA-C Cadence Fransico Michael, PA-C Charlsie Quest, NP

## 2023-03-11 ENCOUNTER — Encounter: Payer: Self-pay | Admitting: Internal Medicine

## 2023-03-31 ENCOUNTER — Other Ambulatory Visit: Payer: Self-pay | Admitting: Internal Medicine

## 2023-04-04 ENCOUNTER — Ambulatory Visit
Admission: RE | Admit: 2023-04-04 | Discharge: 2023-04-04 | Disposition: A | Discharge status: Home / Self Care | Payer: Medicare HMO | Source: Ambulatory Visit | Attending: Physician Assistant | Admitting: Physician Assistant

## 2023-04-04 DIAGNOSIS — Z1231 Encounter for screening mammogram for malignant neoplasm of breast: Secondary | ICD-10-CM | POA: Insufficient documentation

## 2023-05-26 NOTE — Telephone Encounter (Signed)
 Created in error

## 2023-08-15 ENCOUNTER — Ambulatory Visit
Admission: RE | Admit: 2023-08-15 | Discharge: 2023-08-15 | Disposition: A | Discharge status: Home / Self Care | Payer: Medicare HMO | Source: Ambulatory Visit | Attending: Oncology | Admitting: Oncology

## 2023-08-15 DIAGNOSIS — Z08 Encounter for follow-up examination after completed treatment for malignant neoplasm: Secondary | ICD-10-CM | POA: Insufficient documentation

## 2023-08-15 DIAGNOSIS — Z8572 Personal history of non-Hodgkin lymphomas: Secondary | ICD-10-CM | POA: Diagnosis present

## 2023-08-15 MED ORDER — IOHEXOL 300 MG/ML  SOLN
100.0000 mL | Freq: Once | INTRAMUSCULAR | Status: AC | PRN
  Administered 2023-08-15: 100 mL via INTRAVENOUS

## 2023-08-26 ENCOUNTER — Inpatient Hospital Stay: Payer: Medicare HMO | Admitting: Oncology

## 2023-08-26 ENCOUNTER — Encounter: Payer: Self-pay | Admitting: Oncology

## 2023-08-26 ENCOUNTER — Inpatient Hospital Stay: Payer: Medicare HMO | Attending: Oncology

## 2023-08-26 VITALS — BP 135/52 | HR 70 | Temp 96.7°F | Resp 19 | Ht 64.0 in | Wt 180.9 lb

## 2023-08-26 DIAGNOSIS — D696 Thrombocytopenia, unspecified: Secondary | ICD-10-CM

## 2023-08-26 DIAGNOSIS — Z08 Encounter for follow-up examination after completed treatment for malignant neoplasm: Secondary | ICD-10-CM | POA: Diagnosis not present

## 2023-08-26 DIAGNOSIS — Z9221 Personal history of antineoplastic chemotherapy: Secondary | ICD-10-CM | POA: Insufficient documentation

## 2023-08-26 DIAGNOSIS — Z8572 Personal history of non-Hodgkin lymphomas: Secondary | ICD-10-CM

## 2023-08-26 LAB — CBC WITH DIFFERENTIAL/PLATELET
Abs Immature Granulocytes: 0.06 10*3/uL (ref 0.00–0.07)
Basophils Absolute: 0.1 10*3/uL (ref 0.0–0.1)
Basophils Relative: 1 %
Eosinophils Absolute: 0.1 10*3/uL (ref 0.0–0.5)
Eosinophils Relative: 1 %
HCT: 40.6 % (ref 36.0–46.0)
Hemoglobin: 13.7 g/dL (ref 12.0–15.0)
Immature Granulocytes: 1 %
Lymphocytes Relative: 25 %
Lymphs Abs: 1.5 10*3/uL (ref 0.7–4.0)
MCH: 31.9 pg (ref 26.0–34.0)
MCHC: 33.7 g/dL (ref 30.0–36.0)
MCV: 94.6 fL (ref 80.0–100.0)
Monocytes Absolute: 1.4 10*3/uL — ABNORMAL HIGH (ref 0.1–1.0)
Monocytes Relative: 22 %
Neutro Abs: 3.1 10*3/uL (ref 1.7–7.7)
Neutrophils Relative %: 50 %
Platelets: 62 10*3/uL — ABNORMAL LOW (ref 150–400)
RBC: 4.29 MIL/uL (ref 3.87–5.11)
RDW: 14 % (ref 11.5–15.5)
WBC: 6.2 10*3/uL (ref 4.0–10.5)
nRBC: 0 % (ref 0.0–0.2)

## 2023-08-26 LAB — COMPREHENSIVE METABOLIC PANEL WITH GFR
ALT: 21 U/L (ref 0–44)
AST: 37 U/L (ref 15–41)
Albumin: 4.8 g/dL (ref 3.5–5.0)
Alkaline Phosphatase: 81 U/L (ref 38–126)
Anion gap: 9 (ref 5–15)
BUN: 11 mg/dL (ref 8–23)
CO2: 28 mmol/L (ref 22–32)
Calcium: 9.7 mg/dL (ref 8.9–10.3)
Chloride: 101 mmol/L (ref 98–111)
Creatinine, Ser: 0.74 mg/dL (ref 0.44–1.00)
GFR, Estimated: >60 mL/min (ref >60)
Glucose, Bld: 95 mg/dL (ref 70–99)
Potassium: 3.9 mmol/L (ref 3.5–5.1)
Sodium: 138 mmol/L (ref 135–145)
Total Bilirubin: 0.7 mg/dL (ref 0.0–1.2)
Total Protein: 7.3 g/dL (ref 6.5–8.1)

## 2023-08-26 LAB — LACTATE DEHYDROGENASE: LDH: 151 U/L (ref 98–192)

## 2023-08-26 NOTE — Progress Notes (Signed)
 Hematology/Oncology Consult note [facility]  Telephone:([phone] [phone]  Patient Care Team: Patrice Paradise, MD as PCP - General (Physician Assistant) End, Cristal Deer, MD as PCP - Cardiology (Cardiology) Lemar Livings Merrily Pew, MD as Consulting Physician (General Surgery) End, Cristal Deer, MD as Consulting Physician (Cardiology) Creig Hines, MD as Consulting Physician (Hematology and Oncology)   Name of the patient: Mylan Lengyel  [id number]  27-Jan-1946   Date of visit: 08/26/23  Diagnosis- stage IV follicular lymphoma currently in remission   Chief complaint/ Reason for visit-routine follow-up of follicular lymphoma  Heme/Onc history: Patient is a 78 year old female now transfer of care from Dr. Merlene Pulling.  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  She had presented with extensive adenopathy and massive splenomegaly.   Right axillary ultrasound guided biopsy on 04/21/2020 revealed fragments of lymph node with reactive follicular hyperplasia. There was no definite evidence of malignancy. Flow cytometry revealed a CD10+ B cell population. Clonality could not be evaluated due to non-specific light chain binding. There was no evidence of a T cell lymphoproliferative disorder.      PET scan on 08/12/2020 revealed widespread adenopathy, visceral involvement and signs of near diffuse, multifocal bony involvement, distribution most c/w lymphoma, Deauville category 5. There was ileal involvement as well. Peritoneal involvement was suggested as well with nodular changes about the peritoneum but without significant FDG uptake. There was paraspinal soft tissue without frank bony destruction. Scattered areas of sclerosis underestimated the degree of bony involvement. Abdominal ascites may be related to anasarca and or lymphatic congestion with marked enlargement of LEFT-sided pleural effusion and mild enlargement of small  RIGHT-sided pleural fluid.  Left-sided pleural effusion has been attributed to follicular lymphoma and she has undergone multiple thoracentesis in the past with the last one that was done in April 2022.     Bone marrow on 08/04/2020 revealed variably cellular bone marrow with trilineage hematopoiesis and involvement by an atypical lymphoid proliferation  The bone marrow core biopsy demonstrated involvement by an atypical lymphoid proliferation with paratrabecular lymphoid aggregates and  patchy infiltration of the bone marrow space with accompanying fibrosis. The infiltrate was composed of mixed B-cells and T-cells. At least a subset of the B-cells co-expressed CD10 and BCL6 without definitive BCL2 expression. Scattered larger CD20-positive larger B-cells were also present. Flow cytometry showed no definitive atypical B-cell population. No immunophenotypically aberrant T-cell population was identified. There were no increase in blasts. Monocytes showed heterogeneous CD56 expression.  Cytogenetics were normal (46, XX).   Patient had complete response to therapy after 6 cycles of R-CHOP ending in July 2022 and she remains in CR 1 since then.Scans after 6 cycles of R-CHOP chemotherapy showed decrease in the size of adenopathy both above and below the diaphragm with significant reduction/resolution in the abnormal hypermetabolic activity.  There was persistent low-level hypermetabolic activity noted in the abdominal and pelvic lymph nodes with an SUV between 1-3 but minimally about the background mediastinal blood pool activity.  Deauville 2/3   Patient also went for second opinion to [facility] and overall decision was made against maintenance Rituxan.  Interval history-she is doing well presently and denies any complaints at this time.  Appetite and weight have remained stable.  Denies any significant fatigue  ECOG PS- 0 Pain scale- 0   Review of systems- Review of Systems  Constitutional:  Negative for  chills, fever, malaise/fatigue and weight loss.  HENT:  Negative for congestion, ear discharge and nosebleeds.  Eyes:  Negative for blurred vision.  Respiratory:  Negative for cough, hemoptysis, sputum production, shortness of breath and wheezing.   Cardiovascular:  Negative for chest pain, palpitations, orthopnea and claudication.  Gastrointestinal:  Negative for abdominal pain, blood in stool, constipation, diarrhea, heartburn, melena, nausea and vomiting.  Genitourinary:  Negative for dysuria, flank pain, frequency, hematuria and urgency.  Musculoskeletal:  Negative for back pain, joint pain and myalgias.  Skin:  Negative for rash.  Neurological:  Negative for dizziness, tingling, focal weakness, seizures, weakness and headaches.  Endo/Heme/Allergies:  Does not bruise/bleed easily.  Psychiatric/Behavioral:  Negative for depression and suicidal ideas. The patient does not have insomnia.       Allergies  Allergen Reactions   Lisinopril Swelling    angioedema     Past Medical History:  Diagnosis Date   Arthritis    Follicular lymphoma (HCC) 2022   Hx of hysterectomy    Hyperlipidemia    Hypertension    Thyroid disease    rt thryroidectomy  09/25/2010   Wears dentures    partial upper and lower     Past Surgical History:  Procedure Laterality Date   ABDOMINAL HYSTERECTOMY     BREAST BIOPSY Right 04/21/2020   hydromark 3 [place] bx FRAGMENTS OF LYMPH NODE WITH REACTIVE FOLLICULARHYPERPLASIA of the RIGHT axilla. There is no evidence of a T cell lymphoproliferative disorder   CATARACT EXTRACTION W/PHACO Right 05/25/2022   Procedure: CATARACT EXTRACTION PHACO AND INTRAOCULAR LENS PLACEMENT (IOC) RIGHT;  Surgeon: Galen Manila, MD;  Location: [facility];  Service: Ophthalmology;  Laterality: Right;  6.39 0:38.5   CATARACT EXTRACTION W/PHACO Left 06/08/2022   Procedure: CATARACT EXTRACTION PHACO AND INTRAOCULAR LENS PLACEMENT (IOC) LEFT  6.05  00:45.4;  Surgeon: Galen Manila, MD;  Location: [facility];  Service: Ophthalmology;  Laterality: Left;   LYMPH NODE BIOPSY Left 08/01/2020   Procedure: LYMPH NODE BIOPSY;  Surgeon: Earline Mayotte, MD;  Location: [facility];  Service: General;  Laterality: Left;   PORTACATH PLACEMENT Left 08/22/2020   Procedure: INSERTION PORT-A-CATH;  Surgeon: Earline Mayotte, MD;  Location: [facility];  Service: General;  Laterality: Left;    Social History   Socioeconomic History   Marital status: Married    Spouse name: Not on file   Number of children: Not on file   Years of education: Not on file   Highest education level: Not on file  Occupational History   Not on file  Tobacco Use   Smoking status: Never   Smokeless tobacco: Never  Vaping Use   Vaping status: Never Used  Substance and Sexual Activity   Alcohol use: No   Drug use: No   Sexual activity: Not on file  Other Topics Concern   Not on file  Social History Narrative   Not on file   Social Drivers of Health   Financial Resource Strain: Low Risk  (08/23/2023)   Received [facility]   Overall Financial Resource Strain (CARDIA)    Difficulty of Paying Living Expenses: Not hard at all  Food Insecurity: No Food Insecurity (08/23/2023)   [facility]   Hunger Vital Sign    Worried About Running Out of Food in the Last Year: Never true    Ran Out of Food in the Last Year: Never true  Transportation Needs: No Transportation Needs (08/23/2023)   Received from [facility]   [facility] - Transportation  In the past 12 months, has lack of transportation kept you from medical appointments or from getting medications?: No    Lack of Transportation (Non-Medical): No  Physical Activity: Not on file  Stress: Not on file  Social Connections: Unknown (02/07/2019)   Received from [facility]   Social Connection and Isolation Panel  [NHANES]    Frequency of Communication with Friends and Family: Not on file    Frequency of Social Gatherings with Friends and Family: Not on file    Attends Religious Services: Not on file    Active Member of Clubs or Organizations: Not on file    Attends Banker Meetings: Not on file    Marital Status: Married  Catering manager Violence: Not on file    Family History  Problem Relation Age of Onset   Prostate cancer Father    Breast cancer Sister 73   Colon cancer Sister    Kidney cancer Sister    Breast cancer Sister      Current Outpatient Medications:    acetaminophen (TYLENOL) 500 MG tablet, Take 500 mg by mouth every 6 (six) hours as needed for moderate pain, fever or headache., Disp: , Rfl:    carvedilol (COREG) 25 MG tablet, TAKE 1 TABLET BY MOUTH TWICE A DAY, Disp: 180 tablet, Rfl: 2   Cholecalciferol (VITAMIN D3) 50 MCG (2000 UT) TABS, Take by mouth daily., Disp: , Rfl:    cyanocobalamin 1000 MCG tablet, Take 1,000 mcg by mouth daily., Disp: , Rfl:    levothyroxine (SYNTHROID) 50 MCG tablet, Take 50 mcg by mouth daily before breakfast., Disp: , Rfl:    loratadine (CLARITIN) 10 MG tablet, Take 10 mg by mouth daily as needed for allergies., Disp: , Rfl:    Multiple Vitamin (MULTIVITAMIN WITH MINERALS) TABS tablet, Take 1 tablet by mouth daily., Disp: , Rfl:    pravastatin (PRAVACHOL) 40 MG tablet, Take 40 mg by mouth daily., Disp: , Rfl:    spironolactone (ALDACTONE) 50 MG tablet, Take 0.5 tablet (25 mg) by mouth once daily , Disp: , Rfl:  No current facility-administered medications for this visit.  Facility-Administered Medications Ordered in Other Visits:    sodium chloride flush (NS) 0.9 % injection 10 mL, 10 mL, Intracatheter, PRN, Creig Hines, MD, 10 mL at 12/10/20 0830  Physical exam:  Vitals:   08/26/23 0934  BP: (!) 135/52  Pulse: 70  Resp: 19  Temp: (!) 96.7 F (35.9 C)  TempSrc: Tympanic  SpO2: 98%  Weight: 180 lb 14.4 oz (82.1 kg)   Height: 5\' 4"  (1.626 m)   Physical Exam Cardiovascular:     Rate and Rhythm: Normal rate and regular rhythm.     Heart sounds: Normal heart sounds.  Pulmonary:     Effort: Pulmonary effort is normal.     Breath sounds: Normal breath sounds.  Abdominal:     General: Bowel sounds are normal.     Palpations: Abdomen is soft.  Skin:    General: Skin is warm and dry.  Neurological:     Mental Status: She is alert and oriented to person, place, and time.      I have personally reviewed labs listed below:    Latest Ref Rng & Units 08/26/2023    9:23 AM  CMP  Glucose 70 - 99 mg/dL 95   BUN 8 - 23 mg/dL 11   Creatinine 1.91 - 1.00 mg/dL 4.78   Sodium 295 -  145 mmol/L 138   Potassium 3.5 - 5.1 mmol/L 3.9   Chloride 98 - 111 mmol/L 101   CO2 22 - 32 mmol/L 28   Calcium 8.9 - 10.3 mg/dL 9.7   Total Protein 6.5 - 8.1 g/dL 7.3   Total Bilirubin 0.0 - 1.2 mg/dL 0.7   Alkaline Phos 38 - 126 U/L 81   AST 15 - 41 U/L 37   ALT 0 - 44 U/L 21       Latest Ref Rng & Units 08/26/2023    9:23 AM  CBC  WBC 4.0 - 10.5 K/uL 6.2   Hemoglobin 12.0 - 15.0 g/dL 54.0   Hematocrit 98.1 - 46.0 % 40.6   Platelets 150 - 400 K/uL 62    I have personally reviewed Radiology images listed below: No images are attached to the encounter.  CT CHEST ABDOMEN PELVIS W CONTRAST Result Date: 08/21/2023 CLINICAL DATA:  History of lymphoma, surveillance. * Tracking Code: BO * EXAM: CT CHEST, ABDOMEN, AND PELVIS WITH CONTRAST TECHNIQUE: Multidetector CT imaging of the chest, abdomen and pelvis was performed following the standard protocol during bolus administration of intravenous contrast. RADIATION DOSE REDUCTION: This exam was performed according to the departmental dose-optimization program which includes automated exposure control, adjustment of the mA and/or kV according to patient size and/or use of iterative reconstruction technique. CONTRAST:  OMNIPAQUE IOHEXOL 300 MG/ML  SOLN COMPARISON:  Multiple  priors including CT August 13, 2022 FINDINGS: CT CHEST FINDINGS Cardiovascular: Aortic atherosclerosis. Normal size heart. Coronary artery calcifications. Mediastinum/Nodes: Prior right thyroidectomy. No suspicious thyroid nodule. No pathologically enlarged mediastinal, hilar or axillary lymph nodes. The esophagus is grossly unremarkable. Lungs/Pleura: No suspicious pulmonary nodules or masses. Scattered atelectasis/scarring. No pleural effusion. No pneumothorax. Musculoskeletal: No aggressive lytic or blastic lesion of bone. CT ABDOMEN PELVIS FINDINGS Hepatobiliary: No suspicious hepatic lesion. Similar segmental intrahepatic biliary ductal dilation in the left lobe of the liver on image 50/2. Common bile duct measures 7.5 mm, stable from prior and within normal limits for patient's age. Gallbladder is unremarkable. Pancreas: No pancreatic ductal dilation or evidence of acute inflammation. Spleen: No splenomegaly or focal splenic lesion. Adrenals/Urinary Tract: Bilateral adrenal glands appear normal. No hydronephrosis. Kidneys demonstrate symmetric enhancement. Urinary bladder is unremarkable for degree of distension. Stomach/Bowel: Stomach is unremarkable for degree of distension. No pathologic dilation of small or large bowel. Colonic stool burden compatible with constipation. Vascular/Lymphatic: Aortic atherosclerosis. Similar appearance of the retroperitoneal lymph nodes, previously indexed lymph node anterior to the aorta measures 11 x 6 mm on image 67/2 previously 12 x 7 mm. Reproductive: Status post hysterectomy. No adnexal masses. Other: No significant abdominopelvic free fluid. Musculoskeletal: No aggressive lytic or blastic lesion of bone. Multilevel degenerative change of the spine. Chronic osseous changes at the pubic symphysis. IMPRESSION: 1. Stable matted non pathologically enlarged retroperitoneal lymph nodes. No new lymphadenopathy in the chest, abdomen or pelvis. 2. Colonic stool burden compatible  with constipation. 3. Aortic atherosclerosis. Electronically Signed   By: Maudry Mayhew M.D.   On: 08/21/2023 16:47     Assessment and plan- Patient is a 78 y.o. female with history of stage IV follicular lymphoma s/p 6 cycles of R-CHOP chemotherapy currently in CR 1 year for routine follow-up  Patient has been getting yearly scans for her follicular lymphoma on her recent scan from March 2025 showed no evidence of recurrent or progressive disease.  Nonpathologically enlarged retroperitoneal lymph nodes which have remained stable.  I will see her back in  6 months with CBC with differential CMP and LDH.  Patient has had chronic thrombocytopenia upon completion of chemotherapy which may be delayed thrombocytopenia secondary to prior use of Rituxan versus ITP.  Platelet counts are 62 today.  Will continue to monitor that conservatively and if it drifts down to less than 30 we will consider treating it as ITP before considering repeat bone marrow biopsy.   Visit Diagnosis 1. Encounter for follow-up surveillance of lymphoma   2. Thrombocytopenia (HCC)      Dr. Owens Shark, MD, MPH [facility] at [facility] [phone] 08/26/2023 11:30 AM

## 2023-12-16 ENCOUNTER — Other Ambulatory Visit: Payer: Self-pay | Admitting: Internal Medicine

## 2024-01-21 ENCOUNTER — Emergency Department

## 2024-01-21 ENCOUNTER — Other Ambulatory Visit: Payer: Self-pay

## 2024-01-21 ENCOUNTER — Emergency Department
Admission: EM | Admit: 2024-01-21 | Discharge: 2024-01-21 | Disposition: A | Discharge status: Home / Self Care | Attending: Emergency Medicine | Admitting: Emergency Medicine

## 2024-01-21 DIAGNOSIS — I110 Hypertensive heart disease with heart failure: Secondary | ICD-10-CM | POA: Insufficient documentation

## 2024-01-21 DIAGNOSIS — I509 Heart failure, unspecified: Secondary | ICD-10-CM | POA: Insufficient documentation

## 2024-01-21 DIAGNOSIS — Z859 Personal history of malignant neoplasm, unspecified: Secondary | ICD-10-CM | POA: Insufficient documentation

## 2024-01-21 DIAGNOSIS — R55 Syncope and collapse: Secondary | ICD-10-CM | POA: Diagnosis present

## 2024-01-21 DIAGNOSIS — E039 Hypothyroidism, unspecified: Secondary | ICD-10-CM | POA: Insufficient documentation

## 2024-01-21 DIAGNOSIS — D696 Thrombocytopenia, unspecified: Secondary | ICD-10-CM | POA: Diagnosis not present

## 2024-01-21 LAB — COMPREHENSIVE METABOLIC PANEL WITH GFR
ALT: 20 U/L (ref 0–44)
AST: 36 U/L (ref 15–41)
Albumin: 4.6 g/dL (ref 3.5–5.0)
Alkaline Phosphatase: 79 U/L (ref 38–126)
Anion gap: 13 (ref 5–15)
BUN: 8 mg/dL (ref 8–23)
CO2: 25 mmol/L (ref 22–32)
Calcium: 10.1 mg/dL (ref 8.9–10.3)
Chloride: 100 mmol/L (ref 98–111)
Creatinine, Ser: 0.8 mg/dL (ref 0.44–1.00)
GFR, Estimated: >60 mL/min (ref >60)
Glucose, Bld: 119 mg/dL — ABNORMAL HIGH (ref 70–99)
Potassium: 4 mmol/L (ref 3.5–5.1)
Sodium: 138 mmol/L (ref 135–145)
Total Bilirubin: 0.5 mg/dL (ref 0.0–1.2)
Total Protein: 7.2 g/dL (ref 6.5–8.1)

## 2024-01-21 LAB — URINALYSIS, ROUTINE W REFLEX MICROSCOPIC
Bilirubin Urine: NEGATIVE
Glucose, UA: NEGATIVE mg/dL
Hgb urine dipstick: NEGATIVE
Ketones, ur: NEGATIVE mg/dL
Leukocytes,Ua: NEGATIVE
Nitrite: NEGATIVE
Protein, ur: NEGATIVE mg/dL
Specific Gravity, Urine: 1.006 (ref 1.005–1.030)
pH: 6 (ref 5.0–8.0)

## 2024-01-21 LAB — CBC
HCT: 41 % (ref 36.0–46.0)
Hemoglobin: 14 g/dL (ref 12.0–15.0)
MCH: 32.4 pg (ref 26.0–34.0)
MCHC: 34.1 g/dL (ref 30.0–36.0)
MCV: 94.9 fL (ref 80.0–100.0)
Platelets: 50 K/uL — ABNORMAL LOW (ref 150–400)
RBC: 4.32 MIL/uL (ref 3.87–5.11)
RDW: 14 % (ref 11.5–15.5)
WBC: 7 K/uL (ref 4.0–10.5)
nRBC: 0 % (ref 0.0–0.2)

## 2024-01-21 NOTE — ED Provider Notes (Signed)
 [facility] Provider Note    Event Date/Time   First MD Initiated Contact with Patient 01/21/24 1041     (approximate)   History   Loss of Consciousness   HPI  Natasha Farrell is a 78 y.o. female history of previous cancer, hypertension, congestive heart failure, prior pneumonia, hypertension, hypothyroidism  Patient reports she been doing well.  Today she felt fine got up at 5 AM due to her normal daily walk of about 3 miles.  No shortness of breath no chest pain while walking.  She met her sister and was with her sister, seated on a bench when she had been sitting for a while she started to feel slightly lightheaded and then fainted.  Her sister was there, patient reports she did not fall to the ground, just would like slumped over at the bench.  She then recalls awakening with family and then EMS coming.  No headache no chest pain no symptoms preceding other than just feeling a little lightheaded right before it happened.  She fainted once a few years ago as well, this is the second time she has ever fainted.  She has had no chest pain no shortness of breath.  She walks 3 miles a day and has not had any symptoms.  She has had no fevers chills or recent illness.  No leg swelling.  No history of blood clots.  Eating and drinking well and does not feel dehydrated.   She does tell me when I checked her blood we will notice her platelet count is low and that is normal for her and followed by hematology     Physical Exam   Triage Vital Signs: ED Triage Vitals  Encounter Vitals Group     BP 01/21/24 1042 (!) 158/78     Girls Systolic BP Percentile --      Girls Diastolic BP Percentile --      Boys Systolic BP Percentile --      Boys Diastolic BP Percentile --      Pulse Rate 01/21/24 1042 67     Resp 01/21/24 1042 13     Temp 01/21/24 1042 99.1 F (37.3 C)     Temp Source 01/21/24 1042 Oral     SpO2 01/21/24 1051 100 %     Weight --      Height  01/21/24 1040 5' 4 (1.626 m)     Head Circumference --      Peak Flow --      Pain Score 01/21/24 1040 0     Pain Loc --      Pain Education --      Exclude from Growth Chart --     Most recent vital signs: Vitals:   01/21/24 1051 01/21/24 1230  BP:  (!) 141/55  Pulse:  63  Resp:    Temp:    SpO2: 100% 100%     General: Awake, no distress.  Very pleasant escorted by her husband CV:  Good peripheral perfusion.  Normal tones and rate no murmur Resp:  Normal effort.  Clear bilateral Abd:  No distention.  Soft nontender noticed Other:  Moves all extremities well.  No pronator drift.  5 out of 5 strength all extremities with normal sensation across the arms and legs bilateral.  Normal facial expressions and extraocular movements.  Speech is clear and content.  Normocephalic atraumatic no cervical tenderness.  Reports her left ankle feels like just perhaps slightly bruised over  the lateral region, but ranges it well reports it does not hurt significantly just feels just a little bit like maybe it got bruised a little bit when this happened.   ED Results / Procedures / Treatments   Labs (all labs ordered are listed, but only abnormal results are displayed) Labs Reviewed  COMPREHENSIVE METABOLIC PANEL WITH GFR - Abnormal; Notable for the following components:      Result Value   Glucose, Bld 119 (*)    All other components within normal limits  CBC - Abnormal; Notable for the following components:   Platelets 50 (*)    All other components within normal limits  URINALYSIS, ROUTINE W REFLEX MICROSCOPIC - Abnormal; Notable for the following components:   Color, Urine YELLOW (*)    APPearance CLEAR (*)    All other components within normal limits     EKG  Interpreted by me at 1045 heart rate 70 QRS 110 QTc 430 Normal sinus rhythm no evidence of acute ischemia.   RADIOLOGY  No indication for central imaging.  Back to his normal baseline no trauma.  No abnormal focal  abnormalities.   PROCEDURES:  Critical Care performed: No  Procedures   MEDICATIONS ORDERED IN ED: Medications - No data to display   IMPRESSION / MDM / ASSESSMENT AND PLAN / ED COURSE  I reviewed the triage vital signs and the nursing notes.                              Differential diagnosis includes, but is not limited to, cause of syncope including dehydration, vasovagal, orthostatic, anemia, dysrhythmia, etc.  Differential diagnosis for cause of syncope can be quite broad however it is very reassuring that she is back to her baseline she feels normal and well now.  She did not experience any chest pain shortness of breath palpitations or preceding symptoms other than starting to feel faint and may be just slightly nauseated right before this happened while seated with family.  Patient technically has a history of congestive heart failure, positive by [facility] syncope rule for this reason.  I discussed with her as well as her husband, made recommendation that he would be reasonable to observe her overnight due to the passing out and to rule out causes such as rapid heartbeat, irregular heart rhythm, or other concerning causes for fainting.  However with shared medical decision making, also in light of how well the patient appears and her reassuring workup including ECG and no preceding concerning cardiac symptoms, I think it would be also reasonable for discharge.  Patient and husband elect they would like for her to discharge, will be with her, if she has any concerning symptoms at all such as shortness of breath chest pain feeling faint again, or other concerns arise they will call 911 return to ER right away.  She is very well-appearing asymptomatic in the emergency department stable hemodynamics.  ECG normal.  Telemetry reviewed by me, normal sinus rhythm without abnormalities   Patient's presentation is most consistent with acute complicated illness / injury requiring  diagnostic workup.   The patient is on the cardiac monitor to evaluate for evidence of arrhythmia and/or significant heart rate changes.  Normal chemistry panel.  CBC normal with thrombocytopenia, but this is again known and documented to be quite chronic and followed closely by oncology, who makes specific mention that they will continue to follow unless they see platelet count  falling below 30.  She has no evidence of bleeding injury or trauma.    Clinical Course as of 01/21/24 1331  Sat Jan 21, 2024  1131 Labs reassuring patient feels well.  Will ambulate the patient and if she feels well, asymptomatic anticipate discharge to home. [MQ]  1224 Reviewed with oncology team Dr. Layman.  Advises no concern or known correlation between thrombocytopenia and syncope which he is aware.  He advises patient has appointment scheduled in October for recheck of platelets. [MQ]  1225 Patient has ambulated well feels normal well back to her normal.  No symptoms.  Given the associated thrombocytopenia, CT head ordered just to be certain there is no evidence of [MQ]  1225 To the associate intracranial seems quite unlikely [MQ]  1242 CT head negative for acute finding.  Urinalysis normal.  Patient feeling well has ambulated ready for discharge.  Had previously discussed observation versus discharge, patient feeling well I do not feel that there is a high concern for acute cardiac syncope or life-threatening cause.  Will discharge, patient to be with family and careful return precautions provided. [MQ]    Clinical Course User Index [MQ] Dicky Anes, MD   Return precautions and treatment recommendations and follow-up discussed with the patient who is agreeable with the plan.   FINAL CLINICAL IMPRESSION(S) / ED DIAGNOSES   Final diagnoses:  Syncope and collapse     Rx / DC Orders   ED Discharge Orders     None        Note:  This document was prepared using Dragon voice recognition software  and may include unintentional dictation errors.   Dicky Anes, MD 01/21/24 [phone]

## 2024-01-21 NOTE — ED Notes (Signed)
 Pt back from X-ray.

## 2024-01-21 NOTE — ED Triage Notes (Signed)
 Pt in via [facility] from home for syncopal episode this am. Pt was sitting on a stool and talking with family when she started to feel hot and sweaty. Per family pt passed out for 1 minute. Denies hitting head. Pt A&Ox4 at bedside.   BS-105 147/74 HR-70 O2- 98%

## 2024-01-21 NOTE — Discharge Instructions (Addendum)
You have been seen today in the Emergency Department (ED)  for syncope (passing out).  Your workup including labs and EKG show reassuring results.   ? ?Please call your regular doctor as soon as possible to schedule the next available clinic appointment to follow up with him/her regarding your visit to the ED and your symptoms.  Return to the Emergency Department (ED)  if you have any further syncopal episodes (pass out again) or develop ANY chest pain, pressure, tightness, trouble breathing, sudden sweating, or other symptoms that concern you. ? ?

## 2024-01-21 NOTE — ED Notes (Signed)
 Pt ambulated to the bathroom at this time. Pt gait steady. Pt providing urine sample.

## 2024-01-21 NOTE — ED Notes (Signed)
 Pt ambulated in the hall. No symptoms reported by pt. Gait steady.

## 2024-01-21 NOTE — ED Notes (Signed)
 Pt to Xray at this time

## 2024-01-30 ENCOUNTER — Encounter: Payer: Self-pay | Admitting: Oncology

## 2024-02-13 ENCOUNTER — Other Ambulatory Visit: Payer: Self-pay | Admitting: Physician Assistant

## 2024-02-13 DIAGNOSIS — Z1231 Encounter for screening mammogram for malignant neoplasm of breast: Secondary | ICD-10-CM

## 2024-02-24 ENCOUNTER — Encounter: Payer: Self-pay | Admitting: Oncology

## 2024-02-24 ENCOUNTER — Inpatient Hospital Stay: Admitting: Oncology

## 2024-02-24 ENCOUNTER — Inpatient Hospital Stay: Attending: Oncology

## 2024-02-24 VITALS — BP 139/53 | HR 68 | Temp 96.0°F | Resp 17 | Wt 184.0 lb

## 2024-02-24 DIAGNOSIS — Z08 Encounter for follow-up examination after completed treatment for malignant neoplasm: Secondary | ICD-10-CM | POA: Diagnosis not present

## 2024-02-24 DIAGNOSIS — Z8572 Personal history of non-Hodgkin lymphomas: Secondary | ICD-10-CM

## 2024-02-24 DIAGNOSIS — D693 Immune thrombocytopenic purpura: Secondary | ICD-10-CM | POA: Insufficient documentation

## 2024-02-24 LAB — CBC WITH DIFFERENTIAL (CANCER CENTER ONLY)
Abs Immature Granulocytes: 0.07 K/uL (ref 0.00–0.07)
Basophils Absolute: 0.1 K/uL (ref 0.0–0.1)
Basophils Relative: 1 %
Eosinophils Absolute: 0.1 K/uL (ref 0.0–0.5)
Eosinophils Relative: 2 %
HCT: 41.2 % (ref 36.0–46.0)
Hemoglobin: 13.9 g/dL (ref 12.0–15.0)
Immature Granulocytes: 1 %
Lymphocytes Relative: 22 %
Lymphs Abs: 1.4 K/uL (ref 0.7–4.0)
MCH: 31.2 pg (ref 26.0–34.0)
MCHC: 33.7 g/dL (ref 30.0–36.0)
MCV: 92.6 fL (ref 80.0–100.0)
Monocytes Absolute: 1.2 K/uL — ABNORMAL HIGH (ref 0.1–1.0)
Monocytes Relative: 19 %
Neutro Abs: 3.6 K/uL (ref 1.7–7.7)
Neutrophils Relative %: 55 %
Platelet Count: 50 K/uL — ABNORMAL LOW (ref 150–400)
RBC: 4.45 MIL/uL (ref 3.87–5.11)
RDW: 14 % (ref 11.5–15.5)
WBC Count: 6.4 K/uL (ref 4.0–10.5)
nRBC: 0 % (ref 0.0–0.2)

## 2024-02-24 LAB — CMP (CANCER CENTER ONLY)
ALT: 22 U/L (ref 0–44)
AST: 30 U/L (ref 15–41)
Albumin: 4.9 g/dL (ref 3.5–5.0)
Alkaline Phosphatase: 93 U/L (ref 38–126)
Anion gap: 10 (ref 5–15)
BUN: 7 mg/dL — ABNORMAL LOW (ref 8–23)
CO2: 27 mmol/L (ref 22–32)
Calcium: 9.9 mg/dL (ref 8.9–10.3)
Chloride: 100 mmol/L (ref 98–111)
Creatinine: 0.69 mg/dL (ref 0.44–1.00)
GFR, Estimated: >60 mL/min (ref >60)
Glucose, Bld: 102 mg/dL — ABNORMAL HIGH (ref 70–99)
Potassium: 4 mmol/L (ref 3.5–5.1)
Sodium: 137 mmol/L (ref 135–145)
Total Bilirubin: 0.8 mg/dL (ref 0.0–1.2)
Total Protein: 7.7 g/dL (ref 6.5–8.1)

## 2024-02-24 LAB — IMMATURE PLATELET FRACTION: Immature Platelet Fraction: 23.9 % — ABNORMAL HIGH (ref 1.2–8.6)

## 2024-02-24 LAB — LACTATE DEHYDROGENASE: LDH: 149 U/L (ref 98–192)

## 2024-02-24 NOTE — Progress Notes (Signed)
 Hematology/Oncology Consult note [facility]  [phone] [phone]  Patient Care Team: Marikay Eva POUR, PA as PCP - General (Physician Assistant) End, Lonni, MD as PCP - Cardiology (Cardiology) Dessa Reyes ORN, MD as Consulting Physician (General Surgery) End, Lonni, MD as Consulting Physician (Cardiology) Melanee Annah BROCKS, MD as Consulting Physician (Hematology and Oncology)   Name of the patient: Natasha Farrell  [id number]  14-Jun-1945   Date of visit: 02/24/24  Diagnosis-stage IV follicular lymphoma currently in remission  Chief complaint/ Reason for visit-routine follow-up of follicular lymphoma  Heme/Onc history: Patient is a 78 year old female now transfer of care from Dr. Rudell.  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  She had presented with extensive adenopathy and massive splenomegaly.   Right axillary ultrasound guided biopsy on 04/21/2020 revealed fragments of lymph node with reactive follicular hyperplasia. There was no definite evidence of malignancy. Flow cytometry revealed a CD10+ B cell population. Clonality could not be evaluated due to non-specific light chain binding. There was no evidence of a T cell lymphoproliferative disorder.      PET scan on 08/12/2020 revealed widespread adenopathy, visceral involvement and signs of near diffuse, multifocal bony involvement, distribution most c/w lymphoma, Deauville category 5. There was ileal involvement as well. Peritoneal involvement was suggested as well with nodular changes about the peritoneum but without significant FDG uptake. There was paraspinal soft tissue without frank bony destruction. Scattered areas of sclerosis underestimated the degree of bony involvement. Abdominal ascites may be related to anasarca and or lymphatic congestion with marked enlargement of LEFT-sided pleural effusion and mild enlargement of small  RIGHT-sided pleural fluid.  Left-sided pleural effusion has been attributed to follicular lymphoma and she has undergone multiple thoracentesis in the past with the last one that was done in April 2022.     Bone marrow on 08/04/2020 revealed variably cellular bone marrow with trilineage hematopoiesis and involvement by an atypical lymphoid proliferation  The bone marrow core biopsy demonstrated involvement by an atypical lymphoid proliferation with paratrabecular lymphoid aggregates and  patchy infiltration of the bone marrow space with accompanying fibrosis. The infiltrate was composed of mixed B-cells and T-cells. At least a subset of the B-cells co-expressed CD10 and BCL6 without definitive BCL2 expression. Scattered larger CD20-positive larger B-cells were also present. Flow cytometry showed no definitive atypical B-cell population. No immunophenotypically aberrant T-cell population was identified. There were no increase in blasts. Monocytes showed heterogeneous CD56 expression.  Cytogenetics were normal (46, XX).   Patient had complete response to therapy after 6 cycles of R-CHOP ending in July 2022 and she remains in CR 1 since then.Scans after 6 cycles of R-CHOP chemotherapy showed decrease in the size of adenopathy both above and below the diaphragm with significant reduction/resolution in the abnormal hypermetabolic activity.  There was persistent low-level hypermetabolic activity noted in the abdominal and pelvic lymph nodes with an SUV between 1-3 but minimally about the background mediastinal blood pool activity.  Deauville 2/3   Patient also went for second opinion to [facility] and overall decision was made against maintenance Rituxan .  Interval history-patient had an episode of syncope in August 2025 and fractured her left toe.  She is presently in a boot for that.  She underwent syncope workup which was unremarkable.  Denies any changes in her appetite or weight.  Denies any unintentional weight  loss.  ECOG PS- 1 Pain scale- 0   Review of systems- Review of Systems  Constitutional:  Negative for chills, fever, malaise/fatigue and weight loss.  HENT:  Negative for congestion, ear discharge and nosebleeds.   Eyes:  Negative for blurred vision.  Respiratory:  Negative for cough, hemoptysis, sputum production, shortness of breath and wheezing.   Cardiovascular:  Negative for chest pain, palpitations, orthopnea and claudication.  Gastrointestinal:  Negative for abdominal pain, blood in stool, constipation, diarrhea, heartburn, melena, nausea and vomiting.  Genitourinary:  Negative for dysuria, flank pain, frequency, hematuria and urgency.  Musculoskeletal:  Negative for back pain, joint pain and myalgias.  Skin:  Negative for rash.  Neurological:  Negative for dizziness, tingling, focal weakness, seizures, weakness and headaches.  Endo/Heme/Allergies:  Does not bruise/bleed easily.  Psychiatric/Behavioral:  Negative for depression and suicidal ideas. The patient does not have insomnia.       Allergies  Allergen Reactions   Lisinopril Swelling    angioedema     Past Medical History:  Diagnosis Date   Arthritis    Follicular lymphoma (HCC) 2022   Hx of hysterectomy    Hyperlipidemia    Hypertension    Thyroid  disease    rt thryroidectomy  09/25/2010   Wears dentures    partial upper and lower     Past Surgical History:  Procedure Laterality Date   ABDOMINAL HYSTERECTOMY     BREAST BIOPSY Right 04/21/2020   hydromark 3 us  bx FRAGMENTS OF LYMPH NODE WITH REACTIVE FOLLICULARHYPERPLASIA of the RIGHT axilla. There is no evidence of a T cell lymphoproliferative disorder   CATARACT EXTRACTION W/PHACO Right 05/25/2022   Procedure: CATARACT EXTRACTION PHACO AND INTRAOCULAR LENS PLACEMENT (IOC) RIGHT;  Surgeon: Jaye Fallow, MD;  Location: [facility];  Service: Ophthalmology;  Laterality: Right;  6.39 0:38.5   CATARACT EXTRACTION W/PHACO Left 06/08/2022    Procedure: CATARACT EXTRACTION PHACO AND INTRAOCULAR LENS PLACEMENT (IOC) LEFT  6.05  00:45.4;  Surgeon: Jaye Fallow, MD;  Location: [facility];  Service: Ophthalmology;  Laterality: Left;   LYMPH NODE BIOPSY Left 08/01/2020   Procedure: LYMPH NODE BIOPSY;  Surgeon: Dessa Reyes ORN, MD;  Location: [facility];  Service: General;  Laterality: Left;   PORTACATH PLACEMENT Left 08/22/2020   Procedure: INSERTION PORT-A-CATH;  Surgeon: Dessa Reyes ORN, MD;  Location: [facility];  Service: General;  Laterality: Left;    Social History   Socioeconomic History   Marital status: Married    Spouse name: Not on file   Number of children: Not on file   Years of education: Not on file   Highest education level: Not on file  Occupational History   Not on file  Tobacco Use   Smoking status: Never   Smokeless tobacco: Never  Vaping Use   Vaping status: Never Used  Substance and Sexual Activity   Alcohol use: No   Drug use: No   Sexual activity: Not on file  Other Topics Concern   Not on file  Social History Narrative   Not on file   Social Drivers of Health   Financial Resource Strain: Low Risk  (02/01/2024)   Received from [facility]   Overall Financial Resource Strain (CARDIA)    Difficulty of Paying Living Expenses: Not very hard  Food Insecurity: No Food Insecurity (02/01/2024)   Received from [facility]   Hunger Vital Sign    Within the past 12 months, you worried that your food would run out before you got the money to buy more.: Never true    Within  the past 12 months, the food you bought just didn't last and you didn't have money to get more.: Never true  Transportation Needs: No Transportation Needs (02/01/2024)   Received from [facility] - Transportation    In the past 12 months, has lack of transportation kept you from medical appointments or from getting medications?: No    Lack of Transportation  (Non-Medical): No  Physical Activity: Not on file  Stress: Not on file  Social Connections: Unknown (02/07/2019)   Received from [facility]   Social Connection and Isolation Panel    Frequency of Communication with Friends and Family: Not on file    Frequency of Social Gatherings with Friends and Family: Not on file    Attends Religious Services: Not on file    Active Member of Clubs or Organizations: Not on file    Attends Banker Meetings: Not on file    Are you married, widowed, divorced, separated, never married, or living with a partner?: Married  Intimate Partner Violence: Not on file    Family History  Problem Relation Age of Onset   Prostate cancer Father    Breast cancer Sister 54   Colon cancer Sister    Kidney cancer Sister    Breast cancer Sister      Current Outpatient Medications:    acetaminophen  (TYLENOL ) 500 MG tablet, Take 500 mg by mouth every 6 (six) hours as needed for moderate pain, fever or headache., Disp: , Rfl:    carvedilol  (COREG ) 25 MG tablet, TAKE 1 TABLET BY MOUTH TWICE A DAY, Disp: 180 tablet, Rfl: 0   Cholecalciferol  (VITAMIN D3) 50 MCG (2000 UT) TABS, Take by mouth daily., Disp: , Rfl:    cyanocobalamin 1000 MCG tablet, Take 1,000 mcg by mouth daily., Disp: , Rfl:    levothyroxine  (SYNTHROID ) 50 MCG tablet, Take 50 mcg by mouth daily before breakfast., Disp: , Rfl:    loratadine  (CLARITIN ) 10 MG tablet, Take 10 mg by mouth daily as needed for allergies., Disp: , Rfl:    meloxicam (MOBIC) 15 MG tablet, Take 15 mg by mouth., Disp: , Rfl:    Multiple Vitamin (MULTIVITAMIN WITH MINERALS) TABS tablet, Take 1 tablet by mouth daily., Disp: , Rfl:    pravastatin  (PRAVACHOL ) 40 MG tablet, Take 40 mg by mouth daily., Disp: , Rfl:    spironolactone  (ALDACTONE ) 50 MG tablet, Take 0.5 tablet (25 mg) by mouth once daily , Disp: , Rfl:  No current facility-administered medications for this visit.  Facility-Administered  Medications Ordered in Other Visits:    sodium chloride  flush (NS) 0.9 % injection 10 mL, 10 mL, Intracatheter, PRN, Melanee Annah BROCKS, MD, 10 mL at 12/10/20 0830  Physical exam:  Vitals:   02/24/24 1005  BP: (!) 139/53  Pulse: 68  Resp: 17  Temp: (!) 96 F (35.6 C)  TempSrc: Tympanic  SpO2: 100%  Weight: 184 lb (83.5 kg)   Physical Exam Cardiovascular:     Rate and Rhythm: Normal rate and regular rhythm.     Heart sounds: Normal heart sounds.  Pulmonary:     Effort: Pulmonary effort is normal.     Breath sounds: Normal breath sounds.  Abdominal:     General: Bowel sounds are normal.     Palpations: Abdomen is soft.  Musculoskeletal:     Comments: Dressing in place over left foot and patient has a Radio broadcast assistant in place  Lymphadenopathy:  Comments: No palpable cervical, supraclavicular, axillary or inguinal adenopathy    Skin:    General: Skin is warm and dry.  Neurological:     Mental Status: She is alert and oriented to person, place, and time.      I have personally reviewed labs listed below:    Latest Ref Rng & Units 02/24/2024    9:42 AM  CMP  Glucose 70 - 99 mg/dL 897   BUN 8 - 23 mg/dL 7   Creatinine 9.55 - 8.99 mg/dL 9.30   Sodium 864 - 854 mmol/L 137   Potassium 3.5 - 5.1 mmol/L 4.0   Chloride 98 - 111 mmol/L 100   CO2 22 - 32 mmol/L 27   Calcium 8.9 - 10.3 mg/dL 9.9   Total Protein 6.5 - 8.1 g/dL 7.7   Total Bilirubin 0.0 - 1.2 mg/dL 0.8   Alkaline Phos 38 - 126 U/L 93   AST 15 - 41 U/L 30   ALT 0 - 44 U/L 22       Latest Ref Rng & Units 02/24/2024    9:41 AM  CBC  WBC 4.0 - 10.5 K/uL 6.4   Hemoglobin 12.0 - 15.0 g/dL 86.0   Hematocrit 63.9 - 46.0 % 41.2   Platelets 150 - 400 K/uL 50      Assessment and plan- Patient is a 78 y.o. female with history of stage IV follicular lymphoma status post 6 cycles of R-CHOP chemotherapy presently in CR 1.  This is a routine follow-up visit  Clinically patient is doing well with no concerning signs and  symptoms of recurrence based on today's exam.  Her CT scan from March 2025 was unremarkable.  Will plan to repeat CT chest abdomen pelvis with contrast in March 2026.  She will then be completing 4 years of surveillance since completing chemotherapy.  I will plan to do yearly scans up to 5 years from the time of hemotherapy completion.  Thrombocytopenia: Patient has had chronic thrombocytopenia at least for the last 3 to 4 years but has been having a gradual decline in her platelet count presently at 50.  IPF is elevated indicative of peripheral destructive process.  If platelet counts are less than 30 I will plan to give her Decadron  for possible ITP at that time.  CBC with differential in 3 and 6 months and I will see her back in 6 months with scans prior   Visit Diagnosis 1. Chronic idiopathic thrombocytopenia (HCC)   2. Encounter for follow-up surveillance of lymphoma      Dr. Annah Skene, MD, MPH [facility] [phone] 02/24/2024 11:48 AM

## 2024-02-24 NOTE — Progress Notes (Signed)
 Patient here for follow-up appointment, concerns of recent fall and toe fracture

## 2024-03-10 ENCOUNTER — Other Ambulatory Visit: Payer: Self-pay | Admitting: Internal Medicine

## 2024-03-28 ENCOUNTER — Ambulatory Visit: Attending: Internal Medicine | Admitting: Internal Medicine

## 2024-03-28 ENCOUNTER — Encounter: Payer: Self-pay | Admitting: *Deleted

## 2024-03-28 ENCOUNTER — Encounter: Payer: Self-pay | Admitting: Internal Medicine

## 2024-03-28 VITALS — BP 120/70 | HR 76 | Ht 64.0 in | Wt 183.4 lb

## 2024-03-28 DIAGNOSIS — R002 Palpitations: Secondary | ICD-10-CM

## 2024-03-28 DIAGNOSIS — R000 Tachycardia, unspecified: Secondary | ICD-10-CM | POA: Diagnosis not present

## 2024-03-28 DIAGNOSIS — I10 Essential (primary) hypertension: Secondary | ICD-10-CM

## 2024-03-28 DIAGNOSIS — E785 Hyperlipidemia, unspecified: Secondary | ICD-10-CM

## 2024-03-28 DIAGNOSIS — D696 Thrombocytopenia, unspecified: Secondary | ICD-10-CM

## 2024-03-28 DIAGNOSIS — R55 Syncope and collapse: Secondary | ICD-10-CM | POA: Diagnosis not present

## 2024-03-28 DIAGNOSIS — I700 Atherosclerosis of aorta: Secondary | ICD-10-CM

## 2024-03-28 NOTE — Progress Notes (Unsigned)
 Cardiology Office Note:  .   Date:  03/30/2024  ID:  Natasha Farrell, DOB 03/31/1946, MRN [id number] PCP: Marikay Eva POUR, PA  [facility] Providers Cardiologist:  Lonni Hanson, MD     History of Present Illness: .   Natasha Farrell is a 78 y.o. female with history of sinus tachycardia, hypertension, hyperlipidemia, follicular lymphoma, and thyroid  disease status post right thyroidectomy, who presents for follow-up of tachycardia and hypertension.  I last saw her in 02/2023, at which time she reported intermittent palpitations, similar to her baseline.  She complained of intermittent orthostatic lightheadedness without syncope or falls.  We discussed de-escalation of carvedilol  but agreed to defer this in order to prevent recrudescence of her palpitations.  I encouraged Natasha Farrell to stay well-hydrated.  Today, Natasha Farrell reports that she has been feeling fairly well.  However, she recounts a syncopal episode that occurred in late August.  She had been feeling well earlier in the day and had actually walked for 3 miles.  She went to the store with her sister and after returning home was seated on a stool.  Next thing she remembers is lying on the grounds with the paramedics coming to attend to her.  She fractured one of her toes in the process, which she has just now recovered from.  She did not have any prodromal symptoms, though her sister felt like she seemed a bit off balance.  She specifically denies any preceding palpitations, lightheadedness, or chest pain.  Her sister told her that Natasha Farrell was out for about a minute.  Only thing she recalls is feeling hot and sweaty, though this has been a chronic issue for her.  She was evaluated in the ED, with only notable finding on workup being thrombocytopenia.  She subsequently saw her PCP, who obtained an echo and 48-hour Holter monitor.  Echo did not show any significant structural abnormalities.  Holter monitor results not available for  review but were reportedly negative per Natasha Farrell.  She has followed with Dr. Melanee, who did not have an obvious explanation for Natasha Farrell is thrombocytopenia but will continue to follow it closely.  Natasha Farrell has not had any bleeding or further syncope/lightheadedness.  ROS: See HPI  Studies Reviewed: Natasha Farrell   EKG Interpretation Date/Time:  Wednesday March 28 2024 14:34:32 EST Ventricular Rate:  76 PR Interval:  142 QRS Duration:  78 QT Interval:  382 QTC Calculation: 429 R Axis:   12  Text Interpretation: Normal sinus rhythm Normal ECG Normal ECG When compared with ECG of 21-Jan-2024 10:43, Premature atrial complexes are no longer Present Confirmed by Natasha Farrell, Lonni [phone]) on 03/30/2024 7:44:50 AM    TTE (02/14/2024, [facility]): Normal LV size and wall thickness.  LVEF greater than 55% with grade 1 diastolic dysfunction.  Normal RV size and function.  Mild TR.  Normal PA pressure.  Carotid Doppler (02/14/2024, [facility]): No hemodynamically significant stenosis in either carotid artery.  Risk Assessment/Calculations:         Physical Exam:   VS:  BP 120/70 (BP Location: Left Arm, Patient Position: Sitting, Cuff Size: Normal)   Pulse 76 Comment: 84 oximeter  Ht 5' 4 (1.626 m)   Wt 183 lb 6.4 oz (83.2 kg)   SpO2 96%   BMI 31.48 kg/m    Wt Readings from Last 3 Encounters:  03/28/24 183 lb 6.4 oz (83.2 kg)  02/24/24 184 lb (83.5 kg)  08/26/23 180 lb 14.4 oz (82.1 kg)  General:  NAD. Neck: No JVD or HJR. Lungs: Clear to auscultation bilaterally without wheezes or crackles. Heart: Regular rate and rhythm without murmurs, rubs, or gallops. Abdomen: Soft, nontender, nondistended. Extremities: No lower extremity edema.  ASSESSMENT AND PLAN: .    Syncope: Ms. Barbato had an episode of sudden, unexplained syncope in late August leading to toe fracture.  ED evaluation was unrevealing other than worsening chronic thrombocytopenia.  Subsequent echo and carotid Dopplers did  not show any significant abnormalities.  Ms. Yorks reportedly wore a Holter monitor as well that was unremarkable per her report (results are not available for review).  We have discussed potential etiologies for sudden syncope, and I remain concerned that she may have underlying rhythm disturbance.  We discussed obtaining a 28-day event monitor versus referral to EP for further evaluation and possible implantable loop recorder placement.  Ms. Panjwani wishes to start with a 28-day event monitor first.  I have cautioned her against driving or doing other activities where sudden loss of consciousness could be harmful pending her workup.  Hypertension: Blood pressure well-controlled today.  Continue current doses of carvedilol  and spironolactone .  Sinus tachycardia and palpitations: Heart rate well-controlled today.  Given that I do not see any evidence of conduction disease, we will continue her current dose of carvedilol  25 mg twice daily.  Hyperlipidemia and aortic atherosclerosis: Continue pravastatin  at the direction of Ms. McLaughlin.  Thrombocytopenia: Chronic but progressing with most recent platelet count being 50.  Continue close follow-up with Dr. Melanee in the heme-onc clinic.    Dispo: Return to clinic in 3 months.  Signed, Lonni Hanson, MD

## 2024-03-28 NOTE — Patient Instructions (Signed)
 Medication Instructions:  Your physician recommends that you continue on your current medications as directed. Please refer to the Current Medication list given to you today.    *If you need a refill on your cardiac medications before your next appointment, please call your pharmacy*  Lab Work: No labs ordered today    Testing/Procedures: [employer] Cardiac Event Monitor Instructions  Your physician has requested you wear your cardiac event monitor for ___30__ days, (1-30). [employer] may call or text to confirm a shipping address. The monitor will be sent to a land address via UPS. [employer] will not ship a monitor to a PO BOX. It typically takes 3-5 days to receive your monitor after it has been enrolled. [employer] will assist with USPS tracking if your package is delayed. The telephone number for [employer] is [phone]. Once you have received your monitor, please review the enclosed instructions. Instruction tutorials can also be viewed under help and settings on the enclosed cell phone. Your monitor has already been registered assigning a specific monitor serial # to you.  Billing and Self Pay Discount Information  [employer] has been provided the insurance information we had on file for you.  If your insurance has been updated, please call [employer] at [phone] to provide them with your updated insurance information.   [employer] offers a discounted Self Pay option for patients who have insurance that does not cover their cardiac event monitor or patients without insurance.  The discounted cost of a Self Pay Cardiac Event Monitor would be $225.00 , if the patient contacts [employer] at [phone] within 7 days of applying the monitor to make payment arrangements.  If the patient does not contact [employer] within 7 days of applying the monitor, the cost of the cardiac event monitor will be $350.00.  Applying the monitor  Remove cell phone from case and turn it on. The  cell phone works as it consultant and needs to be within [employer] of you at all times. The cell phone will need to be charged on a daily basis. We recommend you plug the cell phone into the enclosed charger at your bedside table every night.  Monitor batteries: You will receive two monitor batteries labelled #1 and #2. These are your recorders. Plug battery #2 onto the second connection on the enclosed charger. Keep one battery on the charger at all times. This will keep the monitor battery deactivated. It will also keep it fully charged for when you need to switch your monitor batteries. A small light will be blinking on the battery emblem when it is charging. The light on the battery emblem will remain on when the battery is fully charged.  Open package of a Monitor strip. Insert battery #1 into black hood on strip and gently squeeze monitor battery onto connection as indicated in instruction booklet. Set aside while preparing skin.  Choose location for your strip, vertical or horizontal, as indicated in the instruction booklet. Shave to remove all hair from location. There cannot be any lotions, oils, powders, or colognes on skin where monitor is to be applied. Wipe skin clean with enclosed Saline wipe. Dry skin completely.  Peel paper labeled #1 off the back of the Monitor strip exposing the adhesive. Place the monitor on the chest in the vertical or horizontal position shown in the instruction booklet. One arrow on the monitor strip must be pointing upward. Carefully remove paper labeled #2, attaching remainder of strip to your skin. Try not to create any folds or wrinkles  in the strip as you apply it.  Firmly press and release the circle in the center of the monitor battery. You will hear a small beep. This is turning the monitor battery on. The heart emblem on the monitor battery will light up every 5 seconds if the monitor battery in turned on and connected to the patient securely.  Do not push and hold the circle down as this turns the monitor battery off. The cell phone will locate the monitor battery. A screen will appear on the cell phone checking the connection of your monitor strip. This may read poor connection initially but change to good connection within the next minute. Once your monitor accepts the connection you will hear a series of 3 beeps followed by a climbing crescendo of beeps. A screen will appear on the cell phone showing the two monitor strip placement options. Touch the picture that demonstrates where you applied the monitor strip.  Your monitor strip and battery are waterproof. You are able to shower, bathe, or swim with the monitor on. They just ask you do not submerge deeper than 3 feet underwater. We recommend removing the monitor if you are swimming in a lake, river, or ocean.  Your monitor battery will need to be switched to a fully charged monitor battery approximately once a week. The cell phone will alert you of an action which needs to be made.  On the cell phone, tap for details to reveal connection status, monitor battery status, and cell phone battery status. The green dots indicates your monitor is in good status. A red dot indicates there is something that needs your attention.  To record a symptom, click the circle on the monitor battery. In 30-60 seconds a list of symptoms will appear on the cell phone. Select your symptom and tap save. Your monitor will record a sustained or significant arrhythmia regardless of you clicking the button. Some patients do not feel the heart rhythm irregularities. [employer] will notify us  of any serious or critical events.  Refer to instruction booklet for instructions on switching batteries, changing strips, the Do not disturb or Pause features, or any additional questions.  Call [employer] at [phone], to confirm your monitor is transmitting and record your baseline. They will answer any  questions you may have regarding the monitor instructions at that time.  Returning the monitor to [employer]  Place all equipment back into blue box. Peel off strip of paper to expose adhesive and close box securely. There is a prepaid UPS shipping label on this box. Drop in a UPS drop box, or at a UPS facility like [employer]. You may also contact [employer] to arrange UPS to pick up monitor package at your home.   Follow-Up: At [facility], you and your health needs are our priority.  As part of our continuing mission to provide you with exceptional heart care, our providers are all part of one team.  This team includes your primary Cardiologist (physician) and Advanced Practice Providers or APPs (Physician Assistants and Nurse Practitioners) who all work together to provide you with the care you need, when you need it.  Your next appointment:   3 month(s)  Provider:   You may see Lonni Hanson, MD or one of the following Advanced Practice Providers on your designated Care Team:   Lonni Meager, NP Lesley Maffucci, PA-C Bernardino Bring, PA-C Cadence [place], PA-C Tylene Lunch, NP Barnie Hila, NP

## 2024-03-28 NOTE — Progress Notes (Signed)
 Patient enrolled for [facility] [employer]/ [employer] to ship a 30 day cardiac event monitor to her address on file. Dr. Lonni End to read.

## 2024-03-30 ENCOUNTER — Encounter: Payer: Self-pay | Admitting: Internal Medicine

## 2024-03-30 DIAGNOSIS — R55 Syncope and collapse: Secondary | ICD-10-CM | POA: Insufficient documentation

## 2024-04-04 ENCOUNTER — Ambulatory Visit
Admission: RE | Admit: 2024-04-04 | Discharge: 2024-04-04 | Disposition: A | Discharge status: Home / Self Care | Source: Ambulatory Visit | Attending: Physician Assistant | Admitting: Physician Assistant

## 2024-04-04 DIAGNOSIS — Z1231 Encounter for screening mammogram for malignant neoplasm of breast: Secondary | ICD-10-CM | POA: Diagnosis present

## 2024-05-06 ENCOUNTER — Encounter: Payer: Self-pay | Admitting: Oncology

## 2024-05-07 NOTE — Telephone Encounter (Signed)
 When is your colonoscopy and who is doing it? We can repeat your cbc a week prior to colonoscopy If platelets are > or = 50 you can proceed with colonoscopy. If it is <50, we can give you a trial of steroids and see if that will increase your platelets Regards, Dr. Melanee

## 2024-05-08 ENCOUNTER — Ambulatory Visit

## 2024-05-08 DIAGNOSIS — R55 Syncope and collapse: Secondary | ICD-10-CM

## 2024-05-09 NOTE — Telephone Encounter (Signed)
 Yes cbc on 1/5 sounds good

## 2024-05-13 DIAGNOSIS — R55 Syncope and collapse: Secondary | ICD-10-CM

## 2024-05-14 ENCOUNTER — Ambulatory Visit: Payer: Self-pay | Admitting: Internal Medicine

## 2024-05-25 ENCOUNTER — Other Ambulatory Visit

## 2024-05-28 ENCOUNTER — Inpatient Hospital Stay: Attending: Oncology

## 2024-05-28 ENCOUNTER — Ambulatory Visit: Payer: Self-pay | Admitting: Oncology

## 2024-05-28 DIAGNOSIS — Z08 Encounter for follow-up examination after completed treatment for malignant neoplasm: Secondary | ICD-10-CM

## 2024-05-28 LAB — CBC (CANCER CENTER ONLY)
HCT: 40.5 % (ref 36.0–46.0)
Hemoglobin: 13.7 g/dL (ref 12.0–15.0)
MCH: 32.1 pg (ref 26.0–34.0)
MCHC: 33.8 g/dL (ref 30.0–36.0)
MCV: 94.8 fL (ref 80.0–100.0)
Platelet Count: 52 K/uL — ABNORMAL LOW (ref 150–400)
RBC: 4.27 MIL/uL (ref 3.87–5.11)
RDW: 13.8 % (ref 11.5–15.5)
WBC Count: 5.5 K/uL (ref 4.0–10.5)
nRBC: 0 % (ref 0.0–0.2)

## 2024-05-29 NOTE — Telephone Encounter (Signed)
-----   Message from Annah Skene, MD sent at 05/28/2024  1:07 PM EST ----- I believe patient has a colonoscopy scheduled at some point soon.  Platelets remain more than 50 and therefore okay for her to proceed with colonoscopy without any trial of steroids

## 2024-05-29 NOTE — Telephone Encounter (Signed)
 Per Dr. Melanee I believe patient has a colonoscopy scheduled at some point soon. Platelets remain more than 50 and therefore okay for her to proceed with colonoscopy without any trial of steroids.  It appears patient is scheduled to have colonoscopy on 06/04/24 with Dr. Ole Schick (Endo).  Outbound call to patient; informed of above.  No further questions / concerns at this time.

## 2024-06-04 ENCOUNTER — Ambulatory Visit

## 2024-06-04 ENCOUNTER — Encounter
Admission: RE | Disposition: A | Discharge status: Home / Self Care | Payer: Self-pay | Source: Home / Self Care | Attending: Gastroenterology

## 2024-06-04 ENCOUNTER — Other Ambulatory Visit: Payer: Self-pay

## 2024-06-04 ENCOUNTER — Ambulatory Visit
Admission: RE | Admit: 2024-06-04 | Discharge: 2024-06-04 | Disposition: A | Discharge status: Home / Self Care | Attending: Gastroenterology | Admitting: Gastroenterology

## 2024-06-04 DIAGNOSIS — D120 Benign neoplasm of cecum: Secondary | ICD-10-CM | POA: Diagnosis not present

## 2024-06-04 DIAGNOSIS — Z1211 Encounter for screening for malignant neoplasm of colon: Secondary | ICD-10-CM | POA: Insufficient documentation

## 2024-06-04 DIAGNOSIS — Z800 Family history of malignant neoplasm of digestive organs: Secondary | ICD-10-CM | POA: Insufficient documentation

## 2024-06-04 DIAGNOSIS — I10 Essential (primary) hypertension: Secondary | ICD-10-CM | POA: Insufficient documentation

## 2024-06-04 HISTORY — PX: POLYPECTOMY: SHX149

## 2024-06-04 HISTORY — PX: COLONOSCOPY: SHX5424

## 2024-06-04 SURGERY — COLONOSCOPY
Anesthesia: General

## 2024-06-04 MED ORDER — PROPOFOL 10 MG/ML IV BOLUS
INTRAVENOUS | Status: DC | PRN
  Administered 2024-06-04: 20 mg via INTRAVENOUS
  Administered 2024-06-04: 40 mg via INTRAVENOUS

## 2024-06-04 MED ORDER — LIDOCAINE HCL (CARDIAC) PF 100 MG/5ML IV SOSY
PREFILLED_SYRINGE | INTRAVENOUS | Status: DC | PRN
  Administered 2024-06-04: 60 mg via INTRAVENOUS

## 2024-06-04 MED ORDER — SODIUM CHLORIDE 0.9 % IV SOLN: INTRAVENOUS | Status: DC

## 2024-06-04 MED ORDER — LIDOCAINE HCL (PF) 2 % IJ SOLN
INTRAMUSCULAR | Status: AC
  Filled 2024-06-04: qty 5

## 2024-06-04 MED ORDER — PROPOFOL 500 MG/50ML IV EMUL
INTRAVENOUS | Status: DC | PRN
  Administered 2024-06-04: 75 ug/kg/min via INTRAVENOUS

## 2024-06-04 NOTE — Op Note (Signed)
 [facility]astroenter[facility] Patient Name: Natasha Farrell Procedure Date: 06/04/2024 1:38 PM MRN: [id number] Account #: [id number] Date of Birth: 05-23-1946 Admit Type: Outpatient Age: 79 Room: [facility] ENDO ROOM 3 Gender: Female Note Status: Finalized Instrument Name: Colon Scope [id number] Procedure:             Colonoscopy Indications:           Screening in patient at increased risk: Family history                         of 1st-degree relative with colorectal cancer Providers:             Ole Schick MD, MD Referring MD:          Eva DOROTHA Crimes, MD (Referring MD) Medicines:             Monitored Anesthesia Care Complications:         No immediate complications. Estimated blood loss:                         Minimal. Procedure:             Pre-Anesthesia Assessment:                        - Prior to the procedure, a History and Physical was                         performed, and patient medications and allergies were                         reviewed. The patient is competent. The risks and                         benefits of the procedure and the sedation options and                         risks were discussed with the patient. All questions                         were answered and informed consent was obtained.                         Patient identification and proposed procedure were                         verified by the physician, the nurse, the                         anesthesiologist, the anesthetist and the technician                         in the endoscopy suite. Mental Status Examination:                         alert and oriented. Airway Examination: normal                         oropharyngeal airway and neck mobility. Respiratory  Examination: clear to auscultation. CV Examination:                         normal. Prophylactic Antibiotics: The patient does not                         require prophylactic antibiotics.  Prior                         Anticoagulants: The patient has taken no anticoagulant                         or antiplatelet agents. ASA Grade Assessment: II - A                         patient with mild systemic disease. After reviewing                         the risks and benefits, the patient was deemed in                         satisfactory condition to undergo the procedure. The                         anesthesia plan was to use monitored anesthesia care                         (MAC). Immediately prior to administration of                         medications, the patient was re-assessed for adequacy                         to receive sedatives. The heart rate, respiratory                         rate, oxygen saturations, blood pressure, adequacy of                         pulmonary ventilation, and response to care were                         monitored throughout the procedure. The physical                         status of the patient was re-assessed after the                         procedure.                        After obtaining informed consent, the colonoscope was                         passed under direct vision. Throughout the procedure,                         the patient's blood pressure, pulse, and oxygen  saturations were monitored continuously. The was                         introduced through the anus and advanced to the the                         terminal ileum, with identification of the appendiceal                         orifice and IC valve. The colonoscopy was performed                         without difficulty. The patient tolerated the                         procedure well. The quality of the bowel preparation                         was good. The terminal ileum, ileocecal valve,                         appendiceal orifice, and rectum were photographed. Findings:      The perianal and digital rectal examinations were normal.       The terminal ileum appeared normal.      A 2 mm polyp was found in the cecum. The polyp was sessile. The polyp       was removed with a jumbo cold forceps. Resection and retrieval were       complete. Estimated blood loss was minimal.      The exam was otherwise without abnormality on direct and retroflexion       views. Impression:            - The examined portion of the ileum was normal.                        - One 2 mm polyp in the cecum, removed with a jumbo                         cold forceps. Resected and retrieved.                        - The examination was otherwise normal on direct and                         retroflexion views. Recommendation:        - Discharge patient to home.                        - Resume previous diet.                        - Continue present medications.                        - Await pathology results.                        - Repeat colonoscopy is not recommended due to current  age (45 years or older) for surveillance.                        - Return to referring physician as previously                         scheduled. Procedure Code(s):     --- Professional ---                        [phone], Colonoscopy, flexible; with biopsy, single or                         multiple Diagnosis Code(s):     --- Professional ---                        Z80.0, Family history of malignant neoplasm of                         digestive organs                        D12.0, Benign neoplasm of cecum CPT copyright 2022 American Medical Association. All rights reserved. The codes documented in this report are preliminary and upon coder review may  be revised to meet current compliance requirements. Ole Schick MD, MD 06/04/2024 2:15:59 PM Number of Addenda: 0 Note Initiated On: 06/04/2024 1:38 PM Scope Withdrawal Time: 0 hours 8 minutes 58 seconds  Total Procedure Duration: 0 hours 13 minutes 12 seconds  Estimated Blood Loss:  Estimated  blood loss was minimal.      [facility]

## 2024-06-04 NOTE — H&P (Signed)
 Outpatient short stay form Pre-procedure 06/04/2024  Natasha ONEIDA Schick, MD  Primary Physician: Marikay Eva POUR, PA  Reason for visit:  Screening  History of present illness:    79 y/o lady with history of hypothyroidism, ITP (platelets 50K), and follicular lymphoma in remission here for colonoscopy for family history of colon cancer. Sister with report of colon cancer in her 4's. No blood thinners. No significant abdominal surgeries.   Current Medications[1]  Medications Prior to Admission  Medication Sig Dispense Refill Last Dose/Taking   acetaminophen  (TYLENOL ) 500 MG tablet Take 500 mg by mouth every 6 (six) hours as needed for moderate pain, fever or headache.      carvedilol  (COREG ) 25 MG tablet TAKE 1 TABLET BY MOUTH TWICE A DAY 180 tablet 0    Cholecalciferol  (VITAMIN D3) 50 MCG (2000 UT) TABS Take by mouth daily.      cyanocobalamin 1000 MCG tablet Take 1,000 mcg by mouth daily.      levothyroxine  (SYNTHROID ) 50 MCG tablet Take 50 mcg by mouth daily before breakfast.      loratadine  (CLARITIN ) 10 MG tablet Take 10 mg by mouth daily as needed for allergies.      Multiple Vitamin (MULTIVITAMIN WITH MINERALS) TABS tablet Take 1 tablet by mouth daily.      pravastatin  (PRAVACHOL ) 40 MG tablet Take 40 mg by mouth daily.      spironolactone  (ALDACTONE ) 50 MG tablet Take 0.5 tablet (25 mg) by mouth once daily         Allergies[2]   Past Medical History:  Diagnosis Date   Arthritis    Follicular lymphoma (HCC) 2022   Hx of hysterectomy    Hyperlipidemia    Hypertension    Thyroid  disease    rt thryroidectomy  09/25/2010   Wears dentures    partial upper and lower    Review of systems:  Otherwise negative.    Physical Exam  Gen: Alert, oriented. Appears stated age.  HEENT: PERRLA. Lungs: No respiratory distress CV: RRR Abd: soft, benign, no masses Ext: No edema    Planned procedures: Proceed with colonoscopy. The patient understands the nature of the  planned procedure, indications, risks, alternatives and potential complications including but not limited to bleeding, infection, perforation, damage to internal organs and possible oversedation/side effects from anesthesia. The patient agrees and gives consent to proceed.  Please refer to procedure notes for findings, recommendations and patient disposition/instructions.     Natasha ONEIDA Schick, MD [facility]enter[facility]         [1] No current facility-administered medications for this encounter.  Facility-Administered Medications Ordered in Other Encounters:    sodium chloride  flush (NS) 0.9 % injection 10 mL, 10 mL, Intracatheter, PRN, Melanee Annah BROCKS, MD, 10 mL at 12/10/20 0830 [2]  Allergies Allergen Reactions   Lisinopril Swelling    angioedema

## 2024-06-04 NOTE — Transfer of Care (Signed)
 Immediate Anesthesia Transfer of Care Note  Patient: Natasha Farrell  Procedure(s) Performed: COLONOSCOPY POLYPECTOMY, INTESTINE  Patient Location: PACU  Anesthesia Type:General  Level of Consciousness: sedated  Airway & Oxygen Therapy: Patient Spontanous Breathing  Post-op Assessment: Report given to RN and Post -op Vital signs reviewed and stable  Post vital signs: Reviewed and stable  Last Vitals:  Vitals Value Taken Time  BP    Temp    Pulse 70 06/04/24 14:14  Resp 19 06/04/24 14:14  SpO2 98 % 06/04/24 14:14  Vitals shown include unfiled device data.  Last Pain: There were no vitals filed for this visit.       Complications: No notable events documented.

## 2024-06-04 NOTE — Interval H&P Note (Signed)
 History and Physical Interval Note:  06/04/2024 1:45 PM  Natasha Farrell  has presented today for surgery, with the diagnosis of Family history of colon cancer (Z80.0).  The various methods of treatment have been discussed with the patient and family. After consideration of risks, benefits and other options for treatment, the patient has consented to  Procedures: COLONOSCOPY (N/A) as a surgical intervention.  The patient's history has been reviewed, patient examined, no change in status, stable for surgery.  I have reviewed the patient's chart and labs.  Questions were answered to the patient's satisfaction.     Natasha Farrell  Ok to proceed with colonoscopy

## 2024-06-04 NOTE — Anesthesia Preprocedure Evaluation (Signed)
"                                    Anesthesia Evaluation  Patient identified by MRN, date of birth, ID band Patient awake    Reviewed: Allergy & Precautions, H&P , NPO status , Patient's Chart, lab work & pertinent test results  Airway Mallampati: II  TM Distance: >3 FB Neck ROM: Full    Dental no notable dental hx. (+) Upper Dentures, Lower Dentures   Pulmonary neg pulmonary ROS   Pulmonary exam normal breath sounds clear to auscultation       Cardiovascular hypertension, negative cardio ROS Normal cardiovascular exam Rhythm:Regular Rate:Normal     Neuro/Psych negative neurological ROS  negative psych ROS   GI/Hepatic negative GI ROS, Neg liver ROS,,,  Endo/Other  negative endocrine ROS    Renal/GU negative Renal ROS  negative genitourinary   Musculoskeletal negative musculoskeletal ROS (+)    Abdominal   Peds negative pediatric ROS (+)  Hematology negative hematology ROS (+)   Anesthesia Other Findings   Reproductive/Obstetrics negative OB ROS                              Anesthesia Physical Anesthesia Plan  ASA: 2  Anesthesia Plan: General   Post-op Pain Management:    Induction: Intravenous  PONV Risk Score and Plan:   Airway Management Planned:   Additional Equipment:   Intra-op Plan:   Post-operative Plan: Extubation in OR  Informed Consent: I have reviewed the patients History and Physical, chart, labs and discussed the procedure including the risks, benefits and alternatives for the proposed anesthesia with the patient or authorized representative who has indicated his/her understanding and acceptance.     Dental advisory given  Plan Discussed with: CRNA  Anesthesia Plan Comments:         Anesthesia Quick Evaluation  "

## 2024-06-04 NOTE — Anesthesia Postprocedure Evaluation (Signed)
"   Anesthesia Post Note  Patient: Natasha Farrell  Procedure(s) Performed: COLONOSCOPY POLYPECTOMY, INTESTINE  Patient location during evaluation: Endoscopy Anesthesia Type: General Level of consciousness: awake and alert Pain management: pain level controlled Vital Signs Assessment: post-procedure vital signs reviewed and stable Respiratory status: spontaneous breathing, nonlabored ventilation, respiratory function stable and patient connected to nasal cannula oxygen Cardiovascular status: blood pressure returned to baseline and stable Postop Assessment: no apparent nausea or vomiting Anesthetic complications: no   No notable events documented.   Last Vitals: There were no vitals filed for this visit.  Last Pain: There were no vitals filed for this visit.               [employer] [facility] [place]      "

## 2024-06-05 LAB — SURGICAL PATHOLOGY

## 2024-06-09 ENCOUNTER — Other Ambulatory Visit: Payer: Self-pay | Admitting: Internal Medicine

## 2024-06-28 ENCOUNTER — Encounter: Payer: Self-pay | Admitting: Internal Medicine

## 2024-06-28 ENCOUNTER — Ambulatory Visit: Admitting: Internal Medicine

## 2024-06-28 VITALS — BP 120/70 | HR 76 | Ht 64.0 in | Wt 183.5 lb

## 2024-06-28 DIAGNOSIS — I4729 Other ventricular tachycardia: Secondary | ICD-10-CM | POA: Diagnosis not present

## 2024-06-28 DIAGNOSIS — R000 Tachycardia, unspecified: Secondary | ICD-10-CM | POA: Diagnosis not present

## 2024-06-28 DIAGNOSIS — I10 Essential (primary) hypertension: Secondary | ICD-10-CM | POA: Diagnosis not present

## 2024-06-28 DIAGNOSIS — R55 Syncope and collapse: Secondary | ICD-10-CM | POA: Diagnosis not present

## 2024-06-28 DIAGNOSIS — Z79899 Other long term (current) drug therapy: Secondary | ICD-10-CM | POA: Diagnosis not present

## 2024-06-28 NOTE — Patient Instructions (Addendum)
 Medication Instructions:  Your physician recommends that you continue on your current medications as directed. Please refer to the Current Medication list given to you today.   *If you need a refill on your cardiac medications before your next appointment, please call your pharmacy*  Lab Work: Your provider would like for you to have following labs drawn today BMP.   If you have labs (blood work) drawn today and your tests are completely normal, you will receive your results only by: MyChart Message (if you have MyChart) OR A paper copy in the mail If you have any lab test that is abnormal or we need to change your treatment, we will call you to review the results.  Testing/Procedures: Your provider has ordered a exercise tolerance test. This test will evaluate the blood supply to your heart muscle during periods of exercise and rest. For this test, you will raise your heart rate by walking on a treadmill at different levels. This will take place at [address] [facility])  [facility] [phone]  Please arrive 15 minutes prior to your appointment time for registration  The test will take approximately 45 minutes to complete.  Instructions: 1. Beta blockers ( Carvedilol ) should be held for 24 hours prior to testing. Otherwise, you may take  your medications the morning of the test.  2. Light breakfast and/or lunch  3. Dress prepared to exercise. Please wear a 2-piece outfit (no dresses, overalls, etc),  and wear shoes appropriate for walking (must be closed toe and heel)  4. Do not wear cologne, perfume, aftershave or lotions (deodorant is allowed).  5. If you use an inhaler, bring it with you to the test.  6. No caffeine for 24 hours prior to your test (coffee, tea, soft drinks, or chocolate)   7. No smoking/ vaping for 4 hours prior to your test  Please note: If anyone comes with you to the appointment, they will need to remain in the  main lobby due to limited  space in the testing area. We also ask at that you not bring  children with you during testing. Due to room size and safety concerns, children are not  allowed in the testing rooms during exams.     Follow-Up: At [facility], you and your health needs are our priority.  As part of our continuing mission to provide you with exceptional heart care, our providers are all part of one team.  This team includes your primary Cardiologist (physician) and Advanced Practice Providers or APPs (Physician Assistants and Nurse Practitioners) who all work together to provide you with the care you need, when you need it.  Your next appointment:   6 month(s)  Provider:   You may see Lonni Hanson, MD or one of the following Advanced Practice Providers on your designated Care Team:   Lonni Meager, NP Lesley Maffucci, PA-C Bernardino Bring, PA-C Cadence [place], PA-C Tylene Lunch, NP Barnie Hila, NP    We recommend signing up for the patient portal called MyChart.  Sign up information is provided on this After Visit Summary.  MyChart is used to connect with patients for Virtual Visits (Telemedicine).  Patients are able to view lab/test results, encounter notes, upcoming appointments, etc.  Non-urgent messages can be sent to your provider as well.   To learn more about what you can do with MyChart, go to [email or web address].

## 2024-06-28 NOTE — Progress Notes (Signed)
 " Cardiology Office Note:  .   Date:  06/28/2024  ID:  Natasha Farrell, DOB 1945-09-21, MRN [id number] PCP: Natasha Eva POUR, PA   HeartCare Providers Cardiologist:  Natasha Hanson, MD     History of Present Illness: .   Natasha Farrell is a 79 y.o. female with history of sinus tachycardia, hypertension, hyperlipidemia, follicular lymphoma, and thyroid  disease status post right thyroidectomy, who presents for follow-up of syncope, tachycardia, and hypertension.  I last saw her in early November, at which time she was feeling well though she mention an episode of syncope that occurred in August.  She had been feeling well but passed out without warning, leading to a toe fracture.  Echocardiogram around that time did not show any significant abnormalities to explain her syncope.  We obtained a 28-day event monitor, which was notable for rare PACs and PVCs and 3 episodes of NSVT lasting up to 4 seconds.  Today, Natasha Farrell reports that she has been feeling well without any further syncope.  She notices rare flutters in her chest, happening once every 3 to 4 weeks without associated symptoms.  She denies chest pain, shortness of breath, lightheadedness, and edema.  She had been walking regularly (3 to 4 miles a day), though this has been on hold with the winter weather.  Her weight has increased compared to last summer, which she attributes to having been unable to walk after her toe fracture in August.  ROS: See HPI  Studies Reviewed: .        28-day event monitor (05/08/2024): Predominantly sinus rhythm with rare PACs and PVCs.  3 episodes of NSVT noted, lasting up to 4 seconds with a maximum rate of 207 bpm. Risk Assessment/Calculations:             Physical Exam:   VS:  BP 120/70 (BP Location: Left Arm, Patient Position: Sitting, Cuff Size: Normal)   Pulse 76   Ht 5' 4 (1.626 m)   Wt 183 lb 8 oz (83.2 kg)   SpO2 98%   BMI 31.50 kg/m    Wt Readings from Last 3 Encounters:   06/28/24 183 lb 8 oz (83.2 kg)  03/28/24 183 lb 6.4 oz (83.2 kg)  02/24/24 184 lb (83.5 kg)    General:  NAD. Neck: No JVD or HJR. Lungs: Clear to auscultation bilaterally without wheezes or crackles. Heart: Regular rate and rhythm without murmurs, rubs, or gallops. Abdomen: Soft, nontender, nondistended. Extremities: No lower extremity edema.  ASSESSMENT AND PLAN: .    Syncope and NSVT: Rare sporadic palpitations noted with prior event monitor showing rare PACs and PVCs as well as a few episodes of NSVT lasting up to 4 seconds.  We have agreed to obtain an exercise tolerance test to exclude ischemia or exercise-induced ventricular ectopy.  No medication changes at this time.  Sinus tachycardia: Heart rate well-controlled on carvedilol  today.  Continue 25 mg twice daily.  Hypertension: Blood pressure well-controlled today.  No medication changes at this time.  Check BMP today with long-term spironolactone  use.    Informed Consent   Shared Decision Making/Informed Consent The risks [chest pain, shortness of breath, cardiac arrhythmias, dizziness, blood pressure fluctuations, myocardial infarction, stroke/transient ischemic attack, and life-threatening complications (estimated to be 1 in 10,000)], benefits (risk stratification, diagnosing coronary artery disease, treatment guidance) and alternatives of an exercise tolerance test were discussed in detail with Natasha Farrell and she agrees to proceed.     Dispo: Return to clinic  in 6 months.  Signed, Natasha Hanson, MD  "

## 2024-06-29 LAB — BASIC METABOLIC PANEL WITH GFR
BUN/Creatinine Ratio: 7 — ABNORMAL LOW (ref 12–28)
BUN: 6 mg/dL — ABNORMAL LOW (ref 8–27)
CO2: 24 mmol/L (ref 20–29)
Calcium: 10.4 mg/dL — ABNORMAL HIGH (ref 8.7–10.3)
Chloride: 98 mmol/L (ref 96–106)
Creatinine, Ser: 0.9 mg/dL (ref 0.57–1.00)
Glucose: 94 mg/dL (ref 70–99)
Potassium: 4.5 mmol/L (ref 3.5–5.2)
Sodium: 139 mmol/L (ref 134–144)
eGFR: 66 mL/min/{1.73_m2}

## 2024-08-24 ENCOUNTER — Other Ambulatory Visit

## 2024-08-29 ENCOUNTER — Ambulatory Visit

## 2024-09-07 ENCOUNTER — Other Ambulatory Visit

## 2024-09-07 ENCOUNTER — Ambulatory Visit: Admitting: Oncology
